# Patient Record
Sex: Female | Born: 1960 | Race: White | Hispanic: No
Health system: Midwestern US, Community
[De-identification: ages and names within clinical notes are randomized; demographics above are authoritative.]

## PROBLEM LIST (undated history)

## (undated) DIAGNOSIS — I5181 Takotsubo syndrome: Secondary | ICD-10-CM

## (undated) DIAGNOSIS — K219 Gastro-esophageal reflux disease without esophagitis: Secondary | ICD-10-CM

## (undated) DIAGNOSIS — J189 Pneumonia, unspecified organism: Secondary | ICD-10-CM

## (undated) DIAGNOSIS — I480 Paroxysmal atrial fibrillation: Secondary | ICD-10-CM

## (undated) DIAGNOSIS — I2510 Atherosclerotic heart disease of native coronary artery without angina pectoris: Secondary | ICD-10-CM

## (undated) DIAGNOSIS — F419 Anxiety disorder, unspecified: Secondary | ICD-10-CM

## (undated) DIAGNOSIS — F32A Depression, unspecified: Secondary | ICD-10-CM

## (undated) DIAGNOSIS — F1910 Other psychoactive substance abuse, uncomplicated: Secondary | ICD-10-CM

## (undated) HISTORY — PX: HERNIA REPAIR: SHX51

## (undated) HISTORY — PX: OTHER SURGICAL HISTORY: SHX169

## (undated) HISTORY — DX: Depression, unspecified: F32.A

## (undated) HISTORY — PX: CHOLECYSTECTOMY: SHX55

## (undated) HISTORY — DX: Anxiety disorder, unspecified: F41.9

## (undated) HISTORY — PX: WISDOM TOOTH EXTRACTION: SHX21

## (undated) HISTORY — PX: COLON RESECTION: SHX5231

## (undated) HISTORY — PX: MENISCUS REPAIR: SHX5179

---
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
## 2016-06-02 NOTE — Nursing Note (Signed)
Orthostatics - Text       Orthostatics Entered On:  06/02/2016 17:29 EDT    Performed On:  06/02/2016 17:26 EDT by Leanna BattlesMALONE, RN, JENNIFER L               Orthostatics   Systolic/Diastolic  Supine BP :   104 mmHg   Systolic/Diastolic  Supine BP :   56 mmHg (LOW)    Pulse Supine :   58 bpm (LOW)    Systolic/Diastolic  Sitting BP :   102 mmHg   Systolic/Diastolic  Sitting BP :   61 mmHg   Pulse Sitting :   63 bpm   MALONE, RN, JENNIFER L - 06/02/2016 17:26 EDT

## 2016-06-03 NOTE — Nursing Note (Signed)
Nursing Discharge Summary - Text       Nursing Discharge Summary Entered On:  06/03/2016 12:07 EDT    Performed On:  06/03/2016 12:06 EDT by Vinetta BergamoSTROCK, RN, Soyla DryerKRISTY M               DC Information   Discharge To, Anticipated :   Home with family support   Mode of Discharge :   Ambulatory   Transportation :   Private vehicle   Accompanied By :   Shela LeffFriend   STROCK, RN, KRISTY M - 06/03/2016 12:06 EDT   Education   Responsible Learner(s) :   Living Situation: Home independently        Performed by: Lanna PocheATWELL,  DOREEN A - 06/03/2016 07:03  Discharge To: Home independently, Other: private psychiatrist        Performed by: Lanna PocheATWELL,  DOREEN A - 06/03/2016 09:44     Barriers To Learning :   None evident   Teaching Method :   Explanation, Printed materials, Teach-back   STROCK, RN, Soyla DryerKRISTY M - 06/03/2016 12:06 EDT   Post-Hospital Education Adult Grid   Community Resources :   [employer] Squ[employer]bVerbalizes understanding   Disease Process :   Verbalizes understanding   Importance of Follow-Up Visits :   Verbalizes understanding   Plan of Care :   Verbalizes understanding   When to Call Health Care Provider :   BuchananVerbalizes understanding   NumaSTROCK, RN, Soyla DryerKRISTY M - 06/03/2016 12:06 EDT   Education Referral Made To :   Physician Specialist   Vinetta BergamoSTROCK, RN, KRISTY M - 06/03/2016 12:06 EDT

## 2016-06-03 NOTE — Case Communication (Signed)
 CM Discharge Planning Assessment - Text       CM Discharge Planning Assessment Entered On:  06/03/2016 7:12 EDT    Performed On:  06/03/2016 7:03 EDT by BERNETT GUT A               Home Environment   Living Environment :   No Living Environment Information Available     Affect/Behavior :   Cooperative   Living Situation :   Home independently   Special Serv & Comm Res, Anticipated :   Private Psychiatrist   Barriers at Home :   None   ATWELL,  DOREEN A - 06/03/2016 7:03 EDT   Home Environment   ADLs :   Independent   BERNETT GUT A - 06/03/2016 7:03 EDT   Discharge Needs I   Previously Documented Discharge Needs :   DISCHARGE PLAN/NEEDS:No discharge data available.  EQUIPMENT/TREATMENT NEEDS:No discharge data available.     Previously Documented Benefits Information :   No discharge data available.     CM Progress Note :   daa- 06/02/16 CM requested to assist with care needs. Pt. is alert and oriented, her speech is slurred and appears to be altered, taking multiple prescribed meds, denies taking an OD of any medications and that she is not suicidal nor has she been. She denies any HI, no A/VH or other psychotic sx. Pt. was very unbalanced and difficulty walking coming into the ER. She has been resting since and had to be awakened during this assessment. She came by sheriff from her psychiatrist office on both part 1 and part 2 committment papers completed. Papers note that pt. is not caring for herself and had fallen in the office parking lot prior to getting into the doctors office. No note of suicidality  and pt. denies. it is difficult to assess her due to her speech and ? influence of polysubstance use. She was given dinner tray which she did not want and she requested grilled chicken and grilled vegetables. Pt. is not forthright with information relating to her committment or sx. Info relayed to Dr. Lesia, dispo to be determined after seen by psychiatrist. I contacted [facility] since reference was made that  pt. was only here for medical clearence and than go to [facility]. [facility] Doug) is unaware of this pt. nor have any orders for adm.      BERNETT GUT A - 06/03/2016 7:03 EDT   Discharge Needs II   Discharge Planning Time Spent :   35 minutes   ATWELL,  DOREEN A - 06/03/2016 7:03 EDT   Advance Directive   *Advance Directive :   No   ATWELL,  DOREEN A - 06/03/2016 7:03 EDT

## 2016-06-03 NOTE — Case Communication (Signed)
 CM Discharge Planning Assessment - Text       CM Discharge Planning Ongoing Assessment Entered On:  06/03/2016 9:46 EDT    Performed On:  06/03/2016 9:44 EDT by BERNETT GUT A               Discharge Needs I   Previously Documented Discharge Needs :   DISCHARGE PLAN/NEEDS:  EQUIPMENT/TREATMENT NEEDS:       Previously Documented Benefits Information :   No discharge data available.     Discharge To :   Home independently, Other: private psychiatrist   CM Progress Note :   daa- 06/02/16 CM requested to assist with care needs. Pt. is alert and oriented, her speech is slurred and appears to be altered, taking multiple prescribed meds, denies taking an OD of any medications and that she is not suicidal nor has she been. She denies any HI, no A/VH or other psychotic sx. Pt. was very unbalanced and difficulty walking coming into the ER. She has been resting since and had to be awakened during this assessment. She came by sheriff from her psychiatrist office on both part 1 and part 2 committment papers completed. Papers note that pt. is not caring for herself and had fallen in the office parking lot prior to getting into the doctors office. No note of suicidality  and pt. denies. it is difficult to assess her due to her speech and ? influence of polysubstance use. She was given dinner tray which she did not want and she requested grilled chicken and grilled vegetables. Pt. is not forthright with information relating to her committment or sx. Info relayed to Dr. Lesia, dispo to be determined after seen by psychiatrist. I contacted [facility] since reference was made that pt. was only here for medical clearence and than go to [facility]. [facility] Doug) is unaware of this pt. nor have any orders for adm.   daa- Pt. was seen by Dr. Deanna, committment papers were cancelled, and he relayed to Dr. Daivd that if pt. eats 2 hard boiled eggs she can d/c home and will f/u with Dr. Marsa on outpt. basis.     ATWELL,  DOREEN A -  06/03/2016 9:44 EDT

## 2019-11-25 ENCOUNTER — Telehealth: Payer: Self-pay | Admitting: Family Medicine

## 2019-11-25 ENCOUNTER — Ambulatory Visit (INDEPENDENT_AMBULATORY_CARE_PROVIDER_SITE_OTHER): Payer: BLUE CROSS/BLUE SHIELD | Admitting: Family Medicine

## 2019-11-25 ENCOUNTER — Other Ambulatory Visit: Payer: Self-pay

## 2019-11-25 ENCOUNTER — Encounter: Payer: Self-pay | Admitting: Family Medicine

## 2019-11-25 VITALS — BP 88/56 | HR 77 | Temp 97.9°F | Ht 62.75 in | Wt 91.8 lb

## 2019-11-25 DIAGNOSIS — Z7689 Persons encountering health services in other specified circumstances: Secondary | ICD-10-CM

## 2019-11-25 DIAGNOSIS — I959 Hypotension, unspecified: Secondary | ICD-10-CM | POA: Diagnosis not present

## 2019-11-25 DIAGNOSIS — F3340 Major depressive disorder, recurrent, in remission, unspecified: Secondary | ICD-10-CM | POA: Diagnosis not present

## 2019-11-25 DIAGNOSIS — Z1231 Encounter for screening mammogram for malignant neoplasm of breast: Secondary | ICD-10-CM | POA: Diagnosis not present

## 2019-11-25 MED ORDER — QUETIAPINE FUMARATE 100 MG PO TABS: 100.0000 mg | ORAL_TABLET | Freq: Three times a day (TID) | ORAL | 0 refills | Status: DC

## 2019-11-25 MED ORDER — TRAZODONE HCL 50 MG PO TABS: 50.0000 mg | ORAL_TABLET | Freq: Two times a day (BID) | ORAL | 0 refills | Status: DC

## 2019-11-25 MED ORDER — TRAZODONE HCL 300 MG PO TABS: 300.0000 mg | ORAL_TABLET | Freq: Every day | ORAL | 0 refills | Status: DC

## 2019-11-25 NOTE — Progress Notes (Signed)
   Subjective:    Patient ID: Carla Martinez, female    DOB: 10-11-1961, 59 y.o.   MRN: [id number]  HPI Chief Complaint  Patient presents with  . New Patient (Initial Visit)    Moved here from [place], [place], was seen by Carla Coffin, MD and Dr Carla Martinez Psychiatrist.    She moved her recently, lives with her mother. Enjoys Therapist, art, exercise. Has 4 grandchildren.   Last CPE- Mammo- several years ago Pap- several years ago Colonoscopy- has had several, polyps removed, 3-5 yo follow up, unsure when last was Tdap- unknown Flu- unknown   Severe depression- 3 years ago after bad divorce, was put in assisted living. Was on many medications but currently just taking Seroquel and trazadone. Mood is "much better." She is functioning well. Poor sleep, takes trazadone 300 mg. Has difficulty staying asleep. Previously on naltrexone, paroxetine, buspirone, and mirtazapine according to [facility] from assisted living.     Review of Systems No headaches, no visual changes, no chest pain/ SOB, no abdominal pain, chronic constipation, good results with occasional laxative. No muscle or joint pain.     Objective:   Physical Exam Vitals reviewed.  Constitutional:      General: She is not in acute distress.    Appearance: Normal appearance. She is not ill-appearing, toxic-appearing or diaphoretic.     Comments: Thin.   HENT:     Head: Normocephalic and atraumatic.  Eyes:     Conjunctiva/sclera: Conjunctivae normal.  Cardiovascular:     Rate and Rhythm: Normal rate.  Pulmonary:     Effort: Pulmonary effort is normal.  Neurological:     Mental Status: She is alert and oriented to person, place, and time.  Psychiatric:        Mood and Affect: Mood normal.        Behavior: Behavior normal.        Thought Content: Thought content normal.        Judgment: Judgment normal.     BP (!) 88/56 (BP Location: Left Arm, Patient Position: Sitting, Cuff Size: Normal) Comment: pt states this is  normal for her  Pulse 77   Temp 97.9 F (36.6 C) (Temporal)   Ht 5' 2.75" (1.594 m)   Wt 91 lb 12.8 oz (41.6 kg)   SpO2 95%   BMI 16.39 kg/m      Assessment & Plan:  1. Encounter to establish care - unfortunately, unable to get records, patient unsure of prior providers - she was provided with information to schedule mammogram - follow up in 3 months for CPE/ labs  2. Recurrent major depressive disorder, in remission [facility]) - will have her see psychiatry. Depression improved according to patient but still scoring 11 on PHQ9.  - Ambulatory referral to Psychiatry - traZODone (DESYREL) 50 MG tablet; Take 1 tablet (50 mg total) by mouth 2 (two) times daily.  Dispense: 180 tablet; Refill: 0 - QUEtiapine (SEROQUEL) 100 MG tablet; Take 1 tablet (100 mg total) by mouth 3 (three) times daily.  Dispense: 270 tablet; Refill: 0 - traZODone (DESYREL) 300 MG tablet; Take 1 tablet (300 mg total) by mouth at bedtime. Take 100mg  BID and 300mg  at bedtime.  Dispense: 90 tablet; Refill: 0  3. Hypotension, unspecified hypotension type - no symptoms, per patient she always runs low   Clarene Reamer, FNP-BC  [facility], [place]  11/25/2019 12:56 PM

## 2019-11-25 NOTE — Telephone Encounter (Signed)
Carla Martinez, At the [facility] called in regards to the 2 trazodone prescriptions they received . They are requesting a call back to clarify the instructions and doses for both prescriptions   C/B [phone]

## 2019-11-25 NOTE — Patient Instructions (Addendum)
Please call and schedule an appointment for screening mammogram. A referral is not needed.  [phone]  Please schedule a follow up appointment for complete physical for 3 months

## 2019-11-25 NOTE — Telephone Encounter (Signed)
[place] and gave clarification of RX Pt takes 300mg  at bedtime and 50mg  BID  Meds corrected in chart.   Nothing further needed.

## 2020-02-20 ENCOUNTER — Other Ambulatory Visit: Payer: Self-pay

## 2020-02-20 DIAGNOSIS — F3340 Major depressive disorder, recurrent, in remission, unspecified: Secondary | ICD-10-CM

## 2020-02-20 NOTE — Telephone Encounter (Signed)
Please call patient and verify if she is out of medication? Prescriptions for 90 day supplies were sent in on 11/25/19. She should have enough until 02/23/20. She has an appointment with me on 02/22/20.

## 2020-02-20 NOTE — Telephone Encounter (Signed)
Patient contacted the office and is requesting a refill on Trazodone 50mg , Trazodone 300mg , and Seroquel 100mg . Rx's were last refilled on 11/25/19 - Patient was last seen in the office on 11/25/19 - and has an upcoming CPE on 02/22/20.  Ok to refill?

## 2020-02-20 NOTE — Telephone Encounter (Signed)
Patient called back about refill request She stated she is completely out of medication and would like a call once it is sent

## 2020-02-21 MED ORDER — TRAZODONE HCL 50 MG PO TABS: 50.0000 mg | ORAL_TABLET | Freq: Two times a day (BID) | ORAL | 0 refills | Status: DC

## 2020-02-21 MED ORDER — QUETIAPINE FUMARATE 100 MG PO TABS: 100.0000 mg | ORAL_TABLET | Freq: Three times a day (TID) | ORAL | 0 refills | Status: DC

## 2020-02-21 MED ORDER — TRAZODONE HCL 300 MG PO TABS: 300.0000 mg | ORAL_TABLET | Freq: Every day | ORAL | 0 refills | Status: DC

## 2020-02-21 NOTE — Telephone Encounter (Signed)
Pt states that she ran out of the Trazodone 300mg  Sunday night.  The Seroquel 100mg  and Trazodone 50mg  she only has a few tablets left.   Pt had to cancel the appt for 02/22/20 d/t not having her insurance - she is still waiting on her insurance information/card to come since January.   Pt is requesting that these be filled at least for a short period (30 day supply) until she can establish insurance and reschedule her CPE with Debbie.

## 2020-02-22 ENCOUNTER — Encounter: Payer: BLUE CROSS/BLUE SHIELD | Admitting: Family Medicine

## 2020-03-20 ENCOUNTER — Other Ambulatory Visit: Payer: Self-pay

## 2020-03-20 DIAGNOSIS — F3340 Major depressive disorder, recurrent, in remission, unspecified: Secondary | ICD-10-CM

## 2020-03-20 MED ORDER — TRAZODONE HCL 300 MG PO TABS: 300.0000 mg | ORAL_TABLET | Freq: Every day | ORAL | 0 refills | Status: DC

## 2020-03-20 MED ORDER — QUETIAPINE FUMARATE 100 MG PO TABS: 100.0000 mg | ORAL_TABLET | Freq: Three times a day (TID) | ORAL | 0 refills | Status: DC

## 2020-03-20 MED ORDER — TRAZODONE HCL 50 MG PO TABS: 50.0000 mg | ORAL_TABLET | Freq: Two times a day (BID) | ORAL | 0 refills | Status: DC

## 2020-03-20 NOTE — Telephone Encounter (Signed)
Patient contacted the office and states she needs a refill on her Trazadone and her Seroquel. She states that she is still battling with her insurance company about getting her information and all, and she just got out of a guardianship/conservatorship situation - and she is unable to make an appt right now.  She would like these sent in to [facility] on [address].

## 2020-03-22 NOTE — Telephone Encounter (Signed)
LM for return call

## 2020-04-13 ENCOUNTER — Ambulatory Visit: Payer: BLUE CROSS/BLUE SHIELD | Admitting: Family Medicine

## 2020-04-18 ENCOUNTER — Other Ambulatory Visit: Payer: Self-pay | Admitting: Family Medicine

## 2020-04-18 DIAGNOSIS — F3340 Major depressive disorder, recurrent, in remission, unspecified: Secondary | ICD-10-CM

## 2020-04-19 NOTE — Telephone Encounter (Signed)
Pt has an appt 04/20/20  Will send to [facility] to address at [place]

## 2020-04-20 ENCOUNTER — Other Ambulatory Visit: Payer: Self-pay

## 2020-04-20 ENCOUNTER — Ambulatory Visit (INDEPENDENT_AMBULATORY_CARE_PROVIDER_SITE_OTHER): Payer: 59 | Admitting: Family Medicine

## 2020-04-20 ENCOUNTER — Encounter: Payer: Self-pay | Admitting: Family Medicine

## 2020-04-20 VITALS — BP 90/58 | HR 87

## 2020-04-20 DIAGNOSIS — R636 Underweight: Secondary | ICD-10-CM | POA: Diagnosis not present

## 2020-04-20 DIAGNOSIS — F330 Major depressive disorder, recurrent, mild: Secondary | ICD-10-CM | POA: Insufficient documentation

## 2020-04-20 DIAGNOSIS — F1011 Alcohol abuse, in remission: Secondary | ICD-10-CM

## 2020-04-20 DIAGNOSIS — F3340 Major depressive disorder, recurrent, in remission, unspecified: Secondary | ICD-10-CM | POA: Diagnosis not present

## 2020-04-20 DIAGNOSIS — F322 Major depressive disorder, single episode, severe without psychotic features: Secondary | ICD-10-CM | POA: Insufficient documentation

## 2020-04-20 MED ORDER — TRAZODONE HCL 300 MG PO TABS: 300.0000 mg | ORAL_TABLET | Freq: Every day | ORAL | 1 refills | Status: DC

## 2020-04-20 MED ORDER — TRAZODONE HCL 50 MG PO TABS: 50.0000 mg | ORAL_TABLET | Freq: Two times a day (BID) | ORAL | 1 refills | Status: DC
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
## 2020-04-20 NOTE — Patient Instructions (Addendum)
Please call and schedule an appointment for screening mammogram. A referral is not needed.  [phone]  Try a stool softener (Colace) for constipation

## 2020-05-03 ENCOUNTER — Telehealth: Payer: Self-pay

## 2020-05-03 NOTE — Telephone Encounter (Signed)
Pt needs to know where she can go to see a certified addiction specialist for a driving assessment. She has called several places but has had no luck. Please advise at [phone]

## 2020-05-03 NOTE — Telephone Encounter (Signed)
I recommend she call [facility] where the requirements have originated and see if they can give her any guidance. From the documentation that she had at her recent visit, it was very specific with the type of professional she needs to be evaluated by.

## 2020-05-07 NOTE — Telephone Encounter (Signed)
Noted  

## 2020-05-07 NOTE — Telephone Encounter (Signed)
FYI, the patient is going to do this virtually with her Psychiatrist from [facility]   Nothing further needed.

## 2020-06-16 ENCOUNTER — Other Ambulatory Visit: Payer: Self-pay | Admitting: Family Medicine

## 2020-06-16 DIAGNOSIS — F3340 Major depressive disorder, recurrent, in remission, unspecified: Secondary | ICD-10-CM

## 2020-06-19 MED ORDER — QUETIAPINE FUMARATE 100 MG PO TABS: 100.0000 mg | ORAL_TABLET | Freq: Three times a day (TID) | ORAL | 0 refills | Status: DC

## 2020-06-19 NOTE — Telephone Encounter (Signed)
Pt states she also needs a refill on her Seroquel.

## 2020-06-22 ENCOUNTER — Ambulatory Visit: Payer: BLUE CROSS/BLUE SHIELD | Admitting: Family Medicine

## 2020-07-09 ENCOUNTER — Telehealth: Payer: Self-pay | Admitting: Family Medicine

## 2020-07-09 NOTE — Telephone Encounter (Signed)
Patient called in stating she would like to discuss mammogram order/location. Please advise.

## 2020-07-10 NOTE — Telephone Encounter (Signed)
Called pt and left message on her vmail to call office and ask to speak to Santiago Glad.

## 2020-07-10 NOTE — Telephone Encounter (Signed)
Pt returned your call Best number [phone]

## 2020-07-10 NOTE — Telephone Encounter (Signed)
Noted  

## 2020-07-10 NOTE — Telephone Encounter (Signed)
I attempted to reach patient to discuss this. Lvm asking her to call the office.

## 2020-07-10 NOTE — Telephone Encounter (Signed)
Please call her and find out what her questions are. Does she need a different location?

## 2020-07-10 NOTE — Telephone Encounter (Signed)
Pt called in wanting to know if she needed a referral. I informed pt that she did not need a referral and I provided her with the name and phone number to [facility].    Pt also have concerns about having to pay her bill in full because she received a bill from [employer] stating that insurance didn't cover. Pt stated that she spoke to [employer] and they informed her that her deductible is $50, her bill is covered by [employer].  I verified the insurance company and will route to [employer].

## 2020-07-22 ENCOUNTER — Other Ambulatory Visit: Payer: Self-pay | Admitting: Family Medicine

## 2020-07-22 DIAGNOSIS — F3340 Major depressive disorder, recurrent, in remission, unspecified: Secondary | ICD-10-CM

## 2020-07-24 NOTE — Telephone Encounter (Signed)
Patient is completely out of seroquel, trazodone 300mg  and 50mg . She needs this medication called today.

## 2020-07-25 MED ORDER — QUETIAPINE FUMARATE 100 MG PO TABS: 100.0000 mg | ORAL_TABLET | Freq: Three times a day (TID) | ORAL | 0 refills | Status: DC

## 2020-07-25 NOTE — Telephone Encounter (Signed)
Last appt 04/20/2020 AVS says for pt to f/u 3 to 4 months CPE.  No future appt. Last refill 06/19/2020.  Refill?

## 2020-07-25 NOTE — Telephone Encounter (Signed)
My error. Last prescription was for 30 day supply.

## 2020-07-25 NOTE — Addendum Note (Signed)
Addended by: Carter Kitten on: 07/25/2020 03:48 PM   Modules accepted: Orders

## 2020-07-25 NOTE — Telephone Encounter (Signed)
Carla Martinez, Patient takes Seroquel 3 times a day so she would be due for refill?  Please advise.

## 2020-07-25 NOTE — Telephone Encounter (Signed)
Please call patient and tell her that she should still have seroquel. I sent in a 90 day supply at the end of August. I am sending in a one month prescription for her trazodone, she needs a follow up appointment. Please schedule her.

## 2020-07-26 NOTE — Telephone Encounter (Signed)
Left message for Carla Martinez that Carla Martinez refilled her medication but she is due for a follow up.  I ask that she call the office to schedule appointment prior to her next refill.

## 2020-08-01 ENCOUNTER — Ambulatory Visit: Payer: 59 | Admitting: Family Medicine

## 2020-08-17 ENCOUNTER — Ambulatory Visit (INDEPENDENT_AMBULATORY_CARE_PROVIDER_SITE_OTHER): Payer: 59 | Admitting: Family Medicine

## 2020-08-17 ENCOUNTER — Encounter: Payer: Self-pay | Admitting: Family Medicine

## 2020-08-17 ENCOUNTER — Other Ambulatory Visit: Payer: Self-pay

## 2020-08-17 VITALS — BP 82/64 | HR 66 | Temp 97.8°F | Ht 62.5 in | Wt 92.0 lb

## 2020-08-17 DIAGNOSIS — Z1231 Encounter for screening mammogram for malignant neoplasm of breast: Secondary | ICD-10-CM

## 2020-08-17 DIAGNOSIS — R636 Underweight: Secondary | ICD-10-CM | POA: Diagnosis not present

## 2020-08-17 DIAGNOSIS — I959 Hypotension, unspecified: Secondary | ICD-10-CM | POA: Diagnosis not present

## 2020-08-17 DIAGNOSIS — F3340 Major depressive disorder, recurrent, in remission, unspecified: Secondary | ICD-10-CM | POA: Diagnosis not present

## 2020-08-17 MED ORDER — TRAZODONE HCL 300 MG PO TABS: 300.0000 mg | ORAL_TABLET | Freq: Every day | ORAL | 1 refills | Status: DC

## 2020-08-17 MED ORDER — QUETIAPINE FUMARATE 100 MG PO TABS
100.0000 mg | ORAL_TABLET | Freq: Three times a day (TID) | ORAL | 1 refills | Status: DC
Start: 2020-08-17 — End: 2021-03-21

## 2020-08-17 MED ORDER — TRAZODONE HCL 50 MG PO TABS
50.0000 mg | ORAL_TABLET | Freq: Two times a day (BID) | ORAL | 1 refills | Status: DC
Start: 2020-08-17 — End: 2021-03-14

## 2020-08-17 NOTE — Patient Instructions (Signed)
Good to see you today  Please schedule mammogram  Hang in there with everything you have going on
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
## 2020-08-18 LAB — COMPREHENSIVE METABOLIC PANEL
AG Ratio: 2 (calc) (ref 1.0–2.5)
ALT: 15 U/L (ref 6–29)
AST: 17 U/L (ref 10–35)
Albumin: 4.3 g/dL (ref 3.6–5.1)
Alkaline phosphatase (APISO): 65 U/L (ref 37–153)
BUN/Creatinine Ratio: 15 (calc) (ref 6–22)
BUN: 19 mg/dL (ref 7–25)
CO2: 31 mmol/L (ref 20–32)
Calcium: 9.8 mg/dL (ref 8.6–10.4)
Chloride: 93 mmol/L — ABNORMAL LOW (ref 98–110)
Creat: 1.3 mg/dL — ABNORMAL HIGH (ref 0.50–1.05)
Globulin: 2.2 g/dL (calc) (ref 1.9–3.7)
Glucose, Bld: 95 mg/dL (ref 65–99)
Potassium: 3.6 mmol/L (ref 3.5–5.3)
Sodium: 138 mmol/L (ref 135–146)
Total Bilirubin: 0.4 mg/dL (ref 0.2–1.2)
Total Protein: 6.5 g/dL (ref 6.1–8.1)

## 2020-08-18 LAB — CBC WITH DIFFERENTIAL/PLATELET
Absolute Monocytes: 480 cells/uL (ref 200–950)
Basophils Absolute: 32 cells/uL (ref 0–200)
Basophils Relative: 0.4 %
Eosinophils Absolute: 72 cells/uL (ref 15–500)
Eosinophils Relative: 0.9 %
HCT: 36.9 % (ref 35.0–45.0)
Hemoglobin: 13 g/dL (ref 11.7–15.5)
Lymphs Abs: 3808 cells/uL (ref 850–3900)
MCH: 33.2 pg — ABNORMAL HIGH (ref 27.0–33.0)
MCHC: 35.2 g/dL (ref 32.0–36.0)
MCV: 94.4 fL (ref 80.0–100.0)
MPV: 9.3 fL (ref 7.5–12.5)
Monocytes Relative: 6 %
Neutro Abs: 3608 cells/uL (ref 1500–7800)
Neutrophils Relative %: 45.1 %
Platelets: 343 10*3/uL (ref 140–400)
RBC: 3.91 10*6/uL (ref 3.80–5.10)
RDW: 12 % (ref 11.0–15.0)
Total Lymphocyte: 47.6 %
WBC: 8 10*3/uL (ref 3.8–10.8)

## 2020-08-18 LAB — TSH: TSH: 1.64 mIU/L (ref 0.40–4.50)

## 2020-08-22 ENCOUNTER — Telehealth: Payer: Self-pay | Admitting: Family Medicine

## 2020-08-22 NOTE — Telephone Encounter (Signed)
Pt returned your call.  

## 2020-08-22 NOTE — Telephone Encounter (Signed)
lvm

## 2020-08-27 NOTE — Telephone Encounter (Signed)
lvm

## 2020-08-28 NOTE — Telephone Encounter (Signed)
Pt called, provided lab results (pt verbalized understanding),  scheduled 6 month follow up visit with Tor Netters.

## 2020-09-03 ENCOUNTER — Ambulatory Visit: Payer: 59 | Admitting: Family Medicine

## 2020-10-04 ENCOUNTER — Telehealth: Payer: Self-pay | Admitting: Family Medicine

## 2020-10-04 NOTE — Telephone Encounter (Signed)
Error

## 2020-10-05 ENCOUNTER — Encounter: Payer: Self-pay | Admitting: Family Medicine

## 2020-10-05 ENCOUNTER — Telehealth (INDEPENDENT_AMBULATORY_CARE_PROVIDER_SITE_OTHER): Payer: Managed Care, Other (non HMO) | Admitting: Family Medicine

## 2020-10-05 ENCOUNTER — Other Ambulatory Visit: Payer: Self-pay

## 2020-10-05 DIAGNOSIS — Z789 Other specified health status: Secondary | ICD-10-CM

## 2020-10-05 DIAGNOSIS — Z7289 Other problems related to lifestyle: Secondary | ICD-10-CM

## 2020-10-05 NOTE — Progress Notes (Signed)
Virtual Visit via Video Note  I connected with Carla Martinez on 10/05/20 at 12:15 PM EST by a video enabled telemedicine application and verified that I am speaking with the correct person using two identifiers.  There is difficulty with connection so visit was completed via telephone only.  Location: Patient: At her home Provider: [place] Persons participating in virtual visit: Patient, provider   I discussed the limitations of evaluation and management by telemedicine and the availability of in person appointments. The patient expressed understanding and agreed to proceed.  History of Present Illness: This is a 60 year old female who presents today for virtual visit to discuss alcohol use. She  has a past medical history of Anxiety.  She requests virtual visit today to discuss alcohol use. She reports that she has recently increased her consumption to up to 4 glasses of wine a day. Over the last several months, she was having a glass "here or there." over the last week has had 2-3 glasses a night with up to 4 glasses off and on. She denies drinking alcohol in the morning. Today she reports that she has not had any alcohol and is worried about withdrawal symptoms as she feels jittery and her heart is racing. She has a history of anxiety and depression. She was previously established with a psychiatrist in [facility] but has not established with a psychiatrist locally. She is requesting medication for alcohol withdrawal. According to the patient, she is unable to seek any help because she is taking care of her adult, special needs son. She also lives with her mother but she is out of town and "no one can find out about this." She has been maintained on seroquel and trazodone for her anxiety and depression. Stress level is with taking care of her son and dealing with her mother and ex husband.    Observations/Objective: Patient is alert and answers questions appropriately. She is normally  conversive. Mood and affect are appropriate.  There were no vitals taken for this visit. BP Readings from Last 3 Encounters:  08/17/20 (!) 82/64  04/20/20 (!) 90/58  11/25/19 (!) 88/56   Wt Readings from Last 3 Encounters:  08/17/20 92 lb (41.7 kg)  11/25/19 91 lb 12.8 oz (41.6 kg)    Assessment and Plan: 1. Alcohol use - discussed use with patient, available resources- community mental health urgent care, ER. Patient reports that she just wants some medication. Reviewed her current medications and concern for adding anything at this point, prefer that any additional medication be managed through psychiatry - she has not been drinking large amounts for very long, sounds like less than a week of up to 4 glasses of wine a day, discussed tapering over two weeks - she plans to follow up with her psychiatrist in [facility] - advised her to contact office if needed   Clarene Reamer, FNP-BC  [facility] at [facility], [place]  10/09/2020 6:03 AM   Follow Up Instructions:    I discussed the assessment and treatment plan with the patient. The patient was provided an opportunity to ask questions and all were answered. The patient agreed with the plan and demonstrated an understanding of the instructions.   The patient was advised to call back or seek an in-person evaluation if the symptoms worsen or if the condition fails to improve as anticipated.    Elby Beck, FNP

## 2020-10-09 ENCOUNTER — Encounter: Payer: Self-pay | Admitting: Family Medicine

## 2020-10-31 ENCOUNTER — Telehealth: Payer: Self-pay

## 2020-10-31 NOTE — Telephone Encounter (Signed)
Pt said [facility] was sending over a request for traZODone (DESYREL) 50 MG tablet. She said this was a few days ago and she was wondering if we received the request.

## 2020-11-02 NOTE — Telephone Encounter (Signed)
Pt returned call. Advised refills are at the pharmacy. Pt verbalized understanding.

## 2020-11-02 NOTE — Telephone Encounter (Signed)
Pt should not need refills. Last scripts sent in on 08/17/20 for 90 day supply with 1 RF. She should have enough until April. She should have a refill at [facility]. Pt needs to contact [facility] to request a refill.   LVM for pt to return call.

## 2021-01-31 ENCOUNTER — Telehealth: Payer: Self-pay

## 2021-01-31 NOTE — Telephone Encounter (Signed)
Agree with evaluation if pain severe and difficulty grasping and not popping blister.
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
## 2021-02-12 ENCOUNTER — Ambulatory Visit: Payer: Self-pay | Admitting: Family Medicine

## 2021-02-14 ENCOUNTER — Other Ambulatory Visit: Payer: Self-pay

## 2021-02-14 ENCOUNTER — Ambulatory Visit (INDEPENDENT_AMBULATORY_CARE_PROVIDER_SITE_OTHER): Payer: No Typology Code available for payment source | Admitting: Family Medicine

## 2021-02-14 ENCOUNTER — Encounter: Payer: Self-pay | Admitting: Family Medicine

## 2021-02-14 VITALS — BP 98/62 | HR 71 | Temp 97.8°F | Ht 63.0 in | Wt 96.0 lb

## 2021-02-14 DIAGNOSIS — M545 Low back pain, unspecified: Secondary | ICD-10-CM | POA: Insufficient documentation

## 2021-02-14 DIAGNOSIS — R7989 Other specified abnormal findings of blood chemistry: Secondary | ICD-10-CM | POA: Diagnosis not present

## 2021-02-14 DIAGNOSIS — R140 Abdominal distension (gaseous): Secondary | ICD-10-CM | POA: Insufficient documentation

## 2021-02-14 DIAGNOSIS — F5000 Anorexia nervosa, unspecified: Secondary | ICD-10-CM

## 2021-02-14 DIAGNOSIS — K219 Gastro-esophageal reflux disease without esophagitis: Secondary | ICD-10-CM

## 2021-02-14 DIAGNOSIS — T23252A Burn of second degree of left palm, initial encounter: Secondary | ICD-10-CM

## 2021-02-14 DIAGNOSIS — F330 Major depressive disorder, recurrent, mild: Secondary | ICD-10-CM

## 2021-02-14 DIAGNOSIS — R635 Abnormal weight gain: Secondary | ICD-10-CM

## 2021-02-14 DIAGNOSIS — R1032 Left lower quadrant pain: Secondary | ICD-10-CM

## 2021-02-14 LAB — POC URINALSYSI DIPSTICK (AUTOMATED)
Bilirubin, UA: NEGATIVE
Blood, UA: NEGATIVE
Glucose, UA: NEGATIVE
Ketones, UA: NEGATIVE
Leukocytes, UA: NEGATIVE
Nitrite, UA: NEGATIVE
Protein, UA: NEGATIVE
Spec Grav, UA: 1.01
Urobilinogen, UA: 0.2 U/dL
pH, UA: 7

## 2021-02-14 NOTE — Assessment & Plan Note (Signed)
Stable on prn medication

## 2021-02-14 NOTE — Assessment & Plan Note (Signed)
Normal back exam and abdominal exam. Unable to illicit pain. Discussed possible constipation contributing. Will start with labs. Discussed low back strains typically resolve w/in 6 weeks and if persisting return
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
## 2021-02-14 NOTE — Patient Instructions (Addendum)
Back pain/stomach/weight gain - labs today - may consider X-ray for constipation    Call if wanting to see psychiatry   Constipation   Constipation is a common issue. Often it is related to diet and occasionally medications.   What you can do to treat your symptoms 1) Fiber -- Eat more fiber rich foods: beans, broccoli, berries, avocados, popcorn, pear/apple, green peas, turnip greens, brussels sprouts, whole grains (barley, bran, quinoa, oatmeal) -- Take a Fiber supplement: Psyllium (Metamucil)  -- Could also eat Prunes daily  2) Hydration  -- Drink more water: Try to drink 64 oz of water per day  3) Exercise -- Moderate exercise (walking, jogging, biking) for 30 minutes, 5 days a week  4) Dedicate time for Bowel movements - do not delay  5) Stool Softener  - Docusate Sodium (Colace) 100 mg daily or twice daily as needed   If 4-6 weeks have passed and the above has not helped then start the following 6) Laxatives -- Polyethylene Glycol (Miralax) - begin with once daily. After a few days can increase to twice daily Or -- Magnesium Citrate -- Common side effect is nausea and diarrhea -- can try if still not improved  If still not improved after 4-6 weeks 7) Stimulant Laxatives: can also be an option if doing above treatment and no bowel movement for 3 days --- Bisacodyl (Dulcolax) 5-15 mg daily --- Sennosides (Senokot)  Treating chronic constipation is often about finding the right amount of medication and fiber to keep you regular and comfortable. For some people that may be daily metamucil and colace every other day. For others it may be Metamucil and colace twice daily and Miralax 3 times a week. The goal is to go slow and listen to your body. And normal can be anywhere from 2-3 soft bowel movements a day to 1 bowel movement every 2-3 days.

## 2021-02-14 NOTE — Assessment & Plan Note (Signed)
Stable but at risk for worsening. Pt notes recent 4-6 lb with gain. BMI still underweight. Using laxatives 2-3 times a week for constipation and seems to be eating <1000 calories per day (though notes this is improved on all accounts). Main concern is stomach enlargement. Discussed referral for support - she does not want treatment or referral at this time.

## 2021-02-14 NOTE — Assessment & Plan Note (Addendum)
Poorly controlled. Was previously seeing psych in [facility] and has not established with one locally. Offered referral due to atypical regimen and comorbid anorexia would prefer psych support in management and she will consider but declined today.

## 2021-02-14 NOTE — Assessment & Plan Note (Signed)
Suspect possible constipation vs msk with back pain. Offered KUB with labs and she declined. Hx of bowel resection so could be strictures though abdominal exam benign. Will get labs. Consider ct scan if no improvement/worsening. Advised more gentle constipation management and eating more calories.

## 2021-02-15 LAB — CBC WITH DIFFERENTIAL/PLATELET
Basophils Absolute: 0.1 10*3/uL (ref 0.0–0.1)
Basophils Relative: 1 % (ref 0.0–3.0)
Eosinophils Absolute: 0.2 10*3/uL (ref 0.0–0.7)
Eosinophils Relative: 2.3 % (ref 0.0–5.0)
HCT: 39.4 % (ref 36.0–46.0)
Hemoglobin: 13.8 g/dL (ref 12.0–15.0)
Lymphocytes Relative: 33.7 % (ref 12.0–46.0)
Lymphs Abs: 2.9 10*3/uL (ref 0.7–4.0)
MCHC: 35 g/dL (ref 30.0–36.0)
MCV: 97.8 fl (ref 78.0–100.0)
Monocytes Absolute: 0.6 10*3/uL (ref 0.1–1.0)
Monocytes Relative: 7.2 % (ref 3.0–12.0)
Neutro Abs: 4.8 10*3/uL (ref 1.4–7.7)
Neutrophils Relative %: 55.8 % (ref 43.0–77.0)
Platelets: 336 10*3/uL (ref 150.0–400.0)
RBC: 4.03 Mil/uL (ref 3.87–5.11)
RDW: 13.5 % (ref 11.5–15.5)
WBC: 8.5 10*3/uL (ref 4.0–10.5)

## 2021-02-15 LAB — COMPREHENSIVE METABOLIC PANEL
ALT: 13 U/L (ref 0–35)
AST: 16 U/L (ref 0–37)
Albumin: 4.4 g/dL (ref 3.5–5.2)
Alkaline Phosphatase: 67 U/L (ref 39–117)
BUN: 15 mg/dL (ref 6–23)
CO2: 34 mEq/L — ABNORMAL HIGH (ref 19–32)
Calcium: 10.1 mg/dL (ref 8.4–10.5)
Chloride: 92 mEq/L — ABNORMAL LOW (ref 96–112)
Creatinine, Ser: 1.04 mg/dL (ref 0.40–1.20)
GFR: 59.71 mL/min — ABNORMAL LOW (ref >60.00)
Glucose, Bld: 97 mg/dL (ref 70–99)
Potassium: 3.4 mEq/L — ABNORMAL LOW (ref 3.5–5.1)
Sodium: 136 mEq/L (ref 135–145)
Total Bilirubin: 0.3 mg/dL (ref 0.2–1.2)
Total Protein: 6.7 g/dL (ref 6.0–8.3)

## 2021-02-15 LAB — TSH: TSH: 1.29 u[IU]/mL (ref 0.35–4.50)

## 2021-02-17 DIAGNOSIS — Z634 Disappearance and death of family member: Secondary | ICD-10-CM

## 2021-02-17 HISTORY — DX: Disappearance and death of family member: Z63.4

## 2021-02-19 ENCOUNTER — Telehealth: Payer: Self-pay

## 2021-02-19 NOTE — Telephone Encounter (Signed)
I would recommend she schedule an annual exam to get her up to date on cancer screening - no records of recent pap smear or colon cancer screening.   We should start with this work-up to rule out cancer and can consider additional work-up at the visit.

## 2021-02-19 NOTE — Telephone Encounter (Signed)
Pt left v/m that she got the test results back today and everything looks normal but pt wants to know why she is so bloated and looks 6 mths pregnant; abd is hard and firm. Pt having a lot of pain in lt kidney area and radiates to front of abd. Pt request cb. Sending note to DR Einar Pheasant and [place] CMA.

## 2021-02-26 NOTE — Telephone Encounter (Signed)
Pt called in and scheduled a follow up with [facility]. Will put in notes to schedule CPE at that appt

## 2021-02-27 ENCOUNTER — Other Ambulatory Visit: Payer: Self-pay

## 2021-02-27 ENCOUNTER — Ambulatory Visit (INDEPENDENT_AMBULATORY_CARE_PROVIDER_SITE_OTHER): Payer: No Typology Code available for payment source | Admitting: Family Medicine

## 2021-02-27 ENCOUNTER — Encounter: Payer: Self-pay | Admitting: Family Medicine

## 2021-02-27 VITALS — BP 98/68 | HR 69 | Temp 97.9°F | Wt 95.0 lb

## 2021-02-27 DIAGNOSIS — M810 Age-related osteoporosis without current pathological fracture: Secondary | ICD-10-CM

## 2021-02-27 DIAGNOSIS — R1032 Left lower quadrant pain: Secondary | ICD-10-CM | POA: Diagnosis not present

## 2021-02-27 DIAGNOSIS — Z9049 Acquired absence of other specified parts of digestive tract: Secondary | ICD-10-CM | POA: Diagnosis not present

## 2021-02-27 DIAGNOSIS — R140 Abdominal distension (gaseous): Secondary | ICD-10-CM | POA: Diagnosis not present

## 2021-02-27 DIAGNOSIS — Z1231 Encounter for screening mammogram for malignant neoplasm of breast: Secondary | ICD-10-CM

## 2021-02-27 MED ORDER — DICYCLOMINE HCL 10 MG PO CAPS: 10.0000 mg | ORAL_CAPSULE | Freq: Three times a day (TID) | ORAL | 0 refills | Status: DC

## 2021-02-27 NOTE — Patient Instructions (Addendum)
Please call the location of your choice from the menu below to schedule your Mammogram and/or Bone Density appointment.     [place]  [address]. [place] at [facility]   Phone:  [phone]   [place], [place] [address]                                            Services: 3D Mammogram and Bone Density    Would recommend the following - for Bone health:   1) 800 units of Vitamin D daily 2) Get 1200 mg of elemental calcium --- this is best from your diet. Try to track how much calcium you get on a typical day. You could find ways to add more (dairy products, leafy greens). Take a supplement for whatever you don't typically get so you reach 1200 mg of calcium.  3) Physical activity (ideally weight bearing) - like walking briskly 30 minutes 5 days a week.    You will get a phone call about scheduling the CT Scan  If we are unable to get records or don't find anything on CT then I will refer to GI

## 2021-02-27 NOTE — Assessment & Plan Note (Addendum)
Persistent pain and now with watery diarrhea in pt s/p bowel resection and unclear GI history. Will obtain GI records - reports polyp in the past but not sure when this was or when her last colon cancer screening was done or where. Given surgical history with persistent bloating will start with CT scan. Trial of bentyl to help with pain. Also discussed need for updated cancer screening. If negative may consider pelvic US after pap smear to evaluate for ovarian lesion.

## 2021-02-27 NOTE — Progress Notes (Signed)
Subjective:     Carla Martinez is a 60 y.o. female presenting for Follow-up (Abdominal bloating and L flank pain  x 2 months )     HPI  #Bloating - no change in symptoms - continues to have bloating - last BM - having watery dark stools w/o blood - one large BM and 2 small BM - has avoided laxatives and still with watery stools - no blood, no mucus - low appetite due to the bloating and stomach increase  Supplments: mag, potassium, tumeric, multivitamin, supper B, biotin  LMP - never had regular cycles - last one was years ago Still has uterus Not sure when last pap smear was   Treatment: tylenol, ibuprofen, wine, nyquil for nighttime, heat w/o improvement  #Flank pain - continues to have low back pain which radiates to the front on the left side - no pelvic pain and pressure - sensation of rock in the abdomen  #Colonoscopy  - [facility] [place] and Dr. Margie Billet  - thinks w/in the last 5 years - not sure where - separated from husband, had health issues and was in assisted living and thinks that she was getting    Review of Systems   Social History   Tobacco Use  Smoking Status Current Every Day Smoker  . Packs/day: 0.25  . Years: 17.00  . Pack years: 4.25  . Types: Cigarettes  . Start date: 11/25/2003  Smokeless Tobacco Never Used  Tobacco Comment   used to smoke 1PPD but has decreased.        Objective:    BP Readings from Last 3 Encounters:  02/27/21 98/68  02/14/21 98/62  08/17/20 (!) 82/64   Wt Readings from Last 3 Encounters:  02/27/21 95 lb (43.1 kg)  02/14/21 96 lb (43.5 kg)  08/17/20 92 lb (41.7 kg)    BP 98/68   Pulse 69   Temp 97.9 F (36.6 C) (Temporal)   Wt 95 lb (43.1 kg)   SpO2 96%   BMI 16.83 kg/m    Physical Exam Constitutional:      General: She is not in acute distress.    Appearance: She is well-developed. She is not diaphoretic.  HENT:     Right Ear: External ear normal.     Left  Ear: External ear normal.     Nose: Nose normal.  Eyes:     Conjunctiva/sclera: Conjunctivae normal.  Cardiovascular:     Rate and Rhythm: Normal rate and regular rhythm.     Heart sounds: No murmur heard.   Pulmonary:     Effort: Pulmonary effort is normal. No respiratory distress.     Breath sounds: Normal breath sounds. No wheezing.  Abdominal:     General: Bowel sounds are increased. There is distension (w/ standing).     Palpations: Abdomen is soft. There is no hepatomegaly or splenomegaly.     Tenderness: There is no abdominal tenderness. There is no right CVA tenderness, left CVA tenderness, guarding or rebound. Negative signs include Murphy's sign.     Hernia: No hernia is present.     Comments: Thin pt but when standing her stomach has obvious protrusion though flat when laying down  Musculoskeletal:     Cervical back: Neck supple.  Skin:    General: Skin is warm and dry.     Capillary Refill: Capillary refill takes less than 2 seconds.  Neurological:     Mental Status: She is alert.  Mental status is at baseline.  Psychiatric:        Mood and Affect: Mood normal.        Behavior: Behavior normal.           Assessment & Plan:   Problem List Items Addressed This Visit      Musculoskeletal and Integument   Osteoporosis    Diagnosed at age 59. Not interested in repeat at this time. Likely 2/2 to anorexia. Advised calcium and vitamin D supplement.         Other   Left lower quadrant abdominal pain - Primary    Persistent pain and now with watery diarrhea in pt s/p bowel resection and unclear GI history. Will obtain GI records - reports polyp in the past but not sure when this was or when her last colon cancer screening was done or where. Given surgical history with persistent bloating will start with CT scan. Trial of bentyl to help with pain. Also discussed need for updated cancer screening. If negative may consider pelvic US after pap smear to evaluate for ovarian  lesion.       Relevant Medications   dicyclomine (BENTYL) 10 MG capsule   Other Relevant Orders   CT Abdomen Pelvis Wo Contrast   History of bowel resection   Relevant Medications   dicyclomine (BENTYL) 10 MG capsule   Other Relevant Orders   CT Abdomen Pelvis Wo Contrast    Other Visit Diagnoses    Abdominal distension (gaseous)       Relevant Medications   dicyclomine (BENTYL) 10 MG capsule   Other Relevant Orders   CT Abdomen Pelvis Wo Contrast   Encounter for screening mammogram for malignant neoplasm of breast       Relevant Orders   MM Digital Screening       Return for schedule annual for pap smear.  [name], MD  This visit occurred during the SARS-CoV-2 public health emergency.  Safety protocols were in place, including screening questions prior to the visit, additional usage of staff PPE, and extensive cleaning of exam room while observing appropriate contact time as indicated for disinfecting solutions.

## 2021-02-27 NOTE — Assessment & Plan Note (Addendum)
Diagnosed at age 60. Not interested in repeat at this time. Likely 2/2 to anorexia. Advised calcium and vitamin D supplement.

## 2021-03-04 ENCOUNTER — Telehealth: Payer: Self-pay | Admitting: *Deleted

## 2021-03-04 DIAGNOSIS — R1032 Left lower quadrant pain: Secondary | ICD-10-CM

## 2021-03-04 MED ORDER — MELOXICAM 7.5 MG PO TABS: 7.5000 mg | ORAL_TABLET | Freq: Every day | ORAL | 0 refills | Status: DC

## 2021-03-04 NOTE — Telephone Encounter (Signed)
Patient called stating that she was given medication for her abdominal pain and the medication is not working. Patient stated that she needs something stronger. Patient stated that she  Is scheduled for a CT scan of her abdomen 03/13/21 but is not sure that she will be able to make it. Patient stated that her daughter is in ICU critical care in [facility] and she is not going to leave her bedside. Patient stated that she would like for Dr. Einar Pheasant to send her something in stronger for the pain. Patient is requesting that it be sent to [facility] at [address], [place], [place].  Patient is requesting a call back.

## 2021-03-04 NOTE — Telephone Encounter (Signed)
Left message with this information on patient's voicemail.  

## 2021-03-04 NOTE — Telephone Encounter (Signed)
Meloxicam sent to pharmacy.   If she feels she needs something stronger would recommend urgent care or ER evaluation.   Will also route to referral coordinators as it appears she may need to reschedule her CT scan

## 2021-03-08 ENCOUNTER — Telehealth: Payer: Self-pay

## 2021-03-08 MED ORDER — HYDROXYZINE HCL 50 MG PO TABS: 25.0000 mg | ORAL_TABLET | Freq: Three times a day (TID) | ORAL | 0 refills | Status: DC | PRN

## 2021-03-08 NOTE — Telephone Encounter (Signed)
Patient says she is willing to try the hydroxyzine. Please send as soon as possible to: [facility] pharmacy

## 2021-03-08 NOTE — Telephone Encounter (Signed)
I am unable to send in controlled substances to another state.   Can try hydroxyzine 25 mg as alternative. If she would like.   Otherwise would recommend urgent care or telehealth with a [facility].

## 2021-03-08 NOTE — Telephone Encounter (Signed)
[name] - Client TELEPHONE ADVICE RECORD Access[facility]urse Patient Name: Carla Martinez Gender: Female DOB: [birth year] Age: 60 Y 36 M 10 D Return Phone Number: [phone] (Primary) Address: City/ State/ Zip: [place] [address] Client [facility] Night - Client Client Site [place] Physician Waunita Schooner- MD Contact Type Call Who Is Calling Patient / Member / Family / Caregiver Call Type Triage / Clinical Relationship To Patient Self Return Phone Number [phone] (Primary) Chief Complaint Anxiety and Panic Attack Reason for Call Symptomatic / Request for [name] states she needs a prescription for a sedative. Her daughter has a few days to live. THe pharmacy is [facility] [place] in [place] Translation No Nurse Assessment Nurse: Quincy Simmonds, RN, Annita Brod Date/Time Carla Martinez Time): 03/07/2021 7:26:22 PM Confirm and document reason for call. If symptomatic, describe symptoms. ---Caller states she needs a prescription for a sedative. Her daughter has a few days to live. The pharmacy is [facility] [place] in [place] (is in [place] in hospital oldest daughter has 24-48hrs to live, in icu) Does the patient have any new or worsening symptoms? ---Yes Will a triage be completed? ---Yes Related visit to physician within the last 2 weeks? ---No Does the PT have any chronic conditions? (i.e. diabetes, asthma, this includes High risk factors for pregnancy, etc.) ---No Is this a behavioral health or substance abuse call? ---No Guidelines Guideline Title Affirmed Question Affirmed Notes Nurse Date/Time (Eastern Time) Depression [1] Depression AND [2] unable to do any of normal activities (e.g., self care, school, work; in comparison to baseline). Quincy Simmonds, RN, Annita Brod 03/07/2021 7:28:45 PM PLEASE NOTE: All  timestamps contained within this report are represented as [place] Standard Time. CONFIDENTIALTY NOTICE: This fax transmission is intended only for the addressee. It contains information that is legally privileged, confidential or otherwise protected from use or disclosure. If you are not the intended recipient, you are strictly prohibited from reviewing, disclosing, copying using or disseminating any of this information or taking any action in reliance on or regarding this information. If you have received this fax in error, please notify us immediately by telephone so that we can arrange for its return to [place]. Phone: [phone], Toll-Free: [phone], Fax: [phone] Page: 2 of 2 Call Id: [id number] [place]. Time Carla Martinez Time) Disposition Final User 03/07/2021 7:36:34 PM Called On-Call Provider Quincy Simmonds, RN, Annita Brod 03/07/2021 7:29:39 PM Go to ED Now Yes Quincy Simmonds, RN, Eric Form Disagree/Comply Comply Caller Understands Yes PreDisposition Did not know what to do Care Advice Given Per Guideline GO TO ED NOW: CARE ADVICE given per Depression (Adult) guideline. Comments User: [name], RN Date/Time (Eastern Time): 03/07/2021 7:37:49 PM Called pt back and advised her to go to the ER or to call tom and be seen in the clinic Referrals [place] Phone DateTime Result/ Outcome Message Type Notes Thersa Salt- MD [phone] 03/07/2021 7:36:34 PM Called On Call Provider - Reached Doctor Paged Thersa Salt- MD 03/07/2021 7:36:56 PM Spoke with On Call - General Message Result on call advised for pt to be seen tom by calling in the morning.

## 2021-03-08 NOTE — Telephone Encounter (Signed)
Sent in hydroxyzine

## 2021-03-13 ENCOUNTER — Ambulatory Visit: Admission: RE | Admit: 2021-03-13 | Payer: No Typology Code available for payment source | Source: Ambulatory Visit

## 2021-03-14 ENCOUNTER — Other Ambulatory Visit: Payer: Self-pay

## 2021-03-14 DIAGNOSIS — F3340 Major depressive disorder, recurrent, in remission, unspecified: Secondary | ICD-10-CM

## 2021-03-14 NOTE — Telephone Encounter (Signed)
Last refill: 08/17/20 #180 with 1  Last OV: 02/27/21  No future OV

## 2021-03-15 MED ORDER — TRAZODONE HCL 50 MG PO TABS: 50.0000 mg | ORAL_TABLET | Freq: Two times a day (BID) | ORAL | 1 refills | Status: DC

## 2021-03-21 ENCOUNTER — Other Ambulatory Visit: Payer: Self-pay

## 2021-03-21 DIAGNOSIS — F3340 Major depressive disorder, recurrent, in remission, unspecified: Secondary | ICD-10-CM

## 2021-03-21 NOTE — Telephone Encounter (Signed)
Pharmacy requests refill on: Quetiapine 100 mg   LAST REFILL: 08/17/2020 (Q-270, R-1) LAST OV: 02/27/2021 NEXT OV: Not Scheduled  PHARMACY: [id number] [place], [place]

## 2021-03-22 MED ORDER — QUETIAPINE FUMARATE 100 MG PO TABS: 100.0000 mg | ORAL_TABLET | Freq: Three times a day (TID) | ORAL | 1 refills | Status: DC

## 2021-04-08 ENCOUNTER — Telehealth: Payer: Self-pay

## 2021-04-08 NOTE — Telephone Encounter (Signed)
PLEASE NOTE: All timestamps contained within this report are represented as [place] Standard Time. CONFIDENTIALTY NOTICE: This fax transmission is intended only for the addressee. It contains information that is legally privileged, confidential or otherwise protected from use or disclosure. If you are not the intended recipient, you are strictly prohibited from reviewing, disclosing, copying using or disseminating any of this information or taking any action in reliance on or regarding this information. If you have received this fax in error, please notify us immediately by telephone so that we can arrange for its return to [place]. Phone: [phone], Toll-Free: [phone], Fax: [phone] Page: 1 of 2 Call Id: [id number] [place] Day - Client TELEPHONE ADVICE RECORD [facility] Patient Name: Carla Martinez Gender: Unknown DOB: [birth year] Age: 60 Y 83 M 11 D Return Phone Number: [phone] (Primary) Address: City/ State/ Zip: [place] [address] Client [facility] Day - Client Client Site [place] - [facility] Physician Waunita Schooner- MD Contact Type Call Who Is Calling Patient / Member / Family / Caregiver Call Type Triage / Clinical Relationship To Patient Self Return Phone Number [phone] (Primary) Chief Complaint BREATHING - shortness of breath or sounds breathless Reason for Call Symptomatic / Request for [place] from office is transferring pt who needs triage for extreme SOB, fatigue, bloating, diarrhea. No appts today. Translation No Nurse Assessment Nurse: Eugenio Hoes, RN, Jenny Reichmann Date/Time (Eastern Time): 04/08/2021 3:06:51 PM Confirm and document reason for call. If symptomatic, describe symptoms. ---Caller states bloating and dark watery stool, have not been able to eat, sob, cough, fatigue. Had exposure last week to [place]. Does the patient have any new or  worsening symptoms? ---Yes Will a triage be completed? ---Yes Related visit to physician within the last 2 weeks? ---No Does the PT have any chronic conditions? (i.e. diabetes, asthma, this includes High risk factors for pregnancy, etc.) ---No Is this a behavioral health or substance abuse call? ---No Guidelines Guideline Title Affirmed Question Affirmed Notes Nurse Date/Time ([place] Time) COVID-19 - Diagnosed or Suspected Difficult to awaken or acting confused (e.g., disoriented, slurred speech) Eugenio Hoes, [place], Jenny Reichmann 04/08/2021 3:10:00 PM Disp. Time Eilene Ghazi Time) Disposition Final User 04/08/2021 3:04:45 PM Send to Urgent Vassie Loll 04/08/2021 3:18:16 PM 911 Outcome Documentation Eugenio Hoes, RN, Jenny Reichmann PLEASE NOTE: All timestamps contained within this report are represented as [place] Standard Time. CONFIDENTIALTY NOTICE: This fax transmission is intended only for the addressee. It contains information that is legally privileged, confidential or otherwise protected from use or disclosure. If you are not the intended recipient, you are strictly prohibited from reviewing, disclosing, copying using or disseminating any of this information or taking any action in reliance on or regarding this information. If you have received this fax in error, please notify us immediately by telephone so that we can arrange for its return to [place]. Phone: [phone], Toll-Free: [phone], Fax: [phone] Page: 2 of 2 Call Id: [id number] [place]. Time (Eastern Time) Disposition Final User Reason: Caller declined calling 911 because she has a disabled son that she needs to take care of 04/08/2021 3:17:15 PM Call EMS 911 Now Yes Eugenio Hoes, RN, Jenny Reichmann Caller Disagree/Comply Disagree Caller Understands Yes PreDisposition InappropriateToAsk Care Advice Given Per Guideline CALL EMS 911 NOW: * Immediate medical attention is needed. You need to hang up and call 911 (or an ambulance). * Triager Discretion:  I'll call you back in a few minutes to be sure you were able to reach them. TELL  THE AMBULANCE DISPATCHER ABOUT COVID-19 DIAGNOSIS: * When you call 911, tell the dispatcher that you probably have COVID-19. CARE ADVICE given per COVID-19 - DIAGNOSED OR SUSPECTED (Adult) guideline. Comments User: Baird Cancer, RN Date/Time Eilene Ghazi Time): 04/08/2021 3:11:17 PM she is forgetfull and has not been able to get out of a parking lot User: Baird Cancer, RN Date/Time (Eastern Time): 04/08/2021 3:17:12 PM caller refusing to go call 911 or go to ER, states that she has a disabled son to take care of

## 2021-04-08 NOTE — Telephone Encounter (Signed)
Agree with recommendation for ER given symptoms. Noted that patient declined to go

## 2021-04-08 NOTE — Telephone Encounter (Signed)
Called patient and advised her that Access Nurse had called the office stating that they advised her that she needed to call 911. Patient stated that her symptoms are not that bad right now. Patient stated that she has a non-verbal disabled son at home that she has to care for. Patient stated that she will have to make arrangements for him and will go to the ER tomorrow if she is not any better. Patient was advised if she is having SOB and difficulty breathing she needs to go to the ER ASAP. Patient stated that she will try to work something out to go.

## 2021-04-08 NOTE — Telephone Encounter (Signed)
Patient called triage line. States that her daughter passed away on 13-Mar-2023. States that they now think they may have covid. Has been tested today but does not have results. States that she is still having the GI issues that she was seen by you in the past. With loose stools and bloating. She is now having extreme sob and not productive cough. She is extremely exhausted. Have sent call to access nurse line to triage.   I have sent to access nurse as well as Dr. Einar Pheasant

## 2021-04-08 NOTE — Telephone Encounter (Signed)
Patient was transferred to access nurse.  Access Nurse called the office back and told our front office supervisor that they recommended that patient call 911. Access nurse stated that patient refused stating that she has a disabled child at home that she is caring for.

## 2021-04-10 ENCOUNTER — Ambulatory Visit: Payer: No Typology Code available for payment source

## 2021-04-12 ENCOUNTER — Encounter: Payer: Self-pay | Admitting: Internal Medicine

## 2021-04-12 ENCOUNTER — Ambulatory Visit (INDEPENDENT_AMBULATORY_CARE_PROVIDER_SITE_OTHER): Payer: No Typology Code available for payment source | Admitting: Internal Medicine

## 2021-04-12 ENCOUNTER — Other Ambulatory Visit: Payer: Self-pay

## 2021-04-12 DIAGNOSIS — J181 Lobar pneumonia, unspecified organism: Secondary | ICD-10-CM

## 2021-04-12 MED ORDER — CEFTRIAXONE SODIUM 1 G IJ SOLR
1.0000 g | Freq: Once | INTRAMUSCULAR | Status: AC
  Administered 2021-04-12: 1 g via INTRAMUSCULAR

## 2021-04-12 MED ORDER — AZITHROMYCIN 250 MG PO TABS: ORAL_TABLET | ORAL | 0 refills | Status: DC

## 2021-04-12 NOTE — Addendum Note (Signed)
Addended by: Pilar Grammes on: 04/12/2021 12:30 PM   Modules accepted: Orders

## 2021-04-12 NOTE — Assessment & Plan Note (Signed)
Symptoms and PE findings consistent with RLL pneumonia I am not allowed to do CXR here since she may have had COVID (though 2 weeks from start of illness) Still a smoker Oxygen sats 87-94%----but mostly over 90% Not distressed  Discussed alternatives ---mostly ER evaluation now Will proceed with rocephin 1gm IM now Z-pak  Had negative COVID PCR though with exposure it is still likely she had it (but too far out now for Rx)  If worsens, call 911 for emergency transport to ER

## 2021-04-12 NOTE — Progress Notes (Signed)
Subjective:    Patient ID: Carla Martinez, female    DOB: Sep 26, 1961, 60 y.o.   MRN: [id number]  HPI Here due to respiratory infection This visit occurred during the SARS-CoV-2 public health emergency.  Safety protocols were in place, including screening questions prior to the visit, additional usage of staff PPE, and extensive cleaning of exam room while observing appropriate contact time as indicated for disinfecting solutions.   Has been sick for 2 weeks now On vacation last [place] and [place] then[place] stayed in[place] Started with bad congestion, extreme fatigue, lightheadedness, some SOB Only SOB with activity Cough ---loose mucus (winds up swallowing it) No fever or chills--but some sweats  Doesn't feel worse this week--but is ongoing  Hasn't been eating Using supplemental drinks Grieving 89 year old daughter  Lots of bloating and dark loose stools (watery) Ongoing evaluation---CT postponed No abdominal pain  Mother tested positive for COVID and 3 others who were on vacation with her last week (on Monday) She had test at [facility] on Monday  Current Outpatient Medications on File Prior to Visit  Medication Sig Dispense Refill   Biotin 1000 MCG tablet Take 1,000 mcg by mouth daily.     hydrOXYzine (ATARAX/VISTARIL) 50 MG tablet Take 0.5-2 tablets (25-100 mg total) by mouth 3 (three) times daily as needed for anxiety. 30 tablet 0   meloxicam (MOBIC) 7.5 MG tablet Take 1 tablet (7.5 mg total) by mouth daily. 30 tablet 0   Multiple Vitamins-Minerals (WOMENS 50+ ADVANCED PO) Take 1 tablet by mouth daily.     Potassium 99 MG TABS Take 2 tablets by mouth daily.     QUEtiapine (SEROQUEL) 100 MG tablet Take 1 tablet (100 mg total) by mouth 3 (three) times daily. 270 tablet 1   trazodone (DESYREL) 300 MG tablet Take 1 tablet (300 mg total) by mouth at bedtime. 90 tablet 1   traZODone (DESYREL) 50 MG tablet Take 1 tablet (50 mg total) by mouth 2 (two) times daily. 180 tablet 1    dicyclomine (BENTYL) 10 MG capsule Take 1 capsule (10 mg total) by mouth 4 (four) times daily -  before meals and at bedtime. (Patient not taking: Reported on 04/12/2021) 90 capsule 0   No current facility-administered medications on file prior to visit.    No Known Allergies  Past Medical History:  Diagnosis Date   Anxiety    Depression     Past Surgical History:  Procedure Laterality Date   CHOLECYSTECTOMY     COLON RESECTION     12" removed   MENISCUS REPAIR Bilateral     Family History  Problem Relation Age of Onset   Leukemia Sister    Diabetes type I Child    Heart disease Sister    Hypertension Mother    Heart disease Mother    Stroke Mother    Down syndrome Son    Stroke Son     Social History   Socioeconomic History   Marital status: Single    Spouse name: Not on file   Number of children: 6   Years of education: some college   Highest education level: Not on file  Occupational History   Not on file  Tobacco Use   Smoking status: Every Day    Packs/day: 0.25    Years: 17.00    Pack years: 4.25    Types: Cigarettes    Start date: 11/25/2003   Smokeless tobacco: Never   Tobacco comments:  used to smoke 1PPD but has decreased. Down to 1-2 cigarettes daily  Vaping Use   Vaping Use: Never used  Substance and Sexual Activity   Alcohol use: Yes    Comment: 3-4 glasses a night   Drug use: Never   Sexual activity: Not Currently  Other Topics Concern   Not on file  Social History Narrative   02/14/21   From: moved to [facility] 2020 to be near family   Living: with mom and son who is dependent adult son with Down syndrome   Work: care giver      Family: 6 children - [name] lives with her, Surveyor, minerals (2002), - 4 living grandchildren      Enjoys: Training and development officer, Medical laboratory scientific officer      Exercise: use to exercise non-stop   Diet: good, today ate 2 hard boiled eggs, 1 meal and few snacks      Safety   Seat belts: Yes    Guns: No   Safe in relationships: Yes    Social  Determinants of Radio broadcast assistant Strain: Not on file  Food Insecurity: Not on file  Transportation Needs: Not on file  Physical Activity: Not on file  Stress: Not on file  Social Connections: Not on file  Intimate Partner Violence: Not on file   Review of Systems Has lost some weight Smoking only 2 cigarettes a day now. Previously more (1 PPD years ago)     Objective:   Physical Exam Constitutional:      Comments: Some wasting  Cardiovascular:     Rate and Rhythm: Regular rhythm.     Heart sounds: No murmur heard.   No gallop.     Comments: Rate ~100 Squeaky sound in back (with heartbeat) Pulmonary:     Breath sounds: No wheezing or rales.     Comments: Markedly decreased breath sounds in RLL Clear otherwise Neurological:     Mental Status: She is alert.           Assessment & Plan:

## 2021-04-12 NOTE — Patient Instructions (Signed)
Call 911 for emergency evaluation if your breathing gets worse.

## 2021-04-15 ENCOUNTER — Ambulatory Visit: Payer: No Typology Code available for payment source | Admitting: Family Medicine

## 2021-04-25 ENCOUNTER — Other Ambulatory Visit: Payer: Self-pay

## 2021-04-25 DIAGNOSIS — F3340 Major depressive disorder, recurrent, in remission, unspecified: Secondary | ICD-10-CM

## 2021-04-25 MED ORDER — TRAZODONE HCL 300 MG PO TABS: 300.0000 mg | ORAL_TABLET | Freq: Every day | ORAL | 3 refills | Status: DC

## 2021-04-25 NOTE — Telephone Encounter (Signed)
Patient called stating that [facility] was suppose to have sent RX refill request on 04/20/21 for Trazodone 300 mg at bedtime but they have not heard anything. I do not see anything in the chart unless it came through as a paper fax. Patient is going out of town in the morning and needs this refilled today.  Please let patient know when this is done. Thank you.

## 2021-04-25 NOTE — Telephone Encounter (Signed)
Patient advised.

## 2021-05-09 ENCOUNTER — Other Ambulatory Visit: Payer: Self-pay

## 2021-05-09 ENCOUNTER — Ambulatory Visit (INDEPENDENT_AMBULATORY_CARE_PROVIDER_SITE_OTHER): Payer: No Typology Code available for payment source | Admitting: Family Medicine

## 2021-05-09 VITALS — BP 100/62 | HR 104 | Temp 98.0°F | Wt 91.0 lb

## 2021-05-09 DIAGNOSIS — R1032 Left lower quadrant pain: Secondary | ICD-10-CM | POA: Diagnosis not present

## 2021-05-09 DIAGNOSIS — K623 Rectal prolapse: Secondary | ICD-10-CM | POA: Insufficient documentation

## 2021-05-09 DIAGNOSIS — F330 Major depressive disorder, recurrent, mild: Secondary | ICD-10-CM | POA: Diagnosis not present

## 2021-05-09 DIAGNOSIS — R197 Diarrhea, unspecified: Secondary | ICD-10-CM

## 2021-05-09 DIAGNOSIS — F5000 Anorexia nervosa, unspecified: Secondary | ICD-10-CM

## 2021-05-09 MED ORDER — HYDROXYZINE HCL 50 MG PO TABS: 100.0000 mg | ORAL_TABLET | Freq: Three times a day (TID) | ORAL | 1 refills | Status: DC | PRN

## 2021-05-09 NOTE — Assessment & Plan Note (Signed)
Persistent symptoms in setting of recent abdominal pain/bloating. Pt with hx of bowel resection so higher risk for obstruction but abdominal exam reassuring. Discussed ER precautions as well as call back. Will get stool studies and CT scan. GI referral placed with plan for routine colonoscopy as well as additional work-up if no cause identified.

## 2021-05-09 NOTE — Assessment & Plan Note (Signed)
Weight is stable with poor PO intake 2/2 to diarrhea. Encouraged to try and do a protein/meal replacement at least 3 times a per day. Therapy and psych referral

## 2021-05-09 NOTE — Patient Instructions (Addendum)
#   Call to reschedule the CT Scan   #Anxiety/depression  - would strongly recommend therapy - see numbers below - referral to psychiatry      [place] location:   [facility] [phone]-  -Counseling/therapy (14 years and up- Medicaid insurance only)   -Psychiatry services most insurances accepted-treat and evaluate/diagnose patients and prescribe medications.  -Medication Management 58 years old to 78 years.  -Diagnoses treated: Depression/anxiety, ADHD, Substance Abuse, Bipolar Disorder, Psychotherapy, Pain Management, Spiritual Care Services.  [facility] (99 years of age and older) [phone]- Psychology services/therapy only.  [facility] [phone]- All ages-Just Therapy services   [facility] [phone]- [place] [facility]-counseling only.   [facility] [phone]- All ages-Psychology and Psychiatrist services (evaluate, treat, diagnose, prescribe medication) Treat all the diagnoses that would fall under Behavioral issues.   For new patients, on Monday, Wednesday and Friday from 8 am to 3 pm patient would just walk in and fill out paperwork and they would get patient enrolled in the services they need based on their answers.   [facility] [phone]- All ages. Monday through Friday-9am to 4 pm walk in times for patients to come in and be evaluated. Medication Management only at this time. No therapy available.   [place] location:   [facility] Address: [address] [place] [address] Phone: [phone] Counseling and psychiatry services.   [facility] and [place] locations)- [phone]- all ages, only therapy/counseling services/psychological evaluations. Services: aptitude testing, academic achievement testing, learning Disability evaluation, ADHD evaluations, psycho-educational evaluations,  readiness for kindergarten Evaluations.   [place] location only has therapy services right now  [facility] [phone] services only. Works off [address] in [place].

## 2021-05-09 NOTE — Assessment & Plan Note (Signed)
Worsening in the setting of recent loss of her daughter. She notes being on trazodone and Seroquel for 10+ years. Hydroxyzine prn for severe anxiety. Given atypical regimen - and pt not completely sure of hx due to prior admission to SNF and a lot of medication management at that time that is unclear to me discussed psych support would be best for next steps and close monitoring.

## 2021-05-09 NOTE — Progress Notes (Signed)
Subjective:     Carla Martinez is a 60 y.o. female presenting for Bloated and Diarrhea (For months )     Diarrhea    #abdominal bloating/diarrhea - also having indigestion - waking up with watery emesis - has not been able to eat normally - had a protein shake, V8 - difficulty keeping down solid food - has been doing shakes, V8 - 2-3 BM daily - no blood in the stool - watery stools - no formed stools for months - bloating is improved some - no laxative uses for a long time   Has cut back to 1 cig per day  Has several family members   Review of Systems  Gastrointestinal:  Positive for diarrhea.    Social History   Tobacco Use  Smoking Status Every Day   Packs/day: 0.25   Years: 17.00   Pack years: 4.25   Types: Cigarettes   Start date: 11/25/2003  Smokeless Tobacco Never  Tobacco Comments   used to smoke 1PPD but has decreased. Down to 1-2 cigarettes daily        Objective:    BP Readings from Last 3 Encounters:  05/09/21 100/62  04/12/21 98/62  02/27/21 98/68   Wt Readings from Last 3 Encounters:  05/09/21 91 lb (41.3 kg)  04/12/21 91 lb (41.3 kg)  02/27/21 95 lb (43.1 kg)    BP 100/62   Pulse (!) 104   Temp 98 F (36.7 C) (Temporal)   Wt 91 lb (41.3 kg)   SpO2 97%   BMI 16.12 kg/m    Physical Exam Constitutional:      General: She is not in acute distress.    Appearance: She is well-developed. She is not diaphoretic.  HENT:     Right Ear: External ear normal.     Left Ear: External ear normal.     Nose: Nose normal.  Eyes:     Conjunctiva/sclera: Conjunctivae normal.  Cardiovascular:     Rate and Rhythm: Normal rate and regular rhythm.     Heart sounds: No murmur heard. Pulmonary:     Effort: Pulmonary effort is normal. No respiratory distress.     Breath sounds: Normal breath sounds. No wheezing.  Abdominal:     General: Abdomen is flat. Bowel sounds are increased.     Palpations: Abdomen is soft.     Tenderness: There  is no abdominal tenderness. There is no guarding or rebound.  Musculoskeletal:     Cervical back: Neck supple.  Skin:    General: Skin is warm and dry.     Capillary Refill: Capillary refill takes less than 2 seconds.  Neurological:     Mental Status: She is alert. Mental status is at baseline.  Psychiatric:        Mood and Affect: Mood normal.        Behavior: Behavior normal.    GAD 7 : Generalized Anxiety Score 08/17/2020  Nervous, Anxious, on Edge 3  Control/stop worrying 1  Worry too much - different things 2  Trouble relaxing 3  Restless 2  Easily annoyed or irritable 2  Afraid - awful might happen 2  Total GAD 7 Score 15    Depression screen [facility] 2/9 02/14/2021 08/17/2020 08/17/2020  Decreased Interest 1 0 0  Down, Depressed, Hopeless 1 1 1   PHQ - 2 Score 2 1 1   Altered sleeping 3 3 -  Tired, decreased energy 2 1 -  Change in appetite 1 0 -  Feeling bad or failure about yourself  1 3 -  Trouble concentrating 1 3 -  Moving slowly or fidgety/restless 1 3 -  Suicidal thoughts 1 1 -  PHQ-9 Score 12 15 -  Difficult doing work/chores Somewhat difficult Somewhat difficult -         Assessment & Plan:   Problem List Items Addressed This Visit       Digestive   Rectal prolapse     Other   Mild recurrent major depression (HCC)    Worsening in the setting of recent loss of her daughter. She notes being on trazodone and Seroquel for 10+ years. Hydroxyzine prn for severe anxiety. Given atypical regimen - and pt not completely sure of hx due to prior admission to SNF and a lot of medication management at that time that is unclear to me discussed psych support would be best for next steps and close monitoring.        Relevant Medications   hydrOXYzine (ATARAX/VISTARIL) 50 MG tablet   Other Relevant Orders   Ambulatory referral to Psychiatry   Anorexia nervosa in remission    Weight is stable with poor PO intake 2/2 to diarrhea. Encouraged to try and do a  protein/meal replacement at least 3 times a per day. Therapy and psych referral       Relevant Medications   hydrOXYzine (ATARAX/VISTARIL) 50 MG tablet   Other Relevant Orders   Ambulatory referral to Psychiatry   Left lower quadrant abdominal pain   Relevant Orders   Ambulatory referral to Gastroenterology   Watery diarrhea - Primary    Persistent symptoms in setting of recent abdominal pain/bloating. Pt with hx of bowel resection so higher risk for obstruction but abdominal exam reassuring. Discussed ER precautions as well as call back. Will get stool studies and CT scan. GI referral placed with plan for routine colonoscopy as well as additional work-up if no cause identified.        Relevant Orders   Gastrointestinal Pathogen Panel PCR   Giardia/cryptosporidium (EIA)   Ova and parasite examination   Calprotectin, Fecal   Ambulatory referral to Gastroenterology     Return if symptoms worsen or fail to improve.  Carla Noe, MD  This visit occurred during the SARS-CoV-2 public health emergency.  Safety protocols were in place, including screening questions prior to the visit, additional usage of staff PPE, and extensive cleaning of exam room while observing appropriate contact time as indicated for disinfecting solutions.

## 2021-05-13 ENCOUNTER — Other Ambulatory Visit (INDEPENDENT_AMBULATORY_CARE_PROVIDER_SITE_OTHER): Payer: No Typology Code available for payment source

## 2021-05-13 ENCOUNTER — Encounter: Payer: Self-pay | Admitting: *Deleted

## 2021-05-13 DIAGNOSIS — R197 Diarrhea, unspecified: Secondary | ICD-10-CM

## 2021-05-15 ENCOUNTER — Other Ambulatory Visit: Payer: Self-pay | Admitting: Family Medicine

## 2021-05-15 ENCOUNTER — Other Ambulatory Visit (INDEPENDENT_AMBULATORY_CARE_PROVIDER_SITE_OTHER): Payer: No Typology Code available for payment source

## 2021-05-15 DIAGNOSIS — Z1211 Encounter for screening for malignant neoplasm of colon: Secondary | ICD-10-CM

## 2021-05-15 LAB — FECAL OCCULT BLOOD, IMMUNOCHEMICAL: Fecal Occult Bld: NEGATIVE

## 2021-05-17 LAB — GASTROINTESTINAL PATHOGEN PANEL PCR
C. difficile Tox A/B, PCR: NOT DETECTED
Campylobacter, PCR: NOT DETECTED
Cryptosporidium, PCR: NOT DETECTED
E coli (ETEC) LT/ST PCR: NOT DETECTED
E coli (STEC) stx1/stx2, PCR: NOT DETECTED
E coli 0157, PCR: NOT DETECTED
Giardia lamblia, PCR: NOT DETECTED
Norovirus, PCR: NOT DETECTED
Rotavirus A, PCR: NOT DETECTED
Salmonella, PCR: NOT DETECTED
Shigella, PCR: NOT DETECTED

## 2021-05-17 LAB — CALPROTECTIN: Calprotectin: 162 mcg/g — ABNORMAL HIGH

## 2021-05-20 LAB — OVA AND PARASITE EXAMINATION
CONCENTRATE RESULT:: NONE SEEN
MICRO NUMBER:: 12158563
SPECIMEN QUALITY:: ADEQUATE
TRICHROME RESULT:: NONE SEEN

## 2021-05-20 LAB — GIARDIA AND CRYPTOSPORIDIUM ANTIGEN PANEL
MICRO NUMBER:: 12158562
RESULT:: NOT DETECTED
SPECIMEN QUALITY:: ADEQUATE
Specimen Quality:: ADEQUATE
micro Number:: 12158561

## 2021-05-28 ENCOUNTER — Telehealth: Payer: Self-pay | Admitting: Family Medicine

## 2021-05-28 DIAGNOSIS — F330 Major depressive disorder, recurrent, mild: Secondary | ICD-10-CM

## 2021-05-28 DIAGNOSIS — F5000 Anorexia nervosa, unspecified: Secondary | ICD-10-CM

## 2021-05-28 NOTE — Telephone Encounter (Signed)
Referral placed.

## 2021-05-28 NOTE — Telephone Encounter (Signed)
Mrs. Manwaring called in and wanted to know about getting a referral to a Psychiatry in [place]

## 2021-05-29 NOTE — Telephone Encounter (Signed)
Carla Martinez called in and wanted to know if the referral could be done quicker due to the medication she has is all messed up and really needs to see someone soon.

## 2021-05-30 NOTE — Telephone Encounter (Signed)
Referral was placed twice --   Referral already sent to [facility] 05/09/21  Pt needs to call them to schedule, they have the referral and usually review it and then will call the patient to schedule. She can call them to inquire. Unfortunately we cannot schedule these appts for patients.  [facility] [address] [place], [place]

## 2021-05-30 NOTE — Telephone Encounter (Signed)
Spoke to pt and she asked that information be sent to her mychart so she would have it. Copied message from [place] and sent to pt in a mychart message.

## 2021-06-04 ENCOUNTER — Other Ambulatory Visit: Payer: Self-pay

## 2021-06-04 ENCOUNTER — Encounter: Payer: Self-pay | Admitting: Gastroenterology

## 2021-06-04 ENCOUNTER — Ambulatory Visit (INDEPENDENT_AMBULATORY_CARE_PROVIDER_SITE_OTHER): Payer: No Typology Code available for payment source | Admitting: Gastroenterology

## 2021-06-04 VITALS — BP 91/64 | HR 82 | Temp 97.5°F | Ht 63.0 in | Wt 89.1 lb

## 2021-06-04 DIAGNOSIS — K529 Noninfective gastroenteritis and colitis, unspecified: Secondary | ICD-10-CM

## 2021-06-04 DIAGNOSIS — R195 Other fecal abnormalities: Secondary | ICD-10-CM

## 2021-06-04 DIAGNOSIS — R634 Abnormal weight loss: Secondary | ICD-10-CM

## 2021-06-04 MED ORDER — AMITRIPTYLINE HCL 25 MG PO TABS: 25.0000 mg | ORAL_TABLET | Freq: Every day | ORAL | 2 refills | Status: DC

## 2021-06-04 MED ORDER — NA SULFATE-K SULFATE-MG SULF 17.5-3.13-1.6 GM/177ML PO SOLN: 354.0000 mL | Freq: Once | ORAL | 0 refills | Status: AC

## 2021-06-04 NOTE — Progress Notes (Signed)
Cephas Darby, MD [address]  [place]  [place], [place] [address]  Main: [phone]  Fax: [phone]    Gastroenterology Consultation  Referring Provider:     [name], MD Primary Care Physician:  [name], MD Primary Gastroenterologist:  Dr. Cephas Darby Reason for Consultation:     Chronic diarrhea        HPI:   Carla Martinez is a 60 y.o. female referred by Dr. Einar Pheasant, Jobe Marker, MD  for consultation & management of chronic diarrhea.  Patient has history of chronic tobacco use, prior history of heavy alcohol use who is referred for evaluation of approximately 6 to 8 weeks history of Nonbloody diarrhea associated with lower abdominal pain, abdominal bloating and low back pain.  Patient underwent stool studies which were negative for infection, fecal calprotectin levels were elevated to 162.  Patient reports that her diarrhea has somewhat slowed down but has significant bloating and continued weight loss.  Her oldest daughter passed away approximately 6 months ago and she has been suffering from prolonged grief reaction.  She has to take care of 40 year old mom as well as her son who is totally dependent on her.  Patient is overwhelmed, has anxiety as well as insomnia.  She is on trazodone.  She was started on Atarax which does not help much.  She is unable to get psychiatry appointment.  She does not have anemia.  TSH normal, LFTs are normal  Patient reports that she had 12 inches of her colon removed at [facility] due to intestinal prolapse.  She said she had 6 children that has caused the prolapse.  NSAIDs: None  Antiplts/Anticoagulants/Anti thrombotics: None  GI Procedures: Few years ago  Past Medical History:  Diagnosis Date  . Anxiety   . Depression     Past Surgical History:  Procedure Laterality Date  . CHOLECYSTECTOMY    . COLON RESECTION     12" removed  . MENISCUS REPAIR Bilateral     Current Outpatient  Medications:  .  amitriptyline (ELAVIL) 25 MG tablet, Take 1 tablet (25 mg total) by mouth at bedtime., Disp: 30 tablet, Rfl: 2 .  Biotin 1000 MCG tablet, Take 1,000 mcg by mouth daily., Disp: , Rfl:  .  hydrOXYzine (ATARAX/VISTARIL) 50 MG tablet, Take 2 tablets (100 mg total) by mouth 3 (three) times daily as needed for anxiety., Disp: 180 tablet, Rfl: 1 .  Multiple Vitamins-Minerals (WOMENS 50+ ADVANCED PO), Take 1 tablet by mouth daily., Disp: , Rfl:  .  Na Sulfate-K Sulfate-Mg Sulf 17.5-3.13-1.6 GM/177ML SOLN, Take 354 mLs by mouth once for 1 dose., Disp: 354 mL, Rfl: 0 .  Potassium 99 MG TABS, Take 2 tablets by mouth daily., Disp: , Rfl:  .  QUEtiapine (SEROQUEL) 100 MG tablet, Take 1 tablet (100 mg total) by mouth 3 (three) times daily., Disp: 270 tablet, Rfl: 1 .  trazodone (DESYREL) 300 MG tablet, Take 1 tablet (300 mg total) by mouth at bedtime., Disp: 90 tablet, Rfl: 3 .  traZODone (DESYREL) 50 MG tablet, Take 1 tablet (50 mg total) by mouth 2 (two) times daily. (Patient taking differently: Take 50 mg by mouth 3 (three) times daily.), Disp: 180 tablet, Rfl: 1    Family History  Problem Relation Age of Onset  . Leukemia Sister   . Diabetes type I Child   . Heart disease Sister   . Hypertension Mother   . Heart disease Mother   .  Stroke Mother   . Down syndrome Son   . Stroke Son      Social History   Tobacco Use  . Smoking status: Every Day    Packs/day: 0.25    Years: 17.00    Pack years: 4.25    Types: Cigarettes    Start date: 11/25/2003  . Smokeless tobacco: Never  . Tobacco comments:    used to smoke 1PPD but has decreased. Down to 1-2 cigarettes daily  Vaping Use  . Vaping Use: Never used  Substance Use Topics  . Alcohol use: Yes    Comment: 3-4 glasses a night  . Drug use: Never    Allergies as of 06/04/2021  . (No Known Allergies)    Review of Systems:    All systems reviewed and negative except where noted in HPI.   Physical Exam:  BP 91/64 (BP  Location: Left Arm, Patient Position: Sitting, Cuff Size: Normal)   Pulse 82   Temp (!) 97.5 F (36.4 C) (Oral)   Ht '5\' 3"'$  (1.6 m)   Wt 89 lb 2 oz (40.4 kg)   BMI 15.79 kg/m  No LMP recorded. Patient is postmenopausal.  General:   Alert, thin built, malnourished, pleasant and cooperative in NAD Head:  Normocephalic and atraumatic, bitemporal wasting. Eyes:  Sclera clear, no icterus.   Conjunctiva pink. Ears:  Normal auditory acuity. Nose:  No deformity, discharge, or lesions. Mouth:  No deformity or lesions,oropharynx pink & moist. Neck:  Supple; no masses or thyromegaly. Lungs:  Respirations even and unlabored.  Clear throughout to auscultation.   No wheezes, crackles, or rhonchi. No acute distress. Heart:  Regular rate and rhythm; no murmurs, clicks, rubs, or gallops. Abdomen:  Normal bowel sounds. Soft, non-tender and non-distended without masses, hepatosplenomegaly or hernias noted.  No guarding or rebound tenderness.   Rectal: Not performed Msk:  Symmetrical without gross deformities. Good, equal movement & strength bilaterally. Pulses:  Normal pulses noted. Extremities:  No clubbing or edema.  No cyanosis. Neurologic:  Alert and oriented x3;  grossly normal neurologically. Skin:  Intact without significant lesions or rashes. No jaundice. Psych:  Alert and cooperative. Normal mood and affect.  Imaging Studies: None  Assessment and Plan:   Carla Martinez is a 60 y.o. Caucasian female with history of colon resection, chronic tobacco use, history of alcohol use is seen in consultation for chronic diarrhea, lower abdominal pain, low back pain and elevated fecal calprotectin levels.  Her stool studies are negative for infectious etiology Recommend upper endoscopy and colonoscopy with biopsies Recommend pancreatic fecal elastase levels given her history of chronic tobacco use Empiric trial of pancreatic enzymes Start amitriptyline 25 mg at bedtime for  depression/anxiety/insomnia Patient will probably need cross-sectional imaging of her abdomen after above work-up   Follow up in 2 months   Cephas Darby, MD

## 2021-06-04 NOTE — Patient Instructions (Signed)
Please do not start the Zenpep till after you do Stool kit.  Please take Zenpep 1 capsule with the first bite of each meal and 1 capsule with first bite of each snack

## 2021-06-10 ENCOUNTER — Encounter: Payer: Self-pay | Admitting: Gastroenterology

## 2021-06-10 LAB — PANCREATIC ELASTASE, FECAL: Pancreatic Elastase, Fecal: 354 ug Elast./g (ref >200)

## 2021-07-01 ENCOUNTER — Encounter: Payer: Self-pay | Admitting: Gastroenterology

## 2021-07-02 ENCOUNTER — Encounter
Admission: EM | Disposition: A | Discharge status: Home / Self Care | Payer: Self-pay | Source: Home / Self Care | Attending: Internal Medicine

## 2021-07-02 ENCOUNTER — Encounter: Payer: Self-pay | Admitting: Registered Nurse

## 2021-07-02 ENCOUNTER — Encounter: Payer: Self-pay | Admitting: Emergency Medicine

## 2021-07-02 ENCOUNTER — Other Ambulatory Visit: Payer: Self-pay

## 2021-07-02 ENCOUNTER — Inpatient Hospital Stay: Payer: No Typology Code available for payment source

## 2021-07-02 ENCOUNTER — Emergency Department: Payer: No Typology Code available for payment source

## 2021-07-02 ENCOUNTER — Inpatient Hospital Stay
Admission: EM | Admit: 2021-07-02 | Discharge: 2021-07-09 | DRG: 280 | Disposition: A | Discharge status: Home / Self Care | Payer: No Typology Code available for payment source | Attending: Internal Medicine | Admitting: Internal Medicine

## 2021-07-02 ENCOUNTER — Ambulatory Visit
Admission: RE | Admit: 2021-07-02 | Payer: No Typology Code available for payment source | Source: Ambulatory Visit | Admitting: Gastroenterology

## 2021-07-02 DIAGNOSIS — D649 Anemia, unspecified: Secondary | ICD-10-CM | POA: Diagnosis present

## 2021-07-02 DIAGNOSIS — Z20822 Contact with and (suspected) exposure to covid-19: Secondary | ICD-10-CM | POA: Diagnosis present

## 2021-07-02 DIAGNOSIS — I480 Paroxysmal atrial fibrillation: Secondary | ICD-10-CM | POA: Diagnosis present

## 2021-07-02 DIAGNOSIS — K920 Hematemesis: Secondary | ICD-10-CM

## 2021-07-02 DIAGNOSIS — F10239 Alcohol dependence with withdrawal, unspecified: Secondary | ICD-10-CM | POA: Diagnosis not present

## 2021-07-02 DIAGNOSIS — E785 Hyperlipidemia, unspecified: Secondary | ICD-10-CM | POA: Diagnosis present

## 2021-07-02 DIAGNOSIS — K769 Liver disease, unspecified: Secondary | ICD-10-CM | POA: Diagnosis present

## 2021-07-02 DIAGNOSIS — R0602 Shortness of breath: Secondary | ICD-10-CM

## 2021-07-02 DIAGNOSIS — K219 Gastro-esophageal reflux disease without esophagitis: Secondary | ICD-10-CM | POA: Diagnosis present

## 2021-07-02 DIAGNOSIS — I2510 Atherosclerotic heart disease of native coronary artery without angina pectoris: Secondary | ICD-10-CM | POA: Diagnosis present

## 2021-07-02 DIAGNOSIS — Z7289 Other problems related to lifestyle: Secondary | ICD-10-CM | POA: Diagnosis not present

## 2021-07-02 DIAGNOSIS — F17210 Nicotine dependence, cigarettes, uncomplicated: Secondary | ICD-10-CM | POA: Diagnosis present

## 2021-07-02 DIAGNOSIS — I214 Non-ST elevation (NSTEMI) myocardial infarction: Secondary | ICD-10-CM | POA: Diagnosis present

## 2021-07-02 DIAGNOSIS — L89151 Pressure ulcer of sacral region, stage 1: Secondary | ICD-10-CM | POA: Diagnosis present

## 2021-07-02 DIAGNOSIS — M810 Age-related osteoporosis without current pathological fracture: Secondary | ICD-10-CM | POA: Diagnosis present

## 2021-07-02 DIAGNOSIS — L8990 Pressure ulcer of unspecified site, unspecified stage: Secondary | ICD-10-CM | POA: Insufficient documentation

## 2021-07-02 DIAGNOSIS — J690 Pneumonitis due to inhalation of food and vomit: Secondary | ICD-10-CM | POA: Diagnosis not present

## 2021-07-02 DIAGNOSIS — F4321 Adjustment disorder with depressed mood: Secondary | ICD-10-CM | POA: Diagnosis present

## 2021-07-02 DIAGNOSIS — K529 Noninfective gastroenteritis and colitis, unspecified: Secondary | ICD-10-CM | POA: Diagnosis not present

## 2021-07-02 DIAGNOSIS — Z66 Do not resuscitate: Secondary | ICD-10-CM | POA: Diagnosis present

## 2021-07-02 DIAGNOSIS — R197 Diarrhea, unspecified: Secondary | ICD-10-CM | POA: Diagnosis not present

## 2021-07-02 DIAGNOSIS — Z79899 Other long term (current) drug therapy: Secondary | ICD-10-CM

## 2021-07-02 DIAGNOSIS — I5022 Chronic systolic (congestive) heart failure: Secondary | ICD-10-CM | POA: Diagnosis present

## 2021-07-02 DIAGNOSIS — F322 Major depressive disorder, single episode, severe without psychotic features: Secondary | ICD-10-CM | POA: Diagnosis present

## 2021-07-02 DIAGNOSIS — E876 Hypokalemia: Secondary | ICD-10-CM | POA: Diagnosis present

## 2021-07-02 DIAGNOSIS — J9601 Acute respiratory failure with hypoxia: Secondary | ICD-10-CM | POA: Diagnosis not present

## 2021-07-02 DIAGNOSIS — K623 Rectal prolapse: Secondary | ICD-10-CM | POA: Diagnosis present

## 2021-07-02 DIAGNOSIS — E860 Dehydration: Secondary | ICD-10-CM

## 2021-07-02 DIAGNOSIS — G4700 Insomnia, unspecified: Secondary | ICD-10-CM | POA: Diagnosis present

## 2021-07-02 DIAGNOSIS — Z8249 Family history of ischemic heart disease and other diseases of the circulatory system: Secondary | ICD-10-CM

## 2021-07-02 DIAGNOSIS — R630 Anorexia: Secondary | ICD-10-CM | POA: Diagnosis present

## 2021-07-02 DIAGNOSIS — R112 Nausea with vomiting, unspecified: Secondary | ICD-10-CM

## 2021-07-02 DIAGNOSIS — F4323 Adjustment disorder with mixed anxiety and depressed mood: Secondary | ICD-10-CM | POA: Diagnosis present

## 2021-07-02 DIAGNOSIS — Z634 Disappearance and death of family member: Secondary | ICD-10-CM

## 2021-07-02 DIAGNOSIS — F19239 Other psychoactive substance dependence with withdrawal, unspecified: Secondary | ICD-10-CM | POA: Diagnosis not present

## 2021-07-02 DIAGNOSIS — F411 Generalized anxiety disorder: Secondary | ICD-10-CM | POA: Diagnosis present

## 2021-07-02 DIAGNOSIS — F1021 Alcohol dependence, in remission: Secondary | ICD-10-CM

## 2021-07-02 DIAGNOSIS — Z9049 Acquired absence of other specified parts of digestive tract: Secondary | ICD-10-CM

## 2021-07-02 DIAGNOSIS — G9341 Metabolic encephalopathy: Secondary | ICD-10-CM | POA: Diagnosis not present

## 2021-07-02 DIAGNOSIS — I110 Hypertensive heart disease with heart failure: Secondary | ICD-10-CM | POA: Diagnosis present

## 2021-07-02 DIAGNOSIS — I959 Hypotension, unspecified: Secondary | ICD-10-CM | POA: Diagnosis not present

## 2021-07-02 DIAGNOSIS — R4182 Altered mental status, unspecified: Secondary | ICD-10-CM | POA: Diagnosis not present

## 2021-07-02 DIAGNOSIS — R778 Other specified abnormalities of plasma proteins: Secondary | ICD-10-CM | POA: Diagnosis not present

## 2021-07-02 DIAGNOSIS — R140 Abdominal distension (gaseous): Secondary | ICD-10-CM | POA: Diagnosis not present

## 2021-07-02 DIAGNOSIS — Z789 Other specified health status: Secondary | ICD-10-CM

## 2021-07-02 DIAGNOSIS — F19982 Other psychoactive substance use, unspecified with psychoactive substance-induced sleep disorder: Secondary | ICD-10-CM

## 2021-07-02 DIAGNOSIS — I5181 Takotsubo syndrome: Principal | ICD-10-CM | POA: Diagnosis present

## 2021-07-02 DIAGNOSIS — R195 Other fecal abnormalities: Secondary | ICD-10-CM | POA: Diagnosis not present

## 2021-07-02 DIAGNOSIS — I4891 Unspecified atrial fibrillation: Secondary | ICD-10-CM | POA: Diagnosis present

## 2021-07-02 DIAGNOSIS — F1010 Alcohol abuse, uncomplicated: Secondary | ICD-10-CM

## 2021-07-02 DIAGNOSIS — D72829 Elevated white blood cell count, unspecified: Secondary | ICD-10-CM | POA: Diagnosis not present

## 2021-07-02 DIAGNOSIS — F330 Major depressive disorder, recurrent, mild: Secondary | ICD-10-CM | POA: Diagnosis present

## 2021-07-02 DIAGNOSIS — I9589 Other hypotension: Secondary | ICD-10-CM

## 2021-07-02 DIAGNOSIS — F3340 Major depressive disorder, recurrent, in remission, unspecified: Secondary | ICD-10-CM

## 2021-07-02 DIAGNOSIS — F419 Anxiety disorder, unspecified: Secondary | ICD-10-CM | POA: Diagnosis present

## 2021-07-02 LAB — COMPREHENSIVE METABOLIC PANEL
ALT: 44 U/L (ref 0–44)
AST: 67 U/L — ABNORMAL HIGH (ref 15–41)
Albumin: 4.3 g/dL (ref 3.5–5.0)
Alkaline Phosphatase: 67 U/L (ref 38–126)
Anion gap: 23 — ABNORMAL HIGH (ref 5–15)
BUN: 28 mg/dL — ABNORMAL HIGH (ref 6–20)
CO2: 30 mmol/L (ref 22–32)
Calcium: 10.6 mg/dL — ABNORMAL HIGH (ref 8.9–10.3)
Chloride: 84 mmol/L — ABNORMAL LOW (ref 98–111)
Creatinine, Ser: 1.15 mg/dL — ABNORMAL HIGH (ref 0.44–1.00)
GFR, Estimated: 56 mL/min — ABNORMAL LOW (ref >60)
Glucose, Bld: 215 mg/dL — ABNORMAL HIGH (ref 70–99)
Potassium: 3 mmol/L — ABNORMAL LOW (ref 3.5–5.1)
Sodium: 137 mmol/L (ref 135–145)
Total Bilirubin: 0.6 mg/dL (ref 0.3–1.2)
Total Protein: 7.2 g/dL (ref 6.5–8.1)

## 2021-07-02 LAB — CBC WITH DIFFERENTIAL/PLATELET
Abs Immature Granulocytes: 0.09 10*3/uL — ABNORMAL HIGH (ref 0.00–0.07)
Basophils Absolute: 0 10*3/uL (ref 0.0–0.1)
Basophils Relative: 0 %
Eosinophils Absolute: 0 10*3/uL (ref 0.0–0.5)
Eosinophils Relative: 0 %
HCT: 37.1 % (ref 36.0–46.0)
Hemoglobin: 13 g/dL (ref 12.0–15.0)
Immature Granulocytes: 1 %
Lymphocytes Relative: 8 %
Lymphs Abs: 1.3 10*3/uL (ref 0.7–4.0)
MCH: 33 pg (ref 26.0–34.0)
MCHC: 35 g/dL (ref 30.0–36.0)
MCV: 94.2 fL (ref 80.0–100.0)
Monocytes Absolute: 0.6 10*3/uL (ref 0.1–1.0)
Monocytes Relative: 4 %
Neutro Abs: 14.3 10*3/uL — ABNORMAL HIGH (ref 1.7–7.7)
Neutrophils Relative %: 87 %
Platelets: 428 10*3/uL — ABNORMAL HIGH (ref 150–400)
RBC: 3.94 MIL/uL (ref 3.87–5.11)
RDW: 13 % (ref 11.5–15.5)
WBC: 16.3 10*3/uL — ABNORMAL HIGH (ref 4.0–10.5)
nRBC: 0 % (ref 0.0–0.2)

## 2021-07-02 LAB — URINE DRUG SCREEN, QUALITATIVE (ARMC ONLY)
Amphetamines, Ur Screen: POSITIVE — AB
Barbiturates, Ur Screen: NOT DETECTED
Benzodiazepine, Ur Scrn: NOT DETECTED
Cannabinoid 50 Ng, Ur ~~LOC~~: POSITIVE — AB
Cocaine Metabolite,Ur ~~LOC~~: NOT DETECTED
MDMA (Ecstasy)Ur Screen: NOT DETECTED
Methadone Scn, Ur: NOT DETECTED
Opiate, Ur Screen: NOT DETECTED
Phencyclidine (PCP) Ur S: POSITIVE — AB
Tricyclic, Ur Screen: POSITIVE — AB

## 2021-07-02 LAB — TROPONIN I (HIGH SENSITIVITY)
Troponin I (High Sensitivity): 690 ng/L (ref <18)
Troponin I (High Sensitivity): 1135 ng/L (ref <18)
Troponin I (High Sensitivity): 1119 ng/L (ref <18)

## 2021-07-02 LAB — URINALYSIS, ROUTINE W REFLEX MICROSCOPIC
Bacteria, UA: NONE SEEN
Bilirubin Urine: NEGATIVE
Glucose, UA: 50 mg/dL — AB
Hgb urine dipstick: NEGATIVE
Ketones, ur: NEGATIVE mg/dL
Leukocytes,Ua: NEGATIVE
Nitrite: NEGATIVE
Protein, ur: 30 mg/dL — AB
Specific Gravity, Urine: >1.046 — ABNORMAL HIGH (ref 1.005–1.030)
pH: 5 (ref 5.0–8.0)

## 2021-07-02 LAB — SALICYLATE LEVEL: Salicylate Lvl: <7 mg/dL — ABNORMAL LOW (ref 7.0–30.0)

## 2021-07-02 LAB — ACETAMINOPHEN LEVEL: Acetaminophen (Tylenol), Serum: <10 ug/mL — ABNORMAL LOW (ref 10–30)

## 2021-07-02 LAB — HEPATITIS PANEL, ACUTE
HCV Ab: NONREACTIVE
Hep A IgM: NONREACTIVE
Hep B C IgM: NONREACTIVE
Hepatitis B Surface Ag: NONREACTIVE

## 2021-07-02 LAB — PHOSPHORUS: Phosphorus: 2.9 mg/dL (ref 2.5–4.6)

## 2021-07-02 LAB — TSH: TSH: 0.751 u[IU]/mL (ref 0.350–4.500)

## 2021-07-02 LAB — APTT: aPTT: >200 seconds (ref 24–36)

## 2021-07-02 LAB — ETHANOL: Alcohol, Ethyl (B): <10 mg/dL (ref <10)

## 2021-07-02 LAB — HEPARIN LEVEL (UNFRACTIONATED): Heparin Unfractionated: <0.1 IU/mL — ABNORMAL LOW (ref 0.30–0.70)

## 2021-07-02 LAB — MRSA NEXT GEN BY PCR, NASAL: MRSA by PCR Next Gen: NOT DETECTED

## 2021-07-02 LAB — T4, FREE: Free T4: 1.15 ng/dL — ABNORMAL HIGH (ref 0.61–1.12)

## 2021-07-02 LAB — MAGNESIUM: Magnesium: 1.8 mg/dL (ref 1.7–2.4)

## 2021-07-02 LAB — PROTIME-INR
INR: 1.1 (ref 0.8–1.2)
Prothrombin Time: 14.2 seconds (ref 11.4–15.2)

## 2021-07-02 LAB — LIPASE, BLOOD: Lipase: 30 U/L (ref 11–51)

## 2021-07-02 IMAGING — DX DG CHEST 1V PORT
1 series · 1 of 1 positions shown · non-contrast
Comparison: None.

CLINICAL DATA: Nausea, vomiting, diarrhea

EXAM:
PORTABLE CHEST 1 VIEW

[chest ap]
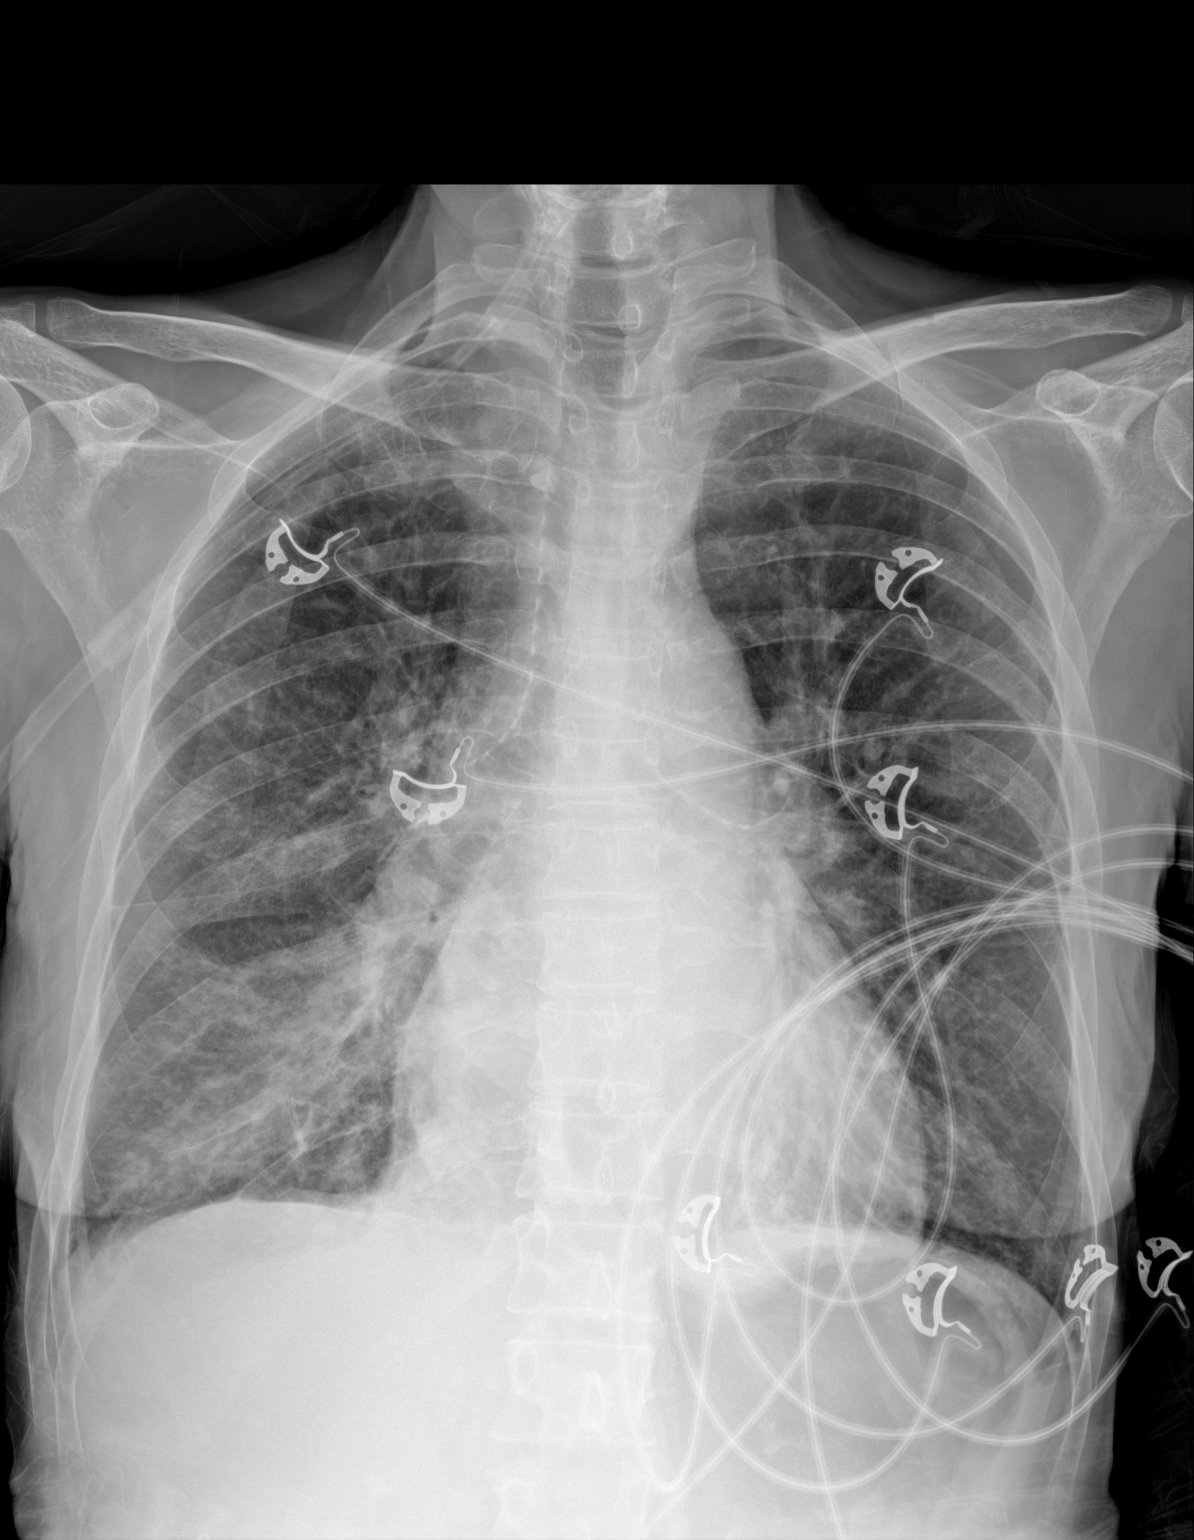

[1 of 1 positions shown; findings below may reference images not displayed]

FINDINGS: Single frontal view of the chest demonstrates an unremarkable
cardiac silhouette. Since the CT scan performed earlier today, there
is been development of central vascular congestion and patchy right
infrahilar airspace disease suspicious for edema. No effusion or
pneumothorax. No acute bony abnormalities.
IMPRESSION: 1. Worsening volume status, with central vascular congestion and
likely developing basilar edema.

## 2021-07-02 IMAGING — CT CT ABD-PELV W/ CM
2 of 5 series · 15 of 46 positions shown, 17 images · IV contrast (APPLIED)
Comparison: None.

CLINICAL DATA: Abdominal distention, nausea, vomiting common
diarrhea

EXAM:
CT ABDOMEN AND PELVIS WITH CONTRAST
TECHNIQUE: Multidetector CT imaging of the abdomen and pelvis was performed
using the standard protocol following bolus administration of
intravenous contrast.
CONTRAST:  75mL OMNIPAQUE IOHEXOL 350 MG/ML SOLN

[Series 2: axial st · axial · 0.57mm/px · z∈[-858,-528]mm · 12 of 76 slices shown, 14 images]
[im 5/76  soft-tissue]
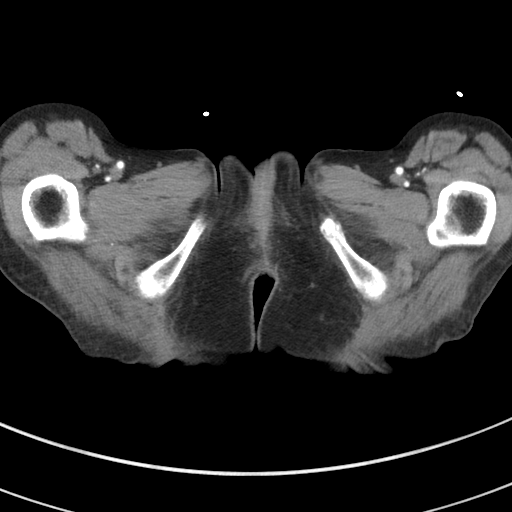
[im 5/76  bone]
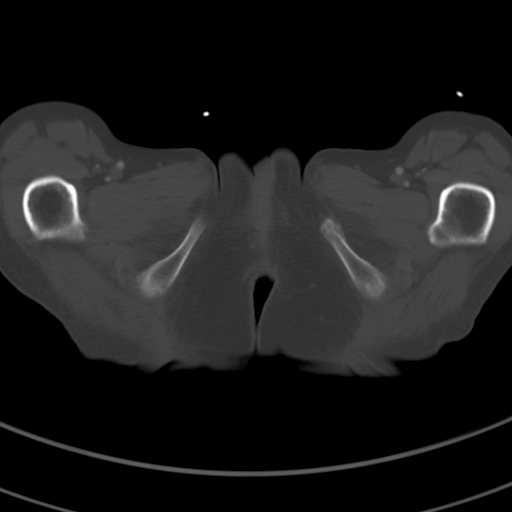
[im 14/76  soft-tissue]
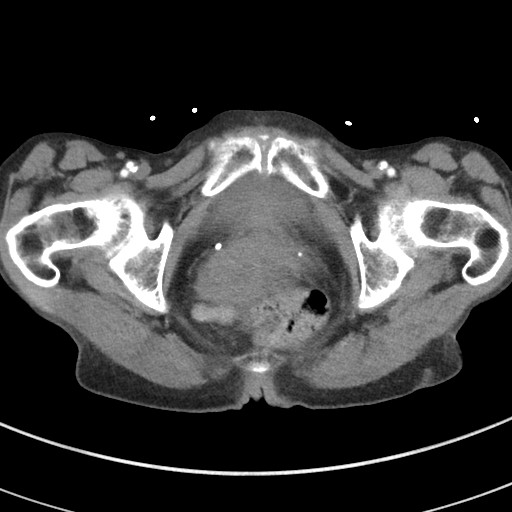
[im 18/76  soft-tissue]
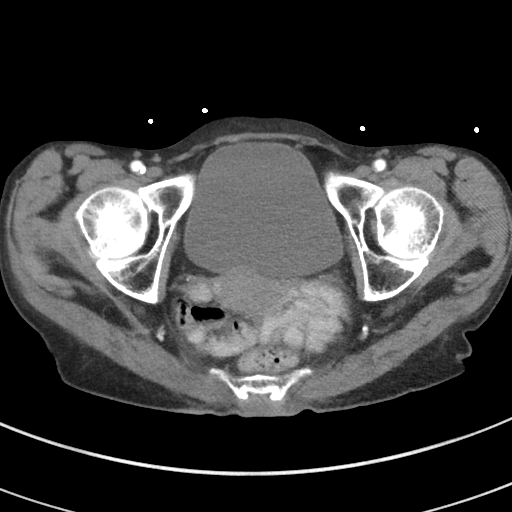
[im 23/76  soft-tissue]
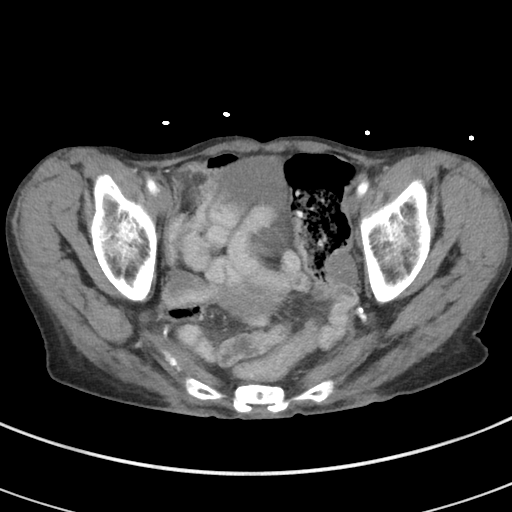
[im 31/76  soft-tissue]
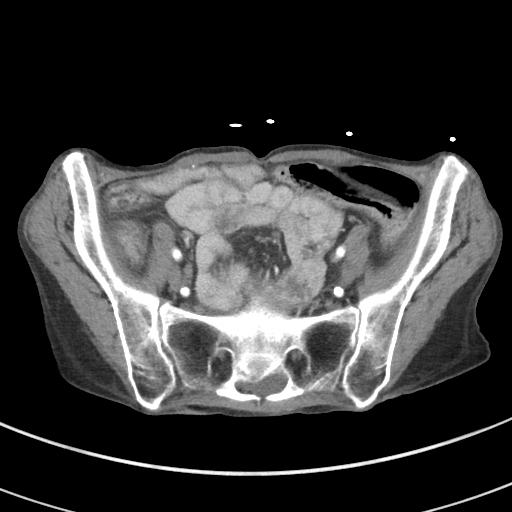
[im 36/76  soft-tissue]
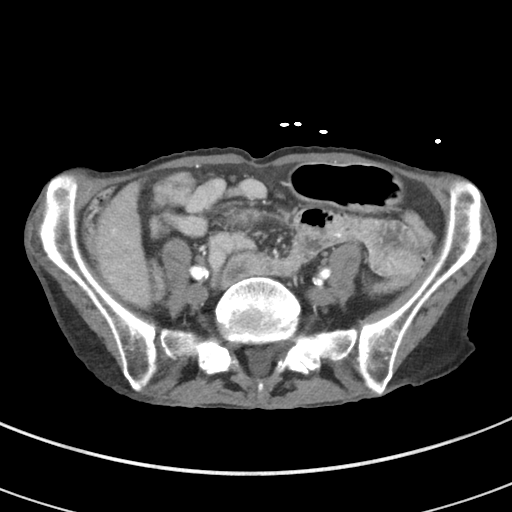
[im 40/76  soft-tissue]
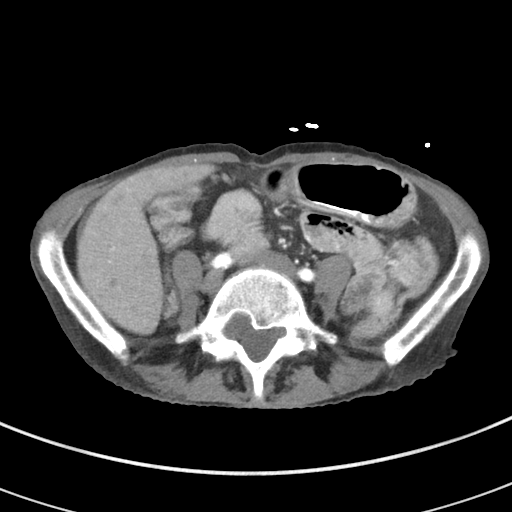
[im 49/76  soft-tissue]
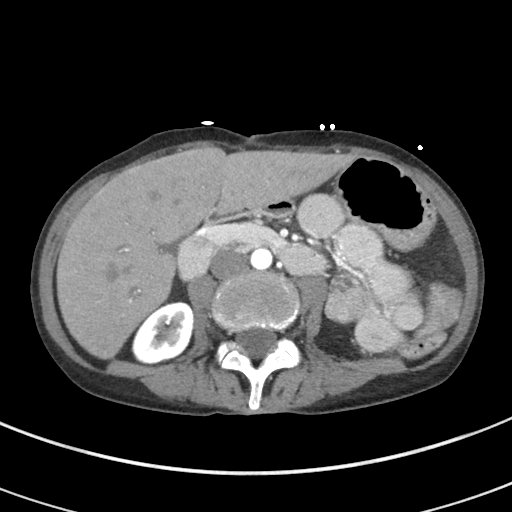
[im 53/76  soft-tissue]
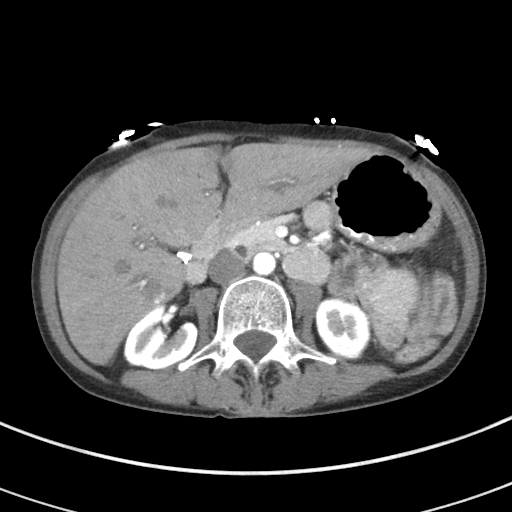
[im 53/76  bone]
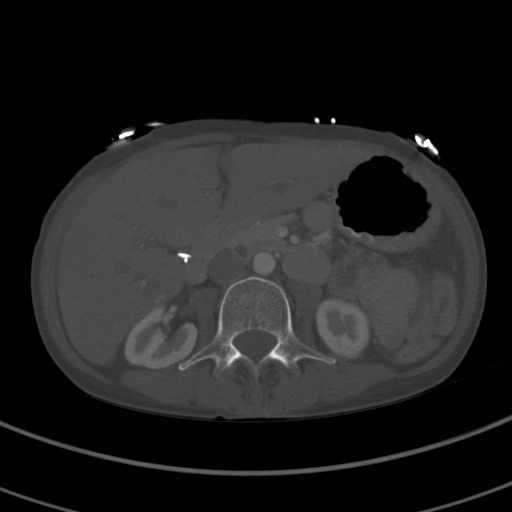
[im 58/76  soft-tissue]
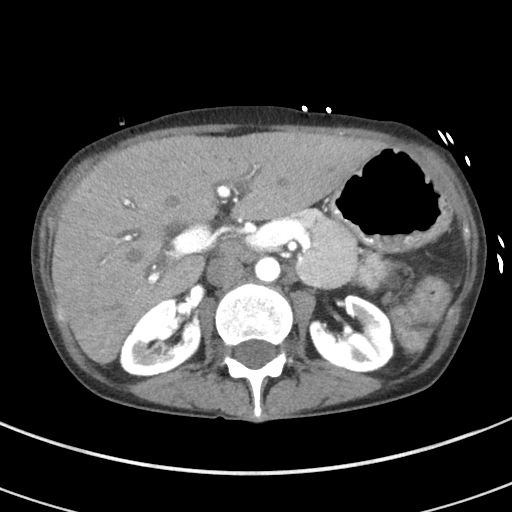
[im 67/76  soft-tissue]
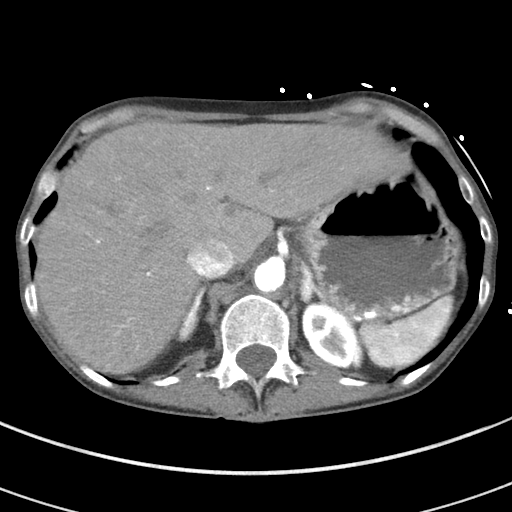
[im 71/76  soft-tissue]
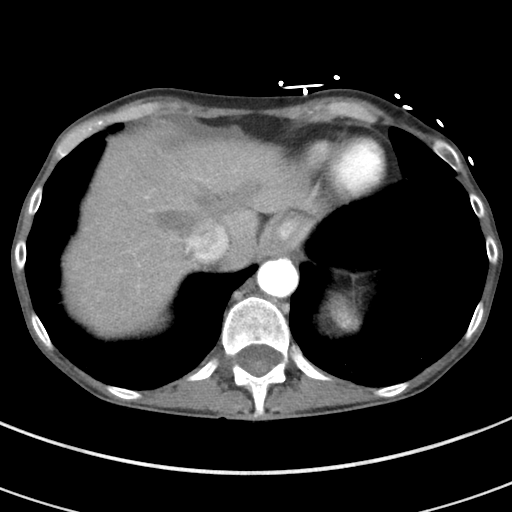

[Series 5: coronal st · coronal · 0.63mm/px · 3 of 65 slices shown]
[im 22/65  soft-tissue]
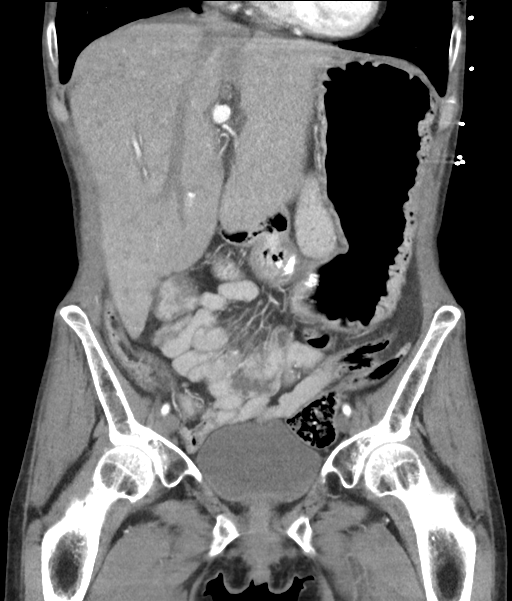
[im 29/65  soft-tissue]
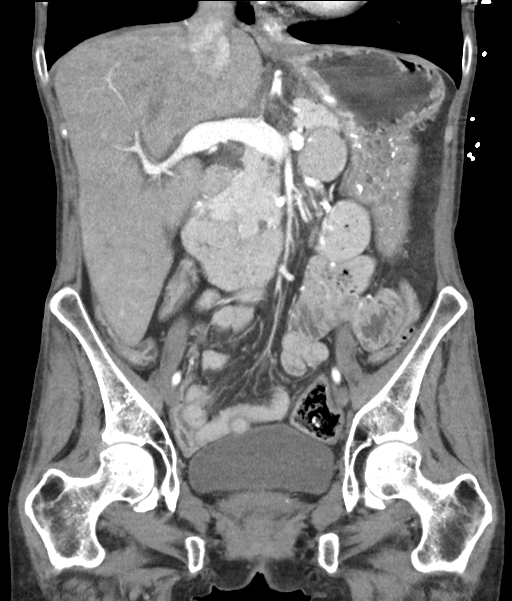
[im 36/65  soft-tissue]
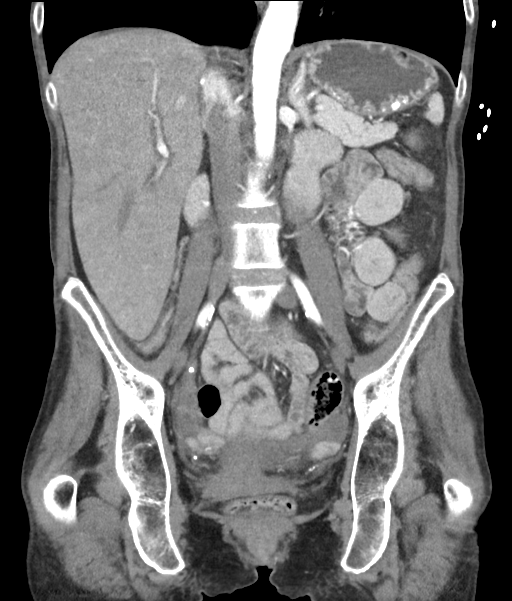

[15 of 46 positions shown; findings below may reference images not displayed]

FINDINGS: Lower chest: The lung bases are clear. The imaged heart is
unremarkable.

Hepatobiliary: The liver is enlarged there is a 1.7 cm by 1.2 cm by
1.6 cm hypodense lesion in hepatic segment VI with foci of
peripheral nodular enhancement. There is moderate periportal edema,
nonspecific.

The gallbladder is surgically absent. Mild prominence of the
extrahepatic bile ducts is likely due to the history of
cholecystectomy.

Pancreas: Unremarkable.

Spleen: Unremarkable.

Adrenals/Urinary Tract: The adrenals are unremarkable.

The kidneys are normal, with no focal lesion, stone, hydronephrosis,
or hydroureter. The bladder is unremarkable.

Stomach/Bowel: The stomach is unremarkable. There are multiple loops
of small bowel in the left upper quadrant with a moderately
thickened wall measuring up to 8 mm. There is no evidence of
mechanical bowel obstruction.

Vascular/Lymphatic: There is scattered calcified atherosclerotic
plaque throughout the nonaneurysmal abdominal aorta. The major
branch vessels are patent. There is no abdominal or pelvic
lymphadenopathy.

Reproductive: The uterus and adnexa are unremarkable.

Other: There is no ascites or free air.

Musculoskeletal: There is scalloping of the posterior endplate of
the L5 vertebral body with widening of the right neural foramen
raising suspicion for a nerve sheath tumor (6-50). Ovoid hypodense
lesions are also seen in the sacral canal. There is
age-indeterminate compression deformity of the L2 vertebral body
with approximately 30% loss of vertebral body height. There is
minimal bony retropulsion at this level.
IMPRESSION: 1. Multiple loops of small bowel with moderately thickened wall in
the left upper quadrant suggesting nonspecific infectious or
inflammatory enteritis.
2. Moderate periportal edema is nonspecific and can be seen in the
setting of heart failure, hepatitis, among other etiologies.
Correlate with liver function tests and history, and consider
correlation with hepatitis testing as indicated.
3. Indeterminate hypodense lesion in the liver with peripheral
nodular enhancement most likely reflects a hemangioma; however,
recommend nonemergent outpatient liver protocol MRI of the abdomen
with and without contrast for characterization.
4. Scalloping of the posterior endplate of the L5 vertebral body
with widening of the neural foramen raises suspicion for a
peripheral nerve sheath tumor. Recommend nonemergent lumbar spine
MRI with and without contrast for further evaluation.
5. Age-indeterminate compression deformity of the L2 vertebral body.
Correlate with point tenderness, and MRI may be considered for
evaluation of chronicity. If this MRI is obtained during this
ED/inpatient visit, postcontrast imaging is recommended for
evaluation of the suspected nerve sheath tumor.

Aortic Atherosclerosis ([HA]-[HA]).

## 2021-07-02 SURGERY — COLONOSCOPY WITH PROPOFOL
Anesthesia: General

## 2021-07-02 MED ORDER — LORAZEPAM 2 MG/ML IJ SOLN
1.0000 mg | INTRAMUSCULAR | Status: DC | PRN
  Administered 2021-07-02: 3 mg via INTRAVENOUS
  Filled 2021-07-02: qty 2

## 2021-07-02 MED ORDER — BIOTIN 1000 MCG PO TABS: 1000.0000 ug | ORAL_TABLET | Freq: Every day | ORAL | Status: DC

## 2021-07-02 MED ORDER — MAGNESIUM OXIDE -MG SUPPLEMENT 400 (240 MG) MG PO TABS
400.0000 mg | ORAL_TABLET | Freq: Every day | ORAL | Status: DC
  Administered 2021-07-02 – 2021-07-09 (×8): 400 mg via ORAL
  Filled 2021-07-02 (×8): qty 1

## 2021-07-02 MED ORDER — THIAMINE HCL 100 MG/ML IJ SOLN
100.0000 mg | Freq: Once | INTRAMUSCULAR | Status: AC
  Administered 2021-07-02: 100 mg via INTRAVENOUS
  Filled 2021-07-02: qty 2

## 2021-07-02 MED ORDER — SODIUM CHLORIDE 0.9 % IV BOLUS
1000.0000 mL | Freq: Once | INTRAVENOUS | Status: AC
  Administered 2021-07-02: 1000 mL via INTRAVENOUS

## 2021-07-02 MED ORDER — HALOPERIDOL LACTATE 5 MG/ML IJ SOLN
1.0000 mg | Freq: Once | INTRAMUSCULAR | Status: AC
  Administered 2021-07-02: 1 mg via INTRAVENOUS
  Filled 2021-07-02: qty 1

## 2021-07-02 MED ORDER — AMITRIPTYLINE HCL 25 MG PO TABS
25.0000 mg | ORAL_TABLET | Freq: Every day | ORAL | Status: DC
  Administered 2021-07-02: 25 mg via ORAL
  Filled 2021-07-02: qty 1

## 2021-07-02 MED ORDER — WOMENS 50+ ADVANCED PO CAPS: ORAL_CAPSULE | Freq: Every day | ORAL | Status: DC

## 2021-07-02 MED ORDER — POTASSIUM CHLORIDE CRYS ER 20 MEQ PO TBCR
40.0000 meq | EXTENDED_RELEASE_TABLET | Freq: Once | ORAL | Status: AC
  Administered 2021-07-02: 40 meq via ORAL
  Filled 2021-07-02: qty 2

## 2021-07-02 MED ORDER — PANTOPRAZOLE SODIUM 40 MG IV SOLR: 40.0000 mg | Freq: Two times a day (BID) | INTRAVENOUS | Status: DC

## 2021-07-02 MED ORDER — LACTATED RINGERS IV SOLN: INTRAVENOUS | Status: DC

## 2021-07-02 MED ORDER — TRAZODONE HCL 50 MG PO TABS
50.0000 mg | ORAL_TABLET | Freq: Two times a day (BID) | ORAL | Status: DC
  Administered 2021-07-02: 50 mg via ORAL
  Filled 2021-07-02 (×2): qty 1

## 2021-07-02 MED ORDER — LORAZEPAM 2 MG/ML IJ SOLN
0.5000 mg | Freq: Once | INTRAMUSCULAR | Status: AC
  Administered 2021-07-02: 0.5 mg via INTRAVENOUS
  Filled 2021-07-02: qty 1

## 2021-07-02 MED ORDER — SODIUM CHLORIDE 0.9 % IV SOLN
2.0000 g | Freq: Once | INTRAVENOUS | Status: AC
  Administered 2021-07-02: 2 g via INTRAVENOUS
  Filled 2021-07-02: qty 20

## 2021-07-02 MED ORDER — SODIUM CHLORIDE 0.9 % IV SOLN
12.5000 mg | Freq: Three times a day (TID) | INTRAVENOUS | Status: DC | PRN
  Administered 2021-07-03: 12.5 mg via INTRAVENOUS
  Filled 2021-07-02: qty 12.5
  Filled 2021-07-02 (×2): qty 0.5

## 2021-07-02 MED ORDER — METRONIDAZOLE 500 MG/100ML IV SOLN
500.0000 mg | Freq: Two times a day (BID) | INTRAVENOUS | Status: DC
Start: 2021-07-02 — End: 2021-07-03
  Administered 2021-07-02 – 2021-07-03 (×2): 500 mg via INTRAVENOUS
  Filled 2021-07-02 (×4): qty 100

## 2021-07-02 MED ORDER — ONDANSETRON 4 MG PO TBDP
4.0000 mg | ORAL_TABLET | Freq: Once | ORAL | Status: AC
  Administered 2021-07-02: 4 mg via ORAL
  Filled 2021-07-02: qty 1

## 2021-07-02 MED ORDER — DEXTROSE-NACL 5-0.9 % IV SOLN: INTRAVENOUS | Status: DC

## 2021-07-02 MED ORDER — FOLIC ACID 1 MG PO TABS
1.0000 mg | ORAL_TABLET | Freq: Every day | ORAL | Status: DC
  Administered 2021-07-03 – 2021-07-09 (×7): 1 mg via ORAL
  Filled 2021-07-02 (×7): qty 1

## 2021-07-02 MED ORDER — PANTOPRAZOLE 80MG IVPB - SIMPLE MED
80.0000 mg | Freq: Once | INTRAVENOUS | Status: AC
  Administered 2021-07-02: 80 mg via INTRAVENOUS
  Filled 2021-07-02: qty 80

## 2021-07-02 MED ORDER — HEPARIN SODIUM (PORCINE) 5000 UNIT/ML IJ SOLN: 5000.0000 [IU] | Freq: Three times a day (TID) | INTRAMUSCULAR | Status: DC

## 2021-07-02 MED ORDER — DILTIAZEM HCL 25 MG/5ML IV SOLN
5.0000 mg | Freq: Once | INTRAVENOUS | Status: AC
  Administered 2021-07-02: 5 mg via INTRAVENOUS
  Filled 2021-07-02: qty 5

## 2021-07-02 MED ORDER — CHLORHEXIDINE GLUCONATE CLOTH 2 % EX PADS
6.0000 | MEDICATED_PAD | Freq: Every day | CUTANEOUS | Status: DC
  Administered 2021-07-02 – 2021-07-06 (×5): 6 via TOPICAL
  Filled 2021-07-02: qty 6

## 2021-07-02 MED ORDER — ACETAMINOPHEN 650 MG RE SUPP: 650.0000 mg | Freq: Four times a day (QID) | RECTAL | Status: AC | PRN

## 2021-07-02 MED ORDER — ONDANSETRON HCL 4 MG/2ML IJ SOLN
4.0000 mg | Freq: Four times a day (QID) | INTRAMUSCULAR | Status: DC | PRN
Start: 2021-07-02 — End: 2021-07-09
  Administered 2021-07-06: 4 mg via INTRAVENOUS
  Filled 2021-07-02: qty 2

## 2021-07-02 MED ORDER — TRAZODONE HCL 100 MG PO TABS
300.0000 mg | ORAL_TABLET | Freq: Every day | ORAL | Status: DC
  Administered 2021-07-02: 300 mg via ORAL
  Filled 2021-07-02: qty 3

## 2021-07-02 MED ORDER — IPRATROPIUM-ALBUTEROL 0.5-2.5 (3) MG/3ML IN SOLN
3.0000 mL | Freq: Four times a day (QID) | RESPIRATORY_TRACT | Status: AC | PRN
  Administered 2021-07-02 – 2021-07-04 (×3): 3 mL via RESPIRATORY_TRACT
  Filled 2021-07-02 (×3): qty 3

## 2021-07-02 MED ORDER — VITAMIN B-12 1000 MCG PO TABS
1000.0000 ug | ORAL_TABLET | Freq: Every day | ORAL | Status: DC
  Administered 2021-07-03 – 2021-07-09 (×7): 1000 ug via ORAL
  Filled 2021-07-02 (×7): qty 1

## 2021-07-02 MED ORDER — LORAZEPAM 1 MG PO TABS: 1.0000 mg | ORAL_TABLET | ORAL | Status: DC | PRN

## 2021-07-02 MED ORDER — THIAMINE HCL 100 MG/ML IJ SOLN
100.0000 mg | Freq: Every day | INTRAMUSCULAR | Status: DC
  Filled 2021-07-02: qty 2

## 2021-07-02 MED ORDER — HYDROXYZINE HCL 50 MG PO TABS
100.0000 mg | ORAL_TABLET | Freq: Three times a day (TID) | ORAL | Status: DC | PRN
  Filled 2021-07-02: qty 2

## 2021-07-02 MED ORDER — FOLIC ACID 1 MG PO TABS
1.0000 mg | ORAL_TABLET | Freq: Once | ORAL | Status: AC
  Administered 2021-07-02: 1 mg via ORAL
  Filled 2021-07-02: qty 1

## 2021-07-02 MED ORDER — IOHEXOL 350 MG/ML SOLN
75.0000 mL | Freq: Once | INTRAVENOUS | Status: AC | PRN
  Administered 2021-07-02: 75 mL via INTRAVENOUS

## 2021-07-02 MED ORDER — POTASSIUM CHLORIDE IN NACL 20-0.9 MEQ/L-% IV SOLN
Freq: Once | INTRAVENOUS | Status: AC
  Filled 2021-07-02: qty 1000

## 2021-07-02 MED ORDER — HEPARIN (PORCINE) 25000 UT/250ML-% IV SOLN
1150.0000 [IU]/h | INTRAVENOUS | Status: AC
  Administered 2021-07-02: 450 [IU]/h via INTRAVENOUS
  Administered 2021-07-03: 850 [IU]/h via INTRAVENOUS
  Administered 2021-07-04: 1050 [IU]/h via INTRAVENOUS
  Filled 2021-07-02 (×3): qty 250

## 2021-07-02 MED ORDER — THIAMINE HCL 100 MG PO TABS
100.0000 mg | ORAL_TABLET | Freq: Every day | ORAL | Status: DC
  Administered 2021-07-03 – 2021-07-09 (×7): 100 mg via ORAL
  Filled 2021-07-02 (×7): qty 1

## 2021-07-02 MED ORDER — PANTOPRAZOLE INFUSION (NEW) - SIMPLE MED
8.0000 mg/h | INTRAVENOUS | Status: DC
  Administered 2021-07-02 – 2021-07-04 (×5): 8 mg/h via INTRAVENOUS
  Filled 2021-07-02: qty 100
  Filled 2021-07-02 (×4): qty 80

## 2021-07-02 MED ORDER — LORAZEPAM 2 MG PO TABS
2.0000 mg | ORAL_TABLET | Freq: Once | ORAL | Status: AC
  Administered 2021-07-02: 2 mg via ORAL
  Filled 2021-07-02: qty 1

## 2021-07-02 MED ORDER — DILTIAZEM HCL 25 MG/5ML IV SOLN: 10.0000 mg | Freq: Once | INTRAVENOUS | Status: DC

## 2021-07-02 MED ORDER — QUETIAPINE FUMARATE 100 MG PO TABS
100.0000 mg | ORAL_TABLET | Freq: Three times a day (TID) | ORAL | Status: DC
  Administered 2021-07-02 – 2021-07-03 (×4): 100 mg via ORAL
  Filled 2021-07-02: qty 4
  Filled 2021-07-02 (×3): qty 1

## 2021-07-02 MED ORDER — METOPROLOL TARTRATE 25 MG PO TABS
25.0000 mg | ORAL_TABLET | Freq: Two times a day (BID) | ORAL | Status: DC
  Administered 2021-07-02: 25 mg via ORAL
  Filled 2021-07-02: qty 1

## 2021-07-02 MED ORDER — ACETAMINOPHEN 325 MG PO TABS
650.0000 mg | ORAL_TABLET | Freq: Four times a day (QID) | ORAL | Status: AC | PRN
  Administered 2021-07-04: 650 mg via ORAL
  Filled 2021-07-02: qty 2

## 2021-07-02 MED ORDER — SODIUM CHLORIDE 0.9 % IV SOLN: 1.0000 g | INTRAVENOUS | Status: DC

## 2021-07-02 MED ORDER — HEPARIN BOLUS VIA INFUSION
2400.0000 [IU] | Freq: Once | INTRAVENOUS | Status: AC
  Administered 2021-07-02: 2400 [IU] via INTRAVENOUS
  Filled 2021-07-02: qty 2400

## 2021-07-02 MED ORDER — PEG 3350-KCL-NA BICARB-NACL 420 G PO SOLR
4000.0000 mL | Freq: Once | ORAL | Status: DC
  Filled 2021-07-02: qty 4000

## 2021-07-02 MED ORDER — ONDANSETRON HCL 4 MG PO TABS
4.0000 mg | ORAL_TABLET | Freq: Four times a day (QID) | ORAL | Status: DC | PRN
  Administered 2021-07-06: 4 mg via ORAL
  Filled 2021-07-02: qty 1

## 2021-07-02 MED ORDER — HEPARIN BOLUS VIA INFUSION
1100.0000 [IU] | Freq: Once | INTRAVENOUS | Status: DC
  Filled 2021-07-02: qty 1100

## 2021-07-02 MED ORDER — ADULT MULTIVITAMIN W/MINERALS CH
1.0000 | ORAL_TABLET | Freq: Every day | ORAL | Status: DC
  Administered 2021-07-02 – 2021-07-09 (×8): 1 via ORAL
  Filled 2021-07-02 (×8): qty 1

## 2021-07-02 NOTE — ED Triage Notes (Addendum)
Pt to triage via w/c, appears anxious; Pt reports N/V/D "for awhile"; sched for colonoscopy this morning; st began prep but did not finish; denies abd pain; admits to ETOH and "delta 8"

## 2021-07-02 NOTE — Progress Notes (Signed)
[facility] for IV heparin Indication: chest pain/ACS/STEMI  No Known Allergies  Patient Measurements: Height: '5\' 3"'$  (160 cm) Weight: 39 kg (86 lb) IBW/kg (Calculated) : 52.4 Heparin Dosing Weight: 39 kg  Vital Signs: BP: 90/68 (09/13 1900) Pulse Rate: 99 (09/13 1900)  Labs: Recent Labs    07/02/21 0644 07/02/21 1253 07/02/21 1304 07/02/21 1730 07/02/21 1846  HGB 13.0  --   --   --   --   HCT 37.1  --   --   --   --   PLT 428*  --   --   --   --   APTT  --   --  >200*  --   --   LABPROT  --   --  14.2  --   --   INR  --   --  1.1  --   --   HEPARINUNFRC  --   --   --   --  <0.10*  CREATININE 1.15*  --   --   --   --   TROPONINIHS 690* 1,135*  --  1,119*  --      Estimated Creatinine Clearance: 33.2 mL/min (A) (by C-G formula based on SCr of 1.15 mg/dL (H)).   Medical History: Past Medical History:  Diagnosis Date   Anxiety    Depression     Medications: none   Assessment: [name] with PMH of anxiety, depression, and tobacco use presenting with nausea and vomiting. While in ED, found to have elevated troponins. No anticoagulation PTA. ECG resulted "minimal ST elevation, lateral leads".   Goal of Therapy:  Heparin level 0.3-0.7 units/ml Monitor platelets by anticoagulation protocol: Yes   Plan:  Heparin level subtherapeutic Heparin 1100 unit bolus Increase heparin infusion to 550 units/hr Recheck HL 9/14 at 0200 Daily [place], PharmD, BCPS Clinical Pharmacist 07/02/2021 7:37 PM

## 2021-07-02 NOTE — ED Notes (Signed)
Patient rang call bell due to monitor beeping.  Patient's oxygen saturation reading at 87%.  This RN placed patient on 2L/o2.  Primary RN notified.

## 2021-07-02 NOTE — Consult Note (Signed)
[facility] Consultation Note  Patient ID: Carla Martinez, MRN: [id number], DOB/AGE: 1961-03-16 60 y.o. Admit date: 07/02/2021   Date of Consult: 07/02/2021 Primary Physician: [name], MD Primary Cardiologist: None  Chief Complaint:  Chief Complaint  Patient presents with  . Emesis   Reason for Consult:  Chest pain  HPI: 60 y.o. female with known tobacco use and borderline hypertension and hyperlipidemia with new onset nausea vomiting and abdominal discomfort as well as chest discomfort.  With her chest discomfort she was seen in the emergency room.  At that time the patient apparently had a rapid rate of atrial fibrillation which converted quickly into normal sinus rhythm with appropriate medication management intravenously.  Additionally with chest discomfort that EKG showed normal sinus rhythm left atrial enlargement lateral ST changes nonspecific.  Additionally the patient had a troponin of 690/1135/1119.  This is possibly consistent with demand ischemia versus non-ST elevation myocardial infarction.  With her abdominal pain and other nausea vomiting the patient also had a white blood cell count of 16.3 and a glomerular filtration rate of 56.  At this time with there is significant concerns of mental instability with some difficulty with conversation at this time and the patient is having difficulty talking.  She has been placed on appropriate medication management for further risk reduction and non-ST elevation myocardial infarction but likely cannot have further intervention at this time until she regained some sensorium  Past Medical History:  Diagnosis Date  . Anxiety   . Depression       Surgical History:  Past Surgical History:  Procedure Laterality Date  . CHOLECYSTECTOMY    . COLON RESECTION     12" removed  . MENISCUS REPAIR Bilateral      Home Meds: Prior to Admission medications   Medication Sig Start Date End Date Taking? Authorizing  Provider  amitriptyline (ELAVIL) 25 MG tablet Take 1 tablet (25 mg total) by mouth at bedtime. 06/04/21  Yes Vanga, [name], MD  Biotin 1000 MCG tablet Take 1,000 mcg by mouth daily.   Yes [provider]  calcium carbonate (OS-CAL - DOSED IN MG OF ELEMENTAL CALCIUM) 1250 (500 Ca) MG tablet Take 1 tablet by mouth as directed.   Yes [provider]  hydrOXYzine (ATARAX/VISTARIL) 50 MG tablet Take 2 tablets (100 mg total) by mouth 3 (three) times daily as needed for anxiety. 05/09/21  Yes [name], MD  Magnesium 500 MG TABS Take 1 tablet by mouth daily.   Yes [provider]  Multiple Vitamins-Minerals (WOMENS 50+ ADVANCED PO) Take 1 tablet by mouth daily.   Yes [provider]  Potassium 99 MG TABS Take 2 tablets by mouth daily.   Yes [provider]  QUEtiapine (SEROQUEL) 100 MG tablet Take 1 tablet (100 mg total) by mouth 3 (three) times daily. 03/22/21  Yes [name], MD  trazodone (DESYREL) 300 MG tablet Take 1 tablet (300 mg total) by mouth at bedtime. 04/25/21  Yes [name], MD  traZODone (DESYREL) 50 MG tablet Take 1 tablet (50 mg total) by mouth 2 (two) times daily. 03/15/21  Yes [name], MD  vitamin B-12 (CYANOCOBALAMIN) 1000 MCG tablet Take 1,000 mcg by mouth daily.   Yes [provider]    Inpatient Medications:  . amitriptyline  25 mg Oral QHS  . [START ON 07/03/2021] Chlorhexidine Gluconate Cloth  6 each Topical Q0600  . folic acid  1 mg Oral Daily  .  heparin  1,100 Units Intravenous Once  . magnesium oxide  400 mg Oral Daily  . multivitamin with minerals  1 tablet Oral Daily  . [START ON 07/06/2021] pantoprazole  40 mg Intravenous Q12H  . polyethylene glycol-electrolytes  4,000 mL Oral Once  . QUEtiapine  100 mg Oral TID  . thiamine  100 mg Oral Daily   Or  . thiamine  100 mg Intravenous Daily  . trazodone  300 mg Oral QHS  . traZODone  50 mg Oral BID  . [START ON 07/03/2021] vitamin B-12  1,000 mcg  Oral Daily   . heparin 550 Units/hr (07/02/21 2004)  . lactated ringers 150 mL/hr at 07/02/21 1306  . metronidazole Stopped (07/02/21 1622)  . pantoprazole 8 mg/hr (07/02/21 1407)  . promethazine (PHENERGAN) injection (IM or IVPB)      Allergies: No Known Allergies  Social History   Socioeconomic History  . Marital status: Single    Spouse name: Not on file  . Number of children: 6  . Years of education: some college  . Highest education level: Not on file  Occupational History  . Not on file  Tobacco Use  . Smoking status: Every Day    Packs/day: 0.25    Years: 17.00    Pack years: 4.25    Types: Cigarettes    Start date: 11/25/2003  . Smokeless tobacco: Never  . Tobacco comments:    used to smoke 1PPD but has decreased. Down to 1-2 cigarettes daily  Vaping Use  . Vaping Use: Never used  Substance and Sexual Activity  . Alcohol use: Yes    Comment: 3-4 glasses a night  . Drug use: Never  . Sexual activity: Not Currently  Other Topics Concern  . Not on file  Social History Narrative   02/14/21   From: moved to [facility] 2020 to be near family   Living: with mom and son who is dependent adult son with Down syndrome   Work: care giver      Family: 6 children - [name] lives with her, Surveyor, minerals (2002), - 4 living grandchildren      Enjoys: Training and development officer, Medical laboratory scientific officer      Exercise: use to exercise non-stop   Diet: good, today ate 2 hard boiled eggs, 1 meal and few snacks      Safety   Seat belts: Yes    Guns: No   Safe in relationships: Yes    Social Determinants of Radio broadcast assistant Strain: Not on file  Food Insecurity: Not on file  Transportation Needs: Not on file  Physical Activity: Not on file  Stress: Not on file  Social Connections: Not on file  Intimate Partner Violence: Not on file     Family History  Problem Relation Age of Onset  . Leukemia Sister   . Diabetes type I Child   . Heart disease Sister   . Hypertension Mother   . Heart disease Mother   .  Stroke Mother   . Down syndrome Son   . Stroke Son      Review of Systems Positive for nausea vomiting Negative for: General:  chills, fever, night sweats or weight changes.  Cardiovascular: PND orthopnea syncope dizziness  Dermatological skin lesions rashes Respiratory: Cough congestion Urologic: Frequent urination urination at night and hematuria Abdominal: Positive for nausea, vomiting, negative for diarrhea, bright red blood per rectum, melena, or hematemesis Neurologic: negative for visual changes, and/or hearing changes  All other systems reviewed and are  otherwise negative except as noted above.  Labs: No results for input(s): CKTOTAL, CKMB, TROPONINI in the last 72 hours. Lab Results  Component Value Date   WBC 16.3 (H) 07/02/2021   HGB 13.0 07/02/2021   HCT 37.1 07/02/2021   MCV 94.2 07/02/2021   PLT 428 (H) 07/02/2021    Recent Labs  Lab 07/02/21 0644  NA 137  K 3.0*  CL 84*  CO2 30  BUN 28*  CREATININE 1.15*  CALCIUM 10.6*  PROT 7.2  BILITOT 0.6  ALKPHOS 67  ALT 44  AST 67*  GLUCOSE 215*   No results found for: CHOL, HDL, LDLCALC, TRIG No results found for: DDIMER  Radiology/Studies:  CT ABDOMEN PELVIS W CONTRAST  Result Date: 07/02/2021 CLINICAL DATA:  Abdominal distention, nausea, vomiting common diarrhea EXAM: CT ABDOMEN AND PELVIS WITH CONTRAST TECHNIQUE: Multidetector CT imaging of the abdomen and pelvis was performed using the standard protocol following bolus administration of intravenous contrast. CONTRAST:  40m OMNIPAQUE IOHEXOL 350 MG/ML SOLN COMPARISON:  None. FINDINGS: Lower chest: The lung bases are clear. The imaged heart is unremarkable. Hepatobiliary: The liver is enlarged there is a 1.7 cm by 1.2 cm by 1.6 cm hypodense lesion in hepatic segment VI with foci of peripheral nodular enhancement. There is moderate periportal edema, nonspecific. The gallbladder is surgically absent. Mild prominence of the extrahepatic bile ducts is likely due  to the history of cholecystectomy. Pancreas: Unremarkable. Spleen: Unremarkable. Adrenals/Urinary Tract: The adrenals are unremarkable. The kidneys are normal, with no focal lesion, stone, hydronephrosis, or hydroureter. The bladder is unremarkable. Stomach/Bowel: The stomach is unremarkable. There are multiple loops of small bowel in the left upper quadrant with a moderately thickened wall measuring up to 8 mm. There is no evidence of mechanical bowel obstruction. Vascular/Lymphatic: There is scattered calcified atherosclerotic plaque throughout the nonaneurysmal abdominal aorta. The major branch vessels are patent. There is no abdominal or pelvic lymphadenopathy. Reproductive: The uterus and adnexa are unremarkable. Other: There is no ascites or free air. Musculoskeletal: There is scalloping of the posterior endplate of the L5 vertebral body with widening of the right neural foramen raising suspicion for a nerve sheath tumor (6-50). Ovoid hypodense lesions are also seen in the sacral canal. There is age-indeterminate compression deformity of the L2 vertebral body with approximately 30% loss of vertebral body height. There is minimal bony retropulsion at this level. IMPRESSION: 1. Multiple loops of small bowel with moderately thickened wall in the left upper quadrant suggesting nonspecific infectious or inflammatory enteritis. 2. Moderate periportal edema is nonspecific and can be seen in the setting of heart failure, hepatitis, among other etiologies. Correlate with liver function tests and history, and consider correlation with hepatitis testing as indicated. 3. Indeterminate hypodense lesion in the liver with peripheral nodular enhancement most likely reflects a hemangioma; however, recommend nonemergent outpatient liver protocol MRI of the abdomen with and without contrast for characterization. 4. 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 Recommend nonemergent lumbar spine MRI with and without contrast for further evaluation. 5. Age-indeterminate compression deformity of the L2 vertebral body. Correlate with point tenderness, and MRI may be considered for evaluation of chronicity. If this MRI is obtained during this ED/inpatient visit, postcontrast imaging is recommended for evaluation of the suspected nerve sheath tumor. Aortic Atherosclerosis (ICD10-I70.0). Electronically Signed   By: PValetta MoleM.D.   On: 07/02/2021 11:19   DG Chest  Port 1 View  Result Date: 07/02/2021 CLINICAL DATA:  Nausea, vomiting, diarrhea EXAM: PORTABLE CHEST 1 VIEW COMPARISON:  None. FINDINGS: Single frontal view of the chest demonstrates an unremarkable cardiac silhouette. Since the CT scan performed earlier today, there is been development of central vascular congestion and patchy right infrahilar airspace disease suspicious for edema. No effusion or pneumothorax. No acute bony abnormalities. IMPRESSION: 1. Worsening volume status, with central vascular congestion and likely developing basilar edema. Electronically Signed   By: Randa Ngo M.D.   On: 07/02/2021 18:03    EKG: Normal sinus rhythm left atrial enlargement lateral ST changes  Weights: Filed Weights   07/02/21 0633  Weight: 39 kg     Physical Exam: Blood pressure (!) 175/157, pulse (!) 109, temperature 98 F (36.7 C), temperature source Oral, resp. rate (!) 26, height '5\' 3"'$  (1.6 m), weight 39 kg, SpO2 91 %. Body mass index is 15.23 kg/m. General: Well developed, well nourished, in no acute distress. Head eyes ears nose throat: Normocephalic, atraumatic, sclera non-icteric, no xanthomas, nares are without discharge. No apparent thyromegaly and/or mass  Lungs: Normal respiratory effort.  no wheezes, no rales, no rhonchi.  Heart: RRR with normal S1 S2. no murmur gallop, no rub, PMI is normal size and placement, carotid upstroke normal without bruit,  jugular venous pressure is normal Abdomen: Soft, non-tender, non-distended with normoactive bowel sounds. No hepatomegaly. No rebound/guarding. No obvious abdominal masses. Abdominal aorta is normal size without bruit Extremities: No edema. no cyanosis, no clubbing, no ulcers  Peripheral : 2+ bilateral upper extremity pulses, 2+ bilateral femoral pulses, 2+ bilateral dorsal pedal pulse Neuro: Not alert and oriented. No facial asymmetry. No focal deficit. Moves all extremities spontaneously. Musculoskeletal: Normal muscle tone without kyphosis Psych: Does not responds to questions appropriately with a normal affect.    Assessment: 60 year old female with hypertension hyperlipidemia with acute on nausea vomiting and white blood cell count elevation with atrial fibrillation now converted early into normal sinus rhythm with elevated troponin and nonspecific changes on EKG possibly consistent with non-ST ovation myocardial infarction although concerns for patient's mental status at this time  Plan: 1.  Further supportive care for mental status changes and concerns for issues needing medication management 2.  Continue heparin for further risk reduction of possible non-ST elevation myocardial infarction and addition of beta-blocker if able for better heart rate control the patient will take pills 3.  Echocardiogram for LV systolic dysfunction valvular heart disease contributing to above 4.  Would consider the possibility of cardiac catheterization to assess coronary anatomy and further treatment thereof is necessary although patient will need to have improvements of mental status before venturing in that direction 5.  High intensity cholesterol therapy as well as aspirin  Signed, Corey Skains M.D. [facility] 07/02/2021, 10:03 PM

## 2021-07-02 NOTE — ED Provider Notes (Signed)
[facility] Emergency Department Provider Note  ____________________________________________   Event Date/Time   First MD Initiated Contact with Patient 07/02/21 [phone]     (approximate)  I have reviewed the triage vital signs and the nursing notes.   HISTORY  Chief Complaint Emesis    HPI Carla Martinez is a 60 y.o. female with history of bowel resection who comes in with concern for nausea vomiting diarrhea for a while.  Patient was scheduled for colonoscopy this morning.  Patient states that she has been very sad since her daughter died and she has been trying to cope with it.  She states that she drinks a bottle of NyQuil daily for the past 3 or 4 days as well as using delta 8 Gummies.  Patient states she is not doing this to try to hurt herself but that she just uses it should help calm down at the end of the day.  She states occasional drink some wine but denies it daily.  She stated that she is post to have a colonoscopy today because she is been having a lot of abdominal fullness, weight loss, not wanting to eat.  She stated that she started to do the prep but she cannot complete it.  She is had a significant mount of nausea, vomiting, diarrhea that is been worsened by the prep.  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 Authorizing Provider  amitriptyline (ELAVIL) 25 MG tablet Take 1 tablet (25 mg total) by mouth at bedtime. 06/04/21   Lin Landsman, MD  Biotin 1000 MCG tablet Take 1,000 mcg by mouth daily.    [provider]  hydrOXYzine (ATARAX/VISTARIL) 50 MG tablet Take 2 tablets (100 mg total) by mouth 3 (three) times daily as needed for anxiety. 05/09/21   [name], MD  Multiple Vitamins-Minerals (WOMENS 50+ ADVANCED PO) Take 1 tablet by mouth daily.    [provider]  Potassium 99 MG TABS Take 2 tablets by mouth daily.    [provider]  QUEtiapine (SEROQUEL) 100 MG tablet Take 1 tablet (100 mg total) by mouth 3 (three) times daily. 03/22/21   [name], MD  trazodone (DESYREL) 300 MG tablet Take 1 tablet (300 mg total) by mouth at bedtime. 04/25/21   [name], MD  traZODone (DESYREL) 50 MG tablet Take 1 tablet (50 mg total) by mouth 2 (two) times daily. Patient taking differently: Take 50 mg by mouth 3 (three) times daily. 03/15/21   [name], MD    Allergies Patient has no known allergies.  Family History  Problem Relation Age of Onset   Leukemia Sister    Diabetes type I Child    Heart disease Sister    Hypertension Mother    Heart disease Mother    Stroke Mother    Down syndrome Son    Stroke Son     Social History Social History   Tobacco Use   Smoking status: Every Day    Packs/day: 0.25    Years: 17.00    Pack years: 4.25    Types: Cigarettes    Start date: 11/25/2003   Smokeless tobacco: Never   Tobacco comments:    used to smoke 1PPD but has decreased. Down to 1-2 cigarettes daily  Vaping Use   Vaping Use: Never used  Substance Use Topics   Alcohol use: Yes    Comment: 3-4 glasses a night   Drug use: Never      Review of Systems Constitutional: No fever/chills Eyes: No visual  changes. ENT: No sore throat. Cardiovascular: Denies chest pain. Respiratory: Denies shortness of breath. Gastrointestinal: Nausea, vomiting, diarrhea, abdominal pain Genitourinary: Negative for dysuria. Musculoskeletal: Negative for back pain. Skin: Negative for rash. Neurological: Negative for headaches, focal weakness or numbness. All other ROS negative ____________________________________________   PHYSICAL EXAM:  VITAL SIGNS: ED Triage Vitals  Enc Vitals Group     BP 07/02/21 0636 (!) 99/57     Pulse Rate 07/02/21 0636 99     Resp 07/02/21 0636 20     Temp 07/02/21 0636 98.5 F (36.9 C)     Temp Source 07/02/21 0636 Oral     SpO2 07/02/21 0636 100 %     Weight 07/02/21 0633 86 lb (39 kg)     Height 07/02/21 0633 '5\' 3"'$  (1.6 m)     Head Circumference --      Peak Flow --      Pain Score 07/02/21 0633 0     Pain Loc --      Pain Edu? --      Excl. in [place]? --     Constitutional: Alert and oriented. Well appearing and in no acute distress. Eyes: Conjunctivae are normal. EOMI. Head: Atraumatic. Nose: No congestion/rhinnorhea. Mouth/Throat: Mucous membranes are dry Neck: No stridor. Trachea Midline. FROM Cardiovascular: Normal rate, regular rhythm. Grossly normal heart sounds.  Good peripheral circulation. Respiratory: Normal respiratory effort.  No retractions. Lungs CTAB. Gastrointestinal: Tender to the lower abdomen no distention. No abdominal bruits.  Musculoskeletal: No lower extremity tenderness nor edema.  No joint effusions. Neurologic:  Normal speech and language. No gross focal neurologic deficits are appreciated.  Skin:  Skin is warm, dry and intact. No rash noted. Psychiatric: Mood and affect are normal. Speech and behavior are normal.  Patient is tearful about the loss of her daughter but denies SI GU: Deferred   ____________________________________________   LABS (all labs ordered are listed, but only abnormal results are displayed)  Labs Reviewed   CBC WITH DIFFERENTIAL/PLATELET - Abnormal; Notable for the following components:      Result Value   WBC 16.3 (*)    Platelets 428 (*)    Neutro Abs 14.3 (*)    Abs Immature Granulocytes 0.09 (*)    All other components within normal limits  COMPREHENSIVE METABOLIC PANEL - Abnormal; Notable for the following components:   Potassium 3.0 (*)    Chloride 84 (*)    Glucose, Bld 215 (*)    BUN 28 (*)    Creatinine, Ser 1.15 (*)  Calcium 10.6 (*)    AST 67 (*)    GFR, Estimated 56 (*)    Anion gap 23 (*)    All other components within normal limits  LIPASE, BLOOD  ETHANOL  URINALYSIS, ROUTINE W REFLEX MICROSCOPIC  URINE DRUG SCREEN, QUALITATIVE ([facility] ONLY)   ____________________________________________   ED ECG REPORT I, Vanessa [place], the attending physician, personally viewed and interpreted this ECG.  Normal sinus rate of 88, no ST elevation, no T wave inversion, QTC prolonged at 501 and type I AV block  Repeat EKG shows A. fib with a rate of 154, no ST elevation, T wave inversions V5, V6 2 3 aVF with widened QRS ____________________________________________  RADIOLOGY   Official radiology report(s): CT ABDOMEN PELVIS W CONTRAST  Result Date: 07/02/2021 CLINICAL DATA:  Abdominal distention, nausea, vomiting common diarrhea EXAM: CT ABDOMEN AND PELVIS WITH CONTRAST TECHNIQUE: Multidetector CT imaging of the abdomen and pelvis was performed using the standard protocol following bolus administration of intravenous contrast. CONTRAST:  60m OMNIPAQUE IOHEXOL 350 MG/ML SOLN COMPARISON:  None. FINDINGS: Lower chest: The lung bases are clear. The imaged heart is unremarkable. Hepatobiliary: The liver is enlarged there is a 1.7 cm by 1.2 cm by 1.6 cm hypodense lesion in hepatic segment VI with foci of peripheral nodular enhancement. There is moderate periportal edema, nonspecific. The gallbladder is surgically absent. Mild prominence of the extrahepatic bile ducts is likely due to the  history of cholecystectomy. Pancreas: Unremarkable. Spleen: Unremarkable. Adrenals/Urinary Tract: The adrenals are unremarkable. The kidneys are normal, with no focal lesion, stone, hydronephrosis, or hydroureter. The bladder is unremarkable. Stomach/Bowel: The stomach is unremarkable. There are multiple loops of small bowel in the left upper quadrant with a moderately thickened wall measuring up to 8 mm. There is no evidence of mechanical bowel obstruction. Vascular/Lymphatic: There is scattered calcified atherosclerotic plaque throughout the nonaneurysmal abdominal aorta. The major branch vessels are patent. There is no abdominal or pelvic lymphadenopathy. Reproductive: The uterus and adnexa are unremarkable. Other: There is no ascites or free air. Musculoskeletal: There is scalloping of the posterior endplate of the L5 vertebral body with widening of the right neural foramen raising suspicion for a nerve sheath tumor (6-50). Ovoid hypodense lesions are also seen in the sacral canal. There is age-indeterminate compression deformity of the L2 vertebral body with approximately 30% loss of vertebral body height. There is minimal bony retropulsion at this level. IMPRESSION: 1. Multiple loops of small bowel with moderately thickened wall in the left upper quadrant suggesting nonspecific infectious or inflammatory enteritis. 2. Moderate periportal edema is nonspecific and can be seen in the setting of heart failure, hepatitis, among other etiologies. Correlate with liver function tests and history, and consider correlation with hepatitis testing as indicated. 3. Indeterminate hypodense lesion in the liver with peripheral nodular enhancement most likely reflects a hemangioma; however, recommend nonemergent outpatient liver protocol MRI of the abdomen with and without contrast for characterization. 4. 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 Recommend nonemergent lumbar spine MRI with and without contrast for further evaluation. 5. Age-indeterminate compression deformity of the L2 vertebral body. Correlate with point tenderness, and MRI may be considered for evaluation of chronicity. If this MRI is obtained during this ED/inpatient visit, postcontrast imaging is recommended for evaluation of the suspected nerve sheath tumor. Aortic Atherosclerosis (ICD10-I70.0). Electronically Signed   By: PCourt JoyD.  On: 07/02/2021 11:19    ____________________________________________   PROCEDURES  Procedure(s) performed (including Critical Care):  .1-3 Lead EKG Interpretation Performed by: Vanessa Bancroft, MD Authorized by: Vanessa Garland, MD     Interpretation: abnormal     ECG rate:  150s   ECG rate assessment: tachycardic     Rhythm: atrial fibrillation     Ectopy: none     Conduction: normal   Comments:     Initially patient appeared sinus rhythm but then went into a atrial fibrillation.   ____________________________________________   INITIAL IMPRESSION / ASSESSMENT AND PLAN / ED COURSE  Carla Martinez was evaluated in Emergency Department on 07/02/2021 for the symptoms described in the history of present illness. She was evaluated in the context of the global COVID-19 pandemic, which necessitated consideration that the patient might be at risk for infection with the SARS-CoV-2 virus that causes COVID-19. Institutional protocols and algorithms that pertain to the evaluation of patients at risk for COVID-19 are in a state of rapid change based on information released by regulatory bodies including the CDC and federal and state organizations. These policies and algorithms were followed during the patient's care in the ED.    Patient comes in with hypotension, nausea vomiting diarrhea.  Slightly tender in her lower abdomen.  Will get CT scan admission of the perforation, appendicitis obstruction  or other acute pathology.  Patient does look extremely dehydrated and labs confirm this with an elevated anion gap 23.  Her sugars are slightly elevated in the 200s but she does not have a history of diabetes.  Patient was initially hypotensive and given some fluids and then went into an A. fib with RVR with rates in the 150s.  Given I suspect this is most likely from dehydration I continue to give fluids but heart rates have stayed in the 140s after 1 L bolus therefore will give an additional dose of diltiazem.  Patient's troponin was elevated at 690.  This could be demand in nature but will start on heparin after CT scan does not show anything that surgical nature. Pt deny any SI and no indication for IVC.  She declines psychiatric evaluation.  I did tell her that taking that much NyQuil can be very dangerous and I did get a Tylenol level just to make sure it was negative.  Patient blood pressures were in the 80s but patient states that this is her baseline.  I reviewed her records and it is her baseline.  Her CT imaging was negative for any other acute pathology although there was some incidental finding they recommended a hepatitis screen as well as outpatient MRIs of her spine and her abdomen.  I did discuss this with patient and she will follow-up with these.  She has no new back pain or recent falls to suggest an acute lumbar fracture.  Her troponin was significantly elevated and she denies any history of bleeding so we will start her on a heparin drip  After the 5 mg of diltiazem patient went back into normal sinus.  We will discussed the hospital team for admission.    ____________________________________________   FINAL CLINICAL IMPRESSION(S) / ED DIAGNOSES   Final diagnoses:  Nausea vomiting and diarrhea  Atrial fibrillation with rapid ventricular response (HCC)  Dehydration      MEDICATIONS GIVEN DURING THIS VISIT:  Medications  heparin ADULT infusion 100 units/mL (25000  units/233m) (450 Units/hr Intravenous New Bag/Given 07/02/21 1229)  acetaminophen (TYLENOL) tablet 650 mg (  has no administration in time range)    Or  acetaminophen (TYLENOL) suppository 650 mg (has no administration in time range)  ondansetron (ZOFRAN) tablet 4 mg (has no administration in time range)    Or  ondansetron (ZOFRAN) injection 4 mg (has no administration in time range)  LORazepam (ATIVAN) tablet 1-4 mg (has no administration in time range)    Or  LORazepam (ATIVAN) injection 1-4 mg (has no administration in time range)  thiamine tablet 100 mg ( Oral See Alternative 07/02/21 1224)    Or  thiamine (B-1) injection 100 mg (100 mg Intravenous Not Given 0000000 [id number])  folic acid (FOLVITE) tablet 1 mg (1 mg Oral Not Given 07/02/21 1224)  multivitamin with minerals tablet 1 tablet (has no administration in time range)  lactated ringers infusion (has no administration in time range)  ondansetron (ZOFRAN-ODT) disintegrating tablet 4 mg (4 mg Oral Given 07/02/21 0643)  sodium chloride 0.9 % bolus 1,000 mL (0 mLs Intravenous Stopped 07/02/21 1055)  thiamine (B-1) injection 100 mg (100 mg Intravenous Given 0000000 [id number])  folic acid (FOLVITE) tablet 1 mg (1 mg Oral Given 07/02/21 0937)  LORazepam (ATIVAN) injection 0.5 mg (0.5 mg Intravenous Given 07/02/21 0920)  potassium chloride SA (KLOR-CON) CR tablet 40 mEq (40 mEq Oral Given 07/02/21 0937)  0.9 % NaCl with KCl 20 mEq/ L  infusion ( Intravenous New Bag/Given 07/02/21 1147)  iohexol (OMNIPAQUE) 350 MG/ML injection 75 mL (75 mLs Intravenous Contrast Given 07/02/21 1036)  sodium chloride 0.9 % bolus 1,000 mL (0 mLs Intravenous Stopped 07/02/21 1249)  diltiazem (CARDIZEM) injection 5 mg (5 mg Intravenous Given 07/02/21 1138)  heparin bolus via infusion 2,400 Units (2,400 Units Intravenous Bolus from Bag 07/02/21 1230)     ED Discharge Orders     None        Note:  This document was prepared using Dragon voice recognition software and may  include unintentional dictation errors.    Vanessa [place], MD 07/02/21 1253

## 2021-07-02 NOTE — ED Notes (Signed)
Pt attempted to use bedside commode. Pt HR in 150s after walking in room.

## 2021-07-02 NOTE — ED Notes (Signed)
Pt requesting a sedative so she can sleep

## 2021-07-02 NOTE — Consult Note (Signed)
Carla Antigua, MD [address] [place], [place], [place], [address] [address], [place], [place], [place], [address] Phone: [phone]  Fax: [phone]  Consultation  Referring Provider:     Dr. Tobie Poet Primary Care Physician:  [name], MD Reason for Consultation:     Diarrhea, nausea vomiting  Date of Admission:  07/02/2021 Date of Consultation:  07/02/2021         HPI:   Carla Martinez is a 60 y.o. female reports 28-monthhistory of diarrhea, nausea vomiting.  Intermittently.  Reports nausea vomiting occurs 3-4 times a week.  No hematemesis.  Patient recently seen by Dr. VMarius Ditchas an outpatient for her symptoms and EGD and colonoscopy were planned for today given that her infectious work-up recently has been negative.  However, patient states she was only able to drink half of her prep, and she vomited some of it, and could not tolerate the rest of her prep and due to her ongoing nausea vomiting and diarrhea, she presented to the ER.  She reports dark stools but states they were dark brown and confirms that they were not black on my questioning today.  No hematemesis.  No hematochezia.  Reports 3-4 bowel movements a day most days of the week for the last 6 months.  Patient reports using 4 ibuprofen PMs as needed.  Last use was 2 to 3 days ago, but does not use it daily.  About once a week.  As per Dr. VVerlin Grillsnotes, she had a colon resection, with [facility] [place] to history of intestinal prolapse.  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 Anxiety   . Depression     Past Surgical History:  Procedure  Laterality Date  . CHOLECYSTECTOMY    . COLON RESECTION     12" removed  . MENISCUS REPAIR Bilateral     Prior to Admission medications   Medication Sig Start Date End Date Taking? Authorizing Provider  amitriptyline (ELAVIL) 25 MG tablet Take 1 tablet (25 mg total) by mouth at bedtime. 06/04/21  Yes Vanga, RTally Due MD  Biotin 1000 MCG tablet Take 1,000 mcg by mouth daily.   Yes [provider]  calcium carbonate (OS-CAL - DOSED IN MG OF ELEMENTAL CALCIUM) 1250 (500 Ca) MG tablet Take 1 tablet by mouth as directed.   Yes [provider]  hydrOXYzine (ATARAX/VISTARIL) 50 MG tablet Take 2 tablets (100 mg total) by mouth 3 (three) times daily as needed for anxiety. 05/09/21  Yes CLesleigh Noe MD  Magnesium 500 MG TABS Take 1 tablet by mouth daily.   Yes [provider]  Multiple Vitamins-Minerals (WOMENS 50+ ADVANCED PO) Take 1 tablet by mouth daily.   Yes [provider]  Potassium 99 MG TABS Take 2 tablets by mouth daily.   Yes [provider]  QUEtiapine (SEROQUEL) 100 MG tablet Take 1 tablet (100 mg total) by mouth 3 (three) times daily. 03/22/21  Yes CLesleigh Noe MD  trazodone (DESYREL) 300 MG tablet Take 1 tablet (300 mg total) by mouth at bedtime. 04/25/21  Yes CLesleigh Noe MD  traZODone (  DESYREL) 50 MG tablet Take 1 tablet (50 mg total) by mouth 2 (two) times daily. 03/15/21  Yes [name], MD  vitamin B-12 (CYANOCOBALAMIN) 1000 MCG tablet Take 1,000 mcg by mouth daily.   Yes [provider]    Family History  Problem Relation Age of Onset  . Leukemia Sister   . Diabetes type I Child   . Heart disease Sister   . Hypertension Mother   . Heart disease Mother   . Stroke Mother   . Down syndrome Son   . Stroke Son      Social History   Tobacco Use  . Smoking status: Every Day    Packs/day: 0.25    Years: 17.00    Pack years: 4.25    Types: Cigarettes    Start date: 11/25/2003  . Smokeless tobacco: Never  .  Tobacco comments:    used to smoke 1PPD but has decreased. Down to 1-2 cigarettes daily  Vaping Use  . Vaping Use: Never used  Substance Use Topics  . Alcohol use: Yes    Comment: 3-4 glasses a night  . Drug use: Never    Allergies as of 07/02/2021  . (No Known Allergies)    Review of Systems:    All systems reviewed and negative except where noted in HPI.   Physical Exam:  Constitutional: General:   Alert,  Well-developed, well-nourished, pleasant and cooperative in NAD BP 94/78   Pulse (!) 112   Temp 98.5 F (36.9 C) (Oral)   Resp 15   Ht '5\' 3"'$  (1.6 m)   Wt 39 kg   SpO2 91%   BMI 15.23 kg/m   Eyes:  Sclera clear, no icterus.   Conjunctiva pink. PERRLA  Ears:  No scars, lesions or masses, Normal auditory acuity. Nose:  No deformity, discharge, or lesions. Mouth:  No deformity or lesions, oropharynx pink & moist.  Neck:  Supple; no masses or thyromegaly.  Respiratory: Normal respiratory effort, Normal percussion  Gastrointestinal:  Normal bowel sounds.  No bruits.  Soft, non-tender and non-distended without masses, hepatosplenomegaly or hernias noted.  No guarding or rebound tenderness.     Cardiac: No clubbing or edema.  No cyanosis. Normal posterior tibial pedal pulses noted.  Lymphatic:  No significant cervical or axillary adenopathy.  Psych:  Alert and cooperative. Normal mood and affect.  Musculoskeletal:  Normal gait. Head normocephalic, atraumatic. Symmetrical without gross deformities. 5/5 Upper and Lower extremity strength bilaterally.  Skin: Warm. Intact without significant lesions or rashes. No jaundice.  Neurologic:  Face symmetrical, tongue midline, Normal sensation to touch;  grossly normal neurologically.  Psych:  Alert and oriented x3, Alert and cooperative. Normal mood and affect.   LAB RESULTS: Recent Labs    07/02/21 0644  WBC 16.3*  HGB 13.0  HCT 37.1  PLT 428*   BMET Recent Labs    07/02/21 0644  NA 137  K 3.0*  CL 84*   CO2 30  GLUCOSE 215*  BUN 28*  CREATININE 1.15*  CALCIUM 10.6*   LFT Recent Labs    07/02/21 0644  PROT 7.2  ALBUMIN 4.3  AST 67*  ALT 44  ALKPHOS 67  BILITOT 0.6   PT/INR No results for input(s): LABPROT, INR in the last 72 hours.  STUDIES: CT ABDOMEN PELVIS W CONTRAST  Result Date: 07/02/2021 CLINICAL DATA:  Abdominal distention, nausea, vomiting common diarrhea EXAM: CT ABDOMEN AND PELVIS WITH CONTRAST TECHNIQUE: Multidetector CT imaging of the abdomen and pelvis  was performed using the standard protocol following bolus administration of intravenous contrast. CONTRAST:  64m OMNIPAQUE IOHEXOL 350 MG/ML SOLN COMPARISON:  None. FINDINGS: Lower chest: The lung bases are clear. The imaged heart is unremarkable. Hepatobiliary: The liver is enlarged there is a 1.7 cm by 1.2 cm by 1.6 cm hypodense lesion in hepatic segment VI with foci of peripheral nodular enhancement. There is moderate periportal edema, nonspecific. The gallbladder is surgically absent. Mild prominence of the extrahepatic bile ducts is likely due to the history of cholecystectomy. Pancreas: Unremarkable. Spleen: Unremarkable. Adrenals/Urinary Tract: The adrenals are unremarkable. The kidneys are normal, with no focal lesion, stone, hydronephrosis, or hydroureter. The bladder is unremarkable. Stomach/Bowel: The stomach is unremarkable. There are multiple loops of small bowel in the left upper quadrant with a moderately thickened wall measuring up to 8 mm. There is no evidence of mechanical bowel obstruction. Vascular/Lymphatic: There is scattered calcified atherosclerotic plaque throughout the nonaneurysmal abdominal aorta. The major branch vessels are patent. There is no abdominal or pelvic lymphadenopathy. Reproductive: The uterus and adnexa are unremarkable. Other: There is no ascites or free air. Musculoskeletal: There is scalloping of the posterior endplate of the L5 vertebral body with widening of the right neural foramen  raising suspicion for a nerve sheath tumor (6-50). Ovoid hypodense lesions are also seen in the sacral canal. There is age-indeterminate compression deformity of the L2 vertebral body with approximately 30% loss of vertebral body height. There is minimal bony retropulsion at this level. IMPRESSION: 1. Multiple loops of small bowel with moderately thickened wall in the left upper quadrant suggesting nonspecific infectious or inflammatory enteritis. 2. Moderate periportal edema is nonspecific and can be seen in the setting of heart failure, hepatitis, among other etiologies. Correlate with liver function tests and history, and consider correlation with hepatitis testing as indicated. 3. Indeterminate hypodense lesion in the liver with peripheral nodular enhancement most likely reflects a hemangioma; however, recommend nonemergent outpatient liver protocol MRI of the abdomen with and without contrast for characterization. 4. 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 Recommend nonemergent lumbar spine MRI with and without contrast for further evaluation. 5. Age-indeterminate compression deformity of the L2 vertebral body. Correlate with point tenderness, and MRI may be considered for evaluation of chronicity. If this MRI is obtained during this ED/inpatient visit, postcontrast imaging is recommended for evaluation of the suspected nerve sheath tumor. Aortic Atherosclerosis (ICD10-I70.0). Electronically Signed   By: PValetta MoleM.D.   On: 07/02/2021 11:19      Impression / Plan:   RAbygail Gangloffis a 60y.o. y/o female with 664-monthistory of nausea vomiting and diarrhea with EGD and colonoscopy that was planned as an outpatient, but patient presents with inability to tolerate prep due to nausea vomiting, and ongoing symptoms with infectious work-up and fecal pancreatic elastase recently normal, with  borderline elevated fecal calprotectin  Given ongoing symptoms, would recommend proceeding with EGD and colonoscopy as an inpatient  We will plan for EGD and colonoscopy tomorrow to evaluate patient's nausea vomiting and diarrhea.   Patient has been started on as needed Zofran, and this may allow her to complete her colonoscopy prep  Her CT scan report was reviewed and does show thickened small bowel loops suggesting nonspecific infectious or inflammatory enteritis.  We will try to evaluate the terminal ileum during colonoscopy.  Since infectious work-up has been negative and patient has  ongoing symptoms, causing hyperkalemia, appropriate to proceed with procedures at this time instead of conservative management.  She is noted to have hypokalemia with potassium of 3 this morning.  Continue monitoring potassium, and replace as needed.  Can consider adding to IV fluids, or oral replacement  Anesthesia will evaluate the patient prior to the procedure as well, to determine if they are comfortable with proceeding with patient's hypokalemia.  Typically, they target potassium of 3 or above, depending on the anesthesiologist on staff  I have discussed alternative options, risks & benefits,  which include, but are not limited to, bleeding, infection, perforation,respiratory complication & drug reaction.  The patient agrees with this plan & written consent will be obtained.    Stool studies were ordered on presentation, but patient has not had a bowel movement.  If she does have a bowel movement, okay to collect the stool for testing as ordered already.  Elevation in white count may be due to dehydration from diarrhea.  Patient is otherwise afebrile.  Monitor on repeat labs  Clear liquid diet okay today Colonoscopy prep ordered  Plan discussed with Dr. Tobie Poet  Thank you for involving me in the care of this patient.      LOS: 0 days   Virgel Manifold, MD  07/02/2021, 3:02 PM

## 2021-07-02 NOTE — Progress Notes (Signed)
[facility] for IV heparin Indication: chest pain/ACS/STEMI  No Known Allergies  Patient Measurements: Height: '5\' 3"'$  (160 cm) Weight: 39 kg (86 lb) IBW/kg (Calculated) : 52.4 Heparin Dosing Weight: 39 kg  Vital Signs: Temp: 98.5 F (36.9 C) (09/13 0636) Temp Source: Oral (09/13 0636) BP: 80/69 (09/13 1140) Pulse Rate: 150 (09/13 1140)  Labs: Recent Labs    07/02/21 0644  HGB 13.0  HCT 37.1  PLT 428*  CREATININE 1.15*  TROPONINIHS 690*    Estimated Creatinine Clearance: 33.2 mL/min (A) (by C-G formula based on SCr of 1.15 mg/dL (H)).   Medical History: Past Medical History:  Diagnosis Date   Anxiety    Depression     Medications: none   Assessment: [name] with PMH of anxiety, depression, and tobacco use presenting with nausea and vomiting. While in ED, found to have elevated troponins. Per my chart review at this time, patient is not on anticoagulation at home. ECG resulted "minimal ST elevation, lateral leads".   Goal of Therapy:  Heparin level 0.3-0.7 units/ml Monitor platelets by anticoagulation protocol: Yes   Plan:  Give 2400 units bolus x 1 Start heparin infusion at 450 units/hr Check anti-Xa level in 6 hours Baseline aPTT and INR Daily CBC   Wynelle Cleveland, PharmD Pharmacy Resident  07/02/2021 12:01 PM

## 2021-07-02 NOTE — ED Notes (Signed)
Patient is alert and oriented x 4 at this time.  Patient states she is not having any chest pain except when she takes a deep breath.

## 2021-07-02 NOTE — Progress Notes (Signed)
   07/02/21 1420  Clinical Encounter Type  Visited With Patient  Visit Type Initial  Referral From Nurse  Consult/Referral To Chaplain  Spiritual Encounters  Spiritual Needs Prayer;Emotional  Chaplain Sila Sarsfield responded to an OR for prayer. OR completed.

## 2021-07-02 NOTE — H&P (Signed)
History and Physical   Carla Martinez [id number] DOB: 11-Jul-1961 DOA: 07/02/2021  PCP: [name], MD Outpatient Specialists: Dr. Marius Ditch, gastroenterology Patient coming from: Home via [place]  I have personally briefly reviewed patient's old medical records in [place].  Chief Concern: N/V/D  HPI: Carla Martinez is a 60 y.o. female with medical history significant for depression, anxiety, insomnia, history of bowel resection, anorexia, mild recurrent major depression, GERD, baseline hypotension, who presents to the emergency department for chief concerns of nausea, vomiting, diarrhea.  At bedside, patient is frail and does not appear to be in acute distress.  She is AandO x3.  Her eldest daughter (33 years) passed on 04-06-2021. She has been depressed and tearful since. She has been taking nyquil every day, throughout the day. She denies SI and HI. She denies plans of harming herself or others.   She had more than half of the bowel prep. She kept throwing up dark green liquid. She denies fever. She vomitted several times. She denies bright red blood.  She endorses taking p.m. ibuprofen, frequently.  She takes ibuprofen PM. She has shortness of breath, this is her baseline shortness of breath.  She denies chest pain, syncope, loss of consciousness, dysphagia, dysuria, hematuria.  Social history: Her mother and disabled son lives with her. She smokes less than 1/2 ppd, (5-6 cigarettes per day). Her last drinks was 2-3 days away and it was one - two glasses of wine. One month ago, she had four glasses per night. She uses delta8 (synthetic marijuana), every daily. She last used delta 8 yesterday, 9/12. She vapes daily.   Vaccination history: Unknown  ROS: Constitutional: + weight change, no fever ENT/Mouth: + sore throat, no rhinorrhea Eyes: no eye pain, no vision changes Cardiovascular: no chest pain, + dyspnea,  no edema, no palpitations Respiratory: no cough, no  sputum, no wheezing Gastrointestinal:+ nausea,+ vomiting, + diarrhea, no constipation Genitourinary: no urinary incontinence, no dysuria, no hematuria Musculoskeletal: no arthralgias, no myalgias Skin: no skin lesions, no pruritus, Neuro: + weakness, no loss of consciousness, no syncope Psych: no anxiety, no depression, + decrease appetite Heme/Lymph: no bruising, no bleeding  ED Course: Discussed with emergency medicine provider, patient requiring hospitalization for chief concerns of persistent nausea and vomiting and diarrhea.  Vitals in the emergency department was remarkable for temperature 98.5, respiration rate of 20, blood pressure 99/57, SPO2 of 100% on room air.  Serum sodium 137, potassium 3.0, chloride 84, bicarb 30, BUN 28, serum creatinine of 1.15, anion gap of 23, GFR 56. WBC 16.3, hemoglobin 13, hematocrit 37, platelets 428. High-sensitivity troponin is 690.  Acetaminophen level is less than 10, salicylates acid level is less than 7.  TSH is 0.751.  Phosphorus is 2.9, magnesium 1.8.  An EKG in the emergency department showed atrial fibrillation with RVR, rate of 154, QTc 514  Assessment/Plan  Principal Problem:   Atrial fibrillation with RVR (HCC) Active Problems:   Mild recurrent major depression (HCC)   Acid reflux   Osteoporosis   History of bowel resection   Rectal prolapse   Diarrhea   Enteritis   Anorexia   Alcohol use   Anxiety   Insomnia   Coffee ground emesis   Complicated grief   Grief at loss of child   Hypotension, chronic   # Atrial fibrillation with RVR-etiology work-up in progress, I believe this is multifactorial including persistent nausea vomiting diarrhea resulting in electrolytes via GI loss and possible upper  GI bleed in setting of ibuprofen use and alcohol use - Status post diltiazem 5 mg IV injection once, she converted to normal sinus - Please see below for possible etiology treatments - CHA2DS2-VASc score is 1, for gender; in my  clinical judgment, this atrial fibrillation is due to reversible etiology and the work-up for that is in progress at this time - No clinical indications for permanent anticoagulation  # Coffee-ground emesis-suspect upper GI bleed - Patient has been taking increased amounts of ibuprofen and over the last 2, she has had increased consumption of alcohol in setting of complicated grief below - Protonix gtt. Initiated - No indications for blood products at this time - GI has been consulted, Dr.Tahiliani is aware and will see patient - Clear liquids per GI recommendations  # Enteritis/gastroenteritis-on CT imaging - Checking GI panel, C. Difficile PCR - Ceftriaxone IV and metronidazole IV initiated, 3 days ordered - A.m. team to discontinue if indicated  # Leukocytosis-etiology work-up in progress, differentials include enteritis versus reactive in setting of possible upper GI bleed - CBC in the a.m.  # Complicated grief # Grief at loss of a child - Psychiatry was offered inpatient, she declines - She states that she is working with her PCP to get outpatient psychiatry referral - She denies suicidal ideation, homicidal ideation, and or plans of harming herself or others - Chaplain/spiritual care has been consulted  # Hypodense lesion in the liver with peripheral nodular enhancement-per CT imaging reflects hemangioma - Recommend outpatient continued follow-up - Acute hepatitis panel ordered - Updated daughter, Ms. Romero Liner over the phone extensively and advise that patient follow-up outpatient with her primary care doctor and she would need a hepatologist referral  # NSTEMI - I suspect this is secondary to GI bleed  - Heparin GTT initiated and this will be continued - A.m. team to follow-up on high sensitive  # Patient has baseline hypotension # Chronic hypotension - Status post sodium chloride 2 L bolus per EDP - Lactated ringer 150 mL/h initiated  # Hypokalemia - I suspect  this is secondary to GI loss in setting of vomiting-status post sodium chloride 40 mill equivalent once per EDP  # Anorexia presumed secondary to complicated grief above - Stat check phosphorus which was with in normal limits - Multivitamins daily ordered  # Alcohol use-continue CIWA protocol, thiamine and folic acid ordered # GERD-patient is currently on Protonix gtt  # Updated and discussed patient's care extensively with mother Ms. Romero Liner over the phone at [phone].  I discussed regarding hepatic lesions and patient's CODE STATUS. - I recommended that patient have regular follow-up regarding the hepatic lesions as she will need follow-up on ultrasound/other imagings/testing/biopsy and these need to be done outpatient - Ms. Johnnye Sima states that she understands and will help her daughter - Patient is awake alert and oriented and able to complete the physical examination fully.  She does not want to be resuscitated or intubated.  I updated with Ms. Johnnye Sima regarding her daughter's wishes and she understands.  Chart reviewed.   DVT prophylaxis: Heparin GTT Code Status: DNR Diet: Clear liquids per GI recommendations. Family Communication:  Discussed extensively with mother, Ms. Dorris Stephenson over the phone including hepatic lesions and patients code status  Disposition Plan: Pending clinical course Consults called: GI Admission status: Stepdown, observation, inpatient  Past Medical History:  Diagnosis Date   Anxiety    Depression    Past Surgical History:  Procedure Laterality Date   CHOLECYSTECTOMY  COLON RESECTION     12" removed   MENISCUS REPAIR Bilateral    Social History:  reports that she has been smoking cigarettes. She started smoking about 17 years ago. She has a 4.25 pack-year smoking history. She has never used smokeless tobacco. She reports current alcohol use. She reports that she does not use drugs.  No Known Allergies Family History   Problem Relation Age of Onset   Leukemia Sister    Diabetes type I Child    Heart disease Sister    Hypertension Mother    Heart disease Mother    Stroke Mother    Down syndrome Son    Stroke Son    Family history: Family history reviewed and pertinent for leukemia in sister.  Heart disease in sister and mother.  Prior to Admission medications   Medication Sig Start Date End Date Taking? Authorizing Provider  amitriptyline (ELAVIL) 25 MG tablet Take 1 tablet (25 mg total) by mouth at bedtime. 06/04/21   Lin Landsman, MD  Biotin 1000 MCG tablet Take 1,000 mcg by mouth daily.    [provider]  hydrOXYzine (ATARAX/VISTARIL) 50 MG tablet Take 2 tablets (100 mg total) by mouth 3 (three) times daily as needed for anxiety. 05/09/21   [name], MD  Multiple Vitamins-Minerals (WOMENS 50+ ADVANCED PO) Take 1 tablet by mouth daily.    [provider]  Potassium 99 MG TABS Take 2 tablets by mouth daily.    [provider]  QUEtiapine (SEROQUEL) 100 MG tablet Take 1 tablet (100 mg total) by mouth 3 (three) times daily. 03/22/21   [name], MD  trazodone (DESYREL) 300 MG tablet Take 1 tablet (300 mg total) by mouth at bedtime. 04/25/21   [name], MD  traZODone (DESYREL) 50 MG tablet Take 1 tablet (50 mg total) by mouth 2 (two) times daily. Patient taking differently: Take 50 mg by mouth 3 (three) times daily. 03/15/21   [name], MD   Physical Exam: Vitals:   07/02/21 1130 07/02/21 1140 07/02/21 1205 07/02/21 1230  BP: (!) 80/62 (!) 80/69 (!) 80/58 (!) 71/58  Pulse: (!) 51 (!) 150 100 (!) 106  Resp: '14 18 18 16  '$ Temp:      TempSrc:      SpO2: 94% 95% 96% 96%  Weight:      Height:       Constitutional: appears older than chronological age, frail, cachectic, anorexic, NAD, calm Eyes: PERRL, lids and conjunctivae normal ENMT: Mucous membranes are dry. Posterior pharynx clear of any exudate or lesions. Age-appropriate dentition. Hearing  appropriate Neck: normal, supple, no masses, no thyromegaly Respiratory: clear to auscultation bilaterally, no wheezing, no crackles. Normal respiratory effort. No accessory muscle use.  Cardiovascular: Regular rate and rhythm, no murmurs / rubs / gallops. No extremity edema. 2+ pedal pulses. No carotid bruits.  Abdomen: Scaphoid abdomen, mild tenderness, no masses palpated, no hepatosplenomegaly. Bowel sounds positive.  Musculoskeletal: no clubbing / cyanosis. No joint deformity upper and lower extremities. Good ROM, no contractures, no atrophy. Normal muscle tone.  Skin: no rashes, lesions, ulcers. No induration Neurologic: Sensation intact. Strength 5/5 in all 4.  Psychiatric: Normal judgment and insight. Alert and oriented x 3. Normal mood.   EKG: independently reviewed, showing multiple EKGs.  one EKG showed atrial fibrillation with RVR, rate of 150s 4, QTc 513  Another EKG showed sinus tachycardia with rate of 99, QTc 560  Imaging on Admission: I personally reviewed  and I agree with radiologist reading as below.  CT ABDOMEN PELVIS W CONTRAST  Result Date: 07/02/2021 CLINICAL DATA:  Abdominal distention, nausea, vomiting common diarrhea EXAM: CT ABDOMEN AND PELVIS WITH CONTRAST TECHNIQUE: Multidetector CT imaging of the abdomen and pelvis was performed using the standard protocol following bolus administration of intravenous contrast. CONTRAST:  51m OMNIPAQUE IOHEXOL 350 MG/ML SOLN COMPARISON:  None. FINDINGS: Lower chest: The lung bases are clear. The imaged heart is unremarkable. Hepatobiliary: The liver is enlarged there is a 1.7 cm by 1.2 cm by 1.6 cm hypodense lesion in hepatic segment VI with foci of peripheral nodular enhancement. There is moderate periportal edema, nonspecific. The gallbladder is surgically absent. Mild prominence of the extrahepatic bile ducts is likely due to the history of cholecystectomy. Pancreas: Unremarkable. Spleen: Unremarkable. Adrenals/Urinary Tract: The  adrenals are unremarkable. The kidneys are normal, with no focal lesion, stone, hydronephrosis, or hydroureter. The bladder is unremarkable. Stomach/Bowel: The stomach is unremarkable. There are multiple loops of small bowel in the left upper quadrant with a moderately thickened wall measuring up to 8 mm. There is no evidence of mechanical bowel obstruction. Vascular/Lymphatic: There is scattered calcified atherosclerotic plaque throughout the nonaneurysmal abdominal aorta. The major branch vessels are patent. There is no abdominal or pelvic lymphadenopathy. Reproductive: The uterus and adnexa are unremarkable. Other: There is no ascites or free air. Musculoskeletal: There is scalloping of the posterior endplate of the L5 vertebral body with widening of the right neural foramen raising suspicion for a nerve sheath tumor (6-50). Ovoid hypodense lesions are also seen in the sacral canal. There is age-indeterminate compression deformity of the L2 vertebral body with approximately 30% loss of vertebral body height. There is minimal bony retropulsion at this level. IMPRESSION: 1. Multiple loops of small bowel with moderately thickened wall in the left upper quadrant suggesting nonspecific infectious or inflammatory enteritis. 2. Moderate periportal edema is nonspecific and can be seen in the setting of heart failure, hepatitis, among other etiologies. Correlate with liver function tests and history, and consider correlation with hepatitis testing as indicated. 3. Indeterminate hypodense lesion in the liver with peripheral nodular enhancement most likely reflects a hemangioma; however, recommend nonemergent outpatient liver protocol MRI of the abdomen with and without contrast for characterization. 4. 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 Recommend nonemergent lumbar spine MRI with and without contrast for  further evaluation. 5. Age-indeterminate compression deformity of the L2 vertebral body. Correlate with point tenderness, and MRI may be considered for evaluation of chronicity. If this MRI is obtained during this ED/inpatient visit, postcontrast imaging is recommended for evaluation of the suspected nerve sheath tumor. Aortic Atherosclerosis (ICD10-I70.0). Electronically Signed   By: PValetta MoleM.D.   On: 07/02/2021 11:19    Labs on Admission: I have personally reviewed following labs  CBC: Recent Labs  Lab 07/02/21 0644  WBC 16.3*  NEUTROABS 14.3*  HGB 13.0  HCT 37.1  MCV 94.2  PLT [id number]   Basic Metabolic Panel: Recent Labs  Lab 07/02/21 0644 07/02/21 0906  NA 137  --   K 3.0*  --   CL 84*  --   CO2 30  --   GLUCOSE 215*  --   BUN 28*  --   CREATININE 1.15*  --   CALCIUM 10.6*  --   MG  --  1.8  PHOS 2.9  --    GFR:  Estimated Creatinine Clearance: 33.2 mL/min (A) (by C-G formula based on SCr of 1.15 mg/dL (H)).  Liver Function Tests: Recent Labs  Lab 07/02/21 0644  AST 67*  ALT 44  ALKPHOS 67  BILITOT 0.6  PROT 7.2  ALBUMIN 4.3   Recent Labs  Lab 07/02/21 0644  LIPASE 30   Thyroid Function Tests: Recent Labs    07/02/21 1103  TSH 0.751  FREET4 1.15*   Urine analysis:    Component Value Date/Time   COLORURINE YELLOW (A) 07/02/2021 1300   APPEARANCEUR CLEAR (A) 07/02/2021 1300   LABSPEC >1.046 (H) 07/02/2021 1300   PHURINE 5.0 07/02/2021 1300   GLUCOSEU 50 (A) 07/02/2021 1300   HGBUR NEGATIVE 07/02/2021 1300   BILIRUBINUR NEGATIVE 07/02/2021 1300   BILIRUBINUR negative 02/14/2021 1518   KETONESUR NEGATIVE 07/02/2021 1300   PROTEINUR 30 (A) 07/02/2021 1300   UROBILINOGEN 0.2 02/14/2021 1518   NITRITE NEGATIVE 07/02/2021 1300   LEUKOCYTESUR NEGATIVE 07/02/2021 1300   CRITICAL CARE Performed by: Briant Cedar Elliyah Liszewski  Total critical care time: 35 minutes  Critical care time was exclusive of separately billable procedures and treating other  patients.  Critical care was necessary to treat or prevent imminent or life-threatening deterioration.  Circulatory failure, GI bleed, NSTEMI  Critical care was time spent personally by me on the following activities: development of treatment plan with patient and/or surrogate as well as nursing, discussions with consultants, evaluation of patient's response to treatment, examination of patient, obtaining history from patient or surrogate, ordering and performing treatments and interventions, ordering and review of laboratory studies, ordering and review of radiographic studies, pulse oximetry and re-evaluation of patient's condition.  Dr. Tobie Poet [facility]  If 7PM-7AM, please contact overnight-coverage provider If 7AM-7PM, please contact day coverage provider [email or web address]  07/02/2021, 1:37 PM

## 2021-07-02 NOTE — ED Notes (Signed)
Pt complaining of chest tightness. Admitting aware and at bedside. EKG performed and troponin sent to lab

## 2021-07-03 ENCOUNTER — Inpatient Hospital Stay: Payer: No Typology Code available for payment source | Admitting: Anesthesiology

## 2021-07-03 ENCOUNTER — Encounter: Payer: Self-pay | Admitting: Internal Medicine

## 2021-07-03 ENCOUNTER — Encounter
Admission: EM | Disposition: A | Discharge status: Home / Self Care | Payer: Self-pay | Source: Home / Self Care | Attending: Internal Medicine

## 2021-07-03 ENCOUNTER — Inpatient Hospital Stay
Admit: 2021-07-03 | Discharge: 2021-07-03 | Disposition: A | Discharge status: Home / Self Care | Payer: No Typology Code available for payment source | Attending: Internal Medicine | Admitting: Internal Medicine

## 2021-07-03 DIAGNOSIS — D72829 Elevated white blood cell count, unspecified: Secondary | ICD-10-CM | POA: Diagnosis not present

## 2021-07-03 DIAGNOSIS — R778 Other specified abnormalities of plasma proteins: Secondary | ICD-10-CM | POA: Diagnosis not present

## 2021-07-03 DIAGNOSIS — R4182 Altered mental status, unspecified: Secondary | ICD-10-CM

## 2021-07-03 DIAGNOSIS — Z7289 Other problems related to lifestyle: Secondary | ICD-10-CM

## 2021-07-03 DIAGNOSIS — K529 Noninfective gastroenteritis and colitis, unspecified: Secondary | ICD-10-CM | POA: Diagnosis not present

## 2021-07-03 DIAGNOSIS — R197 Diarrhea, unspecified: Secondary | ICD-10-CM | POA: Diagnosis not present

## 2021-07-03 DIAGNOSIS — L8990 Pressure ulcer of unspecified site, unspecified stage: Secondary | ICD-10-CM | POA: Insufficient documentation

## 2021-07-03 DIAGNOSIS — G9341 Metabolic encephalopathy: Secondary | ICD-10-CM | POA: Diagnosis not present

## 2021-07-03 DIAGNOSIS — I4891 Unspecified atrial fibrillation: Secondary | ICD-10-CM | POA: Diagnosis not present

## 2021-07-03 DIAGNOSIS — I959 Hypotension, unspecified: Secondary | ICD-10-CM

## 2021-07-03 LAB — CBC
HCT: 29.4 % — ABNORMAL LOW (ref 36.0–46.0)
Hemoglobin: 10.3 g/dL — ABNORMAL LOW (ref 12.0–15.0)
MCH: 33.9 pg (ref 26.0–34.0)
MCHC: 35 g/dL (ref 30.0–36.0)
MCV: 96.7 fL (ref 80.0–100.0)
Platelets: 252 10*3/uL (ref 150–400)
RBC: 3.04 MIL/uL — ABNORMAL LOW (ref 3.87–5.11)
RDW: 13.4 % (ref 11.5–15.5)
WBC: 18.3 10*3/uL — ABNORMAL HIGH (ref 4.0–10.5)
nRBC: 0 % (ref 0.0–0.2)

## 2021-07-03 LAB — HEPARIN LEVEL (UNFRACTIONATED)
Heparin Unfractionated: <0.1 IU/mL — ABNORMAL LOW (ref 0.30–0.70)
Heparin Unfractionated: 0.11 IU/mL — ABNORMAL LOW (ref 0.30–0.70)
Heparin Unfractionated: 0.18 IU/mL — ABNORMAL LOW (ref 0.30–0.70)

## 2021-07-03 LAB — BASIC METABOLIC PANEL
Anion gap: 9 (ref 5–15)
BUN: 18 mg/dL (ref 6–20)
CO2: 28 mmol/L (ref 22–32)
Calcium: 8 mg/dL — ABNORMAL LOW (ref 8.9–10.3)
Chloride: 97 mmol/L — ABNORMAL LOW (ref 98–111)
Creatinine, Ser: 0.77 mg/dL (ref 0.44–1.00)
GFR, Estimated: >60 mL/min (ref >60)
Glucose, Bld: 126 mg/dL — ABNORMAL HIGH (ref 70–99)
Potassium: 4.2 mmol/L (ref 3.5–5.1)
Sodium: 134 mmol/L — ABNORMAL LOW (ref 135–145)

## 2021-07-03 LAB — ECHOCARDIOGRAM COMPLETE
AR max vel: 3.24 cm2
AV Area VTI: 3.41 cm2
AV Area mean vel: 3.14 cm2
AV Mean grad: 1 mmHg
AV Peak grad: 2.6 mmHg
Ao pk vel: 0.8 m/s
Area-P 1/2: 3.83 cm2
Calc EF: 10.1 %
Height: 63 in
S' Lateral: 4 cm
Single Plane A2C EF: 13.2 %
Single Plane A4C EF: 15.6 %
Weight: 1376 oz

## 2021-07-03 LAB — SARS CORONAVIRUS 2 (TAT 6-24 HRS): SARS Coronavirus 2: NEGATIVE

## 2021-07-03 LAB — HIV ANTIBODY (ROUTINE TESTING W REFLEX): HIV Screen 4th Generation wRfx: NONREACTIVE

## 2021-07-03 LAB — GLUCOSE, CAPILLARY
Glucose-Capillary: 135 mg/dL — ABNORMAL HIGH (ref 70–99)
Glucose-Capillary: 120 mg/dL — ABNORMAL HIGH (ref 70–99)

## 2021-07-03 SURGERY — EGD (ESOPHAGOGASTRODUODENOSCOPY)
Anesthesia: General

## 2021-07-03 MED ORDER — HEPARIN BOLUS VIA INFUSION
1100.0000 [IU] | Freq: Once | INTRAVENOUS | Status: AC
  Administered 2021-07-03: 1100 [IU] via INTRAVENOUS
  Filled 2021-07-03: qty 1100

## 2021-07-03 MED ORDER — HALOPERIDOL LACTATE 5 MG/ML IJ SOLN
2.0000 mg | Freq: Four times a day (QID) | INTRAMUSCULAR | Status: DC | PRN
  Administered 2021-07-04: 2 mg via INTRAVENOUS
  Filled 2021-07-03: qty 1

## 2021-07-03 MED ORDER — TRAZODONE HCL 100 MG PO TABS: 100.0000 mg | ORAL_TABLET | Freq: Every day | ORAL | Status: DC

## 2021-07-03 MED ORDER — TRAZODONE HCL 50 MG PO TABS
50.0000 mg | ORAL_TABLET | Freq: Every day | ORAL | Status: DC
  Administered 2021-07-04: 50 mg via ORAL
  Filled 2021-07-03: qty 1

## 2021-07-03 MED ORDER — SODIUM CHLORIDE 0.9 % IV BOLUS
500.0000 mL | Freq: Once | INTRAVENOUS | Status: AC
  Administered 2021-07-03: 500 mL via INTRAVENOUS

## 2021-07-03 MED ORDER — HALOPERIDOL LACTATE 5 MG/ML IJ SOLN
5.0000 mg | Freq: Once | INTRAMUSCULAR | Status: AC
  Administered 2021-07-03: 5 mg via INTRAVENOUS
  Filled 2021-07-03: qty 1

## 2021-07-03 MED ORDER — MIDODRINE HCL 5 MG PO TABS
10.0000 mg | ORAL_TABLET | Freq: Three times a day (TID) | ORAL | Status: DC
  Administered 2021-07-03 – 2021-07-09 (×16): 10 mg via ORAL
  Filled 2021-07-03 (×16): qty 2

## 2021-07-03 MED ORDER — SODIUM CHLORIDE 0.9 % IV SOLN
3.0000 g | Freq: Four times a day (QID) | INTRAVENOUS | Status: DC
  Administered 2021-07-03 – 2021-07-06 (×12): 3 g via INTRAVENOUS
  Filled 2021-07-03: qty 3
  Filled 2021-07-03: qty 8
  Filled 2021-07-03 (×2): qty 3
  Filled 2021-07-03: qty 8
  Filled 2021-07-03 (×7): qty 3
  Filled 2021-07-03: qty 8
  Filled 2021-07-03 (×2): qty 3

## 2021-07-03 MED ORDER — DEXMEDETOMIDINE HCL IN NACL 400 MCG/100ML IV SOLN
0.4000 ug/kg/h | INTRAVENOUS | Status: DC
  Administered 2021-07-03: 0.4 ug/kg/h via INTRAVENOUS
  Administered 2021-07-03: 1 ug/kg/h via INTRAVENOUS
  Administered 2021-07-04: 0.4 ug/kg/h via INTRAVENOUS
  Filled 2021-07-03 (×3): qty 100

## 2021-07-03 MED ORDER — QUETIAPINE FUMARATE 25 MG PO TABS
50.0000 mg | ORAL_TABLET | Freq: Three times a day (TID) | ORAL | Status: DC
  Administered 2021-07-04: 50 mg via ORAL
  Filled 2021-07-03: qty 2

## 2021-07-03 NOTE — Progress Notes (Signed)
[employer] Parodoxical reaction reported after use of ativan with increased agitation to point of fighting and biting at mits, IV's. Haldol use attempted x 2 without any relief of agitation.  Precedex drip initiated and patient rested well

## 2021-07-03 NOTE — Progress Notes (Signed)
[place] for IV heparin Indication: chest pain/ACS/STEMI  Patient Measurements: Heparin Dosing Weight: 39 kg  Labs: Recent Labs    07/02/21 0644 07/02/21 1253 07/02/21 1304 07/02/21 1730 07/02/21 1846 07/03/21 0518  HGB 13.0  --   --   --   --  10.3*  HCT 37.1  --   --   --   --  29.4*  PLT 428*  --   --   --   --  252  APTT  --   --  >200*  --   --   --   LABPROT  --   --  14.2  --   --   --   INR  --   --  1.1  --   --   --   HEPARINUNFRC  --   --   --   --  <0.10* <0.10*  CREATININE 1.15*  --   --   --   --  0.77  TROPONINIHS 690* 1,135*  --  1,119*  --   --      Estimated Creatinine Clearance: 47.8 mL/min (by C-G formula based on SCr of 0.77 mg/dL).   Medical History: Past Medical History:  Diagnosis Date   Anxiety    Depression     Medications No anticoagulation prior to admission per my chart review  Assessment: [name] with PMH of anxiety, depression, and tobacco use presenting with nausea and vomiting. While in ED, found to have elevated troponins. No anticoagulation PTA. ECG resulted "minimal ST elevation, lateral leads".   0913 1846 HL < 0.10; subthera, 450 un/hr 0914  0518 HL < 0.10; subthera, 550 un/hr  Goal of Therapy:  Heparin level 0.3-0.7 units/ml Monitor platelets by anticoagulation protocol: Yes   Plan:  --Heparin level is subtherapeutic --Heparin 1100 unit IV bolus --Increase heparin infusion rate to 700 units/hr --Re-check HL 6 hours after rate change  Benita Gutter 07/03/2021 8:43 AM

## 2021-07-03 NOTE — Progress Notes (Signed)
1 mg of haldol given at 2340 and pt remained very agitated, at 0021 '5mg'$  of haldol was given with no change. Pt still getting out of bed, pulling at lines and biting mitts. Precedex was ordered per NP floor coverage, and started at 0.71mg, had to increase to 0.6, with some improvement in pt behavior, will continue to monitor.

## 2021-07-03 NOTE — TOC Initial Note (Addendum)
Transition of Care [facility]) - Initial/Assessment Note    Patient Details  Name: Melizza Amonett MRN: [id number] Date of Birth: Jun 04, 1961  [facility]) CM/SW Contact:    Kerin Salen, RN Phone Number: 07/03/2021, 1:03 PM  Clinical Narrative:  Unable to do TOC assessment patient NMS, attempted to call mother, no answer LVM to return call.     1:40pm Mother returned call, says she lives with patient and her special needs son, who is non-verbal. Mother says patient is independent with ADL's, drives to medical visits Dr. Waunita Schooner, PCP, [facility]. [employer] is drug store used. Never used [facility]. Able to do shopping and cooking, mother denies any need for services at this time. TOC to continue to track for discharge needs.          Patient Goals and CMS Choice        Expected Discharge Plan and Services                                                Prior Living Arrangements/Services                       Activities of Daily Living      Permission Sought/Granted                  Emotional Assessment              Admission diagnosis:  Shortness of breath [R06.02] Dehydration [E86.0] Enteritis [K52.9] Atrial fibrillation with rapid ventricular response (HCC) [I48.91] Atrial fibrillation with RVR (HCC) [I48.91] Nausea vomiting and diarrhea [R11.2, R19.7] Patient Active Problem List   Diagnosis Date Noted   Pressure injury of skin 07/03/2021   Enteritis 07/02/2021   Anorexia 07/02/2021   Alcohol use 07/02/2021   Anxiety 07/02/2021   Insomnia 07/02/2021   Coffee ground emesis 07/02/2021   Atrial fibrillation with RVR ([place]) [id number]   Complicated grief XX123456   Grief at loss of child 07/02/2021   Hypotension, 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   Rectal prolapse 05/09/2021   Diarrhea 05/09/2021   Lobar pneumonia ([place]) 04/12/2021   Osteoporosis 02/27/2021   History of bowel resection 02/27/2021    Acid reflux 02/14/2021   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   Acute left-sided low back pain 02/14/2021   Left lower quadrant abdominal pain 02/14/2021   Mild recurrent major depression ([place]) 04/20/2020   PCP:  [name], MD Pharmacy:   [facility] [address] [place] - [place] [address] [place] [address] Phone: [phone] Fax: [place], [place]. [place] RO[address] [place] [address] Phone: [phone] Fax: [phone]     Social Determinants of Health (SDOH) Interventions    Readmission Risk Interventions No flowsheet data found.

## 2021-07-03 NOTE — Progress Notes (Signed)
[place] for IV heparin Indication: chest pain/ACS/STEMI  Patient Measurements: Heparin Dosing Weight: 39 kg  Labs: Recent Labs    07/02/21 0644 07/02/21 1253 07/02/21 1304 07/02/21 1730 07/02/21 1846 07/03/21 0518 07/03/21 1450 07/03/21 2144  HGB 13.0  --   --   --   --  10.3*  --   --   HCT 37.1  --   --   --   --  29.4*  --   --   PLT 428*  --   --   --   --  252  --   --   APTT  --   --  >200*  --   --   --   --   --   LABPROT  --   --  14.2  --   --   --   --   --   INR  --   --  1.1  --   --   --   --   --   HEPARINUNFRC  --   --   --   --    < > <0.10* 0.11* 0.18*  CREATININE 1.15*  --   --   --   --  0.77  --   --   TROPONINIHS 690* 1,135*  --  1,119*  --   --   --   --    < > = values in this interval not displayed.     Estimated Creatinine Clearance: 47.8 mL/min (by C-G formula based on SCr of 0.77 mg/dL).   Medical History: Past Medical History:  Diagnosis Date   Anxiety    Depression     Medications No anticoagulation prior to admission per my chart review  Assessment: [name] with PMH of anxiety, depression, and tobacco use presenting with nausea and vomiting. While in ED, found to have elevated troponins. No anticoagulation PTA. ECG resulted "minimal ST elevation, lateral leads".   0913 1846 HL < 0.10; subthera, 450 un/hr 0914  0518 HL < 0.10; subthera, 550 un/hr 0914 1450 HL     0.11; subthera, 700 un/hr > 850 0914  2144 HL     0.18; subthera 850 > 1000 units/hr   Goal of Therapy:  Heparin level 0.3-0.7 units/ml Monitor platelets by anticoagulation protocol: Yes   Plan:  --Heparin level is subtherapeutic --Heparin 1100 unit IV bolus --Increase heparin infusion rate to 1000 units/hr --Re-check HL 6 hours after rate change  Dorothe Pea, PharmD, BCPS Clinical Pharmacist   07/03/2021 10:26 PM

## 2021-07-03 NOTE — Plan of Care (Signed)
  Problem: Clinical Measurements: Goal: Cardiovascular complication will be avoided Outcome: Progressing   Problem: Nutrition: Goal: Adequate nutrition will be maintained Outcome: Progressing   Problem: Elimination: Goal: Will not experience complications related to bowel motility Outcome: Progressing   Problem: Pain Managment: Goal: General experience of comfort will improve Outcome: Progressing   Problem: Safety: Goal: Ability to remain free from injury will improve Outcome: Progressing   Problem: Skin Integrity: Goal: Risk for impaired skin integrity will decrease Outcome: Progressing   

## 2021-07-03 NOTE — Progress Notes (Signed)
[facility] PROGRESS NOTE   Carla Martinez [id number] DOB: Oct 29, 1960 DOA: 07/02/2021  PCP: [name], MD  Brief History/Interval Summary: 60 y.o. female with medical history significant for depression, anxiety, insomnia, history of bowel resection, anorexia, mild recurrent major depression, GERD, baseline hypotension, who presented to the emergency department for chief concerns of nausea, vomiting, diarrhea.  Patient has been very depressed and tearful since May 2022 when her eldest daughter passed away.  Apparently has been drinking alcohol and doing other recreational drugs including taking NyQuil.  Was supposed to undergo EGD and colonoscopy and was going through her bowel prep when she developed nausea vomiting.  This prompted admission to the hospital.  This was followed by alteration in mental status changes and agitation requiring transfer to the ICU and was placed on Precedex.    Consultants: None at this time  Procedures: None  Antibiotics: Anti-infectives (From admission, onward)    Start     Dose/Rate Route Frequency Ordered Stop   07/02/21 1400  cefTRIAXone (ROCEPHIN) 1 g in sodium chloride 0.9 % 100 mL IVPB  Status:  Discontinued        1 g 200 mL/hr over 30 Minutes Intravenous Every 24 hours 07/02/21 1342 07/02/21 1352   07/02/21 1400  metroNIDAZOLE (FLAGYL) IVPB 500 mg        500 mg 100 mL/hr over 60 Minutes Intravenous Every 12 hours 07/02/21 1342 07/05/21 1359   07/02/21 1400  cefTRIAXone (ROCEPHIN) 2 g in sodium chloride 0.9 % 100 mL IVPB        2 g 200 mL/hr over 30 Minutes Intravenous  Once 07/02/21 1351 07/02/21 1447       Subjective/Interval History: Patient noted to be quite confused.  She was fast asleep and sedated.  Upon waking her up she did open her eyes and look at me but would not answer any question.  She tried to remove her mittens.  History is unavailable at this time from the patient.    Assessment/Plan:  Acute  metabolic encephalopathy/drug withdrawal/alcohol withdrawal Concern is for withdrawal from alcohol and other illicit recreational drugs that she had been doing recently.  Requiring Precedex infusion.  She is not moving all of her extremities equally so do not suspect any neurological event at this time.  Continue Precedex.  Blood pressure however noted to be low.  Discussed with nursing staff who will try to wean her down. Urine drug screen positive for amphetamines, cannabinoid, phencyclidine as well as tricyclic.  Hypotension Most likely due to medications.  She is on Precedex infusion.  However she also presented with nausea vomiting and diarrhea so there could be an element of hypovolemia as well.  We will give her IV fluids.  Hold her metoprolol for now.  Hold other sedative agents. Per H&P patient does have chronic hypotension.  Atrial fibrillation with RVR Converted to sinus rhythm after she was given diltiazem.  Seen by cardiology.  Placed on heparin infusion.  TSH 0.75.  Free T4 1.15.  Echocardiogram is pending.  Nausea vomiting and diarrhea/concern for GI bleed/gastroenteritis There was some concern for coffee-ground emesis.  Drop in hemoglobin is likely dilutional.  Patient has been taking increased amount of ibuprofen along with the use of alcohol recently.  Currently on Protonix infusion.  GI is following.  No overt bleeding has been noted. Continue IV fluids.  Leukocytosis/concern for aspiration pneumonia Could be reactive.  Noted to be afebrile.  Chest x-ray done overnight does show right-sided opacity.  She could have aspirated.  Patient is noted to be on metronidazole currently.  Apparently also received ceftriaxone.  We will change her to Unasyn.  Hypodense lesion in the liver with peripheral nodular enhancement CT scan suggest that this could be hemangioma.  Will need outpatient follow-up.  Hepatitis panel unremarkable  Possible NSTEMI Could be due to A. fib with RVR and  acute illness.  Placed on heparin infusion.  Neurology is following.  Hypokalemia Corrected.  History of alcohol abuse On CIWA protocol.  On Precedex infusion.  Stage I sacral decubitus Pressure Injury 07/02/21 Sacrum Medial Stage 1 -  Intact skin with non-blanchable redness of a localized area usually over a bony prominence. skin intact, pink, non-blanchable redness (Active)  07/02/21 2030  Location: Sacrum  Location Orientation: Medial  Staging: Stage 1 -  Intact skin with non-blanchable redness of a localized area usually over a bony prominence.  Wound Description (Comments): skin intact, pink, non-blanchable redness  Present on Admission: Yes     DVT Prophylaxis: On heparin infusion Code Status: DNR Family Communication: No family at bedside Disposition Plan: Hopefully return home when improved  Status is: Inpatient  Remains inpatient appropriate because:Altered mental status, Ongoing diagnostic testing needed not appropriate for outpatient work up, and IV treatments appropriate due to intensity of illness or inability to take PO  Dispo: The patient is from: Home              Anticipated d/c is to: Home              Patient currently is not medically stable to d/c.   Difficult to place patient No       Medications: Scheduled:  amitriptyline  25 mg Oral QHS   Chlorhexidine Gluconate Cloth  6 each Topical [id number]   folic acid  1 mg Oral Daily   magnesium oxide  400 mg Oral Daily   metoprolol tartrate  25 mg Oral BID   multivitamin with minerals  1 tablet Oral Daily   [START ON 07/06/2021] pantoprazole  40 mg Intravenous Q12H   polyethylene glycol-electrolytes  4,000 mL Oral Once   QUEtiapine  100 mg Oral TID   thiamine  100 mg Oral Daily   Or   thiamine  100 mg Intravenous Daily   trazodone  300 mg Oral QHS   traZODone  50 mg Oral BID   vitamin B-12  1,000 mcg Oral Daily   Continuous:  dexmedetomidine (PRECEDEX) IV infusion 0.5 mcg/kg/hr (07/03/21 0425)    heparin 700 Units/hr (07/03/21 0857)   lactated ringers 150 mL/hr at 07/03/21 0527   metronidazole 500 mg (07/03/21 0115)   pantoprazole 8 mg/hr (07/03/21 0425)   promethazine (PHENERGAN) injection (IM or IVPB)     [id number] **OR** acetaminophen, hydrOXYzine, ipratropium-albuterol, ondansetron **OR** ondansetron (ZOFRAN) IV, promethazine (PHENERGAN) injection (IM or IVPB)   Objective:  Vital Signs  Vitals:   07/03/21 0130 07/03/21 0200 07/03/21 0300 07/03/21 0400  BP:  (!) 88/59 115/79 94/63  Pulse:  92 81 78  Resp:  (!) 21 (!) 27 (!) 28  Temp: 98.3 F (36.8 C)     TempSrc: Oral     SpO2:  96% 97% 94%  Weight:      Height:        Intake/Output Summary (Last 24 hours) at 07/03/2021 0939 Last data filed at 07/03/2021 0425 Gross per 24 hour  Intake 2333.93 ml  Output --  Net 2333.93 ml   Filed Weights   07/02/21 [id number]  Weight: 39 kg    General appearance: Remains agitated and confused.  In no distress Resp: Diminished air entry at the bases.  Crackles in the right base. Cardio: S1-S2 is normal regular.  No S3-S4.  No rubs murmurs or bruit GI: Abdomen is soft.  Nontender nondistended.  Bowel sounds are present normal.  No masses organomegaly Extremities: No edema.  Noted to be moving all of her extremities Neurologic: Encephalopathic.  Agitated.  No obvious focal deficits noted.   Lab Results:  Data Reviewed: I have personally reviewed following labs and imaging studies  CBC: Recent Labs  Lab 07/02/21 0644 07/03/21 0518  WBC 16.3* 18.3*  NEUTROABS 14.3*  --   HGB 13.0 10.3*  HCT 37.1 29.4*  MCV 94.2 96.7  PLT 428* [id number]    Basic Metabolic Panel: Recent Labs  Lab 07/02/21 0644 07/02/21 0906 07/03/21 0518  NA 137  --  134*  K 3.0*  --  4.2  CL 84*  --  97*  CO2 30  --  28  GLUCOSE 215*  --  126*  BUN 28*  --  18  CREATININE 1.15*  --  0.77  CALCIUM 10.6*  --  8.0*  MG  --  1.8  --   PHOS 2.9  --   --     GFR: Estimated Creatinine  Clearance: 47.8 mL/min (by C-G formula based on SCr of 0.77 mg/dL).  Liver Function Tests: Recent Labs  Lab 07/02/21 0644  AST 67*  ALT 44  ALKPHOS 67  BILITOT 0.6  PROT 7.2  ALBUMIN 4.3    Recent Labs  Lab 07/02/21 0644  LIPASE 30    Coagulation Profile: Recent Labs  Lab 07/02/21 1304  INR 1.1     Thyroid Function Tests: Recent Labs    07/02/21 1103  TSH 0.751  FREET4 1.15*     Recent Results (from the past 240 hour(s))  SARS CORONAVIRUS 2 (TAT 6-24 HRS) Nasopharyngeal Nasopharyngeal Swab     Status: None   Collection Time: 07/02/21  1:50 PM   Specimen: Nasopharyngeal Swab  Result Value Ref Range Status   SARS Coronavirus 2 NEGATIVE NEGATIVE Final    Comment: (NOTE) SARS-CoV-2 target nucleic acids are NOT DETECTED.  The SARS-CoV-2 RNA is generally detectable in upper and lower respiratory specimens during the acute phase of infection. Negative results do not preclude SARS-CoV-2 infection, do not rule out co-infections with other pathogens, and should not be used as the sole basis for treatment or other patient management decisions. Negative results must be combined with clinical observations, patient history, and epidemiological information. The expected result is Negative.  Fact Sheet for Patients: SugarRoll.be  Fact Sheet for Healthcare Providers: https://www.woods-mathews.com/  This test is not yet approved or cleared by the [place] FDA and  has been authorized for detection and/or diagnosis of SARS-CoV-2 by FDA under an Emergency Use Authorization (EUA). This EUA will remain  in effect (meaning this test can be used) for the duration of the COVID-19 declaration under Se ction 564(b)(1) of the Act, 21 U.S.C. section 360bbb-3(b)(1), unless the authorization is terminated or revoked sooner.  Performed at [facility], [place] [address], [place], [place] [address]   MRSA Next Gen by PCR, Nasal      Status: None   Collection Time: 07/02/21  8:26 PM   Specimen: Nasal Mucosa; Nasal Swab  Result Value Ref Range Status   MRSA by PCR Next Gen NOT DETECTED NOT DETECTED Final  Comment: (NOTE) The GeneXpert MRSA Assay (FDA approved for NASAL specimens only), is one component of a comprehensive MRSA colonization surveillance program. It is not intended to diagnose MRSA infection nor to guide or monitor treatment for MRSA infections. Test performance is not FDA approved in patients less than 22 years old. Performed at [facility], [address] [place], [place] [address]       Radiology Studies: CT ABDOMEN PELVIS W CONTRAST  Result Date: 07/02/2021 CLINICAL DATA:  Abdominal distention, nausea, vomiting common diarrhea EXAM: CT ABDOMEN AND PELVIS WITH CONTRAST TECHNIQUE: Multidetector CT imaging of the abdomen and pelvis was performed using the standard protocol following bolus administration of intravenous contrast. CONTRAST:  101m OMNIPAQUE IOHEXOL 350 MG/ML SOLN COMPARISON:  None. FINDINGS: Lower chest: The lung bases are clear. The imaged heart is unremarkable. Hepatobiliary: The liver is enlarged there is a 1.7 cm by 1.2 cm by 1.6 cm hypodense lesion in hepatic segment VI with foci of peripheral nodular enhancement. There is moderate periportal edema, nonspecific. The gallbladder is surgically absent. Mild prominence of the extrahepatic bile ducts is likely due to the history of cholecystectomy. Pancreas: Unremarkable. Spleen: Unremarkable. Adrenals/Urinary Tract: The adrenals are unremarkable. The kidneys are normal, with no focal lesion, stone, hydronephrosis, or hydroureter. The bladder is unremarkable. Stomach/Bowel: The stomach is unremarkable. There are multiple loops of small bowel in the left upper quadrant with a moderately thickened wall measuring up to 8 mm. There is no evidence of mechanical bowel obstruction. Vascular/Lymphatic: There is scattered calcified  atherosclerotic plaque throughout the nonaneurysmal abdominal aorta. The major branch vessels are patent. There is no abdominal or pelvic lymphadenopathy. Reproductive: The uterus and adnexa are unremarkable. Other: There is no ascites or free air. Musculoskeletal: There is scalloping of the posterior endplate of the L5 vertebral body with widening of the right neural foramen raising suspicion for a nerve sheath tumor (6-50). Ovoid hypodense lesions are also seen in the sacral canal. There is age-indeterminate compression deformity of the L2 vertebral body with approximately 30% loss of vertebral body height. There is minimal bony retropulsion at this level. IMPRESSION: 1. Multiple loops of small bowel with moderately thickened wall in the left upper quadrant suggesting nonspecific infectious or inflammatory enteritis. 2. Moderate periportal edema is nonspecific and can be seen in the setting of heart failure, hepatitis, among other etiologies. Correlate with liver function tests and history, and consider correlation with hepatitis testing as indicated. 3. Indeterminate hypodense lesion in the liver with peripheral nodular enhancement most likely reflects a hemangioma; however, recommend nonemergent outpatient liver protocol MRI of the abdomen with and without contrast for characterization. 4. 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 Recommend nonemergent lumbar spine MRI with and without contrast for further evaluation. 5. Age-indeterminate compression deformity of the L2 vertebral body. Correlate with point tenderness, and MRI may be considered for evaluation of chronicity. If this MRI is obtained during this ED/inpatient visit, postcontrast imaging is recommended for evaluation of the suspected nerve sheath tumor. Aortic Atherosclerosis (ICD10-I70.0). Electronically Signed   By: PValetta MoleM.D.   On:  07/02/2021 11:19   DG Chest Port 1 View  Result Date: 07/02/2021 CLINICAL DATA:  Nausea, vomiting, diarrhea EXAM: PORTABLE CHEST 1 VIEW COMPARISON:  None. FINDINGS: Single frontal view of the chest demonstrates an unremarkable cardiac silhouette. Since the CT scan performed earlier today, there is been development of central vascular  congestion and patchy right infrahilar airspace disease suspicious for edema. No effusion or pneumothorax. No acute bony abnormalities. IMPRESSION: 1. Worsening volume status, with central vascular congestion and likely developing basilar edema. Electronically Signed   By: Randa Ngo M.D.   On: 07/02/2021 18:03       LOS: 1 day   [facility] Pager on [email or web address]  07/03/2021, 9:39 AM

## 2021-07-03 NOTE — Progress Notes (Signed)
[place] for IV heparin Indication: chest pain/ACS/STEMI  Patient Measurements: Heparin Dosing Weight: 39 kg  Labs: Recent Labs    07/02/21 0644 07/02/21 1253 07/02/21 1304 07/02/21 1730 07/02/21 1846 07/03/21 0518 07/03/21 1450  HGB 13.0  --   --   --   --  10.3*  --   HCT 37.1  --   --   --   --  29.4*  --   PLT 428*  --   --   --   --  252  --   APTT  --   --  >200*  --   --   --   --   LABPROT  --   --  14.2  --   --   --   --   INR  --   --  1.1  --   --   --   --   HEPARINUNFRC  --   --   --   --  <0.10* <0.10* 0.11*  CREATININE 1.15*  --   --   --   --  0.77  --   TROPONINIHS 690* 1,135*  --  1,119*  --   --   --      Estimated Creatinine Clearance: 47.8 mL/min (by C-G formula based on SCr of 0.77 mg/dL).   Medical History: Past Medical History:  Diagnosis Date   Anxiety    Depression     Medications No anticoagulation prior to admission per my chart review  Assessment: [name] with PMH of anxiety, depression, and tobacco use presenting with nausea and vomiting. While in ED, found to have elevated troponins. No anticoagulation PTA. ECG resulted "minimal ST elevation, lateral leads".   0913 1846 HL < 0.10; subthera, 450 un/hr 0914  0518 HL < 0.10; subthera, 550 un/hr 0914 1450 HL     0.11; subthera, 700 un/hr  Goal of Therapy:  Heparin level 0.3-0.7 units/ml Monitor platelets by anticoagulation protocol: Yes   Plan:  --Heparin level is subtherapeutic --Heparin 1100 unit IV bolus --Increase heparin infusion rate to 850 units/hr --Re-check HL 6 hours after rate change  Benita Gutter 07/03/2021 3:22 PM

## 2021-07-03 NOTE — Anesthesia Preprocedure Evaluation (Deleted)
Anesthesia Evaluation  Patient identified by MRN, date of birth, ID band Patient confused    Reviewed: Allergy & Precautions, NPO status , Patient's Chart, lab work & pertinent test results  History of Anesthesia Complications (+) history of anesthetic complications  Airway Mallampati: III  TM Distance: >3 FB Neck ROM: full    Dental  (+) Missing, Chipped, Poor Dentition   Pulmonary pneumonia, Current Smoker,     + decreased breath sounds      Cardiovascular + dysrhythmias Atrial Fibrillation      Neuro/Psych PSYCHIATRIC DISORDERS negative neurological ROS     GI/Hepatic Neg liver ROS, GERD  Medicated and Controlled,  Endo/Other  negative endocrine ROS  Renal/GU negative Renal ROS  negative genitourinary   Musculoskeletal   Abdominal   Peds  Hematology negative hematology ROS (+)   Anesthesia Other Findings Past Medical History: No date: Anxiety No date: Depression  Past Surgical History: No date: CHOLECYSTECTOMY No date: COLON RESECTION     Comment:  12" removed No date: MENISCUS REPAIR; Bilateral  BMI    Body Mass Index: 15.23 kg/m      Reproductive/Obstetrics negative OB ROS                             Anesthesia Physical Anesthesia Plan  ASA: 3  Anesthesia Plan: General   Post-op Pain Management:    Induction: Intravenous  PONV Risk Score and Plan: Propofol infusion and TIVA  Airway Management Planned: Natural Airway and Simple Face Mask  Additional Equipment:   Intra-op Plan:   Post-operative Plan:   Informed Consent: I have reviewed the patients History and Physical, chart, labs and discussed the procedure including the risks, benefits and alternatives for the proposed anesthesia with the patient or authorized representative who has indicated his/her understanding and acceptance.   Patient has DNR.  Discussed DNR with power of attorney and Suspend DNR.    Dental Advisory Given  Plan Discussed with: Anesthesiologist, CRNA and Surgeon  Anesthesia Plan Comments: (History and phone consent from the patients mother Lazaro Arms at [phone]   Mother was consented for risks of anesthesia including but not limited to:  - adverse reactions to medications - risk of airway placement if required - damage to eyes, teeth, lips or other oral mucosa - nerve damage due to positioning  - sore throat or hoarseness - Damage to heart, brain, nerves, lungs, other parts of body or loss of life  She voiced understanding.)       Anesthesia Quick Evaluation

## 2021-07-03 NOTE — Progress Notes (Signed)
Carla Antigua, MD [address], [place], [place], [place], [address] [address], [place], [place], [place], [address] Phone: [phone]  Fax: [phone]   Subjective: Since my evaluation yesterday, patient has had acute changes in clinical status.  She is now in the ICU, due to alteration in mental status, agitation, requiring Precedex.  On my evaluation today, she is somnolent as she is on Precedex.  Therefore, unable to answer questions   Objective: Exam: Vital signs in last 24 hours: Vitals:   07/03/21 0130 07/03/21 0200 07/03/21 0300 07/03/21 0400  BP:  (!) 88/59 115/79 94/63  Pulse:  92 81 78  Resp:  (!) 21 (!) 27 (!) 28  Temp: 98.3 F (36.8 C)     TempSrc: Oral     SpO2:  96% 97% 94%  Weight:      Height:       Weight change:   Intake/Output Summary (Last 24 hours) at 07/03/2021 1353 Last data filed at 07/03/2021 0425 Gross per 24 hour  Intake 2333.93 ml  Output --  Net 2333.93 ml    Constitutional: General:   Somnolent BP 94/63   Pulse 78   Temp 98.3 F (36.8 C) (Oral)   Resp (!) 28   Ht '5\' 3"'$  (1.6 m)   Wt 39 kg   SpO2 94%   BMI 15.23 kg/m   Eyes:  Sclera clear, no icterus.   Conjunctiva pink.   Ears:  No scars, lesions or masses, Normal auditory acuity. Nose:  No deformity, discharge, or lesions. Mouth:  No deformity or lesions, oropharynx pink & moist.  Neck:  Supple; no masses, trachea midline  Respiratory: Normal respiratory effort  Gastrointestinal:  Soft, non-tender and non-distended without masses, hepatosplenomegaly or hernias noted.  No guarding or rebound tenderness.     Cardiac: No clubbing or edema.  No cyanosis. Normal posterior tibial pedal pulses noted.  Lymphatic:  No significant cervical adenopathy.  Psych:  Alert and cooperative. Normal mood and affect.  Musculoskeletal:  Head normocephalic, atraumatic. 5/5 Lower extremity strength bilaterally.  Skin: Warm. Intact without significant lesions or rashes. No  jaundice.  Neurologic:  Face symmetrical, tongue midline, Normal sensation to touch  Psych: Somnolent   Lab Results: Lab Results  Component Value Date   WBC 18.3 (H) 07/03/2021   HGB 10.3 (L) 07/03/2021   HCT 29.4 (L) 07/03/2021   MCV 96.7 07/03/2021   PLT 252 07/03/2021   Micro Results: Recent Results (from the past 240 hour(s))  SARS CORONAVIRUS 2 (TAT 6-24 HRS) Nasopharyngeal Nasopharyngeal Swab     Status: None   Collection Time: 07/02/21  1:50 PM   Specimen: Nasopharyngeal Swab  Result Value Ref Range Status   SARS Coronavirus 2 NEGATIVE NEGATIVE Final    Comment: (NOTE) SARS-CoV-2 target nucleic acids are NOT DETECTED.  The SARS-CoV-2 RNA is generally detectable in upper and lower respiratory specimens during the acute phase of infection. Negative results do not preclude SARS-CoV-2 infection, do not rule out co-infections with other pathogens, and should not be used as the sole basis for treatment or other patient management decisions. Negative results must be combined with clinical observations, patient history, and epidemiological information. The expected result is Negative.  Fact Sheet for Patients: SugarRoll.be  Fact Sheet for Healthcare Providers: https://www.woods-mathews.com/  This test is not yet approved or cleared by the [place] FDA and  has been authorized for detection and/or diagnosis of SARS-CoV-2 by FDA under an Emergency Use Authorization (EUA). This EUA  will remain  in effect (meaning this test can be used) for the duration of the COVID-19 declaration under Se ction 564(b)(1) of the Act, 21 U.S.C. section 360bbb-3(b)(1), unless the authorization is terminated or revoked sooner.  Performed at [facility], [place] [address], [place], [place] [address]   MRSA Next Gen by PCR, Nasal     Status: None   Collection Time: 07/02/21  8:26 PM   Specimen: Nasal Mucosa; Nasal Swab  Result Value Ref  Range Status   MRSA by PCR Next Gen NOT DETECTED NOT DETECTED Final    Comment: (NOTE) The GeneXpert MRSA Assay (FDA approved for NASAL specimens only), is one component of a comprehensive MRSA colonization surveillance program. It is not intended to diagnose MRSA infection nor to guide or monitor treatment for MRSA infections. Test performance is not FDA approved in patients less than 85 years old. Performed at [facility], [address] [place] [address], [place] [address]    Studies/Results: CT ABDOMEN PELVIS W CONTRAST  Result Date: 07/02/2021 CLINICAL DATA:  Abdominal distention, nausea, vomiting common diarrhea EXAM: CT ABDOMEN AND PELVIS WITH CONTRAST TECHNIQUE: Multidetector CT imaging of the abdomen and pelvis was performed using the standard protocol following bolus administration of intravenous contrast. CONTRAST:  67m OMNIPAQUE IOHEXOL 350 MG/ML SOLN COMPARISON:  None. FINDINGS: Lower chest: The lung bases are clear. The imaged heart is unremarkable. Hepatobiliary: The liver is enlarged there is a 1.7 cm by 1.2 cm by 1.6 cm hypodense lesion in hepatic segment VI with foci of peripheral nodular enhancement. There is moderate periportal edema, nonspecific. The gallbladder is surgically absent. Mild prominence of the extrahepatic bile ducts is likely due to the history of cholecystectomy. Pancreas: Unremarkable. Spleen: Unremarkable. Adrenals/Urinary Tract: The adrenals are unremarkable. The kidneys are normal, with no focal lesion, stone, hydronephrosis, or hydroureter. The bladder is unremarkable. Stomach/Bowel: The stomach is unremarkable. There are multiple loops of small bowel in the left upper quadrant with a moderately thickened wall measuring up to 8 mm. There is no evidence of mechanical bowel obstruction. Vascular/Lymphatic: There is scattered calcified atherosclerotic plaque throughout the nonaneurysmal abdominal aorta. The major branch vessels are patent. There is no abdominal  or pelvic lymphadenopathy. Reproductive: The uterus and adnexa are unremarkable. Other: There is no ascites or free air. Musculoskeletal: There is scalloping of the posterior endplate of the L5 vertebral body with widening of the right neural foramen raising suspicion for a nerve sheath tumor (6-50). Ovoid hypodense lesions are also seen in the sacral canal. There is age-indeterminate compression deformity of the L2 vertebral body with approximately 30% loss of vertebral body height. There is minimal bony retropulsion at this level. IMPRESSION: 1. Multiple loops of small bowel with moderately thickened wall in the left upper quadrant suggesting nonspecific infectious or inflammatory enteritis. 2. Moderate periportal edema is nonspecific and can be seen in the setting of heart failure, hepatitis, among other etiologies. Correlate with liver function tests and history, and consider correlation with hepatitis testing as indicated. 3. Indeterminate hypodense lesion in the liver with peripheral nodular enhancement most likely reflects a hemangioma; however, recommend nonemergent outpatient liver protocol MRI of the abdomen with and without contrast for characterization. 4. 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 Recommend nonemergent lumbar spine MRI with and without contrast for further evaluation. 5. Age-indeterminate compression deformity of the L2 vertebral body. Correlate with point tenderness, and  MRI may be considered for evaluation of chronicity. If this MRI is obtained during this ED/inpatient visit, postcontrast imaging is recommended for evaluation of the suspected nerve sheath tumor. Aortic Atherosclerosis (ICD10-I70.0). Electronically Signed   By: Valetta Mole M.D.   On: 07/02/2021 11:19   DG Chest Port 1 View  Result Date: 07/02/2021 CLINICAL DATA:  Nausea, vomiting, diarrhea EXAM: PORTABLE  CHEST 1 VIEW COMPARISON:  None. FINDINGS: Single frontal view of the chest demonstrates an unremarkable cardiac silhouette. Since the CT scan performed earlier today, there is been development of central vascular congestion and patchy right infrahilar airspace disease suspicious for edema. No effusion or pneumothorax. No acute bony abnormalities. IMPRESSION: 1. Worsening volume status, with central vascular congestion and likely developing basilar edema. Electronically Signed   By: Randa Ngo M.D.   On: 07/02/2021 18:03   ECHOCARDIOGRAM COMPLETE  Result Date: 07/03/2021    ECHOCARDIOGRAM REPORT   Patient Name:   Carla Martinez Date of Exam: 07/03/2021 Medical Rec #:  [id number]            Height:       63.0 in Accession #:    [id number]           Weight:       86.0 lb Date of Birth:  01/12/1961            BSA:          1.351 m Patient Age:    32 years             BP:           94/63 mmHg Patient Gender: F                    HR:           78 bpm. Exam Location:  [facility] Procedure: 2D Echo, Cardiac Doppler and Color Doppler Indications:     Dyspnea R06.00                  Elevated troponin  History:         Patient has no prior history of Echocardiogram examinations.                  Arrythmias:Atrial Fibrillation. Anxiety, depression.  Sonographer:     Sherrie Sport Referring Phys:  [id number] AMY N COX Diagnosing Phys: Serafina Royals MD  Sonographer Comments: Suboptimal apical window. IMPRESSIONS  1. Left ventricular ejection fraction, by estimation, is 25 to 30%. The left ventricle has severely decreased function. The left ventricle demonstrates regional wall motion abnormalities (see scoring diagram/findings for description). The left ventricular internal cavity size was mildly to moderately dilated. Left ventricular diastolic parameters were normal.  2. Right ventricular systolic function is normal. The right ventricular size is normal.  3. Left atrial size was mildly dilated.  4. The mitral valve is normal in  structure. Mild mitral valve regurgitation.  5. The aortic valve is normal in structure. Aortic valve regurgitation is trivial. FINDINGS  Left Ventricle: Left ventricular ejection fraction, by estimation, is 25 to 30%. The left ventricle has severely decreased function. The left ventricle demonstrates regional wall motion abnormalities. The left ventricular internal cavity size was mildly  to moderately dilated. There is no left ventricular hypertrophy. Left ventricular diastolic parameters were normal.  LV Wall Scoring: The entire apex, mid anterolateral segment, and mid inferoseptal segment are akinetic. The mid anteroseptal segment, mid inferolateral segment, mid anterior segment, and mid inferior  segment are hypokinetic. The basal anteroseptal segment, basal inferolateral segment, basal anterolateral segment, basal anterior segment, basal inferior segment, and basal inferoseptal segment are normal. Right Ventricle: The right ventricular size is normal. No increase in right ventricular wall thickness. Right ventricular systolic function is normal. Left Atrium: Left atrial size was mildly dilated. Right Atrium: Right atrial size was normal in size. Pericardium: There is no evidence of pericardial effusion. Mitral Valve: The mitral valve is normal in structure. Mild mitral valve regurgitation. Tricuspid Valve: The tricuspid valve is normal in structure. Tricuspid valve regurgitation is trivial. Aortic Valve: The aortic valve is normal in structure. Aortic valve regurgitation is trivial. Aortic valve mean gradient measures 1.0 mmHg. Aortic valve peak gradient measures 2.6 mmHg. Aortic valve area, by VTI measures 3.41 cm. Pulmonic Valve: The pulmonic valve was normal in structure. Pulmonic valve regurgitation is not visualized. Aorta: The aortic root and ascending aorta are structurally normal, with no evidence of dilitation. IAS/Shunts: No atrial level shunt detected by color flow Doppler.  LEFT VENTRICLE PLAX 2D  LVIDd:         4.10 cm     Diastology LVIDs:         4.00 cm     LV e' medial:    5.66 cm/s LV PW:         0.90 cm     LV E/e' medial:  11.5 LV IVS:        0.70 cm     LV e' lateral:   4.57 cm/s LVOT diam:     2.10 cm     LV E/e' lateral: 14.2 LV SV:         46 LV SV Index:   34 LVOT Area:     3.46 cm  LV Volumes (MOD) LV vol d, MOD A2C: 87.1 ml LV vol d, MOD A4C: 92.2 ml LV vol s, MOD A2C: 75.6 ml LV vol s, MOD A4C: 77.8 ml LV SV MOD A2C:     11.5 ml LV SV MOD A4C:     92.2 ml LV SV MOD BP:      9.1 ml RIGHT VENTRICLE RV Basal diam:  2.90 cm RV S prime:     7.72 cm/s TAPSE (M-mode): 3.0 cm LEFT ATRIUM             Index       RIGHT ATRIUM          Index LA diam:        3.50 cm 2.59 cm/m  RA Area:     8.31 cm LA Vol (A2C):   41.2 ml 30.50 ml/m RA Volume:   15.50 ml 11.47 ml/m LA Vol (A4C):   15.4 ml 11.40 ml/m LA Biplane Vol: 26.8 ml 19.84 ml/m  AORTIC VALVE                   PULMONIC VALVE AV Area (Vmax):    3.24 cm    PV Vmax:        0.41 m/s AV Area (Vmean):   3.14 cm    PV Peak grad:   0.7 mmHg AV Area (VTI):     3.41 cm    RVOT Peak grad: 1 mmHg AV Vmax:           80.30 cm/s AV Vmean:          55.200 cm/s AV VTI:            0.136 m AV Peak Grad:  2.6 mmHg AV Mean Grad:      1.0 mmHg LVOT Vmax:         75.10 cm/s LVOT Vmean:        50.000 cm/s LVOT VTI:          0.134 m LVOT/AV VTI ratio: 0.99  AORTA Ao Root diam: 3.30 cm MITRAL VALVE               TRICUSPID VALVE MV Area (PHT): 3.83 cm    TR Peak grad:   34.8 mmHg MV Decel Time: 198 msec    TR Vmax:        295.00 cm/s MV E velocity: 65.10 cm/s MV A velocity: 42.40 cm/s  SHUNTS MV E/A ratio:  1.54        Systemic VTI:  0.13 m                            Systemic Diam: 2.10 cm Serafina Royals MD Electronically signed by Serafina Royals MD Signature Date/Time: 07/03/2021/1:16:59 PM    Final    Medications:  Scheduled Meds:  Chlorhexidine Gluconate Cloth  6 each Topical 99991111   folic acid  1 mg Oral Daily   magnesium oxide  400 mg Oral Daily    multivitamin with minerals  1 tablet Oral Daily   [START ON 07/06/2021] pantoprazole  40 mg Intravenous Q12H   polyethylene glycol-electrolytes  4,000 mL Oral Once   QUEtiapine  100 mg Oral TID   thiamine  100 mg Oral Daily   Or   thiamine  100 mg Intravenous Daily   trazodone  100 mg Oral QHS   vitamin B-12  1,000 mcg Oral Daily   Continuous Infusions:  ampicillin-sulbactam (UNASYN) IV     dexmedetomidine (PRECEDEX) IV infusion 0.8 mcg/kg/hr (07/03/21 1347)   heparin 700 Units/hr (07/03/21 0857)   pantoprazole 8 mg/hr (07/03/21 1101)   promethazine (PHENERGAN) injection (IM or IVPB)     PRN Meds:.acetaminophen **OR** acetaminophen, ipratropium-albuterol, ondansetron **OR** ondansetron (ZOFRAN) IV, promethazine (PHENERGAN) injection (IM or IVPB)   Assessment: Principal Problem:   Atrial fibrillation with RVR (HCC) Active Problems:   Mild recurrent major depression (HCC)   Acid reflux   Osteoporosis   History of bowel resection   Rectal prolapse   Diarrhea   Enteritis   Anorexia   Alcohol use   Anxiety   Insomnia   Coffee ground emesis   Complicated grief   Grief at loss of child   Hypotension, chronic   Pressure injury of skin    Plan: Patient with acute changes in mental status since my evaluation yesterday, requiring ICU transfer and Precedex for agitation  Therefore, patient's EGD and colonoscopy was canceled today and she did not drink colonoscopy prep yesterday.    Cardiology has evaluated the patient.  Their note was reviewed and echo showed severe LV systolic dysfunction, EF of 25 to 30%, Takotsubo and/or stress-induced cardiomyopathy is on the differential.  Patient's troponin is acutely elevated and patient is being treated as probable NSTEMI as well  She has an elevated white count.  Hemoglobin has declined from yesterday, but there is no evidence of active bleeding.  Patient is receiving Unasyn.  Continue to monitor white count  Chest X-ray shows  worsening volume status and central vascular congestion  Given above acute medical issues, and with no evidence of acute GI bleeding, would recommend holding off on EGD and colonoscopy at this time as  procedure would be high risk in this setting  Once she is optimized from a cardiology standpoint can consider EGD and colonoscopy as an inpatient or outpatient  Infectious stool panel was ordered by primary team on admission and this has not been collected.  However, outpatient work-up recently showed a negative infectious panel.  Since admission, patient has had 1 documented bowel movement yesterday night, documented as green type VI small bowel movement.   LOS: 1 day   Carla Antigua, MD 07/03/2021, 1:53 PM

## 2021-07-03 NOTE — Progress Notes (Signed)
[facility] Encounter Note  Patient: Carla Martinez / Admit Date: 07/02/2021 / Date of Encounter: 07/03/2021, 1:44 PM   Subjective: No particular change in condition overnight.  No evidence of true angina and or cardiovascular symptoms on admission and/or currently.  Patient was admitted for mainly nausea and vomiting.  Now she is still obtunded and unable to converse at all.  EKG has shown continued diffuse anterior precordial ST and T wave changes.  Troponin level peaked at 1135.  Echocardiogram showing severe segmental LV systolic dysfunction with mid to apical akinesis and ejection fraction of 25 to 30%.  No evidence of significant valvular heart disease.  Pattern of wall motion abnormalities suggest the possibility of Takotsubo and/or stress-induced cardiomyopathy  Review of Systems:  : Cannot assess   Objective: Telemetry: Normal sinus rhythm Physical Exam: Blood pressure 94/63, pulse 78, temperature 98.3 F (36.8 C), temperature source Oral, resp. rate (!) 28, height '5\' 3"'$  (1.6 m), weight 39 kg, SpO2 94 %. Body mass index is 15.23 kg/m. General: Well developed, well nourished, in no acute distress. Head: Normocephalic, atraumatic, sclera non-icteric, no xanthomas, nares are without discharge. Neck: No apparent masses Lungs: Normal respirations with no wheezes, no rhonchi, no rales , no crackles   Heart: Regular rate and rhythm, normal S1 S2, no murmur, no rub, no gallop, PMI is normal size and placement, carotid upstroke normal without bruit, jugular venous pressure normal Abdomen: Soft, non-tender, non-distended with normoactive bowel sounds. No hepatosplenomegaly. Abdominal aorta is normal size without bruit Extremities: No edema, no clubbing, no cyanosis, no ulcers,  Peripheral: 2+ radial, 2+ femoral, 2+ dorsal pedal pulses Neuro: Not alert and oriented. Moves all extremities spontaneously. Psych: Does not responds to questions appropriately with a  normal affect.   Intake/Output Summary (Last 24 hours) at 07/03/2021 1344 Last data filed at 07/03/2021 0425 Gross per 24 hour  Intake 2333.93 ml  Output --  Net 2333.93 ml    Inpatient Medications:  . Chlorhexidine Gluconate Cloth  6 each Topical Q0600  . folic acid  1 mg Oral Daily  . magnesium oxide  400 mg Oral Daily  . multivitamin with minerals  1 tablet Oral Daily  . [START ON 07/06/2021] pantoprazole  40 mg Intravenous Q12H  . polyethylene glycol-electrolytes  4,000 mL Oral Once  . QUEtiapine  100 mg Oral TID  . thiamine  100 mg Oral Daily   Or  . thiamine  100 mg Intravenous Daily  . trazodone  100 mg Oral QHS  . vitamin B-12  1,000 mcg Oral Daily   Infusions:  . ampicillin-sulbactam (UNASYN) IV    . dexmedetomidine (PRECEDEX) IV infusion 1 mcg/kg/hr (07/03/21 1101)  . heparin 700 Units/hr (07/03/21 0857)  . pantoprazole 8 mg/hr (07/03/21 1101)  . promethazine (PHENERGAN) injection (IM or IVPB)      Labs: Recent Labs    07/02/21 0644 07/02/21 0906 07/03/21 0518  NA 137  --  134*  K 3.0*  --  4.2  CL 84*  --  97*  CO2 30  --  28  GLUCOSE 215*  --  126*  BUN 28*  --  18  CREATININE 1.15*  --  0.77  CALCIUM 10.6*  --  8.0*  MG  --  1.8  --   PHOS 2.9  --   --    Recent Labs    07/02/21 0644  AST 67*  ALT 44  ALKPHOS 67  BILITOT 0.6  PROT 7.2  ALBUMIN  4.3   Recent Labs    07/02/21 0644 07/03/21 0518  WBC 16.3* 18.3*  NEUTROABS 14.3*  --   HGB 13.0 10.3*  HCT 37.1 29.4*  MCV 94.2 96.7  PLT 428* 252   No results for input(s): CKTOTAL, CKMB, TROPONINI in the last 72 hours. Invalid input(s): POCBNP No results for input(s): HGBA1C in the last 72 hours.   Weights: Filed Weights   07/02/21 [id number]  Weight: 39 kg     Radiology/Studies:  CT ABDOMEN PELVIS W CONTRAST  Result Date: 07/02/2021 CLINICAL DATA:  Abdominal distention, nausea, vomiting common diarrhea EXAM: CT ABDOMEN AND PELVIS WITH CONTRAST TECHNIQUE: Multidetector CT imaging of  the abdomen and pelvis was performed using the standard protocol following bolus administration of intravenous contrast. CONTRAST:  50m OMNIPAQUE IOHEXOL 350 MG/ML SOLN COMPARISON:  None. FINDINGS: Lower chest: The lung bases are clear. The imaged heart is unremarkable. Hepatobiliary: The liver is enlarged there is a 1.7 cm by 1.2 cm by 1.6 cm hypodense lesion in hepatic segment VI with foci of peripheral nodular enhancement. There is moderate periportal edema, nonspecific. The gallbladder is surgically absent. Mild prominence of the extrahepatic bile ducts is likely due to the history of cholecystectomy. Pancreas: Unremarkable. Spleen: Unremarkable. Adrenals/Urinary Tract: The adrenals are unremarkable. The kidneys are normal, with no focal lesion, stone, hydronephrosis, or hydroureter. The bladder is unremarkable. Stomach/Bowel: The stomach is unremarkable. There are multiple loops of small bowel in the left upper quadrant with a moderately thickened wall measuring up to 8 mm. There is no evidence of mechanical bowel obstruction. Vascular/Lymphatic: There is scattered calcified atherosclerotic plaque throughout the nonaneurysmal abdominal aorta. The major branch vessels are patent. There is no abdominal or pelvic lymphadenopathy. Reproductive: The uterus and adnexa are unremarkable. Other: There is no ascites or free air. Musculoskeletal: There is scalloping of the posterior endplate of the L5 vertebral body with widening of the right neural foramen raising suspicion for a nerve sheath tumor (6-50). Ovoid hypodense lesions are also seen in the sacral canal. There is age-indeterminate compression deformity of the L2 vertebral body with approximately 30% loss of vertebral body height. There is minimal bony retropulsion at this level. IMPRESSION: 1. Multiple loops of small bowel with moderately thickened wall in the left upper quadrant suggesting nonspecific infectious or inflammatory enteritis. 2. Moderate  periportal edema is nonspecific and can be seen in the setting of heart failure, hepatitis, among other etiologies. Correlate with liver function tests and history, and consider correlation with hepatitis testing as indicated. 3. Indeterminate hypodense lesion in the liver with peripheral nodular enhancement most likely reflects a hemangioma; however, recommend nonemergent outpatient liver protocol MRI of the abdomen with and without contrast for characterization. 4. 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 Recommend nonemergent lumbar spine MRI with and without contrast for further evaluation. 5. Age-indeterminate compression deformity of the L2 vertebral body. Correlate with point tenderness, and MRI may be considered for evaluation of chronicity. If this MRI is obtained during this ED/inpatient visit, postcontrast imaging is recommended for evaluation of the suspected nerve sheath tumor. Aortic Atherosclerosis (ICD10-I70.0). Electronically Signed   By: PValetta MoleM.D.   On: 07/02/2021 11:19   DG Chest Port 1 View  Result Date: 07/02/2021 CLINICAL DATA:  Nausea, vomiting, diarrhea EXAM: PORTABLE CHEST 1 VIEW COMPARISON:  None. FINDINGS: Single frontal view of the chest demonstrates an unremarkable cardiac silhouette. Since the  CT scan performed earlier today, there is been development of central vascular congestion and patchy right infrahilar airspace disease suspicious for edema. No effusion or pneumothorax. No acute bony abnormalities. IMPRESSION: 1. Worsening volume status, with central vascular congestion and likely developing basilar edema. Electronically Signed   By: Randa Ngo M.D.   On: 07/02/2021 18:03   ECHOCARDIOGRAM COMPLETE  Result Date: 07/03/2021    ECHOCARDIOGRAM REPORT   Patient Name:   Carla Martinez Date of Exam: 07/03/2021 Medical Rec #:  [id number]            Height:        63.0 in Accession #:    [id number]           Weight:       86.0 lb Date of Birth:  [birth year]            BSA:          1.351 m Patient Age:    60 years             BP:           94/63 mmHg Patient Gender: F                    HR:           78 bpm. Exam Location:  [facility] Procedure: 2D Echo, Cardiac Doppler and Color Doppler Indications:     Dyspnea R06.00                  Elevated troponin  History:         Patient has no prior history of Echocardiogram examinations.                  Arrythmias:Atrial Fibrillation. Anxiety, depression.  Sonographer:     Sherrie Sport Referring Phys:  [id number] AMY N COX Diagnosing Phys: Serafina Royals MD  Sonographer Comments: Suboptimal apical window. IMPRESSIONS  1. Left ventricular ejection fraction, by estimation, is 25 to 30%. The left ventricle has severely decreased function. The left ventricle demonstrates regional wall motion abnormalities (see scoring diagram/findings for description). The left ventricular internal cavity size was mildly to moderately dilated. Left ventricular diastolic parameters were normal.  2. Right ventricular systolic function is normal. The right ventricular size is normal.  3. Left atrial size was mildly dilated.  4. The mitral valve is normal in structure. Mild mitral valve regurgitation.  5. The aortic valve is normal in structure. Aortic valve regurgitation is trivial. FINDINGS  Left Ventricle: Left ventricular ejection fraction, by estimation, is 25 to 30%. The left ventricle has severely decreased function. The left ventricle demonstrates regional wall motion abnormalities. The left ventricular internal cavity size was mildly  to moderately dilated. There is no left ventricular hypertrophy. Left ventricular diastolic parameters were normal.  LV Wall Scoring: The entire apex, mid anterolateral segment, and mid inferoseptal segment are akinetic. The mid anteroseptal segment, mid inferolateral segment, mid anterior segment, and mid inferior segment  are hypokinetic. The basal anteroseptal segment, basal inferolateral segment, basal anterolateral segment, basal anterior segment, basal inferior segment, and basal inferoseptal segment are normal. Right Ventricle: The right ventricular size is normal. No increase in right ventricular wall thickness. Right ventricular systolic function is normal. Left Atrium: Left atrial size was mildly dilated. Right Atrium: Right atrial size was normal in size. Pericardium: There is no evidence of pericardial effusion. Mitral Valve: The mitral valve is normal in structure. Mild mitral valve regurgitation. Tricuspid  Valve: The tricuspid valve is normal in structure. Tricuspid valve regurgitation is trivial. Aortic Valve: The aortic valve is normal in structure. Aortic valve regurgitation is trivial. Aortic valve mean gradient measures 1.0 mmHg. Aortic valve peak gradient measures 2.6 mmHg. Aortic valve area, by VTI measures 3.41 cm. Pulmonic Valve: The pulmonic valve was normal in structure. Pulmonic valve regurgitation is not visualized. Aorta: The aortic root and ascending aorta are structurally normal, with no evidence of dilitation. IAS/Shunts: No atrial level shunt detected by color flow Doppler.  LEFT VENTRICLE PLAX 2D LVIDd:         4.10 cm     Diastology LVIDs:         4.00 cm     LV e' medial:    5.66 cm/s LV PW:         0.90 cm     LV E/e' medial:  11.5 LV IVS:        0.70 cm     LV e' lateral:   4.57 cm/s LVOT diam:     2.10 cm     LV E/e' lateral: 14.2 LV SV:         46 LV SV Index:   34 LVOT Area:     3.46 cm  LV Volumes (MOD) LV vol d, MOD A2C: 87.1 ml LV vol d, MOD A4C: 92.2 ml LV vol s, MOD A2C: 75.6 ml LV vol s, MOD A4C: 77.8 ml LV SV MOD A2C:     11.5 ml LV SV MOD A4C:     92.2 ml LV SV MOD BP:      9.1 ml RIGHT VENTRICLE RV Basal diam:  2.90 cm RV S prime:     7.72 cm/s TAPSE (M-mode): 3.0 cm LEFT ATRIUM             Index       RIGHT ATRIUM          Index LA diam:        3.50 cm 2.59 cm/m  RA Area:     8.31  cm LA Vol (A2C):   41.2 ml 30.50 ml/m RA Volume:   15.50 ml 11.47 ml/m LA Vol (A4C):   15.4 ml 11.40 ml/m LA Biplane Vol: 26.8 ml 19.84 ml/m  AORTIC VALVE                   PULMONIC VALVE AV Area (Vmax):    3.24 cm    PV Vmax:        0.41 m/s AV Area (Vmean):   3.14 cm    PV Peak grad:   0.7 mmHg AV Area (VTI):     3.41 cm    RVOT Peak grad: 1 mmHg AV Vmax:           80.30 cm/s AV Vmean:          55.200 cm/s AV VTI:            0.136 m AV Peak Grad:      2.6 mmHg AV Mean Grad:      1.0 mmHg LVOT Vmax:         75.10 cm/s LVOT Vmean:        50.000 cm/s LVOT VTI:          0.134 m LVOT/AV VTI ratio: 0.99  AORTA Ao Root diam: 3.30 cm MITRAL VALVE               TRICUSPID VALVE MV Area (PHT): 3.83  cm    TR Peak grad:   34.8 mmHg MV Decel Time: 198 msec    TR Vmax:        295.00 cm/s MV E velocity: 65.10 cm/s MV A velocity: 42.40 cm/s  SHUNTS MV E/A ratio:  1.54        Systemic VTI:  0.13 m                            Systemic Diam: 2.10 cm Serafina Royals MD Electronically signed by Serafina Royals MD Signature Date/Time: 07/03/2021/1:16:59 PM    Final      Assessment and Recommendation  60 y.o. 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  Continue medication management for probable non-ST elevation myocardial infarction and/or Takotsubo cardiomyopathy including aspirin 81 mg and heparin for 48 hours 2.  Slow introduction of medication management for above if patient can tolerate from the blood pressure and heart rate standpoint.  Currently unable to introduce beta-blocker and ACE inhibitor. 3.  Further supportive care for obtundation and/or neurologic abnormality 4.  No further cardiac diagnostics necessary at this time  Signed, Serafina Royals M.D. FACC

## 2021-07-03 NOTE — Progress Notes (Signed)
Pt was very agitated during initial assessment in the ICU, she began requesting to use the bedside commode multiple times without actually voiding, escalating to getting out of bed without assistance and complaining of chest pain and SOB. Oxygen saturation remained above 90% on 6L nasal cannula, and EKG at  2107 showed sinus tachycardia. she began pulling on her IV lines and was very disoriented, thinking she was in EMS and not being able to confirm reason for admission, poor safety awareness, only alert to self. Ativan was given in ER at [place] to admission in ICU for. Cardiology was at bedside.  Scheduled trazodond, seroquel, amitriptyline was given

## 2021-07-03 NOTE — Progress Notes (Signed)
*  PRELIMINARY RESULTS* Echocardiogram 2D Echocardiogram has been performed.  Carla Martinez 07/03/2021, 10:31 AM

## 2021-07-03 NOTE — Consult Note (Signed)
Pharmacy Antibiotic Note  Carla Martinez is a 60 y.o. female with medical history including depression / anxiety, insomnia, hx bowel resection, anorexia, GERD, chronic hypotension admitted on 07/02/2021 with pneumonia.  Pharmacy has been consulted for Unasyn dosing.  Plan:  Unasyn 3 g IV q6h  Height: '5\' 3"'$  (160 cm) Weight:  (unable at this time) IBW/kg (Calculated) : 52.4  Temp (24hrs), Avg:98.2 F (36.8 C), Min:98 F (36.7 C), Max:98.3 F (36.8 C)  Recent Labs  Lab 07/02/21 0644 07/03/21 0518  WBC 16.3* 18.3*  CREATININE 1.15* 0.77    Estimated Creatinine Clearance: 47.8 mL/min (by C-G formula based on SCr of 0.77 mg/dL).    No Known Allergies  Antimicrobials this admission: Ceftriaxone 9/13 x 1 Metronidazole 9/13 >> 9/14 Unasyn 9/14 >>   Dose adjustments this admission: N/A  Microbiology results: 9/13 MRSA PCR: negative  Thank you for allowing pharmacy to be a part of this patient's care.  Benita Gutter 07/03/2021 10:46 AM

## 2021-07-04 DIAGNOSIS — R140 Abdominal distension (gaseous): Secondary | ICD-10-CM

## 2021-07-04 DIAGNOSIS — R195 Other fecal abnormalities: Secondary | ICD-10-CM

## 2021-07-04 DIAGNOSIS — I959 Hypotension, unspecified: Secondary | ICD-10-CM | POA: Diagnosis not present

## 2021-07-04 DIAGNOSIS — I5022 Chronic systolic (congestive) heart failure: Secondary | ICD-10-CM

## 2021-07-04 DIAGNOSIS — G9341 Metabolic encephalopathy: Secondary | ICD-10-CM | POA: Diagnosis not present

## 2021-07-04 DIAGNOSIS — Z7289 Other problems related to lifestyle: Secondary | ICD-10-CM | POA: Diagnosis not present

## 2021-07-04 DIAGNOSIS — I4891 Unspecified atrial fibrillation: Secondary | ICD-10-CM | POA: Diagnosis not present

## 2021-07-04 LAB — C DIFFICILE QUICK SCREEN W PCR REFLEX
C Diff antigen: NEGATIVE
C Diff interpretation: NOT DETECTED
C Diff toxin: NEGATIVE

## 2021-07-04 LAB — GASTROINTESTINAL PANEL BY PCR, STOOL (REPLACES STOOL CULTURE)

## 2021-07-04 LAB — CBC
HCT: 29.8 % — ABNORMAL LOW (ref 36.0–46.0)
Hemoglobin: 10.5 g/dL — ABNORMAL LOW (ref 12.0–15.0)
MCH: 34.7 pg — ABNORMAL HIGH (ref 26.0–34.0)
MCHC: 35.2 g/dL (ref 30.0–36.0)
MCV: 98.3 fL (ref 80.0–100.0)
Platelets: 243 10*3/uL (ref 150–400)
RBC: 3.03 MIL/uL — ABNORMAL LOW (ref 3.87–5.11)
RDW: 13.4 % (ref 11.5–15.5)
WBC: 13.5 10*3/uL — ABNORMAL HIGH (ref 4.0–10.5)
nRBC: 0 % (ref 0.0–0.2)

## 2021-07-04 LAB — COMPREHENSIVE METABOLIC PANEL
ALT: 37 U/L (ref 0–44)
AST: 37 U/L (ref 15–41)
Albumin: 2.6 g/dL — ABNORMAL LOW (ref 3.5–5.0)
Alkaline Phosphatase: 46 U/L (ref 38–126)
Anion gap: 9 (ref 5–15)
BUN: 16 mg/dL (ref 6–20)
CO2: 27 mmol/L (ref 22–32)
Calcium: 7.6 mg/dL — ABNORMAL LOW (ref 8.9–10.3)
Chloride: 100 mmol/L (ref 98–111)
Creatinine, Ser: 0.69 mg/dL (ref 0.44–1.00)
GFR, Estimated: >60 mL/min (ref >60)
Glucose, Bld: 84 mg/dL (ref 70–99)
Potassium: 3.6 mmol/L (ref 3.5–5.1)
Sodium: 136 mmol/L (ref 135–145)
Total Bilirubin: 0.7 mg/dL (ref 0.3–1.2)
Total Protein: 5 g/dL — ABNORMAL LOW (ref 6.5–8.1)

## 2021-07-04 LAB — PROCALCITONIN: Procalcitonin: 0.27 ng/mL

## 2021-07-04 LAB — MAGNESIUM: Magnesium: 1.5 mg/dL — ABNORMAL LOW (ref 1.7–2.4)

## 2021-07-04 LAB — HEPARIN LEVEL (UNFRACTIONATED)
Heparin Unfractionated: 0.37 IU/mL (ref 0.30–0.70)
Heparin Unfractionated: 0.2 IU/mL — ABNORMAL LOW (ref 0.30–0.70)
Heparin Unfractionated: 0.28 IU/mL — ABNORMAL LOW (ref 0.30–0.70)

## 2021-07-04 LAB — LACTIC ACID, PLASMA: Lactic Acid, Venous: 2 mmol/L (ref 0.5–1.9)

## 2021-07-04 MED ORDER — HYDROXYZINE HCL 25 MG PO TABS
50.0000 mg | ORAL_TABLET | Freq: Three times a day (TID) | ORAL | Status: DC | PRN
  Administered 2021-07-04 – 2021-07-07 (×8): 50 mg via ORAL
  Filled 2021-07-04 (×4): qty 1
  Filled 2021-07-04: qty 2
  Filled 2021-07-04 (×2): qty 1
  Filled 2021-07-04 (×2): qty 2

## 2021-07-04 MED ORDER — QUETIAPINE FUMARATE 25 MG PO TABS
100.0000 mg | ORAL_TABLET | Freq: Three times a day (TID) | ORAL | Status: DC
  Administered 2021-07-04 – 2021-07-09 (×15): 100 mg via ORAL
  Filled 2021-07-04 (×2): qty 4
  Filled 2021-07-04: qty 1
  Filled 2021-07-04 (×5): qty 4
  Filled 2021-07-04: qty 1
  Filled 2021-07-04: qty 4
  Filled 2021-07-04: qty 1
  Filled 2021-07-04: qty 4
  Filled 2021-07-04 (×3): qty 1

## 2021-07-04 MED ORDER — NICOTINE 14 MG/24HR TD PT24
14.0000 mg | MEDICATED_PATCH | Freq: Every day | TRANSDERMAL | Status: DC
  Administered 2021-07-04 – 2021-07-09 (×6): 14 mg via TRANSDERMAL
  Filled 2021-07-04 (×6): qty 1

## 2021-07-04 MED ORDER — MORPHINE SULFATE (PF) 2 MG/ML IV SOLN
1.0000 mg | INTRAVENOUS | Status: DC | PRN
  Administered 2021-07-04 – 2021-07-05 (×3): 1 mg via INTRAVENOUS
  Filled 2021-07-04 (×3): qty 1

## 2021-07-04 MED ORDER — POTASSIUM CHLORIDE CRYS ER 20 MEQ PO TBCR
40.0000 meq | EXTENDED_RELEASE_TABLET | Freq: Once | ORAL | Status: AC
  Administered 2021-07-04: 40 meq via ORAL
  Filled 2021-07-04: qty 2

## 2021-07-04 MED ORDER — OXYCODONE HCL 5 MG PO TABS
5.0000 mg | ORAL_TABLET | Freq: Four times a day (QID) | ORAL | Status: DC | PRN
  Administered 2021-07-04 – 2021-07-09 (×14): 5 mg via ORAL
  Filled 2021-07-04 (×14): qty 1

## 2021-07-04 MED ORDER — HEPARIN BOLUS VIA INFUSION
600.0000 [IU] | Freq: Once | INTRAVENOUS | Status: AC
  Administered 2021-07-04: 600 [IU] via INTRAVENOUS
  Filled 2021-07-04: qty 600

## 2021-07-04 MED ORDER — AMITRIPTYLINE HCL 25 MG PO TABS
25.0000 mg | ORAL_TABLET | Freq: Every day | ORAL | Status: DC
  Administered 2021-07-04 – 2021-07-08 (×5): 25 mg via ORAL
  Filled 2021-07-04 (×6): qty 1

## 2021-07-04 MED ORDER — HEPARIN BOLUS VIA INFUSION
500.0000 [IU] | Freq: Once | INTRAVENOUS | Status: AC
  Administered 2021-07-04: 500 [IU] via INTRAVENOUS
  Filled 2021-07-04: qty 500

## 2021-07-04 MED ORDER — MAGNESIUM SULFATE 4 GM/100ML IV SOLN
4.0000 g | Freq: Once | INTRAVENOUS | Status: AC
  Administered 2021-07-04: 4 g via INTRAVENOUS
  Filled 2021-07-04: qty 100

## 2021-07-04 MED ORDER — ORAL CARE MOUTH RINSE
15.0000 mL | Freq: Two times a day (BID) | OROMUCOSAL | Status: DC
  Administered 2021-07-05 – 2021-07-09 (×6): 15 mL via OROMUCOSAL

## 2021-07-04 MED ORDER — LOPERAMIDE HCL 2 MG PO CAPS
2.0000 mg | ORAL_CAPSULE | Freq: Four times a day (QID) | ORAL | Status: DC | PRN
  Administered 2021-07-05: 2 mg via ORAL
  Filled 2021-07-04: qty 1

## 2021-07-04 MED ORDER — ENOXAPARIN SODIUM 30 MG/0.3ML IJ SOSY: 30.0000 mg | PREFILLED_SYRINGE | INTRAMUSCULAR | Status: DC

## 2021-07-04 MED ORDER — PANTOPRAZOLE SODIUM 40 MG PO TBEC
40.0000 mg | DELAYED_RELEASE_TABLET | Freq: Two times a day (BID) | ORAL | Status: DC
  Administered 2021-07-04 – 2021-07-09 (×11): 40 mg via ORAL
  Filled 2021-07-04 (×11): qty 1

## 2021-07-04 NOTE — Progress Notes (Signed)
Precedex was weaned down and turned off at 0200, pt has become more oriented. She is calm and resting in bed, able to communicate well, oriented to self, place and time. She still has some anxiety and a few episodes of SOB, but oxygen saturation remains >98%

## 2021-07-04 NOTE — Progress Notes (Addendum)
ANTICOAGULATION CONSULT NOTE  Pharmacy Consult for IV heparin Indication: chest pain/ACS/STEMI  Patient Measurements: Heparin Dosing Weight: 39 kg  Labs: Recent Labs    07/02/21 0644 07/02/21 1253 07/02/21 1304 07/02/21 1730 07/02/21 1846 07/03/21 0518 07/03/21 1450 07/03/21 2144 07/04/21 0524 07/04/21 1238  HGB 13.0  --   --   --   --  10.3*  --   --  10.5*  --   HCT 37.1  --   --   --   --  29.4*  --   --  29.8*  --   PLT 428*  --   --   --   --  252  --   --  243  --   APTT  --   --  >200*  --   --   --   --   --   --   --   LABPROT  --   --  14.2  --   --   --   --   --   --   --   INR  --   --  1.1  --   --   --   --   --   --   --   HEPARINUNFRC  --   --   --   --    < > <0.10*   < > 0.18* 0.37 0.20*  CREATININE 1.15*  --   --   --   --  0.77  --   --  0.69  --   TROPONINIHS 690* 1,135*  --  1,119*  --   --   --   --   --   --    < > = values in this interval not displayed.     Estimated Creatinine Clearance: 47.8 mL/min (by C-G formula based on SCr of 0.69 mg/dL).   Medical History: Past Medical History:  Diagnosis Date   Anxiety    Depression     Medications No anticoagulation prior to admission per my chart review  Assessment: [name] with PMH of anxiety, depression, and tobacco use presenting with nausea and vomiting. While in ED, found to have elevated troponins. No anticoagulation PTA. ECG resulted "minimal ST elevation, lateral leads".   0913 1846 HL < 0.10; subthera, 450 un/hr 0914  0518 HL < 0.10; subthera, 550 un/hr 0914 1450 HL     0.11; subthera, 700 un/hr 0914  2144 HL    0.18; subthera, 850 un/hr 0915  0524 HL    0.37; thera, 1000 un/hr 0915  1238 HL    0.20, subthera; 1000 un/hr  Goal of Therapy:  Heparin level 0.3-0.7 units/ml Monitor platelets by anticoagulation protocol: Yes   Plan:  --Heparin level is subtherapeutic --Heparin 500 unit IV bolus and increase heparin infusion rate to 1050 units/hr --Re-check HL in 6 hours --CBC  daily per protocol   Carla Martinez 07/04/2021 2:03 PM

## 2021-07-04 NOTE — Progress Notes (Signed)
Carla Antigua, MD [address] [place], [place], [place], [address] [address], [place], [place], [place], [address] Phone: [phone]  Fax: [phone]   Subjective: Patient alert today.  Family in room.  Nurse at bedside.  Patient had 2 loose bowel movements today.  No blood in stool.  No nausea or vomiting.  Has had some abdominal bloating  Patient reports chest tightness and shortness of breath and cardiology is following.  Patient is on heparin drip.  Objective: Exam: Vital signs in last 24 hours: Vitals:   07/04/21 0900 07/04/21 1000 07/04/21 1100 07/04/21 1200  BP: (!) 81/52 (!) 68/54 (!) 96/59 95/60  Pulse: 71 89 87 95  Resp: (!) 23 (!) 24 (!) 24 (!) 23  Temp:      TempSrc:      SpO2: 93% 94% 91% 96%  Weight:      Height:       Weight change:   Intake/Output Summary (Last 24 hours) at 07/04/2021 1304 Last data filed at 07/04/2021 1153 Gross per 24 hour  Intake 1471.15 ml  Output --  Net 1471.15 ml    Constitutional: General:   Alert,  Well-developed, well-nourished, pleasant and cooperative in NAD BP 95/60   Pulse 95   Temp 98.3 F (36.8 C) (Oral)   Resp (!) 23   Ht '5\' 3"'$  (1.6 m)   Wt 39 kg   SpO2 96%   BMI 15.23 kg/m   Eyes:  Sclera clear, no icterus.   Conjunctiva pink.   Ears:  No scars, lesions or masses, Normal auditory acuity. Nose:  No deformity, discharge, or lesions. Mouth:  No deformity or lesions, oropharynx pink & moist.  Neck:  Supple; no masses, trachea midline  Respiratory: Normal respiratory effort  Gastrointestinal:  Soft, non-tender and non-distended without masses, hepatosplenomegaly or hernias noted.  No guarding or rebound tenderness.     Cardiac: No clubbing or edema.  No cyanosis. Normal posterior tibial pedal pulses noted.  Lymphatic:  No significant cervical adenopathy.  Psych:  Alert and cooperative. Normal mood and affect.  Musculoskeletal:  Head normocephalic, atraumatic. 5/5 Lower extremity strength  bilaterally.  Skin: Warm. Intact without significant lesions or rashes. No jaundice.  Neurologic:  Face symmetrical, tongue midline, Normal sensation to touch  Psych:  Alert and oriented x3, Alert and cooperative. Normal mood and affect.   Lab Results: Lab Results  Component Value Date   WBC 13.5 (H) 07/04/2021   HGB 10.5 (L) 07/04/2021   HCT 29.8 (L) 07/04/2021   MCV 98.3 07/04/2021   PLT 243 07/04/2021   Micro Results: Recent Results (from the past 240 hour(s))  SARS CORONAVIRUS 2 (TAT 6-24 HRS) Nasopharyngeal Nasopharyngeal Swab     Status: None   Collection Time: 07/02/21  1:50 PM   Specimen: Nasopharyngeal Swab  Result Value Ref Range Status   SARS Coronavirus 2 NEGATIVE NEGATIVE Final    Comment: (NOTE) SARS-CoV-2 target nucleic acids are NOT DETECTED.  The SARS-CoV-2 RNA is generally detectable in upper and lower respiratory specimens during the acute phase of infection. Negative results do not preclude SARS-CoV-2 infection, do not rule out co-infections with other pathogens, and should not be used as the sole basis for treatment or other patient management decisions. Negative results must be combined with clinical observations, patient history, and epidemiological information. The expected result is Negative.  Fact Sheet for Patients: SugarRoll.be  Fact Sheet for Healthcare Providers: https://www.woods-mathews.com/  This test is not yet approved or cleared by  the [employer] and  has been authorized for detection and/or diagnosis of SARS-CoV-2 by FDA under an Emergency Use Authorization (EUA). This EUA will remain  in effect (meaning this test can be used) for the duration of the COVID-19 declaration under Se ction 564(b)(1) of the Act, 21 U.S.C. section 360bbb-3(b)(1), unless the authorization is terminated or revoked sooner.  Performed at [facility], [place] [address], [place], [place] [address]   MRSA  Next Gen by PCR, Nasal     Status: None   Collection Time: 07/02/21  8:26 PM   Specimen: Nasal Mucosa; Nasal Swab  Result Value Ref Range Status   MRSA by PCR Next Gen NOT DETECTED NOT DETECTED Final    Comment: (NOTE) The GeneXpert MRSA Assay (FDA approved for NASAL specimens only), is one component of a comprehensive MRSA colonization surveillance program. It is not intended to diagnose MRSA infection nor to guide or monitor treatment for MRSA infections. Test performance is not FDA approved in patients less than 68 years old. Performed at [facility], [address] [place], [place] [address]   Gastrointestinal Panel by PCR , Stool     Status: None   Collection Time: 07/04/21  5:52 AM   Specimen: Stool  Result Value Ref Range Status   Campylobacter species NOT DETECTED NOT DETECTED Final   Plesimonas shigelloides NOT DETECTED NOT DETECTED Final   Salmonella species NOT DETECTED NOT DETECTED Final   Yersinia enterocolitica NOT DETECTED NOT DETECTED Final   Vibrio species NOT DETECTED NOT DETECTED Final   Vibrio cholerae NOT DETECTED NOT DETECTED Final   Enteroaggregative E coli (EAEC) NOT DETECTED NOT DETECTED Final   Enteropathogenic E coli (EPEC) NOT DETECTED NOT DETECTED Final   Enterotoxigenic E coli (ETEC) NOT DETECTED NOT DETECTED Final   Shiga like toxin producing E coli (STEC) NOT DETECTED NOT DETECTED Final   Shigella/Enteroinvasive E coli (EIEC) NOT DETECTED NOT DETECTED Final   Cryptosporidium NOT DETECTED NOT DETECTED Final   Cyclospora cayetanensis NOT DETECTED NOT DETECTED Final   Entamoeba histolytica NOT DETECTED NOT DETECTED Final   Giardia lamblia NOT DETECTED NOT DETECTED Final   Adenovirus F40/41 NOT DETECTED NOT DETECTED Final   Astrovirus NOT DETECTED NOT DETECTED Final   Norovirus GI/GII NOT DETECTED NOT DETECTED Final   Rotavirus A NOT DETECTED NOT DETECTED Final   Sapovirus (I, II, IV, and V) NOT DETECTED NOT DETECTED Final    Comment:  Performed at [facility], [address] [place], [place] [address]  C Difficile Quick Screen w PCR reflex     Status: None   Collection Time: 07/04/21  5:52 AM   Specimen: STOOL  Result Value Ref Range Status   C Diff antigen NEGATIVE NEGATIVE Final   C Diff toxin NEGATIVE NEGATIVE Final   C Diff interpretation No C. difficile detected.  Final    Comment: Performed at [facility], [address] [place], [place] [address]   Studies/Results: [facility] Chest [place] 1 View  Result Date: 07/02/2021 CLINICAL DATA:  Nausea, vomiting, diarrhea EXAM: PORTABLE CHEST 1 VIEW COMPARISON:  None. FINDINGS: Single frontal view of the chest demonstrates an unremarkable cardiac silhouette. Since the CT scan performed earlier today, there is been development of central vascular congestion and patchy right infrahilar airspace disease suspicious for edema. No effusion or pneumothorax. No acute bony abnormalities. IMPRESSION: 1. Worsening volume status, with central vascular congestion and likely developing basilar edema. Electronically Signed   By: Legrand Como  Owens Shark M.D.   On: 07/02/2021 18:03   ECHOCARDIOGRAM COMPLETE  Result Date: 07/03/2021    ECHOCARDIOGRAM REPORT   Patient Name:   Carla Martinez Date of Exam: 07/03/2021 Medical Rec #:  [id number]            Height:       63.0 in Accession #:    [id number]           Weight:       86.0 lb Date of Birth:  01/12/1961            BSA:          1.351 m Patient Age:    60 years             BP:           94/63 mmHg Patient Gender: F                    HR:           78 bpm. Exam Location:  [facility] Procedure: 2D Echo, Cardiac Doppler and Color Doppler Indications:     Dyspnea R06.00                  Elevated troponin  History:         Patient has no prior history of Echocardiogram examinations.                  Arrythmias:Atrial Fibrillation. Anxiety, depression.  Sonographer:     Sherrie Sport Referring Phys:  [id number] AMY N COX Diagnosing Phys: Serafina Royals MD   Sonographer Comments: Suboptimal apical window. IMPRESSIONS  1. Left ventricular ejection fraction, by estimation, is 25 to 30%. The left ventricle has severely decreased function. The left ventricle demonstrates regional wall motion abnormalities (see scoring diagram/findings for description). The left ventricular internal cavity size was mildly to moderately dilated. Left ventricular diastolic parameters were normal.  2. Right ventricular systolic function is normal. The right ventricular size is normal.  3. Left atrial size was mildly dilated.  4. The mitral valve is normal in structure. Mild mitral valve regurgitation.  5. The aortic valve is normal in structure. Aortic valve regurgitation is trivial. FINDINGS  Left Ventricle: Left ventricular ejection fraction, by estimation, is 25 to 30%. The left ventricle has severely decreased function. The left ventricle demonstrates regional wall motion abnormalities. The left ventricular internal cavity size was mildly  to moderately dilated. There is no left ventricular hypertrophy. Left ventricular diastolic parameters were normal.  LV Wall Scoring: The entire apex, mid anterolateral segment, and mid inferoseptal segment are akinetic. The mid anteroseptal segment, mid inferolateral segment, mid anterior segment, and mid inferior segment are hypokinetic. The basal anteroseptal segment, basal inferolateral segment, basal anterolateral segment, basal anterior segment, basal inferior segment, and basal inferoseptal segment are normal. Right Ventricle: The right ventricular size is normal. No increase in right ventricular wall thickness. Right ventricular systolic function is normal. Left Atrium: Left atrial size was mildly dilated. Right Atrium: Right atrial size was normal in size. Pericardium: There is no evidence of pericardial effusion. Mitral Valve: The mitral valve is normal in structure. Mild mitral valve regurgitation. Tricuspid Valve: The tricuspid valve is normal  in structure. Tricuspid valve regurgitation is trivial. Aortic Valve: The aortic valve is normal in structure. Aortic valve regurgitation is trivial. Aortic valve mean gradient measures 1.0 mmHg. Aortic valve peak gradient measures 2.6 mmHg. Aortic valve area, by VTI measures 3.41 cm. Pulmonic Valve:  The pulmonic valve was normal in structure. Pulmonic valve regurgitation is not visualized. Aorta: The aortic root and ascending aorta are structurally normal, with no evidence of dilitation. IAS/Shunts: No atrial level shunt detected by color flow Doppler.  LEFT VENTRICLE PLAX 2D LVIDd:         4.10 cm     Diastology LVIDs:         4.00 cm     LV e' medial:    5.66 cm/s LV PW:         0.90 cm     LV E/e' medial:  11.5 LV IVS:        0.70 cm     LV e' lateral:   4.57 cm/s LVOT diam:     2.10 cm     LV E/e' lateral: 14.2 LV SV:         46 LV SV Index:   34 LVOT Area:     3.46 cm  LV Volumes (MOD) LV vol d, MOD A2C: 87.1 ml LV vol d, MOD A4C: 92.2 ml LV vol s, MOD A2C: 75.6 ml LV vol s, MOD A4C: 77.8 ml LV SV MOD A2C:     11.5 ml LV SV MOD A4C:     92.2 ml LV SV MOD BP:      9.1 ml RIGHT VENTRICLE RV Basal diam:  2.90 cm RV S prime:     7.72 cm/s TAPSE (M-mode): 3.0 cm LEFT ATRIUM             Index       RIGHT ATRIUM          Index LA diam:        3.50 cm 2.59 cm/m  RA Area:     8.31 cm LA Vol (A2C):   41.2 ml 30.50 ml/m RA Volume:   15.50 ml 11.47 ml/m LA Vol (A4C):   15.4 ml 11.40 ml/m LA Biplane Vol: 26.8 ml 19.84 ml/m  AORTIC VALVE                   PULMONIC VALVE AV Area (Vmax):    3.24 cm    PV Vmax:        0.41 m/s AV Area (Vmean):   3.14 cm    PV Peak grad:   0.7 mmHg AV Area (VTI):     3.41 cm    RVOT Peak grad: 1 mmHg AV Vmax:           80.30 cm/s AV Vmean:          55.200 cm/s AV VTI:            0.136 m AV Peak Grad:      2.6 mmHg AV Mean Grad:      1.0 mmHg LVOT Vmax:         75.10 cm/s LVOT Vmean:        50.000 cm/s LVOT VTI:          0.134 m LVOT/AV VTI ratio: 0.99  AORTA Ao Root diam: 3.30 cm  MITRAL VALVE               TRICUSPID VALVE MV Area (PHT): 3.83 cm    TR Peak grad:   34.8 mmHg MV Decel Time: 198 msec    TR Vmax:        295.00 cm/s MV E velocity: 65.10 cm/s MV A velocity: 42.40 cm/s  SHUNTS MV E/A ratio:  1.54  Systemic VTI:  0.13 m                            Systemic Diam: 2.10 cm Serafina Royals MD Electronically signed by Serafina Royals MD Signature Date/Time: 07/03/2021/1:16:59 PM    Final    Medications:  Scheduled Meds:  amitriptyline  25 mg Oral QHS   Chlorhexidine Gluconate Cloth  6 each Topical 99991111   folic acid  1 mg Oral Daily   magnesium oxide  400 mg Oral Daily   midodrine  10 mg Oral TID WC   multivitamin with minerals  1 tablet Oral Daily   pantoprazole  40 mg Oral BID   polyethylene glycol-electrolytes  4,000 mL Oral Once   QUEtiapine  100 mg Oral TID   thiamine  100 mg Oral Daily   Or   thiamine  100 mg Intravenous Daily   trazodone  50 mg Oral QHS   vitamin B-12  1,000 mcg Oral Daily   Continuous Infusions:  ampicillin-sulbactam (UNASYN) IV Stopped (07/04/21 0834)   dexmedetomidine (PRECEDEX) IV infusion 0.4 mcg/kg/hr (07/04/21 1153)   heparin 1,000 Units/hr (07/03/21 2246)   promethazine (PHENERGAN) injection (IM or IVPB) Stopped (07/03/21 1531)   PRN Meds:.acetaminophen **OR** acetaminophen, haloperidol lactate, hydrOXYzine, ipratropium-albuterol, morphine injection, ondansetron **OR** ondansetron (ZOFRAN) IV, oxyCODONE, promethazine (PHENERGAN) injection (IM or IVPB)   Assessment: Principal Problem:   Atrial fibrillation with RVR (HCC) Active Problems:   Mild recurrent major depression (HCC)   Acid reflux   Osteoporosis   History of bowel resection   Rectal prolapse   Diarrhea   Enteritis   Anorexia   Alcohol use   Anxiety   Insomnia   Coffee ground emesis   Complicated grief   Grief at loss of child   Hypotension, chronic   Pressure injury of skin    Plan: Altered mental status has resolved  Given her cardiac issues,  NSTEMI, heparin drip, would not recommend EGD and colonoscopy given that it is elective for work-up of chronic diarrhea, and sedation used during the procedure would put patient at risk in the setting of acute cardiac issues.  Risks of procedure in this acute setting without great benefits  Infectious work-up on this hospitalization is negative.  Start Imodium as needed given loose stools.  Discussed with primary team, Dr. Nicki Guadalajara, and he does not think there are any problems with starting Imodium.  Cases of torsades de points, cardiac arrest have been reported only in cases related use of higher than recommended dosage  Her abdomen is soft and nondistended she is not having any obstructive symptoms.  Continue cardiology work-up as per cardiology team and her cardiac issues may be playing a role in her nausea or bloating symptoms.  CT with contrast recently did not report any celiac stenosis  Is symptoms of bloating or nausea do not improve, can consider mesenteric Doppler or CT angiogram   LOS: 2 days   Carla Antigua, MD 07/04/2021, 1:04 PM

## 2021-07-04 NOTE — Progress Notes (Signed)
[place] for IV heparin Indication: chest pain/ACS/STEMI  Patient Measurements: Heparin Dosing Weight: 39 kg  Labs: Recent Labs    07/02/21 0644 07/02/21 1253 07/02/21 1304 07/02/21 1730 07/02/21 1846 07/03/21 0518 07/03/21 1450 07/03/21 2144 07/04/21 0524  HGB 13.0  --   --   --   --  10.3*  --   --  10.5*  HCT 37.1  --   --   --   --  29.4*  --   --  29.8*  PLT 428*  --   --   --   --  252  --   --  243  APTT  --   --  >200*  --   --   --   --   --   --   LABPROT  --   --  14.2  --   --   --   --   --   --   INR  --   --  1.1  --   --   --   --   --   --   HEPARINUNFRC  --   --   --   --    < > <0.10* 0.11* 0.18* 0.37  CREATININE 1.15*  --   --   --   --  0.77  --   --  0.69  TROPONINIHS 690* 1,135*  --  1,119*  --   --   --   --   --    < > = values in this interval not displayed.     Estimated Creatinine Clearance: 47.8 mL/min (by C-G formula based on SCr of 0.69 mg/dL).   Medical History: Past Medical History:  Diagnosis Date   Anxiety    Depression     Medications No anticoagulation prior to admission per my chart review  Assessment: [name] with PMH of anxiety, depression, and tobacco use presenting with nausea and vomiting. While in ED, found to have elevated troponins. No anticoagulation PTA. ECG resulted "minimal ST elevation, lateral leads".   0913 1846 HL < 0.10; subthera, 450 un/hr 0914  0518 HL < 0.10; subthera, 550 un/hr 0914 1450 HL     0.11; subthera, 700 un/hr > 850 0914  2144 HL     0.18; subthera 850 > 1000 units/hr  0915  0524 HL       0.37, therapeutic x 1   Goal of Therapy:  Heparin level 0.3-0.7 units/ml Monitor platelets by anticoagulation protocol: Yes   Plan:  --Heparin level is therapeutic --Continue heparin infusion at 1000 units/hr --confirmatory HL 6 hours  --CBC daily   Dorothe Pea, PharmD, BCPS Clinical Pharmacist   07/04/2021 6:42 AM

## 2021-07-04 NOTE — Progress Notes (Signed)
[facility] PROGRESS NOTE   Carla Martinez [id number] DOB: [birth year] DOA: 07/02/2021  PCP: [name], MD  Brief History/Interval Summary: 60 y.o. female with medical history significant for depression, anxiety, insomnia, history of bowel resection, anorexia, mild recurrent major depression, GERD, baseline hypotension, who presented to the emergency department for chief concerns of nausea, vomiting, diarrhea.  Patient has been very depressed and tearful since May 2022 when her eldest daughter passed away.  Apparently has been drinking alcohol and doing other recreational drugs including taking NyQuil.  Was supposed to undergo EGD and colonoscopy and was going through her bowel prep when she developed nausea vomiting.  This prompted admission to the hospital.  This was followed by alteration in mental status changes and agitation requiring transfer to the ICU and was placed on Precedex.    Consultants: Cardiology.  Gastroenterology  Procedures: None  Antibiotics: Anti-infectives (From admission, onward)    Start     Dose/Rate Route Frequency Ordered Stop   07/03/21 1400  Ampicillin-Sulbactam (UNASYN) 3 g in sodium chloride 0.9 % 100 mL IVPB        3 g 200 mL/hr over 30 Minutes Intravenous Every 6 hours 07/03/21 1045     07/02/21 1400  cefTRIAXone (ROCEPHIN) 1 g in sodium chloride 0.9 % 100 mL IVPB  Status:  Discontinued        1 g 200 mL/hr over 30 Minutes Intravenous Every 24 hours 07/02/21 1342 07/02/21 1352   07/02/21 1400  metroNIDAZOLE (FLAGYL) IVPB 500 mg  Status:  Discontinued        500 mg 100 mL/hr over 60 Minutes Intravenous Every 12 hours 07/02/21 1342 07/03/21 0955   07/02/21 1400  cefTRIAXone (ROCEPHIN) 2 g in sodium chloride 0.9 % 100 mL IVPB        2 g 200 mL/hr over 30 Minutes Intravenous  Once 07/02/21 1351 07/02/21 1447       Subjective/Interval History: Patient noted to be sitting up on the chair this morning.  Was experiencing chest pain  and shortness of breath earlier today.  More comfortable currently.  Feels very anxious currently.  Discussed with nursing staff.    Assessment/Plan:  Acute metabolic encephalopathy/drug withdrawal/alcohol withdrawal Concern was for withdrawal from alcohol and other illicit recreational drugs that she had been doing recently.   Due to significant agitation she had to be placed on Precedex infusion.  No focal neurological deficits were noted.  She did develop hypotension as a result of this medication.  Was being weaned off overnight.  Currently on 0.6 mcg/kg/h.   Also remains on Haldol as needed.  We will uptitrate some of her home medications which were reduced yesterday due to low blood pressure and unresponsiveness.   Urine drug screen positive for amphetamines, cannabinoid, phencyclidine as well as tricyclic.  Hypotension Based on previous records patient does have chronic hypotension.  This was made worse by Precedex infusion.  Started on midodrine.  Blood pressure seems to be stable.  Holding her metoprolol.  Sedative medications were placed on hold yesterday but some of them have been resumed today due to significant anxiety.  Monitor blood pressures closely.  Lactic acid level was only 2.0 with procalcitonin of only 0.27.  Low blood pressure unlikely to be due to sepsis.  Chronic systolic CHF Echocardiogram showed EF to be 25 to 30% which is new for this patient.  Cardiology is following.  Likely a Takotsubo's.  Patient going through grief as she lost her daughter  recently.  Unable to use ACE inhibitor or ARB or even beta-blockers due to low blood pressures.  Defer further management to cardiology.  Atrial fibrillation with RVR Converted to sinus rhythm after she was given diltiazem.  Seen by cardiology.  Placed on heparin infusion.  TSH 0.75.  Free T4 1.15.  Echocardiogram shows low EF 25 to 30%.  See above.  Possible NSTEMI Could be due to A. fib with RVR and acute illness.  Placed on  heparin infusion.  Cardiology is following.  Nausea vomiting and diarrhea/concern for GI bleed/gastroenteritis There was some concern for coffee-ground emesis.  Drop in hemoglobin is likely dilutional.  Patient has been taking increased amount of ibuprofen along with the use of alcohol recently.  Initially placed on Protonix infusion.  No overt bleeding has been noted.  We will change to twice a day Protonix.  C. difficile panel negative.    Aspiration pneumonia/acute respiratory failure with hypoxia Chest x-ray showed right-sided opacity.  She could have aspirated.  Currently on Unasyn.  Requiring 2 to 5 L of oxygen by nasal cannula.    Hypodense lesion in the liver with peripheral nodular enhancement CT scan suggest that this could be hemangioma.  Will need outpatient follow-up.  Hepatitis panel unremarkable  Hypokalemia Give additional dose today.  History of alcohol abuse On CIWA protocol.  On Precedex infusion.  History of depression and anxiety Severe anxiety also triggered recently by the loss of her daughter.  Patient seems to be quite anxious.  May benefit from psychiatry input at some point.  Stage I sacral decubitus Pressure Injury 07/02/21 Sacrum Medial Stage 1 -  Intact skin with non-blanchable redness of a localized area usually over a bony prominence. skin intact, pink, non-blanchable redness (Active)  07/02/21 2030  Location: Sacrum  Location Orientation: Medial  Staging: Stage 1 -  Intact skin with non-blanchable redness of a localized area usually over a bony prominence.  Wound Description (Comments): skin intact, pink, non-blanchable redness  Present on Admission: Yes     DVT Prophylaxis: On heparin infusion Code Status: DNR Family Communication: No family at bedside Disposition Plan: Hopefully return home when improved  Status is: Inpatient  Remains inpatient appropriate because:Altered mental status, Ongoing diagnostic testing needed not appropriate for  outpatient work up, and IV treatments appropriate due to intensity of illness or inability to take PO  Dispo: The patient is from: Home              Anticipated d/c is to: Home              Patient currently is not medically stable to d/c.   Difficult to place patient No       Medications: Scheduled:  amitriptyline  25 mg Oral QHS   Chlorhexidine Gluconate Cloth  6 each Topical [id number]   folic acid  1 mg Oral Daily   magnesium oxide  400 mg Oral Daily   midodrine  10 mg Oral TID WC   multivitamin with minerals  1 tablet Oral Daily   [START ON 07/06/2021] pantoprazole  40 mg Intravenous Q12H   polyethylene glycol-electrolytes  4,000 mL Oral Once   QUEtiapine  100 mg Oral TID   thiamine  100 mg Oral Daily   Or   thiamine  100 mg Intravenous Daily   trazodone  50 mg Oral QHS   vitamin B-12  1,000 mcg Oral Daily   Continuous:  ampicillin-sulbactam (UNASYN) IV 3 g (07/04/21 0804)   dexmedetomidine (PRECEDEX)  IV infusion 0.4 mcg/kg/hr (07/04/21 0722)   heparin 1,000 Units/hr (07/03/21 2246)   magnesium sulfate bolus IVPB     pantoprazole 8 mg/hr (07/04/21 0803)   promethazine (PHENERGAN) injection (IM or IVPB) Stopped (07/03/21 1531)   [id number] **OR** acetaminophen, haloperidol lactate, hydrOXYzine, ipratropium-albuterol, morphine injection, ondansetron **OR** ondansetron (ZOFRAN) IV, oxyCODONE, promethazine (PHENERGAN) injection (IM or IVPB)   Objective:  Vital Signs  Vitals:   07/04/21 0600 07/04/21 0800 07/04/21 0900 07/04/21 1000  BP: (!) 85/54 (!) 93/58 (!) 81/52 (!) 68/54  Pulse: 76 85 71 89  Resp: (!) 25 (!) 22 (!) 23 (!) 24  Temp:  98.3 F (36.8 C)    TempSrc:  Oral    SpO2: 94% 95% 93% 94%  Weight:      Height:        Intake/Output Summary (Last 24 hours) at 07/04/2021 1016 Last data filed at 07/04/2021 0900 Gross per 24 hour  Intake 1022.68 ml  Output --  Net 1022.68 ml    Filed Weights   07/02/21 0633  Weight: 39 kg    General appearance:  Much more awake and alert today.  Not as agitated but seems to be very anxious. Resp: Mildly tachypneic.  Few crackles in the bases bilaterally.  No wheezing or rhonchi. Cardio: S1-S2 is normal regular.  No S3-S4.  No rubs murmurs or bruit GI: Abdomen is soft.  Nontender nondistended.  Bowel sounds are present normal.  No masses organomegaly Extremities: No edema.  Full range of motion of lower extremities. Neurologic: Alert and oriented x3.  No focal neurological deficits.     Lab Results:  Data Reviewed: I have personally reviewed following labs and imaging studies  CBC: Recent Labs  Lab 07/02/21 0644 07/03/21 0518 07/04/21 0524  WBC 16.3* 18.3* 13.5*  NEUTROABS 14.3*  --   --   HGB 13.0 10.3* 10.5*  HCT 37.1 29.4* 29.8*  MCV 94.2 96.7 98.3  PLT 428* 252 243     Basic Metabolic Panel: Recent Labs  Lab 07/02/21 0644 07/02/21 0906 07/03/21 0518 07/04/21 0524  NA 137  --  134* 136  K 3.0*  --  4.2 3.6  CL 84*  --  97* 100  CO2 30  --  28 27  GLUCOSE 215*  --  126* 84  BUN 28*  --  18 16  CREATININE 1.15*  --  0.77 0.69  CALCIUM 10.6*  --  8.0* 7.6*  MG  --  1.8  --  1.5*  PHOS 2.9  --   --   --      GFR: Estimated Creatinine Clearance: 47.8 mL/min (by C-G formula based on SCr of 0.69 mg/dL).  Liver Function Tests: Recent Labs  Lab 07/02/21 0644 07/04/21 0524  AST 67* 37  ALT 44 37  ALKPHOS 67 46  BILITOT 0.6 0.7  PROT 7.2 5.0*  ALBUMIN 4.3 2.6*     Recent Labs  Lab 07/02/21 0644  LIPASE 30     Coagulation Profile: Recent Labs  Lab 07/02/21 1304  INR 1.1      Thyroid Function Tests: Recent Labs    07/02/21 1103  TSH 0.751  FREET4 1.15*      Recent Results (from the past 240 hour(s))  SARS CORONAVIRUS 2 (TAT 6-24 HRS) Nasopharyngeal Nasopharyngeal Swab     Status: None   Collection Time: 07/02/21  1:50 PM   Specimen: Nasopharyngeal Swab  Result Value Ref Range Status   SARS Coronavirus 2 NEGATIVE NEGATIVE  Final    Comment:  (NOTE) SARS-CoV-2 target nucleic acids are NOT DETECTED.  The SARS-CoV-2 RNA is generally detectable in upper and lower respiratory specimens during the acute phase of infection. Negative results do not preclude SARS-CoV-2 infection, do not rule out co-infections with other pathogens, and should not be used as the sole basis for treatment or other patient management decisions. Negative results must be combined with clinical observations, patient history, and epidemiological information. The expected result is Negative.  Fact Sheet for Patients: SugarRoll.be  Fact Sheet for Healthcare Providers: https://www.woods-mathews.com/  This test is not yet approved or cleared by the [place] FDA and  has been authorized for detection and/or diagnosis of SARS-CoV-2 by FDA under an Emergency Use Authorization (EUA). This EUA will remain  in effect (meaning this test can be used) for the duration of the COVID-19 declaration under Se ction 564(b)(1) of the Act, 21 U.S.C. section 360bbb-3(b)(1), unless the authorization is terminated or revoked sooner.  Performed at [facility], [place] [address] [place], [place] [address]   MRSA Next Gen by PCR, Nasal     Status: None   Collection Time: 07/02/21  8:26 PM   Specimen: Nasal Mucosa; Nasal Swab  Result Value Ref Range Status   MRSA by PCR Next Gen NOT DETECTED NOT DETECTED Final    Comment: (NOTE) The GeneXpert MRSA Assay (FDA approved for NASAL specimens only), is one component of a comprehensive MRSA colonization surveillance program. It is not intended to diagnose MRSA infection nor to guide or monitor treatment for MRSA infections. Test performance is not FDA approved in patients less than 55 years old. Performed at [facility], [address] [place], [place] [address]   Gastrointestinal Panel by PCR , Stool     Status: None   Collection Time: 07/04/21  5:52 AM   Specimen:  Stool  Result Value Ref Range Status   Campylobacter species NOT DETECTED NOT DETECTED Final   Plesimonas shigelloides NOT DETECTED NOT DETECTED Final   Salmonella species NOT DETECTED NOT DETECTED Final   Yersinia enterocolitica NOT DETECTED NOT DETECTED Final   Vibrio species NOT DETECTED NOT DETECTED Final   Vibrio cholerae NOT DETECTED NOT DETECTED Final   Enteroaggregative E coli (EAEC) NOT DETECTED NOT DETECTED Final   Enteropathogenic E coli (EPEC) NOT DETECTED NOT DETECTED Final   Enterotoxigenic E coli (ETEC) NOT DETECTED NOT DETECTED Final   Shiga like toxin producing E coli (STEC) NOT DETECTED NOT DETECTED Final   Shigella/Enteroinvasive E coli (EIEC) NOT DETECTED NOT DETECTED Final   Cryptosporidium NOT DETECTED NOT DETECTED Final   Cyclospora cayetanensis NOT DETECTED NOT DETECTED Final   Entamoeba histolytica NOT DETECTED NOT DETECTED Final   Giardia lamblia NOT DETECTED NOT DETECTED Final   Adenovirus F40/41 NOT DETECTED NOT DETECTED Final   Astrovirus NOT DETECTED NOT DETECTED Final   Norovirus GI/GII NOT DETECTED NOT DETECTED Final   Rotavirus A NOT DETECTED NOT DETECTED Final   Sapovirus (I, II, IV, and V) NOT DETECTED NOT DETECTED Final    Comment: Performed at [facility], [address] [place], [place] [address]  C Difficile Quick Screen w PCR reflex     Status: None   Collection Time: 07/04/21  5:52 AM   Specimen: STOOL  Result Value Ref Range Status   C Diff antigen NEGATIVE NEGATIVE Final   C Diff toxin NEGATIVE NEGATIVE Final   C Diff interpretation No C. difficile detected.  Final  Comment: Performed at [facility], [address] [place], [place] [address]       Radiology Studies: CT ABDOMEN PELVIS W CONTRAST  Result Date: 07/02/2021 CLINICAL DATA:  Abdominal distention, nausea, vomiting common diarrhea EXAM: CT ABDOMEN AND PELVIS WITH CONTRAST TECHNIQUE: Multidetector CT imaging of the abdomen and pelvis was performed using  the standard protocol following bolus administration of intravenous contrast. CONTRAST:  67m OMNIPAQUE IOHEXOL 350 MG/ML SOLN COMPARISON:  None. FINDINGS: Lower chest: The lung bases are clear. The imaged heart is unremarkable. Hepatobiliary: The liver is enlarged there is a 1.7 cm by 1.2 cm by 1.6 cm hypodense lesion in hepatic segment VI with foci of peripheral nodular enhancement. There is moderate periportal edema, nonspecific. The gallbladder is surgically absent. Mild prominence of the extrahepatic bile ducts is likely due to the history of cholecystectomy. Pancreas: Unremarkable. Spleen: Unremarkable. Adrenals/Urinary Tract: The adrenals are unremarkable. The kidneys are normal, with no focal lesion, stone, hydronephrosis, or hydroureter. The bladder is unremarkable. Stomach/Bowel: The stomach is unremarkable. There are multiple loops of small bowel in the left upper quadrant with a moderately thickened wall measuring up to 8 mm. There is no evidence of mechanical bowel obstruction. Vascular/Lymphatic: There is scattered calcified atherosclerotic plaque throughout the nonaneurysmal abdominal aorta. The major branch vessels are patent. There is no abdominal or pelvic lymphadenopathy. Reproductive: The uterus and adnexa are unremarkable. Other: There is no ascites or free air. Musculoskeletal: There is scalloping of the posterior endplate of the L5 vertebral body with widening of the right neural foramen raising suspicion for a nerve sheath tumor (6-50). Ovoid hypodense lesions are also seen in the sacral canal. There is age-indeterminate compression deformity of the L2 vertebral body with approximately 30% loss of vertebral body height. There is minimal bony retropulsion at this level. IMPRESSION: 1. Multiple loops of small bowel with moderately thickened wall in the left upper quadrant suggesting nonspecific infectious or inflammatory enteritis. 2. Moderate periportal edema is nonspecific and can be seen in  the setting of heart failure, hepatitis, among other etiologies. Correlate with liver function tests and history, and consider correlation with hepatitis testing as indicated. 3. Indeterminate hypodense lesion in the liver with peripheral nodular enhancement most likely reflects a hemangioma; however, recommend nonemergent outpatient liver protocol MRI of the abdomen with and without contrast for characterization. 4. 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 Recommend nonemergent lumbar spine MRI with and without contrast for further evaluation. 5. Age-indeterminate compression deformity of the L2 vertebral body. Correlate with point tenderness, and MRI may be considered for evaluation of chronicity. If this MRI is obtained during this ED/inpatient visit, postcontrast imaging is recommended for evaluation of the suspected nerve sheath tumor. Aortic Atherosclerosis (ICD10-I70.0). Electronically Signed   By: PValetta MoleM.D.   On: 07/02/2021 11:19   DG Chest Port 1 View  Result Date: 07/02/2021 CLINICAL DATA:  Nausea, vomiting, diarrhea EXAM: PORTABLE CHEST 1 VIEW COMPARISON:  None. FINDINGS: Single frontal view of the chest demonstrates an unremarkable cardiac silhouette. Since the CT scan performed earlier today, there is been development of central vascular congestion and patchy right infrahilar airspace disease suspicious for edema. No effusion or pneumothorax. No acute bony abnormalities. IMPRESSION: 1. Worsening volume status, with central vascular congestion and likely developing basilar edema. Electronically Signed   By: MRanda NgoM.D.   On: 07/02/2021 18:03   ECHOCARDIOGRAM COMPLETE  Result Date: 07/03/2021    ECHOCARDIOGRAM REPORT   Patient Name:   Carla Martinez Date of Exam: 07/03/2021 Medical Rec #:  [id number]            Height:       63.0 in Accession #:    [id number]            Weight:       86.0 lb Date of Birth:  08/13/1961            BSA:          1.351 m Patient Age:    42 years             BP:           94/63 mmHg Patient Gender: F                    HR:           78 bpm. Exam Location:  [facility] Procedure: 2D Echo, Cardiac Doppler and Color Doppler Indications:     Dyspnea R06.00                  Elevated troponin  History:         Patient has no prior history of Echocardiogram examinations.                  Arrythmias:Atrial Fibrillation. Anxiety, depression.  Sonographer:     Sherrie Sport Referring Phys:  [id number] AMY N COX Diagnosing Phys: Serafina Royals MD  Sonographer Comments: Suboptimal apical window. IMPRESSIONS  1. Left ventricular ejection fraction, by estimation, is 25 to 30%. The left ventricle has severely decreased function. The left ventricle demonstrates regional wall motion abnormalities (see scoring diagram/findings for description). The left ventricular internal cavity size was mildly to moderately dilated. Left ventricular diastolic parameters were normal.  2. Right ventricular systolic function is normal. The right ventricular size is normal.  3. Left atrial size was mildly dilated.  4. The mitral valve is normal in structure. Mild mitral valve regurgitation.  5. The aortic valve is normal in structure. Aortic valve regurgitation is trivial. FINDINGS  Left Ventricle: Left ventricular ejection fraction, by estimation, is 25 to 30%. The left ventricle has severely decreased function. The left ventricle demonstrates regional wall motion abnormalities. The left ventricular internal cavity size was mildly  to moderately dilated. There is no left ventricular hypertrophy. Left ventricular diastolic parameters were normal.  LV Wall Scoring: The entire apex, mid anterolateral segment, and mid inferoseptal segment are akinetic. The mid anteroseptal segment, mid inferolateral segment, mid anterior segment, and mid inferior segment are hypokinetic. The basal anteroseptal segment,  basal inferolateral segment, basal anterolateral segment, basal anterior segment, basal inferior segment, and basal inferoseptal segment are normal. Right Ventricle: The right ventricular size is normal. No increase in right ventricular wall thickness. Right ventricular systolic function is normal. Left Atrium: Left atrial size was mildly dilated. Right Atrium: Right atrial size was normal in size. Pericardium: There is no evidence of pericardial effusion. Mitral Valve: The mitral valve is normal in structure. Mild mitral valve regurgitation. Tricuspid Valve: The tricuspid valve is normal in structure. Tricuspid valve regurgitation is trivial. Aortic Valve: The aortic valve is normal in structure. Aortic valve regurgitation is trivial. Aortic valve mean gradient measures 1.0 mmHg. Aortic valve peak gradient measures 2.6 mmHg. Aortic valve area, by VTI measures 3.41 cm. Pulmonic Valve: The pulmonic valve was normal in structure. Pulmonic valve regurgitation is not  visualized. Aorta: The aortic root and ascending aorta are structurally normal, with no evidence of dilitation. IAS/Shunts: No atrial level shunt detected by color flow Doppler.  LEFT VENTRICLE PLAX 2D LVIDd:         4.10 cm     Diastology LVIDs:         4.00 cm     LV e' medial:    5.66 cm/s LV PW:         0.90 cm     LV E/e' medial:  11.5 LV IVS:        0.70 cm     LV e' lateral:   4.57 cm/s LVOT diam:     2.10 cm     LV E/e' lateral: 14.2 LV SV:         46 LV SV Index:   34 LVOT Area:     3.46 cm  LV Volumes (MOD) LV vol d, MOD A2C: 87.1 ml LV vol d, MOD A4C: 92.2 ml LV vol s, MOD A2C: 75.6 ml LV vol s, MOD A4C: 77.8 ml LV SV MOD A2C:     11.5 ml LV SV MOD A4C:     92.2 ml LV SV MOD BP:      9.1 ml RIGHT VENTRICLE RV Basal diam:  2.90 cm RV S prime:     7.72 cm/s TAPSE (M-mode): 3.0 cm LEFT ATRIUM             Index       RIGHT ATRIUM          Index LA diam:        3.50 cm 2.59 cm/m  RA Area:     8.31 cm LA Vol (A2C):   41.2 ml 30.50 ml/m RA Volume:    15.50 ml 11.47 ml/m LA Vol (A4C):   15.4 ml 11.40 ml/m LA Biplane Vol: 26.8 ml 19.84 ml/m  AORTIC VALVE                   PULMONIC VALVE AV Area (Vmax):    3.24 cm    PV Vmax:        0.41 m/s AV Area (Vmean):   3.14 cm    PV Peak grad:   0.7 mmHg AV Area (VTI):     3.41 cm    RVOT Peak grad: 1 mmHg AV Vmax:           80.30 cm/s AV Vmean:          55.200 cm/s AV VTI:            0.136 m AV Peak Grad:      2.6 mmHg AV Mean Grad:      1.0 mmHg LVOT Vmax:         75.10 cm/s LVOT Vmean:        50.000 cm/s LVOT VTI:          0.134 m LVOT/AV VTI ratio: 0.99  AORTA Ao Root diam: 3.30 cm MITRAL VALVE               TRICUSPID VALVE MV Area (PHT): 3.83 cm    TR Peak grad:   34.8 mmHg MV Decel Time: 198 msec    TR Vmax:        295.00 cm/s MV E velocity: 65.10 cm/s MV A velocity: 42.40 cm/s  SHUNTS MV E/A ratio:  1.54        Systemic VTI:  0.13 m  Systemic Diam: 2.10 cm Serafina Royals MD Electronically signed by Serafina Royals MD Signature Date/Time: 07/03/2021/1:16:59 PM    Final        LOS: 2 days   Bonnielee Haff  [facility] Pager on [email or web address]  07/04/2021, 10:16 AM

## 2021-07-04 NOTE — Progress Notes (Addendum)
Patient gave mother the $70 in her wallet, checkbook and debit card and pill case to mother.  Also gave her a [employer] credit card

## 2021-07-04 NOTE — Plan of Care (Signed)

## 2021-07-04 NOTE — Progress Notes (Signed)
ANTICOAGULATION CONSULT NOTE  Pharmacy Consult for IV heparin Indication: chest pain/ACS/STEMI  Patient Measurements: Heparin Dosing Weight: 39 kg  Labs: Recent Labs    07/02/21 0644 07/02/21 1253 07/02/21 1304 07/02/21 1730 07/02/21 1846 07/03/21 0518 07/03/21 1450 07/04/21 0524 07/04/21 1238 07/04/21 2123  HGB 13.0  --   --   --   --  10.3*  --  10.5*  --   --   HCT 37.1  --   --   --   --  29.4*  --  29.8*  --   --   PLT 428*  --   --   --   --  252  --  243  --   --   APTT  --   --  >200*  --   --   --   --   --   --   --   LABPROT  --   --  14.2  --   --   --   --   --   --   --   INR  --   --  1.1  --   --   --   --   --   --   --   HEPARINUNFRC  --   --   --   --    < > <0.10*   < > 0.37 0.20* 0.28*  CREATININE 1.15*  --   --   --   --  0.77  --  0.69  --   --   TROPONINIHS 690* 1,135*  --  1,119*  --   --   --   --   --   --    < > = values in this interval not displayed.     Estimated Creatinine Clearance: 47.8 mL/min (by C-G formula based on SCr of 0.69 mg/dL).   Medical History: Past Medical History:  Diagnosis Date   Anxiety    Depression     Medications No anticoagulation prior to admission per my chart review  Assessment: [name] with PMH of anxiety, depression, and tobacco use presenting with nausea and vomiting. While in ED, found to have elevated troponins. No anticoagulation PTA. ECG resulted "minimal ST elevation, lateral leads".   0913 1846 HL < 0.10; subthera, 450 un/hr 0914  0518 HL < 0.10; subthera, 550 un/hr 0914 1450 HL     0.11; subthera, 700 un/hr 0914  2144 HL    0.18; subthera, 850 un/hr 0915  0524 HL    0.37; thera, 1000 un/hr 0915  1238 HL    0.20, subthera; 1000 un/hr 0915   2123 HL   0.28, subthera 1050> 1150 units/hr Goal of Therapy:  Heparin level 0.3-0.7 units/ml Monitor platelets by anticoagulation protocol: Yes   Plan:  --Heparin level is subtherapeutic --Heparin 600 unit IV bolus and increase heparin infusion rate to  1150 units/hr --Re-check HL in 6 hours following rate change  --CBC daily per protocol   Dorothe Pea, PharmD, BCPS Clinical Pharmacist   07/04/2021 10:01 PM

## 2021-07-04 NOTE — Progress Notes (Signed)
[facility] Cardiology [facility] Encounter Note  Patient: Carla Martinez / Admit Date: 07/02/2021 / Date of Encounter: 07/04/2021, 9:20 AM   Subjective: Patient currently awake and sitting in the chair.  She complains of some shortness of breath and chest tightness this morning.  Patient states that he has tightness is diffuse and restricts her from taking a full breath.   Patient was admitted for mainly nausea and vomiting and had been obtunded and unable to converse until this morning.  EKG has shown continued diffuse anterior precordial ST and T wave changes.  Troponin level peaked at 1135 on 07/02/2021.  Echocardiogram showing severe segmental LV systolic dysfunction with mid to apical akinesis and ejection fraction of 25 to 30%.  No evidence of significant valvular heart disease.  Pattern of wall motion abnormalities suggest the possibility of Takotsubo and/or stress-induced cardiomyopathy  Review of Systems: Review of Systems  Constitutional:  Negative for chills and fever.  HENT:  Negative for congestion.   Eyes:  Negative for blurred vision.  Respiratory:  Positive for shortness of breath. Negative for cough and sputum production.   Cardiovascular:  Positive for chest pain. Negative for palpitations, orthopnea, leg swelling and PND.  Gastrointestinal:  Negative for heartburn.  Neurological:  Negative for dizziness.     Objective: Telemetry: Normal sinus rhythm Physical Exam: Blood pressure (!) 81/52, pulse 71, temperature 98.3 F (36.8 C), temperature source Oral, resp. rate (!) 23, height '5\' 3"'$  (1.6 m), weight 39 kg, SpO2 93 %. Body mass index is 15.23 kg/m. General: Well developed, well nourished, in no acute distress. Head: Normocephalic, atraumatic, sclera non-icteric, no xanthomas, nares are without discharge. Neck: No apparent masses Lungs: Normal respirations with no wheezes, no rhonchi, no rales , no crackles   Heart: Regular rate and rhythm, normal S1 S2, no  murmur, no rub, no gallop, PMI is normal size and placement, carotid upstroke normal without bruit, jugular venous pressure normal Abdomen: Soft, non-tender, non-distended with normoactive bowel sounds. No hepatosplenomegaly. Abdominal aorta is normal size without bruit Extremities: No edema, no clubbing, no cyanosis, no ulcers,  Peripheral: 2+ radial, 2+ femoral, 2+ dorsal pedal pulses Neuro: alert and oriented. Moves all extremities spontaneously. Psych: responds to questions appropriately with a normal affect.   Intake/Output Summary (Last 24 hours) at 07/04/2021 0920 Last data filed at 07/04/2021 [id number] Gross per 24 hour  Intake 782.68 ml  Output --  Net 782.68 ml     Inpatient Medications:  . amitriptyline  25 mg Oral QHS  . Chlorhexidine Gluconate Cloth  6 each Topical Q0600  . folic acid  1 mg Oral Daily  . magnesium oxide  400 mg Oral Daily  . midodrine  10 mg Oral TID WC  . multivitamin with minerals  1 tablet Oral Daily  . [START ON 07/06/2021] pantoprazole  40 mg Intravenous Q12H  . polyethylene glycol-electrolytes  4,000 mL Oral Once  . QUEtiapine  100 mg Oral TID  . thiamine  100 mg Oral Daily   Or  . thiamine  100 mg Intravenous Daily  . trazodone  50 mg Oral QHS  . vitamin B-12  1,000 mcg Oral Daily   Infusions:  . ampicillin-sulbactam (UNASYN) IV 3 g (07/04/21 0804)  . dexmedetomidine (PRECEDEX) IV infusion 0.4 mcg/kg/hr (07/04/21 0722)  . heparin 1,000 Units/hr (07/03/21 2246)  . magnesium sulfate bolus IVPB    . pantoprazole 8 mg/hr (07/04/21 0803)  . promethazine (PHENERGAN) injection (IM or IVPB) Stopped (07/03/21 1531)  Labs: Recent Labs    07/02/21 0644 07/02/21 0906 07/03/21 0518 07/04/21 0524  NA 137  --  134* 136  K 3.0*  --  4.2 3.6  CL 84*  --  97* 100  CO2 30  --  28 27  GLUCOSE 215*  --  126* 84  BUN 28*  --  18 16  CREATININE 1.15*  --  0.77 0.69  CALCIUM 10.6*  --  8.0* 7.6*  MG  --  1.8  --  1.5*  PHOS 2.9  --   --   --      Recent Labs    07/02/21 0644 07/04/21 0524  AST 67* 37  ALT 44 37  ALKPHOS 67 46  BILITOT 0.6 0.7  PROT 7.2 5.0*  ALBUMIN 4.3 2.6*    Recent Labs    07/02/21 0644 07/03/21 0518 07/04/21 0524  WBC 16.3* 18.3* 13.5*  NEUTROABS 14.3*  --   --   HGB 13.0 10.3* 10.5*  HCT 37.1 29.4* 29.8*  MCV 94.2 96.7 98.3  PLT 428* 252 243    No results for input(s): CKTOTAL, CKMB, TROPONINI in the last 72 hours. Invalid input(s): POCBNP No results for input(s): HGBA1C in the last 72 hours.   Weights: Filed Weights   07/02/21 [id number]  Weight: 39 kg     Radiology/Studies:  CT ABDOMEN PELVIS W CONTRAST  Result Date: 07/02/2021 CLINICAL DATA:  Abdominal distention, nausea, vomiting common diarrhea EXAM: CT ABDOMEN AND PELVIS WITH CONTRAST TECHNIQUE: Multidetector CT imaging of the abdomen and pelvis was performed using the standard protocol following bolus administration of intravenous contrast. CONTRAST:  66m OMNIPAQUE IOHEXOL 350 MG/ML SOLN COMPARISON:  None. FINDINGS: Lower chest: The lung bases are clear. The imaged heart is unremarkable. Hepatobiliary: The liver is enlarged there is a 1.7 cm by 1.2 cm by 1.6 cm hypodense lesion in hepatic segment VI with foci of peripheral nodular enhancement. There is moderate periportal edema, nonspecific. The gallbladder is surgically absent. Mild prominence of the extrahepatic bile ducts is likely due to the history of cholecystectomy. Pancreas: Unremarkable. Spleen: Unremarkable. Adrenals/Urinary Tract: The adrenals are unremarkable. The kidneys are normal, with no focal lesion, stone, hydronephrosis, or hydroureter. The bladder is unremarkable. Stomach/Bowel: The stomach is unremarkable. There are multiple loops of small bowel in the left upper quadrant with a moderately thickened wall measuring up to 8 mm. There is no evidence of mechanical bowel obstruction. Vascular/Lymphatic: There is scattered calcified atherosclerotic plaque throughout the  nonaneurysmal abdominal aorta. The major branch vessels are patent. There is no abdominal or pelvic lymphadenopathy. Reproductive: The uterus and adnexa are unremarkable. Other: There is no ascites or free air. Musculoskeletal: There is scalloping of the posterior endplate of the L5 vertebral body with widening of the right neural foramen raising suspicion for a nerve sheath tumor (6-50). Ovoid hypodense lesions are also seen in the sacral canal. There is age-indeterminate compression deformity of the L2 vertebral body with approximately 30% loss of vertebral body height. There is minimal bony retropulsion at this level. IMPRESSION: 1. Multiple loops of small bowel with moderately thickened wall in the left upper quadrant suggesting nonspecific infectious or inflammatory enteritis. 2. Moderate periportal edema is nonspecific and can be seen in the setting of heart failure, hepatitis, among other etiologies. Correlate with liver function tests and history, and consider correlation with hepatitis testing as indicated. 3. Indeterminate hypodense lesion in the liver with peripheral nodular enhancement most likely reflects a hemangioma; however, recommend nonemergent outpatient liver  protocol MRI of the abdomen with and without contrast for characterization. 4. 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 Recommend nonemergent lumbar spine MRI with and without contrast for further evaluation. 5. Age-indeterminate compression deformity of the L2 vertebral body. Correlate with point tenderness, and MRI may be considered for evaluation of chronicity. If this MRI is obtained during this ED/inpatient visit, postcontrast imaging is recommended for evaluation of the suspected nerve sheath tumor. Aortic Atherosclerosis (ICD10-I70.0). Electronically Signed   By: Valetta Mole M.D.   On: 07/02/2021 11:19   DG Chest Port 1  View  Result Date: 07/02/2021 CLINICAL DATA:  Nausea, vomiting, diarrhea EXAM: PORTABLE CHEST 1 VIEW COMPARISON:  None. FINDINGS: Single frontal view of the chest demonstrates an unremarkable cardiac silhouette. Since the CT scan performed earlier today, there is been development of central vascular congestion and patchy right infrahilar airspace disease suspicious for edema. No effusion or pneumothorax. No acute bony abnormalities. IMPRESSION: 1. Worsening volume status, with central vascular congestion and likely developing basilar edema. Electronically Signed   By: Randa Ngo M.D.   On: 07/02/2021 18:03   ECHOCARDIOGRAM COMPLETE  Result Date: 07/03/2021    ECHOCARDIOGRAM REPORT   Patient Name:   Carla Martinez Date of Exam: 07/03/2021 Medical Rec #:  [id number]            Height:       63.0 in Accession #:    [id number]           Weight:       86.0 lb Date of Birth:  [birth year]            BSA:          1.351 m Patient Age:    25 years             BP:           94/63 mmHg Patient Gender: F                    HR:           78 bpm. Exam Location:  [facility] Procedure: 2D Echo, Cardiac Doppler and Color Doppler Indications:     Dyspnea R06.00                  Elevated troponin  History:         Patient has no prior history of Echocardiogram examinations.                  Arrythmias:Atrial Fibrillation. Anxiety, depression.  Sonographer:     Sherrie Sport Referring Phys:  [id number] AMY N COX Diagnosing Phys: Serafina Royals MD  Sonographer Comments: Suboptimal apical window. IMPRESSIONS  1. Left ventricular ejection fraction, by estimation, is 25 to 30%. The left ventricle has severely decreased function. The left ventricle demonstrates regional wall motion abnormalities (see scoring diagram/findings for description). The left ventricular internal cavity size was mildly to moderately dilated. Left ventricular diastolic parameters were normal.  2. Right ventricular systolic function is normal. The right ventricular  size is normal.  3. Left atrial size was mildly dilated.  4. The mitral valve is normal in structure. Mild mitral valve regurgitation.  5. The aortic valve is normal in structure. Aortic valve regurgitation is trivial. FINDINGS  Left Ventricle: Left ventricular ejection fraction, by estimation, is 25 to 30%. The left ventricle has severely decreased  function. The left ventricle demonstrates regional wall motion abnormalities. The left ventricular internal cavity size was mildly  to moderately dilated. There is no left ventricular hypertrophy. Left ventricular diastolic parameters were normal.  LV Wall Scoring: The entire apex, mid anterolateral segment, and mid inferoseptal segment are akinetic. The mid anteroseptal segment, mid inferolateral segment, mid anterior segment, and mid inferior segment are hypokinetic. The basal anteroseptal segment, basal inferolateral segment, basal anterolateral segment, basal anterior segment, basal inferior segment, and basal inferoseptal segment are normal. Right Ventricle: The right ventricular size is normal. No increase in right ventricular wall thickness. Right ventricular systolic function is normal. Left Atrium: Left atrial size was mildly dilated. Right Atrium: Right atrial size was normal in size. Pericardium: There is no evidence of pericardial effusion. Mitral Valve: The mitral valve is normal in structure. Mild mitral valve regurgitation. Tricuspid Valve: The tricuspid valve is normal in structure. Tricuspid valve regurgitation is trivial. Aortic Valve: The aortic valve is normal in structure. Aortic valve regurgitation is trivial. Aortic valve mean gradient measures 1.0 mmHg. Aortic valve peak gradient measures 2.6 mmHg. Aortic valve area, by VTI measures 3.41 cm. Pulmonic Valve: The pulmonic valve was normal in structure. Pulmonic valve regurgitation is not visualized. Aorta: The aortic root and ascending aorta are structurally normal, with no evidence of dilitation.  IAS/Shunts: No atrial level shunt detected by color flow Doppler.  LEFT VENTRICLE PLAX 2D LVIDd:         4.10 cm     Diastology LVIDs:         4.00 cm     LV e' medial:    5.66 cm/s LV PW:         0.90 cm     LV E/e' medial:  11.5 LV IVS:        0.70 cm     LV e' lateral:   4.57 cm/s LVOT diam:     2.10 cm     LV E/e' lateral: 14.2 LV SV:         46 LV SV Index:   34 LVOT Area:     3.46 cm  LV Volumes (MOD) LV vol d, MOD A2C: 87.1 ml LV vol d, MOD A4C: 92.2 ml LV vol s, MOD A2C: 75.6 ml LV vol s, MOD A4C: 77.8 ml LV SV MOD A2C:     11.5 ml LV SV MOD A4C:     92.2 ml LV SV MOD BP:      9.1 ml RIGHT VENTRICLE RV Basal diam:  2.90 cm RV S prime:     7.72 cm/s TAPSE (M-mode): 3.0 cm LEFT ATRIUM             Index       RIGHT ATRIUM          Index LA diam:        3.50 cm 2.59 cm/m  RA Area:     8.31 cm LA Vol (A2C):   41.2 ml 30.50 ml/m RA Volume:   15.50 ml 11.47 ml/m LA Vol (A4C):   15.4 ml 11.40 ml/m LA Biplane Vol: 26.8 ml 19.84 ml/m  AORTIC VALVE                   PULMONIC VALVE AV Area (Vmax):    3.24 cm    PV Vmax:        0.41 m/s AV Area (Vmean):   3.14 cm    PV Peak grad:   0.7 mmHg AV  Area (VTI):     3.41 cm    RVOT Peak grad: 1 mmHg AV Vmax:           80.30 cm/s AV Vmean:          55.200 cm/s AV VTI:            0.136 m AV Peak Grad:      2.6 mmHg AV Mean Grad:      1.0 mmHg LVOT Vmax:         75.10 cm/s LVOT Vmean:        50.000 cm/s LVOT VTI:          0.134 m LVOT/AV VTI ratio: 0.99  AORTA Ao Root diam: 3.30 cm MITRAL VALVE               TRICUSPID VALVE MV Area (PHT): 3.83 cm    TR Peak grad:   34.8 mmHg MV Decel Time: 198 msec    TR Vmax:        295.00 cm/s MV E velocity: 65.10 cm/s MV A velocity: 42.40 cm/s  SHUNTS MV E/A ratio:  1.54        Systemic VTI:  0.13 m                            Systemic Diam: 2.10 cm Serafina Royals MD Electronically signed by Serafina Royals MD Signature Date/Time: 07/03/2021/1:16:59 PM    Final      Assessment and Recommendation  60 y.o. 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  Continue medication management for probable non-ST elevation myocardial infarction and/or Takotsubo cardiomyopathy including aspirin 81 mg and heparin for 48 hours 2.  Slow introduction of medication management for above if patient can tolerate from the blood pressure and heart rate standpoint.  Currently unable to introduce beta-blocker and ACE inhibitor due to concerns of hypotension. 3.  Further supportive care for obtundation and/or neurologic abnormality 4.  No further cardiac diagnostics necessary at this time 5. Dr. Saralyn Pilar to follow, he will be on call this weekend  Signed, Jettie Booze PA-C

## 2021-07-04 NOTE — Progress Notes (Signed)
Pt became increasingly agitated this morning '2mg'$  of haldol was given with no change, a breathing treatment was provided with no avail as pt complained of SOB and chest tightness. Pt's O2 remained >90% throughout. She is still alert and oriented x3, Precedex was restarted at 0450.

## 2021-07-05 ENCOUNTER — Encounter
Admission: EM | Disposition: A | Discharge status: Home / Self Care | Payer: Self-pay | Source: Home / Self Care | Attending: Internal Medicine

## 2021-07-05 ENCOUNTER — Other Ambulatory Visit: Payer: Self-pay

## 2021-07-05 DIAGNOSIS — G9341 Metabolic encephalopathy: Secondary | ICD-10-CM | POA: Diagnosis not present

## 2021-07-05 DIAGNOSIS — R112 Nausea with vomiting, unspecified: Secondary | ICD-10-CM | POA: Diagnosis not present

## 2021-07-05 DIAGNOSIS — I959 Hypotension, unspecified: Secondary | ICD-10-CM | POA: Diagnosis not present

## 2021-07-05 DIAGNOSIS — F4323 Adjustment disorder with mixed anxiety and depressed mood: Secondary | ICD-10-CM

## 2021-07-05 DIAGNOSIS — I4891 Unspecified atrial fibrillation: Secondary | ICD-10-CM | POA: Diagnosis not present

## 2021-07-05 DIAGNOSIS — F411 Generalized anxiety disorder: Secondary | ICD-10-CM

## 2021-07-05 DIAGNOSIS — R197 Diarrhea, unspecified: Secondary | ICD-10-CM | POA: Diagnosis not present

## 2021-07-05 DIAGNOSIS — F1021 Alcohol dependence, in remission: Secondary | ICD-10-CM

## 2021-07-05 DIAGNOSIS — J690 Pneumonitis due to inhalation of food and vomit: Secondary | ICD-10-CM

## 2021-07-05 DIAGNOSIS — Z7289 Other problems related to lifestyle: Secondary | ICD-10-CM | POA: Diagnosis not present

## 2021-07-05 DIAGNOSIS — F1010 Alcohol abuse, uncomplicated: Secondary | ICD-10-CM

## 2021-07-05 HISTORY — DX: Adjustment disorder with mixed anxiety and depressed mood: F43.23

## 2021-07-05 HISTORY — PX: LEFT HEART CATH AND CORONARY ANGIOGRAPHY: CATH118249

## 2021-07-05 LAB — CBC
HCT: 29.1 % — ABNORMAL LOW (ref 36.0–46.0)
Hemoglobin: 9.9 g/dL — ABNORMAL LOW (ref 12.0–15.0)
MCH: 32.7 pg (ref 26.0–34.0)
MCHC: 34 g/dL (ref 30.0–36.0)
MCV: 96 fL (ref 80.0–100.0)
Platelets: 246 10*3/uL (ref 150–400)
RBC: 3.03 MIL/uL — ABNORMAL LOW (ref 3.87–5.11)
RDW: 13.5 % (ref 11.5–15.5)
WBC: 9.9 10*3/uL (ref 4.0–10.5)
nRBC: 0 % (ref 0.0–0.2)

## 2021-07-05 LAB — COMPREHENSIVE METABOLIC PANEL
ALT: 36 U/L (ref 0–44)
AST: 30 U/L (ref 15–41)
Albumin: 2.1 g/dL — ABNORMAL LOW (ref 3.5–5.0)
Alkaline Phosphatase: 40 U/L (ref 38–126)
Anion gap: 6 (ref 5–15)
BUN: 7 mg/dL (ref 6–20)
CO2: 28 mmol/L (ref 22–32)
Calcium: 7 mg/dL — ABNORMAL LOW (ref 8.9–10.3)
Chloride: 102 mmol/L (ref 98–111)
Creatinine, Ser: 0.68 mg/dL (ref 0.44–1.00)
GFR, Estimated: >60 mL/min (ref >60)
Glucose, Bld: 107 mg/dL — ABNORMAL HIGH (ref 70–99)
Potassium: 3.3 mmol/L — ABNORMAL LOW (ref 3.5–5.1)
Sodium: 136 mmol/L (ref 135–145)
Total Bilirubin: 0.5 mg/dL (ref 0.3–1.2)
Total Protein: 4.5 g/dL — ABNORMAL LOW (ref 6.5–8.1)

## 2021-07-05 LAB — PHOSPHORUS: Phosphorus: <1 mg/dL — CL (ref 2.5–4.6)

## 2021-07-05 LAB — PROCALCITONIN: Procalcitonin: 0.11 ng/mL

## 2021-07-05 LAB — HEPARIN LEVEL (UNFRACTIONATED): Heparin Unfractionated: 0.3 IU/mL (ref 0.30–0.70)

## 2021-07-05 LAB — MAGNESIUM: Magnesium: 1.9 mg/dL (ref 1.7–2.4)

## 2021-07-05 SURGERY — LEFT HEART CATH AND CORONARY ANGIOGRAPHY
Anesthesia: Moderate Sedation

## 2021-07-05 MED ORDER — SODIUM CHLORIDE 0.9% FLUSH
3.0000 mL | Freq: Two times a day (BID) | INTRAVENOUS | Status: DC
  Administered 2021-07-05 – 2021-07-09 (×7): 3 mL via INTRAVENOUS

## 2021-07-05 MED ORDER — TRAZODONE HCL 50 MG PO TABS: 150.0000 mg | ORAL_TABLET | Freq: Every day | ORAL | Status: DC

## 2021-07-05 MED ORDER — SODIUM CHLORIDE 0.9 % IV SOLN: 250.0000 mL | INTRAVENOUS | Status: DC | PRN

## 2021-07-05 MED ORDER — IOHEXOL 350 MG/ML SOLN
INTRAVENOUS | Status: DC | PRN
  Administered 2021-07-05: 28 mL

## 2021-07-05 MED ORDER — TRAZODONE HCL 50 MG PO TABS
50.0000 mg | ORAL_TABLET | Freq: Every day | ORAL | Status: DC
  Administered 2021-07-05 – 2021-07-08 (×4): 50 mg via ORAL
  Filled 2021-07-05 (×4): qty 1

## 2021-07-05 MED ORDER — SODIUM CHLORIDE 0.9% FLUSH
3.0000 mL | INTRAVENOUS | Status: DC | PRN
  Administered 2021-07-05: 3 mL via INTRAVENOUS

## 2021-07-05 MED ORDER — SODIUM CHLORIDE 0.9% FLUSH
3.0000 mL | Freq: Two times a day (BID) | INTRAVENOUS | Status: DC
  Administered 2021-07-05 – 2021-07-09 (×9): 3 mL via INTRAVENOUS

## 2021-07-05 MED ORDER — HEPARIN (PORCINE) IN NACL 1000-0.9 UT/500ML-% IV SOLN
INTRAVENOUS | Status: AC
  Filled 2021-07-05: qty 1000

## 2021-07-05 MED ORDER — ASPIRIN 81 MG PO CHEW: 81.0000 mg | CHEWABLE_TABLET | ORAL | Status: DC

## 2021-07-05 MED ORDER — CHLORDIAZEPOXIDE HCL 25 MG PO CAPS
25.0000 mg | ORAL_CAPSULE | Freq: Once | ORAL | Status: AC
  Administered 2021-07-05: 25 mg via ORAL
  Filled 2021-07-05: qty 1

## 2021-07-05 MED ORDER — SODIUM CHLORIDE 0.9 % WEIGHT BASED INFUSION: 1.0000 mL/kg/h | INTRAVENOUS | Status: DC

## 2021-07-05 MED ORDER — ALUM & MAG HYDROXIDE-SIMETH 200-200-20 MG/5ML PO SUSP
ORAL | Status: AC
  Administered 2021-07-05: 30 mL via ORAL
  Filled 2021-07-05: qty 30

## 2021-07-05 MED ORDER — HEPARIN (PORCINE) IN NACL 1000-0.9 UT/500ML-% IV SOLN
INTRAVENOUS | Status: DC | PRN
  Administered 2021-07-05 (×2): 500 mL

## 2021-07-05 MED ORDER — LIDOCAINE HCL 1 % IJ SOLN
INTRAMUSCULAR | Status: AC
  Filled 2021-07-05: qty 20

## 2021-07-05 MED ORDER — ASPIRIN 81 MG PO CHEW
324.0000 mg | CHEWABLE_TABLET | Freq: Once | ORAL | Status: AC
  Administered 2021-07-05: 324 mg via ORAL
  Filled 2021-07-05: qty 4

## 2021-07-05 MED ORDER — POTASSIUM CHLORIDE CRYS ER 20 MEQ PO TBCR
40.0000 meq | EXTENDED_RELEASE_TABLET | Freq: Once | ORAL | Status: AC
  Administered 2021-07-05: 40 meq via ORAL
  Filled 2021-07-05: qty 2

## 2021-07-05 MED ORDER — FENTANYL CITRATE (PF) 100 MCG/2ML IJ SOLN
INTRAMUSCULAR | Status: DC | PRN
  Administered 2021-07-05: 25 ug via INTRAVENOUS

## 2021-07-05 MED ORDER — ALUM & MAG HYDROXIDE-SIMETH 200-200-20 MG/5ML PO SUSP: 30.0000 mL | Freq: Once | ORAL | Status: AC

## 2021-07-05 MED ORDER — HEPARIN SODIUM (PORCINE) 1000 UNIT/ML IJ SOLN
INTRAMUSCULAR | Status: DC | PRN
  Administered 2021-07-05: 2000 [IU] via INTRAVENOUS

## 2021-07-05 MED ORDER — SODIUM CHLORIDE 0.9 % WEIGHT BASED INFUSION: 3.0000 mL/kg/h | INTRAVENOUS | Status: AC

## 2021-07-05 MED ORDER — SODIUM CHLORIDE 0.9% FLUSH: 3.0000 mL | INTRAVENOUS | Status: DC | PRN

## 2021-07-05 MED ORDER — FENTANYL CITRATE PF 50 MCG/ML IJ SOSY
PREFILLED_SYRINGE | INTRAMUSCULAR | Status: AC
  Filled 2021-07-05: qty 1

## 2021-07-05 MED ORDER — MIDAZOLAM HCL 2 MG/2ML IJ SOLN
INTRAMUSCULAR | Status: AC
  Filled 2021-07-05: qty 2

## 2021-07-05 MED ORDER — HEPARIN SODIUM (PORCINE) 1000 UNIT/ML IJ SOLN
INTRAMUSCULAR | Status: AC
  Filled 2021-07-05: qty 1

## 2021-07-05 MED ORDER — POTASSIUM PHOSPHATES 15 MMOLE/5ML IV SOLN
30.0000 mmol | Freq: Once | INTRAVENOUS | Status: AC
  Administered 2021-07-05: 30 mmol via INTRAVENOUS
  Filled 2021-07-05: qty 10

## 2021-07-05 MED ORDER — VERAPAMIL HCL 2.5 MG/ML IV SOLN
INTRAVENOUS | Status: AC
  Filled 2021-07-05: qty 2

## 2021-07-05 MED ORDER — LIDOCAINE HCL (PF) 1 % IJ SOLN
INTRAMUSCULAR | Status: DC | PRN
  Administered 2021-07-05: 2 mL

## 2021-07-05 MED ORDER — VERAPAMIL HCL 2.5 MG/ML IV SOLN
INTRAVENOUS | Status: DC | PRN
  Administered 2021-07-05: 2.5 mg via INTRAVENOUS

## 2021-07-05 SURGICAL SUPPLY — 11 items

## 2021-07-05 NOTE — Progress Notes (Signed)
[facility] Cardiology  SUBJECTIVE: Patient laying in bed, complaining of chest tightness across precordium   Vitals:   07/05/21 0500 07/05/21 0600 07/05/21 0605 07/05/21 0606  BP:   99/64   Pulse: 85 85  94  Resp: (!) 23 (!) 21 (!) 25 16  Temp:      TempSrc:      SpO2: 100% 100%  100%  Weight:      Height:         Intake/Output Summary (Last 24 hours) at 07/05/2021 [id number] Last data filed at 07/05/2021 0746 Gross per 24 hour  Intake 2068.08 ml  Output 300 ml  Net 1768.08 ml      PHYSICAL EXAM  General: Well developed, well nourished, in no acute distress HEENT:  Normocephalic and atramatic Neck:  No JVD.  Lungs: Clear bilaterally to auscultation and percussion. Heart: HRRR . Normal S1 and S2 without gallops or murmurs.  Abdomen: Bowel sounds are positive, abdomen soft and non-tender  Msk:  Back normal, normal gait. Normal strength and tone for age. Extremities: No clubbing, cyanosis or edema.   Neuro: Alert and oriented X 3. Psych:  Good affect, responds appropriately   LABS: Basic Metabolic Panel: Recent Labs    07/02/21 0906 07/03/21 0518 07/04/21 0524 07/05/21 0604  NA  --    < > 136 136  K  --    < > 3.6 3.3*  CL  --    < > 100 102  CO2  --    < > 27 28  GLUCOSE  --    < > 84 107*  BUN  --    < > 16 7  CREATININE  --    < > 0.69 0.68  CALCIUM  --    < > 7.6* 7.0*  MG 1.8  --  1.5*  --    < > = values in this interval not displayed.   Liver Function Tests: Recent Labs    07/04/21 0524 07/05/21 0604  AST 37 30  ALT 37 36  ALKPHOS 46 40  BILITOT 0.7 0.5  PROT 5.0* 4.5*  ALBUMIN 2.6* 2.1*   No results for input(s): LIPASE, AMYLASE in the last 72 hours. CBC: Recent Labs    07/04/21 0524 07/05/21 0604  WBC 13.5* 9.9  HGB 10.5* 9.9*  HCT 29.8* 29.1*  MCV 98.3 96.0  PLT 243 246   Cardiac Enzymes: No results for input(s): CKTOTAL, CKMB, CKMBINDEX, TROPONINI in the last 72 hours. BNP: Invalid input(s): POCBNP D-Dimer: No results for input(s):  DDIMER in the last 72 hours. Hemoglobin A1C: No results for input(s): HGBA1C in the last 72 hours. Fasting Lipid Panel: No results for input(s): CHOL, HDL, LDLCALC, TRIG, CHOLHDL, LDLDIRECT in the last 72 hours. Thyroid Function Tests: Recent Labs    07/02/21 1103  TSH 0.751   Anemia Panel: No results for input(s): VITAMINB12, FOLATE, FERRITIN, TIBC, IRON, RETICCTPCT in the last 72 hours.  ECHOCARDIOGRAM COMPLETE  Result Date: 07/03/2021    ECHOCARDIOGRAM REPORT   Patient Name:   Carla Martinez Date of Exam: 07/03/2021 Medical Rec #:  [id number]            Height:       63.0 in Accession #:    [id number]           Weight:       86.0 lb Date of Birth:  10-06-1961            BSA:  1.351 m Patient Age:    60 years             BP:           94/63 mmHg Patient Gender: F                    HR:           78 bpm. Exam Location:  [facility] Procedure: 2D Echo, Cardiac Doppler and Color Doppler Indications:     Dyspnea R06.00                  Elevated troponin  History:         Patient has no prior history of Echocardiogram examinations.                  Arrythmias:Atrial Fibrillation. Anxiety, depression.  Sonographer:     Sherrie Sport Referring Phys:  [id number] AMY N COX Diagnosing Phys: Serafina Royals MD  Sonographer Comments: Suboptimal apical window. IMPRESSIONS  1. Left ventricular ejection fraction, by estimation, is 25 to 30%. The left ventricle has severely decreased function. The left ventricle demonstrates regional wall motion abnormalities (see scoring diagram/findings for description). The left ventricular internal cavity size was mildly to moderately dilated. Left ventricular diastolic parameters were normal.  2. Right ventricular systolic function is normal. The right ventricular size is normal.  3. Left atrial size was mildly dilated.  4. The mitral valve is normal in structure. Mild mitral valve regurgitation.  5. The aortic valve is normal in structure. Aortic valve regurgitation is  trivial. FINDINGS  Left Ventricle: Left ventricular ejection fraction, by estimation, is 25 to 30%. The left ventricle has severely decreased function. The left ventricle demonstrates regional wall motion abnormalities. The left ventricular internal cavity size was mildly  to moderately dilated. There is no left ventricular hypertrophy. Left ventricular diastolic parameters were normal.  LV Wall Scoring: The entire apex, mid anterolateral segment, and mid inferoseptal segment are akinetic. The mid anteroseptal segment, mid inferolateral segment, mid anterior segment, and mid inferior segment are hypokinetic. The basal anteroseptal segment, basal inferolateral segment, basal anterolateral segment, basal anterior segment, basal inferior segment, and basal inferoseptal segment are normal. Right Ventricle: The right ventricular size is normal. No increase in right ventricular wall thickness. Right ventricular systolic function is normal. Left Atrium: Left atrial size was mildly dilated. Right Atrium: Right atrial size was normal in size. Pericardium: There is no evidence of pericardial effusion. Mitral Valve: The mitral valve is normal in structure. Mild mitral valve regurgitation. Tricuspid Valve: The tricuspid valve is normal in structure. Tricuspid valve regurgitation is trivial. Aortic Valve: The aortic valve is normal in structure. Aortic valve regurgitation is trivial. Aortic valve mean gradient measures 1.0 mmHg. Aortic valve peak gradient measures 2.6 mmHg. Aortic valve area, by VTI measures 3.41 cm. Pulmonic Valve: The pulmonic valve was normal in structure. Pulmonic valve regurgitation is not visualized. Aorta: The aortic root and ascending aorta are structurally normal, with no evidence of dilitation. IAS/Shunts: No atrial level shunt detected by color flow Doppler.  LEFT VENTRICLE PLAX 2D LVIDd:         4.10 cm     Diastology LVIDs:         4.00 cm     LV e' medial:    5.66 cm/s LV PW:         0.90 cm      LV E/e' medial:  11.5 LV IVS:  0.70 cm     LV e' lateral:   4.57 cm/s LVOT diam:     2.10 cm     LV E/e' lateral: 14.2 LV SV:         46 LV SV Index:   34 LVOT Area:     3.46 cm  LV Volumes (MOD) LV vol d, MOD A2C: 87.1 ml LV vol d, MOD A4C: 92.2 ml LV vol s, MOD A2C: 75.6 ml LV vol s, MOD A4C: 77.8 ml LV SV MOD A2C:     11.5 ml LV SV MOD A4C:     92.2 ml LV SV MOD BP:      9.1 ml RIGHT VENTRICLE RV Basal diam:  2.90 cm RV S prime:     7.72 cm/s TAPSE (M-mode): 3.0 cm LEFT ATRIUM             Index       RIGHT ATRIUM          Index LA diam:        3.50 cm 2.59 cm/m  RA Area:     8.31 cm LA Vol (A2C):   41.2 ml 30.50 ml/m RA Volume:   15.50 ml 11.47 ml/m LA Vol (A4C):   15.4 ml 11.40 ml/m LA Biplane Vol: 26.8 ml 19.84 ml/m  AORTIC VALVE                   PULMONIC VALVE AV Area (Vmax):    3.24 cm    PV Vmax:        0.41 m/s AV Area (Vmean):   3.14 cm    PV Peak grad:   0.7 mmHg AV Area (VTI):     3.41 cm    RVOT Peak grad: 1 mmHg AV Vmax:           80.30 cm/s AV Vmean:          55.200 cm/s AV VTI:            0.136 m AV Peak Grad:      2.6 mmHg AV Mean Grad:      1.0 mmHg LVOT Vmax:         75.10 cm/s LVOT Vmean:        50.000 cm/s LVOT VTI:          0.134 m LVOT/AV VTI ratio: 0.99  AORTA Ao Root diam: 3.30 cm MITRAL VALVE               TRICUSPID VALVE MV Area (PHT): 3.83 cm    TR Peak grad:   34.8 mmHg MV Decel Time: 198 msec    TR Vmax:        295.00 cm/s MV E velocity: 65.10 cm/s MV A velocity: 42.40 cm/s  SHUNTS MV E/A ratio:  1.54        Systemic VTI:  0.13 m                            Systemic Diam: 2.10 cm Serafina Royals MD Electronically signed by Serafina Royals MD Signature Date/Time: 07/03/2021/1:16:59 PM    Final      Echo LVEF 25 to 30% with regional wall motion abnormalities  TELEMETRY: [place] rhythm 90 bpm:  ASSESSMENT AND PLAN:  Principal Problem:   Atrial fibrillation with RVR (HCC) Active Problems:   Mild recurrent major depression (HCC)   Acid reflux   Osteoporosis    History of bowel  resection   Rectal prolapse   Diarrhea   Enteritis   Anorexia   Alcohol use   Anxiety   Insomnia   Coffee ground emesis   Complicated grief   Grief at loss of child   Hypotension, chronic   Pressure injury of skin    1.  Non-ST elevation myocardial infarction versus Takotsubo's cardiomyopathy, with elevated troponin [id number] ), ongoing chest pain with nonspecific ST-T wave abnormalities on ECG.  Of note, patient reports her daughter passed away 2021-03-25. 2.  Paroxysmal atrial fibrillation upon presentation, converted to sinus rhythm, remains in sinus rhythm 3.  Acute metabolic encephalopathy felt to be due to possible drug and alcohol withdrawal, improved, conversing appropriately today 4.  Borderline hypotension, unable to initiate therapy with a beta-blocker or ACE inhibitor at this time  Recommendations  1.  Agree with current therapy 2.  Cardiac catheterization with selective coronary arteriography.  Discussed the risk, benefits and alternatives of cardiac catheterization and possible PCI and informed written consent was obtained. 3.  Initiate therapy with beta-blocker and ACE inhibitor following cardiac catheterization as blood pressure permits     Isaias Cowman, MD, PhD, [facility] 07/05/2021 8:21 AM

## 2021-07-05 NOTE — Progress Notes (Signed)
Carla Martinez for IV heparin Indication: chest pain/ACS/STEMI  Patient Measurements: Heparin Dosing Weight: 39 kg  Labs: Recent Labs    07/02/21 0644 07/02/21 1253 07/02/21 1304 07/02/21 1730 07/02/21 1846 07/03/21 0518 07/03/21 1450 07/04/21 0524 07/04/21 1238 07/04/21 2123 07/05/21 0604  HGB 13.0  --   --   --   --  10.3*  --  10.5*  --   --  9.9*  HCT 37.1  --   --   --   --  29.4*  --  29.8*  --   --  29.1*  PLT 428*  --   --   --   --  252  --  243  --   --  246  APTT  --   --  >200*  --   --   --   --   --   --   --   --   LABPROT  --   --  14.2  --   --   --   --   --   --   --   --   INR  --   --  1.1  --   --   --   --   --   --   --   --   HEPARINUNFRC  --   --   --   --    < > <0.10*   < > 0.37 0.20* 0.28* 0.30  CREATININE 1.15*  --   --   --   --  0.77  --  0.69  --   --   --   TROPONINIHS 690* 1,135*  --  1,119*  --   --   --   --   --   --   --    < > = values in this interval not displayed.     Estimated Creatinine Clearance: 47.8 mL/min (by C-G formula based on SCr of 0.69 mg/dL).   Medical History: Past Medical History:  Diagnosis Date   Anxiety    Depression     Medications No anticoagulation prior to admission per my chart review  Assessment: [name] with PMH of anxiety, depression, and tobacco use presenting with nausea and vomiting. While in ED, found to have elevated troponins. No anticoagulation PTA. ECG resulted "minimal ST elevation, lateral leads".   0913 1846 HL < 0.10; subthera, 450 un/hr 0914  0518 HL < 0.10; subthera, 550 un/hr 0914 1450 HL     0.11; subthera, 700 un/hr 0914  2144 HL    0.18; subthera, 850 un/hr 0915  0524 HL    0.37; thera, 1000 un/hr 0915  1238 HL    0.20, subthera; 1000 un/hr 0915   2123 HL   0.28, subthera 1050> 1150 units/hr 0916 0604 HL 0.30, therapeutic x1  Goal of Therapy:  Heparin level 0.3-0.7 units/ml Monitor platelets by anticoagulation protocol: Yes   Plan:   --Heparin level is therapeutic x 1 --Continue heparin infusion rate at 1150 units/hr --Re-check HL in 6 hours to confirm --CBC daily per protocol   Carla Martinez, PharmD, BCPS Clinical Pharmacist   07/05/2021 6:42 AM

## 2021-07-05 NOTE — Consult Note (Signed)
[facility] Face-to-Face Psychiatry Consult   Reason for Consult: Consult for 60 year old woman came into the hospital with gastric distress subsequently found to have cardiac problems.  Anxiety and depression. Referring Physician: [place] Pink Patient Identification: Uarda Cobos MRN:  [id number] Principal Diagnosis: Adjustment disorder with mixed anxiety and depressed mood Diagnosis:  Principal Problem:   Adjustment disorder with mixed anxiety and depressed mood Active Problems:   Mild recurrent major depression (HCC)   Acid reflux   Osteoporosis   History of bowel resection   Rectal prolapse   Diarrhea   Enteritis   Anorexia   Alcohol use   Anxiety   Insomnia   Coffee ground emesis   Atrial fibrillation with RVR ([place])   Complicated grief   Grief at loss of child   Hypotension, chronic   Pressure injury of skin   Alcohol abuse   Generalized anxiety disorder   Total Time spent with patient: 1 hour  Subjective:   Staisha Vicary is a 60 y.o. female patient admitted with "I was supposed to get a colonoscopy".  HPI: Patient seen chart reviewed.  60 year old woman was supposed to be getting a colonoscopy but was unable to complete the prep.  Came to the hospital with severe GI discomfort and nausea and vomiting.  Subsequently found to have had probably a non-ST cardiac event.  Currently still recovering in the ICU.  Patient is complaining of chronic anxiety.  Her oldest daughter passed away in 03-09-2023 of this year.  Since then patient has been overwhelmed by anxiety.  In addition to this she has the chronic severe stress of taking care of her disabled son who lives at home.  She has been unable to do all of this and has had to rely on her elderly mother.  She has been eating less than usual and says she has lost about 10 pounds since 2023-03-09.  Sleeps only with her medication.  She has been using NyQuil a bottle a day and also using delta 8 Gummies to sleep at night in addition to her usual  medication.  Patient was taking Seroquel 100 mg 3 times a day plus trazodone 300 mg at night and 50 mg twice a day for anxiety.  She denies any suicidal intent or plan although she says sometimes she has passive thoughts of suicide.  Denies homicidal ideation.  Patient has a history of alcohol abuse but says she has not been drinking recently although she backtracks on that at 1 point and admits she has been drinking half bottles of wine.  Past Psychiatric History: Patient has a history of alcohol abuse.  Says she struggled with alcohol abuse especially in the 2000's.  Last inpatient treatment she estimates around 2010.  Denies past suicide attempts.  She does say that she has been on other medications in the past but could not remember what they were until I started suggesting some of them.  Patient has had previous hospitalizations and treatment in other areas of the [place].  Not currently seeing anyone except her primary care doctor  Risk to Self:   Risk to Others:   Prior Inpatient Therapy:   Prior Outpatient Therapy:    Past Medical History:  Past Medical History:  Diagnosis Date   Anxiety    Depression     Past Surgical History:  Procedure Laterality Date   CHOLECYSTECTOMY     COLON RESECTION     12" removed   MENISCUS REPAIR Bilateral    Family History:  Family History  Problem Relation Age of Onset   Leukemia Sister    Diabetes type I Child    Heart disease Sister    Hypertension Mother    Heart disease Mother    Stroke Mother    Down syndrome Son    Stroke Son    Family Psychiatric  History: She has a mentally impaired son. Social History:  Social History   Substance and Sexual Activity  Alcohol Use Yes   Comment: 3-4 glasses a night     Social History   Substance and Sexual Activity  Drug Use Never    Social History   Socioeconomic History   Marital status: Single    Spouse name: Not on file   Number of children: 6   Years of education: some college    Highest education level: Not on file  Occupational History   Not on file  Tobacco Use   Smoking status: Every Day    Packs/day: 0.25    Years: 17.00    Pack years: 4.25    Types: Cigarettes    Start date: 11/25/2003   Smokeless tobacco: Never   Tobacco comments:    used to smoke 1PPD but has decreased. Down to 1-2 cigarettes daily  Vaping Use   Vaping Use: Never used  Substance and Sexual Activity   Alcohol use: Yes    Comment: 3-4 glasses a night   Drug use: Never   Sexual activity: Not Currently  Other Topics Concern   Not on file  Social History Narrative   02/14/21   From: moved to [facility] 2020 to be near family   Living: with mom and son who is dependent adult son with Down syndrome   Work: care giver      Family: 6 children - [name] lives with her, Surveyor, minerals (2002), - 4 living grandchildren      Enjoys: Training and development officer, Medical laboratory scientific officer      Exercise: use to exercise non-stop   Diet: good, today ate 2 hard boiled eggs, 1 meal and few snacks      Safety   Seat belts: Yes    Guns: No   Safe in relationships: Yes    Social Determinants of Radio broadcast assistant Strain: Not on file  Food Insecurity: Not on file  Transportation Needs: Not on file  Physical Activity: Not on file  Stress: Not on file  Social Connections: Not on file   Additional Social History:    Allergies:  No Known Allergies  Labs:  Results for orders placed or performed during the hospital encounter of 07/02/21 (from the past 48 hour(s))  Heparin level (unfractionated)     Status: Abnormal   Collection Time: 07/03/21  9:44 PM  Result Value Ref Range   Heparin Unfractionated 0.18 (L) 0.30 - 0.70 IU/mL    Comment: (NOTE) The clinical reportable range upper limit is being lowered to >1.10 to align with the FDA approved guidance for the current laboratory assay.  If heparin results are below expected values, and patient dosage has  been confirmed, suggest follow up testing of antithrombin III  levels. Performed at [facility], [address] [place], [place] [address]   CBC     Status: Abnormal   Collection Time: 07/04/21  5:24 AM  Result Value Ref Range   WBC 13.5 (H) 4.0 - 10.5 K/uL   RBC 3.03 (L) 3.87 - 5.11 MIL/uL   Hemoglobin 10.5 (L) 12.0 - 15.0 g/dL   HCT  29.8 (L) 36.0 - 46.0 %   MCV 98.3 80.0 - 100.0 fL   MCH 34.7 (H) 26.0 - 34.0 pg   MCHC 35.2 30.0 - 36.0 g/dL   RDW 13.4 11.5 - 15.5 %   Platelets 243 150 - 400 K/uL   nRBC 0.0 0.0 - 0.2 %    Comment: Performed at [facility], [address] [place], [place] [address]  Comprehensive metabolic panel     Status: Abnormal   Collection Time: 07/04/21  5:24 AM  Result Value Ref Range   Sodium 136 135 - 145 mmol/L   Potassium 3.6 3.5 - 5.1 mmol/L   Chloride 100 98 - 111 mmol/L   CO2 27 22 - 32 mmol/L   Glucose, Bld 84 70 - 99 mg/dL    Comment: Glucose reference range applies only to samples taken after fasting for at least 8 hours.   BUN 16 6 - 20 mg/dL   Creatinine, Ser 0.69 0.44 - 1.00 mg/dL   Calcium 7.6 (L) 8.9 - 10.3 mg/dL   Total Protein 5.0 (L) 6.5 - 8.1 g/dL   Albumin 2.6 (L) 3.5 - 5.0 g/dL   AST 37 15 - 41 U/L   ALT 37 0 - 44 U/L   Alkaline Phosphatase 46 38 - 126 U/L   Total Bilirubin 0.7 0.3 - 1.2 mg/dL   GFR, Estimated >60 >60 mL/min    Comment: (NOTE) Calculated using the CKD-EPI Creatinine Equation (2021)    Anion gap 9 5 - 15    Comment: Performed at [facility], [address], [place], [place] [address]  Magnesium     Status: Abnormal   Collection Time: 07/04/21  5:24 AM  Result Value Ref Range   Magnesium 1.5 (L) 1.7 - 2.4 mg/dL    Comment: Performed at [facility], [address] [place], [place] [address]  Procalcitonin     Status: None   Collection Time: 07/04/21  5:24 AM  Result Value Ref Range   Procalcitonin 0.27 ng/mL    Comment:        Interpretation: PCT (Procalcitonin) <= 0.5 ng/mL: Systemic infection (sepsis) is not  likely. Local bacterial infection is possible. (NOTE)       Sepsis PCT Algorithm           Lower Respiratory Tract                                      Infection PCT Algorithm    ----------------------------     ----------------------------         PCT < 0.25 ng/mL                PCT < 0.10 ng/mL          Strongly encourage             Strongly discourage   discontinuation of antibiotics    initiation of antibiotics    ----------------------------     -----------------------------       PCT 0.25 - 0.50 ng/mL            PCT 0.10 - 0.25 ng/mL               OR       >80% decrease in PCT            Discourage initiation of  antibiotics      Encourage discontinuation           of antibiotics    ----------------------------     -----------------------------         PCT >= 0.50 ng/mL              PCT 0.26 - 0.50 ng/mL               AND        <80% decrease in PCT             Encourage initiation of                                             antibiotics       Encourage continuation           of antibiotics    ----------------------------     -----------------------------        PCT >= 0.50 ng/mL                  PCT > 0.50 ng/mL               AND         increase in PCT                  Strongly encourage                                      initiation of antibiotics    Strongly encourage escalation           of antibiotics                                     -----------------------------                                           PCT <= 0.25 ng/mL                                                 OR                                        > 80% decrease in PCT                                      Discontinue / Do not initiate                                             antibiotics  Performed at [facility], [address] [place], [place] [address]   Lactic acid, plasma     Status: Abnormal   Collection Time:  07/04/21  5:24 AM   Result Value Ref Range   Lactic Acid, Venous 2.0 (HH) 0.5 - 1.9 mmol/L    Comment: CRITICAL RESULT CALLED TO, READ BACK BY AND VERIFIED WITH Belinda Block RN [phone] 07/04/21 HNM Performed at [facility], [place], [place] [address]   Heparin level (unfractionated)     Status: None   Collection Time: 07/04/21  5:24 AM  Result Value Ref Range   Heparin Unfractionated 0.37 0.30 - 0.70 IU/mL    Comment: (NOTE) The clinical reportable range upper limit is being lowered to >1.10 to align with the FDA approved guidance for the current laboratory assay.  If heparin results are below expected values, and patient dosage has  been confirmed, suggest follow up testing of antithrombin III levels. Performed at [facility], [address] [place], [place] [address]   Gastrointestinal Panel by PCR , Stool     Status: None   Collection Time: 07/04/21  5:52 AM   Specimen: Stool  Result Value Ref Range   Campylobacter species NOT DETECTED NOT DETECTED   Plesimonas shigelloides NOT DETECTED NOT DETECTED   Salmonella species NOT DETECTED NOT DETECTED   Yersinia enterocolitica NOT DETECTED NOT DETECTED   Vibrio species NOT DETECTED NOT DETECTED   Vibrio cholerae NOT DETECTED NOT DETECTED   Enteroaggregative E coli (EAEC) NOT DETECTED NOT DETECTED   Enteropathogenic E coli (EPEC) NOT DETECTED NOT DETECTED   Enterotoxigenic E coli (ETEC) NOT DETECTED NOT DETECTED   Shiga like toxin producing E coli (STEC) NOT DETECTED NOT DETECTED   Shigella/Enteroinvasive E coli (EIEC) NOT DETECTED NOT DETECTED   Cryptosporidium NOT DETECTED NOT DETECTED   Cyclospora cayetanensis NOT DETECTED NOT DETECTED   Entamoeba histolytica NOT DETECTED NOT DETECTED   Giardia lamblia NOT DETECTED NOT DETECTED   Adenovirus F40/41 NOT DETECTED NOT DETECTED   Astrovirus NOT DETECTED NOT DETECTED   Norovirus GI/GII NOT DETECTED NOT DETECTED   Rotavirus A NOT DETECTED NOT DETECTED   Sapovirus  (I, II, IV, and V) NOT DETECTED NOT DETECTED    Comment: Performed at [facility], [address] [place], [place] [address]  C Difficile Quick Screen w PCR reflex     Status: None   Collection Time: 07/04/21  5:52 AM   Specimen: STOOL  Result Value Ref Range   C Diff antigen NEGATIVE NEGATIVE   C Diff toxin NEGATIVE NEGATIVE   C Diff interpretation No C. difficile detected.     Comment: Performed at [facility], [place], [place] [address]  Heparin level (unfractionated)     Status: Abnormal   Collection Time: 07/04/21 12:38 PM  Result Value Ref Range   Heparin Unfractionated 0.20 (L) 0.30 - 0.70 IU/mL    Comment: (NOTE) The clinical reportable range upper limit is being lowered to >1.10 to align with the FDA approved guidance for the current laboratory assay.  If heparin results are below expected values, and patient dosage has  been confirmed, suggest follow up testing of antithrombin III levels. Performed at [facility], [place], [place] [address]   Heparin level (unfractionated)     Status: Abnormal   Collection Time: 07/04/21  9:23 PM  Result Value Ref Range   Heparin Unfractionated 0.28 (L) 0.30 - 0.70 IU/mL    Comment: (NOTE) The clinical reportable range upper limit is being lowered to >1.10 to align with the FDA approved guidance for the current laboratory assay.  If heparin results are below expected values, and patient dosage has  been confirmed, suggest follow up testing of antithrombin III levels. Performed at [facility], [address] [place], [place] [address]   CBC     Status: Abnormal   Collection Time: 07/05/21  6:04 AM  Result Value Ref Range   WBC 9.9 4.0 - 10.5 K/uL   RBC 3.03 (L) 3.87 - 5.11 MIL/uL   Hemoglobin 9.9 (L) 12.0 - 15.0 g/dL   HCT 29.1 (L) 36.0 - 46.0 %   MCV 96.0 80.0 - 100.0 fL   MCH 32.7 26.0 - 34.0 pg   MCHC 34.0 30.0 - 36.0 g/dL   RDW 13.5 11.5 - 15.5 %    Platelets 246 150 - 400 K/uL   nRBC 0.0 0.0 - 0.2 %    Comment: Performed at [facility], [address] [place], [place] [address]  Comprehensive metabolic panel     Status: Abnormal   Collection Time: 07/05/21  6:04 AM  Result Value Ref Range   Sodium 136 135 - 145 mmol/L   Potassium 3.3 (L) 3.5 - 5.1 mmol/L   Chloride 102 98 - 111 mmol/L   CO2 28 22 - 32 mmol/L   Glucose, Bld 107 (H) 70 - 99 mg/dL    Comment: Glucose reference range applies only to samples taken after fasting for at least 8 hours.   BUN 7 6 - 20 mg/dL   Creatinine, Ser 0.68 0.44 - 1.00 mg/dL   Calcium 7.0 (L) 8.9 - 10.3 mg/dL   Total Protein 4.5 (L) 6.5 - 8.1 g/dL   Albumin 2.1 (L) 3.5 - 5.0 g/dL   AST 30 15 - 41 U/L   ALT 36 0 - 44 U/L   Alkaline Phosphatase 40 38 - 126 U/L   Total Bilirubin 0.5 0.3 - 1.2 mg/dL   GFR, Estimated >60 >60 mL/min    Comment: (NOTE) Calculated using the CKD-EPI Creatinine Equation (2021)    Anion gap 6 5 - 15    Comment: Performed at [facility], [address], [place], [place] [address]  Procalcitonin     Status: None   Collection Time: 07/05/21  6:04 AM  Result Value Ref Range   Procalcitonin 0.11 ng/mL    Comment:        Interpretation: PCT (Procalcitonin) <= 0.5 ng/mL: Systemic infection (sepsis) is not likely. Local bacterial infection is possible. (NOTE)       Sepsis PCT Algorithm           Lower Respiratory Tract                                      Infection PCT Algorithm    ----------------------------     ----------------------------         PCT < 0.25 ng/mL                PCT < 0.10 ng/mL          Strongly encourage             Strongly discourage   discontinuation of antibiotics    initiation of antibiotics    ----------------------------     -----------------------------       PCT 0.25 - 0.50 ng/mL            PCT 0.10 - 0.25 ng/mL  OR       >80% decrease in PCT            Discourage initiation of                                             antibiotics      Encourage discontinuation           of antibiotics    ----------------------------     -----------------------------         PCT >= 0.50 ng/mL              PCT 0.26 - 0.50 ng/mL               AND        <80% decrease in PCT             Encourage initiation of                                             antibiotics       Encourage continuation           of antibiotics    ----------------------------     -----------------------------        PCT >= 0.50 ng/mL                  PCT > 0.50 ng/mL               AND         increase in PCT                  Strongly encourage                                      initiation of antibiotics    Strongly encourage escalation           of antibiotics                                     -----------------------------                                           PCT <= 0.25 ng/mL                                                 OR                                        > 80% decrease in PCT                                      Discontinue / Do not initiate  antibiotics  Performed at [facility], [place], [place] [address]   Heparin level (unfractionated)     Status: None   Collection Time: 07/05/21  6:04 AM  Result Value Ref Range   Heparin Unfractionated 0.30 0.30 - 0.70 IU/mL    Comment: (NOTE) The clinical reportable range upper limit is being lowered to >1.10 to align with the FDA approved guidance for the current laboratory assay.  If heparin results are below expected values, and patient dosage has  been confirmed, suggest follow up testing of antithrombin III levels. Performed at [facility], [address] [place], [place] [address]   Magnesium     Status: None   Collection Time: 07/05/21  6:04 AM  Result Value Ref Range   Magnesium 1.9 1.7 - 2.4 mg/dL    Comment: Performed at [facility], [address] [place], [place] [address]  Phosphorus     Status: Abnormal   Collection Time: 07/05/21  6:04 AM  Result Value Ref Range   Phosphorus <1.0 (LL) 2.5 - 4.6 mg/dL    Comment: CRITICAL RESULT CALLED TO, READ BACK BY AND VERIFIED WITH BROOKE DARRELL AT 1352 07/05/21 DAS Performed at [facility], [address] [place], [place] [address]     Current Facility-Administered Medications  Medication Dose Route Frequency Provider Last Rate Last Admin   0.9 %  sodium chloride infusion  250 mL Intravenous PRN Andrez Grime, MD       amitriptyline (ELAVIL) tablet 25 mg  25 mg Oral QHS Bonnielee Haff, MD   25 mg at 07/04/21 2223   Ampicillin-Sulbactam (UNASYN) 3 g in sodium chloride 0.9 % 100 mL IVPB  3 g Intravenous Q6H Benita Gutter, RPH   Stopped at 07/05/21 1428   Chlorhexidine Gluconate Cloth 2 % PADS 6 each  6 each Topical Q0600 Cox, Amy N, DO   6 each at [id number]   folic acid (FOLVITE) tablet 1 mg  1 mg Oral Daily Cox, Amy N, DO   1 mg at 07/05/21 1310   haloperidol lactate (HALDOL) injection 2 mg  2 mg Intravenous Q6H PRN Bonnielee Haff, MD   2 mg at 07/04/21 0436   hydrOXYzine (ATARAX/VISTARIL) tablet 50 mg  50 mg Oral TID PRN Bonnielee Haff, MD   50 mg at 07/05/21 1312   loperamide (IMODIUM) capsule 2 mg  2 mg Oral QID PRN [name] B, MD   2 mg at 07/05/21 1613   magnesium oxide (MAG-OX) tablet 400 mg  400 mg Oral Daily Cox, Amy N, DO   400 mg at 07/05/21 1310   MEDLINE mouth rinse  15 mL Mouth Rinse BID Bonnielee Haff, MD       midodrine (PROAMATINE) tablet 10 mg  10 mg Oral TID WC Bonnielee Haff, MD   10 mg at 07/05/21 1613   morphine 2 MG/ML injection 1 mg  1 mg Intravenous Q3H PRN Bonnielee Haff, MD   1 mg at 07/04/21 2144   multivitamin with minerals tablet 1 tablet  1 tablet Oral Daily Cox, Amy N, DO   1 tablet at 07/05/21 1310   nicotine (NICODERM CQ - dosed in mg/24 hours) patch 14 mg  14 mg Transdermal Daily Bonnielee Haff, MD   14 mg at 07/05/21  1311   ondansetron (ZOFRAN) tablet 4 mg  4 mg Oral Q6H PRN Cox, Amy N, DO       Or   ondansetron (ZOFRAN) injection  4 mg  4 mg Intravenous Q6H PRN Cox, Amy N, DO       oxyCODONE (Oxy IR/ROXICODONE) immediate release tablet 5 mg  5 mg Oral Q6H PRN Bonnielee Haff, MD   5 mg at 07/05/21 1310   pantoprazole (PROTONIX) EC tablet 40 mg  40 mg Oral BID Bonnielee Haff, MD   40 mg at 07/05/21 1310   polyethylene glycol-electrolytes (NuLYTELY) solution 4,000 mL  4,000 mL Oral Once [name] B, MD       potassium PHOSPHATE 30 mmol in dextrose 5 % 500 mL infusion  30 mmol Intravenous Once Bonnielee Haff, MD       promethazine (PHENERGAN) 12.5 mg in sodium chloride 0.9 % 50 mL IVPB  12.5 mg Intravenous Q8H PRN Cox, Amy N, DO   Stopped at 07/03/21 1531   QUEtiapine (SEROQUEL) tablet 100 mg  100 mg Oral TID Bonnielee Haff, MD   100 mg at 07/05/21 1613   sodium chloride flush (NS) 0.9 % injection 3 mL  3 mL Intravenous Q12H Paraschos, Alexander, MD       sodium chloride flush (NS) 0.9 % injection 3 mL  3 mL Intravenous Q12H Andrez Grime, MD   3 mL at 07/05/21 1425   sodium chloride flush (NS) 0.9 % injection 3 mL  3 mL Intravenous PRN Andrez Grime, MD       thiamine tablet 100 mg  100 mg Oral Daily Cox, Amy N, DO   100 mg at 07/05/21 1310   Or   thiamine (B-1) injection 100 mg  100 mg Intravenous Daily Cox, Amy N, DO       traZODone (DESYREL) tablet 50 mg  50 mg Oral QHS Bonnielee Haff, MD   50 mg at 07/04/21 2223   vitamin B-12 (CYANOCOBALAMIN) tablet 1,000 mcg  1,000 mcg Oral Daily Cox, Amy N, DO   1,000 mcg at 07/05/21 1310    Musculoskeletal: Strength & Muscle Tone: decreased Gait & Station: unsteady Patient leans: N/A            Psychiatric Specialty Exam:  Presentation  General Appearance:  No data recorded Eye Contact: No data recorded Speech: No data recorded Speech Volume: No data recorded Handedness: No data recorded  Mood and Affect  Mood: No  data recorded Affect: No data recorded  Thought Process  Thought Processes: No data recorded Descriptions of Associations:No data recorded Orientation:No data recorded Thought Content:No data recorded History of Schizophrenia/Schizoaffective disorder:No data recorded Duration of Psychotic Symptoms:No data recorded Hallucinations:No data recorded Ideas of Reference:No data recorded Suicidal Thoughts:No data recorded Homicidal Thoughts:No data recorded  Sensorium  Memory: No data recorded Judgment: No data recorded Insight: No data recorded  Executive Functions  Concentration: No data recorded Attention Span: No data recorded Recall: No data recorded Fund of Knowledge: No data recorded Language: No data recorded  Psychomotor Activity  Psychomotor Activity: No data recorded  Assets  Assets: No data recorded  Sleep  Sleep: No data recorded  Physical Exam: Physical Exam Vitals and nursing note reviewed.  Constitutional:      Appearance: Normal appearance.  HENT:     Head: Normocephalic and atraumatic.     Mouth/Throat:     Pharynx: Oropharynx is clear.  Eyes:     Pupils: Pupils are equal, round, and reactive to light.  Cardiovascular:     Rate and Rhythm: Normal rate and regular rhythm.  Pulmonary:     Effort: Pulmonary effort is normal.  Breath sounds: Normal breath sounds.  Abdominal:     General: Abdomen is flat.     Palpations: Abdomen is soft.  Musculoskeletal:        General: Normal range of motion.  Skin:    General: Skin is warm and dry.  Neurological:     General: No focal deficit present.     Mental Status: She is alert. Mental status is at baseline.  Psychiatric:        Attention and Perception: Attention normal.        Mood and Affect: Mood is anxious.        Speech: Speech normal.        Behavior: Behavior is cooperative.        Thought Content: Thought content normal. Thought content does not include homicidal or suicidal  ideation.        Cognition and Memory: Memory is impaired.        Judgment: Judgment is impulsive.   Review of Systems  Constitutional: Negative.   HENT: Negative.    Eyes: Negative.   Respiratory: Negative.    Cardiovascular: Negative.   Gastrointestinal:  Positive for abdominal pain.  Musculoskeletal: Negative.   Skin: Negative.   Neurological: Negative.   Psychiatric/Behavioral:  Positive for substance abuse. Negative for hallucinations and suicidal ideas. The patient is nervous/anxious.   Blood pressure 98/61, pulse 95, temperature 98.3 F (36.8 C), temperature source Oral, resp. rate (!) 27, height '5\' 3"'$  (1.6 m), weight 39 kg, SpO2 100 %. Body mass index is 15.23 kg/m.  Treatment Plan Summary: Medication management and Plan 60 year old woman with complaints of somatic symptoms severe anxiety depressed mood but no psychosis and no suicidal ideation.  History of alcohol abuse and patient is a little bit unclear about whether or not she has been drinking recently.  Not clear to me whether the use of NyQuil was in an abusive pattern or not.  Patient's chief request boils down to having "something" that will help her with her anxiety.  A major complication is that on the most recent EKG her QT corrected is over 560.  She is already being given her Seroquel at her previous dose which is probably contributing to this.  Given her recent cardiac history I am very hesitant to do anything that would worsen this.  Patient wants to be back on her 300 mg of trazodone at night but I would prefer not to do that with her EKG.  I suggested we tried buspirone as it has little or no effect on QT interval.  Once I suggested it she remembered that she had been on it and insisted it had been of no benefit.  I have some concern that the patient may be trying to get benzodiazepines but she did not specifically ask for them.  Given the problems with the QT interval I am a little bit at a loss what to add.  I do not  think that I would increase the Seroquel.  Do not think I would add the trazodone.  Because of her history of alcohol abuse I would not use benzodiazepines.  I do note that the patient seems to be much better than she was when she first came into the hospital and is gradually improving.  I am not going to make any changes to her orders today.  We will continue to follow up regularly.  Disposition: No evidence of imminent risk to self or others at present.   Patient does not  meet criteria for psychiatric inpatient admission. Supportive therapy provided about ongoing stressors. Discussed crisis plan, support from social network, calling 911, coming to the Emergency Department, and calling Suicide Hotline.  Alethia Berthold, MD 07/05/2021 5:11 PM

## 2021-07-05 NOTE — Progress Notes (Signed)
Carla Antigua, MD [address], [place], [place], [place], [address] [address], [place], [place], [place], [address] Phone: [phone]  Fax: [phone]   Subjective: Pt underwent Cath today that did not show any obstructive disease and medical management for Carla Martinez is recommended by cardiology  Pt has has had two loose Bms today and has not received any imodium today. No emesis   Objective: Exam: Vital signs in last 24 hours: Vitals:   07/05/21 1300 07/05/21 1315 07/05/21 1354 07/05/21 1400  BP: 103/65   98/61  Pulse: 97 98 99 95  Resp: (!) 23 (!) 24 (!) 23 (!) 27  Temp:    98.3 F (36.8 C)  TempSrc:    Oral  SpO2: 100% 100% 100% 100%  Weight:      Height:       Weight change:   Intake/Output Summary (Last 24 hours) at 07/05/2021 1506 Last data filed at 07/05/2021 1443 Gross per 24 hour  Intake 809.46 ml  Output 300 ml  Net 509.46 ml    Constitutional: General:   Alert,  Well-developed, well-nourished, pleasant and cooperative in NAD BP 98/61 (BP Location: Left Leg)   Pulse 95   Temp 98.3 F (36.8 C) (Oral)   Resp (!) 27   Ht '5\' 3"'$  (1.6 m)   Wt 39 kg   SpO2 100%   BMI 15.23 kg/m   Eyes:  Sclera clear, no icterus.   Conjunctiva pink.   Ears:  No scars, lesions or masses, Normal auditory acuity. Nose:  No deformity, discharge, or lesions. Mouth:  No deformity or lesions, oropharynx pink & moist.  Neck:  Supple; no masses, trachea midline  Respiratory: Normal respiratory effort  Gastrointestinal:  Soft, non-tender and non-distended without masses, hepatosplenomegaly or hernias noted.  No guarding or rebound tenderness.     Cardiac: No clubbing or edema.  No cyanosis. Normal posterior tibial pedal pulses noted.  Lymphatic:  No significant cervical adenopathy.  Psych:  Alert and cooperative. Normal mood and affect.  Musculoskeletal:  Head normocephalic, atraumatic. 5/5 Lower extremity strength bilaterally.  Skin: Warm. Intact without  significant lesions or rashes. No jaundice.  Neurologic:  Face symmetrical, tongue midline, Normal sensation to touch  Psych:  Alert and oriented x3, Alert and cooperative. Normal mood and affect.   Lab Results: Lab Results  Component Value Date   WBC 9.9 07/05/2021   HGB 9.9 (L) 07/05/2021   HCT 29.1 (L) 07/05/2021   MCV 96.0 07/05/2021   PLT 246 07/05/2021   Micro Results: Recent Results (from the past 240 hour(s))  SARS CORONAVIRUS 2 (TAT 6-24 HRS) Nasopharyngeal Nasopharyngeal Swab     Status: None   Collection Time: 07/02/21  1:50 PM   Specimen: Nasopharyngeal Swab  Result Value Ref Range Status   SARS Coronavirus 2 NEGATIVE NEGATIVE Final    Comment: (NOTE) SARS-CoV-2 target nucleic acids are NOT DETECTED.  The SARS-CoV-2 RNA is generally detectable in upper and lower respiratory specimens during the acute phase of infection. Negative results do not preclude SARS-CoV-2 infection, do not rule out co-infections with other pathogens, and should not be used as the sole basis for treatment or other patient management decisions. Negative results must be combined with clinical observations, patient history, and epidemiological information. The expected result is Negative.  Fact Sheet for Patients: SugarRoll.be  Fact Sheet for Healthcare Providers: https://www.woods-mathews.com/  This test is not yet approved or cleared by the [place] FDA and  has been authorized for detection  and/or diagnosis of SARS-CoV-2 by FDA under an Emergency Use Authorization (EUA). This EUA will remain  in effect (meaning this test can be used) for the duration of the COVID-19 declaration under Se ction 564(b)(1) of the Act, 21 U.S.C. section 360bbb-3(b)(1), unless the authorization is terminated or revoked sooner.  Performed at [facility], [place] [address], [place], [place] [address]   MRSA Next Gen by PCR, Nasal     Status: None    Collection Time: 07/02/21  8:26 PM   Specimen: Nasal Mucosa; Nasal Swab  Result Value Ref Range Status   MRSA by PCR Next Gen NOT DETECTED NOT DETECTED Final    Comment: (NOTE) The GeneXpert MRSA Assay (FDA approved for NASAL specimens only), is one component of a comprehensive MRSA colonization surveillance program. It is not intended to diagnose MRSA infection nor to guide or monitor treatment for MRSA infections. Test performance is not FDA approved in patients less than 73 years old. Performed at [facility], [address] [place], [place] [address]   Gastrointestinal Panel by PCR , Stool     Status: None   Collection Time: 07/04/21  5:52 AM   Specimen: Stool  Result Value Ref Range Status   Campylobacter species NOT DETECTED NOT DETECTED Final   Plesimonas shigelloides NOT DETECTED NOT DETECTED Final   Salmonella species NOT DETECTED NOT DETECTED Final   Yersinia enterocolitica NOT DETECTED NOT DETECTED Final   Vibrio species NOT DETECTED NOT DETECTED Final   Vibrio cholerae NOT DETECTED NOT DETECTED Final   Enteroaggregative E coli (EAEC) NOT DETECTED NOT DETECTED Final   Enteropathogenic E coli (EPEC) NOT DETECTED NOT DETECTED Final   Enterotoxigenic E coli (ETEC) NOT DETECTED NOT DETECTED Final   Shiga like toxin producing E coli (STEC) NOT DETECTED NOT DETECTED Final   Shigella/Enteroinvasive E coli (EIEC) NOT DETECTED NOT DETECTED Final   Cryptosporidium NOT DETECTED NOT DETECTED Final   Cyclospora cayetanensis NOT DETECTED NOT DETECTED Final   Entamoeba histolytica NOT DETECTED NOT DETECTED Final   Giardia lamblia NOT DETECTED NOT DETECTED Final   Adenovirus F40/41 NOT DETECTED NOT DETECTED Final   Astrovirus NOT DETECTED NOT DETECTED Final   Norovirus GI/GII NOT DETECTED NOT DETECTED Final   Rotavirus A NOT DETECTED NOT DETECTED Final   Sapovirus (I, II, IV, and V) NOT DETECTED NOT DETECTED Final    Comment: Performed at [facility], [address] [place], [place] [address]  C Difficile Quick Screen w PCR reflex     Status: None   Collection Time: 07/04/21  5:52 AM   Specimen: STOOL  Result Value Ref Range Status   C Diff antigen NEGATIVE NEGATIVE Final   C Diff toxin NEGATIVE NEGATIVE Final   C Diff interpretation No C. difficile detected.  Final    Comment: Performed at [facility], [address] [place],  [address]   Studies/Results: CARDIAC CATHETERIZATION  Result Date: 07/05/2021   Prox RCA to Mid RCA lesion is 25% stenosed.   Ost Cx to Prox Cx lesion is 25% stenosed.   LV end diastolic pressure is moderately elevated.   There is no aortic valve stenosis. Conclusion Mild non-obstructive coronary artery disease. Moderately elevated LVEDP of 22 mmHg, with systemic hypotension. Recommendations: Medical management of Stress cardiomyopathy (Takotsubo). Initiation of beta blocker and ACE/ARB as BP allows. Aspirin 81 mg indefinitely Aggressive secondary prevention   Medications:  Scheduled Meds:  amitriptyline  25 mg Oral QHS  Chlorhexidine Gluconate Cloth  6 each Topical 99991111   folic acid  1 mg Oral Daily   magnesium oxide  400 mg Oral Daily   mouth rinse  15 mL Mouth Rinse BID   midodrine  10 mg Oral TID WC   multivitamin with minerals  1 tablet Oral Daily   nicotine  14 mg Transdermal Daily   pantoprazole  40 mg Oral BID   polyethylene glycol-electrolytes  4,000 mL Oral Once   QUEtiapine  100 mg Oral TID   sodium chloride flush  3 mL Intravenous Q12H   sodium chloride flush  3 mL Intravenous Q12H   thiamine  100 mg Oral Daily   Or   thiamine  100 mg Intravenous Daily   trazodone  50 mg Oral QHS   vitamin B-12  1,000 mcg Oral Daily   Continuous Infusions:  sodium chloride     ampicillin-sulbactam (UNASYN) IV Stopped (07/05/21 1428)   potassium PHOSPHATE IVPB (in mmol)     promethazine (PHENERGAN) injection (IM or IVPB) Stopped (07/03/21 1531)   PRN Meds:.sodium chloride, haloperidol  lactate, hydrOXYzine, loperamide, morphine injection, ondansetron **OR** ondansetron (ZOFRAN) IV, oxyCODONE, promethazine (PHENERGAN) injection (IM or IVPB), sodium chloride flush   Assessment: Principal Problem:   Atrial fibrillation with RVR (HCC) Active Problems:   Mild recurrent major depression (HCC)   Acid reflux   Osteoporosis   History of bowel resection   Rectal prolapse   Diarrhea   Enteritis   Anorexia   Alcohol use   Anxiety   Insomnia   Coffee ground emesis   Complicated grief   Grief at loss of child   Hypotension, chronic   Pressure injury of skin  Diarrhea  Plan: Patient has only had 2 loose bowel movements a day.  Since infectious etiology has been ruled out, Imodium was added to her medication regimen, but patient has not received any today.  Nursing staff advised to give patient Imodium after loose stool.  Patient was advised to ask for Imodium when she has a loose stool as well  If the patient has microscopic colitis, one of the treatments for microscopic colitis does include Imodium.  Therefore, this will help with her loose stool and potential treatment for this as well if this is the etiology  If Imodium does not lead to improvement in her symptoms, Colonoscopy Can Be Considered As an Inpatient with Cardiology Clearance since No Obstructive Disease Is Present on catheterization today  Imodium as currently ordered as 4 times daily as needed, but frequency can be increased once she is at least receiving this amount of medication  Her nausea symptoms are improving, and were likely related to her heart disease on presentation     LOS: 3 days   Carla Antigua, MD 07/05/2021, 3:06 PM

## 2021-07-05 NOTE — Progress Notes (Signed)
Chaplain Maggie made initial visit with pt at bedside. Space was made for empathetic listening, compassionate presence and prayer. Continued support available per on call chaplain.

## 2021-07-05 NOTE — Plan of Care (Signed)

## 2021-07-05 NOTE — Progress Notes (Signed)
[facility] PROGRESS NOTE   Carla Martinez [id number] DOB: Mar 19, 1961 DOA: 07/02/2021  PCP: [name], MD  Brief History/Interval Summary: 60 y.o. female with medical history significant for depression, anxiety, insomnia, history of bowel resection, anorexia, mild recurrent major depression, GERD, baseline hypotension, who presented to the emergency department for chief concerns of nausea, vomiting, diarrhea.  Patient has been very depressed and tearful since May 2022 when her eldest daughter passed away.  Apparently has been drinking alcohol and doing other recreational drugs including taking NyQuil.  Was supposed to undergo EGD and colonoscopy and was going through her bowel prep when she developed nausea vomiting.  This prompted admission to the hospital.  This was followed by alteration in mental status changes and agitation requiring transfer to the ICU and was placed on Precedex.    Consultants: Cardiology.  Gastroenterology  Procedures: Plan is for cardiac catheterization today  Antibiotics: Anti-infectives (From admission, onward)    Start     Dose/Rate Route Frequency Ordered Stop   07/03/21 1400  [MAR Hold]  Ampicillin-Sulbactam (UNASYN) 3 g in sodium chloride 0.9 % 100 mL IVPB        (MAR Hold since Fri 07/05/2021 at 1009.Hold Reason: Transfer to a Procedural area)   3 g 200 mL/hr over 30 Minutes Intravenous Every 6 hours 07/03/21 1045     07/02/21 1400  cefTRIAXone (ROCEPHIN) 1 g in sodium chloride 0.9 % 100 mL IVPB  Status:  Discontinued        1 g 200 mL/hr over 30 Minutes Intravenous Every 24 hours 07/02/21 1342 07/02/21 1352   07/02/21 1400  metroNIDAZOLE (FLAGYL) IVPB 500 mg  Status:  Discontinued        500 mg 100 mL/hr over 60 Minutes Intravenous Every 12 hours 07/02/21 1342 07/03/21 0955   07/02/21 1400  cefTRIAXone (ROCEPHIN) 2 g in sodium chloride 0.9 % 100 mL IVPB        2 g 200 mL/hr over 30 Minutes Intravenous  Once 07/02/21 1351 07/02/21  1447       Subjective/Interval History: Patient mentions that she continues to have chest pain and shortness of breath even at rest.  Denies any nausea vomiting.  Abdominal pain is slightly better.  Had a bowel movement this morning.      Assessment/Plan:  Acute metabolic encephalopathy/drug withdrawal/alcohol withdrawal Concern was for withdrawal from alcohol and other illicit recreational drugs that she had been doing recently.   Due to significant agitation she had to be placed on Precedex infusion.  No focal neurological deficits were noted.  She did develop hypotension as a result of this medication.  Her home medication regimen being slowly resumed.  We will discontinue Precedex as she appears to be much calmer.  Haldol as needed.  Have requested psychiatry to assist with medication management.  She is currently on amitriptyline, Seroquel, trazodone. Urine drug screen positive for amphetamines, cannabinoid, phencyclidine as well as tricyclic.  Hypotension Based on previous records patient does have chronic hypotension.  This was made worse by Precedex infusion.  Started on midodrine.   Blood pressures have stabilized.  Hypotension most likely due to Precedex.  Procalcitonin was 0.27.  Lactic acid level was only 2.0.    Chronic systolic CHF Echocardiogram showed EF to be 25 to 30% which is new for this patient.  Cardiology is following.  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  Patient going through grief as she lost her daughter recently.  Unable to use ACE inhibitor or ARB or  even beta-blockers due to low blood pressures.   Patient continues to have chest pain and dyspnea.  Plan is for cardiac catheterization today.    Atrial fibrillation with RVR Converted to sinus rhythm after she was given diltiazem.  Seen by cardiology.  Placed on heparin infusion.  TSH 0.75.  Free T4 1.15.  Echocardiogram shows low EF 25 to 30%.  See above.  Possible NSTEMI Could be due to A. fib with RVR and acute illness.   Placed on heparin infusion.  Cardiology is following.  Nausea vomiting and diarrhea/concern for GI bleed/gastroenteritis There was some concern for coffee-ground emesis.  Drop in hemoglobin is likely dilutional.  Patient has been taking increased amount of ibuprofen along with the use of alcohol recently.  Initially placed on Protonix infusion.  No overt bleeding has been noted.  Changed to twice a day Protonix.  C. difficile was negative.    Normocytic anemia Mild drop in hemoglobin likely dilutional.  No overt bleeding noted.  Continue to monitor.  Aspiration pneumonia/acute respiratory failure with hypoxia Chest x-ray showed right-sided opacity.  She could have aspirated.  Currently on Unasyn.  Requiring 2 to 5 L of oxygen by nasal cannula.  Wean down as tolerated.  Keep saturations greater than 90%.  Hypodense lesion in the liver with peripheral nodular enhancement CT scan suggest that this could be hemangioma.  Will need outpatient follow-up.  Hepatitis panel unremarkable  Hypokalemia/hypomagnesemia Continue to replete potassium.  Magnesium level has improved.  History of alcohol abuse On CIWA protocol.  Noted to be stable.  Improved.  History of depression and anxiety Severe anxiety also triggered recently by the loss of her daughter.  Patient seems to be quite anxious.  Psychiatry has been consulted to assist with management.  Stage I sacral decubitus Pressure Injury 07/02/21 Sacrum Medial Stage 1 -  Intact skin with non-blanchable redness of a localized area usually over a bony prominence. skin intact, pink, non-blanchable redness (Active)  07/02/21 2030  Location: Sacrum  Location Orientation: Medial  Staging: Stage 1 -  Intact skin with non-blanchable redness of a localized area usually over a bony prominence.  Wound Description (Comments): skin intact, pink, non-blanchable redness  Present on Admission: Yes     DVT Prophylaxis: On heparin infusion Code Status:  DNR Family Communication: No family at bedside Disposition Plan: Hopefully return home when improved  Status is: Inpatient  Remains inpatient appropriate because:Altered mental status, Ongoing diagnostic testing needed not appropriate for outpatient work up, and IV treatments appropriate due to intensity of illness or inability to take PO  Dispo: The patient is from: Home              Anticipated d/c is to: Home              Patient currently is not medically stable to d/c.   Difficult to place patient No       Medications: Scheduled:  [MAR Hold] amitriptyline  25 mg Oral QHS   [START ON 07/06/2021] aspirin  81 mg Oral Pre-Cath   [MAR Hold] Chlorhexidine Gluconate Cloth  6 each Topical [id number]   [MAR Hold] folic acid  1 mg Oral Daily   [MAR Hold] magnesium oxide  400 mg Oral Daily   [MAR Hold] mouth rinse  15 mL Mouth Rinse BID   [MAR Hold] midodrine  10 mg Oral TID WC   [MAR Hold] multivitamin with minerals  1 tablet Oral Daily   [MAR Hold] nicotine  14 mg  Transdermal Daily   [MAR Hold] pantoprazole  40 mg Oral BID   [MAR Hold] polyethylene glycol-electrolytes  4,000 mL Oral Once   [MAR Hold] QUEtiapine  100 mg Oral TID   [MAR Hold] sodium chloride flush  3 mL Intravenous Q12H   [MAR Hold] thiamine  100 mg Oral Daily   Or   [MAR Hold] thiamine  100 mg Intravenous Daily   [MAR Hold] trazodone  50 mg Oral QHS   [MAR Hold] vitamin B-12  1,000 mcg Oral Daily   Continuous:  sodium chloride     sodium chloride     Followed by   sodium chloride     [MAR Hold] ampicillin-sulbactam (UNASYN) IV 3 g (07/05/21 0755)   [MAR Hold] dexmedetomidine (PRECEDEX) IV infusion 0.4 mcg/kg/hr (07/05/21 0746)   heparin Stopped (07/05/21 1004)   [MAR Hold] promethazine (PHENERGAN) injection (IM or IVPB) Stopped (07/03/21 1531)   [id number] chloride, [MAR Hold] acetaminophen **OR** [MAR Hold] acetaminophen, [MAR Hold] haloperidol lactate, [MAR Hold] hydrOXYzine, [MAR Hold] loperamide, [MAR Hold]   morphine injection, [MAR Hold] ondansetron **OR** [MAR Hold] ondansetron (ZOFRAN) IV, [MAR Hold] oxyCODONE, [MAR Hold] promethazine (PHENERGAN) injection (IM or IVPB), sodium chloride flush   Objective:  Vital Signs  Vitals:   07/05/21 0600 07/05/21 0605 07/05/21 0606 07/05/21 1010  BP:  99/64  98/66  Pulse: 85  94 92  Resp: (!) 21 (!) '25 16 16  '$ Temp:    98.6 F (37 C)  TempSrc:    Oral  SpO2: 100%  100%   Weight:    39 kg  Height:    '5\' 3"'$  (1.6 m)    Intake/Output Summary (Last 24 hours) at 07/05/2021 1033 Last data filed at 07/05/2021 0746 Gross per 24 hour  Intake 1828.08 ml  Output 300 ml  Net 1528.08 ml    Filed Weights   07/02/21 0633 07/05/21 1010  Weight: 39 kg 39 kg    General appearance: Awake alert.  In no distress Resp: Normal effort at rest.  Diminished air entry at the bases with few crackles.  No wheezing or rhonchi. Cardio: S1-S2 is normal regular.  No S3-S4.  No rubs murmurs or bruit GI: Abdomen is soft.  Nontender nondistended.  Bowel sounds are present normal.  No masses organomegaly Extremities: No edema.  Full range of motion of lower extremities. Neurologic: Alert and oriented x3.  No focal neurological deficits.      Lab Results:  Data Reviewed: I have personally reviewed following labs and imaging studies  CBC: Recent Labs  Lab 07/02/21 0644 07/03/21 0518 07/04/21 0524 07/05/21 0604  WBC 16.3* 18.3* 13.5* 9.9  NEUTROABS 14.3*  --   --   --   HGB 13.0 10.3* 10.5* 9.9*  HCT 37.1 29.4* 29.8* 29.1*  MCV 94.2 96.7 98.3 96.0  PLT 428* 252 243 246     Basic Metabolic Panel: Recent Labs  Lab 07/02/21 0644 07/02/21 0906 07/03/21 0518 07/04/21 0524 07/05/21 0604  NA 137  --  134* 136 136  K 3.0*  --  4.2 3.6 3.3*  CL 84*  --  97* 100 102  CO2 30  --  '28 27 28  '$ GLUCOSE 215*  --  126* 84 107*  BUN 28*  --  '18 16 7  '$ CREATININE 1.15*  --  0.77 0.69 0.68  CALCIUM 10.6*  --  8.0* 7.6* 7.0*  MG  --  1.8  --  1.5* 1.9  PHOS 2.9  --    --   --   --  GFR: Estimated Creatinine Clearance: 47.8 mL/min (by C-G formula based on SCr of 0.68 mg/dL).  Liver Function Tests: Recent Labs  Lab 07/02/21 0644 07/04/21 0524 07/05/21 0604  AST 67* 37 30  ALT 44 37 36  ALKPHOS 67 46 40  BILITOT 0.6 0.7 0.5  PROT 7.2 5.0* 4.5*  ALBUMIN 4.3 2.6* 2.1*     Recent Labs  Lab 07/02/21 0644  LIPASE 30     Coagulation Profile: Recent Labs  Lab 07/02/21 1304  INR 1.1      Thyroid Function Tests: Recent Labs    07/02/21 1103  TSH 0.751  FREET4 1.15*      Recent Results (from the past 240 hour(s))  SARS CORONAVIRUS 2 (TAT 6-24 HRS) Nasopharyngeal Nasopharyngeal Swab     Status: None   Collection Time: 07/02/21  1:50 PM   Specimen: Nasopharyngeal Swab  Result Value Ref Range Status   SARS Coronavirus 2 NEGATIVE NEGATIVE Final    Comment: (NOTE) SARS-CoV-2 target nucleic acids are NOT DETECTED.  The SARS-CoV-2 RNA is generally detectable in upper and lower respiratory specimens during the acute phase of infection. Negative results do not preclude SARS-CoV-2 infection, do not rule out co-infections with other pathogens, and should not be used as the sole basis for treatment or other patient management decisions. Negative results must be combined with clinical observations, patient history, and epidemiological information. The expected result is Negative.  Fact Sheet for Patients: SugarRoll.be  Fact Sheet for Healthcare Providers: https://www.woods-mathews.com/  This test is not yet approved or cleared by the [place] FDA and  has been authorized for detection and/or diagnosis of SARS-CoV-2 by FDA under an Emergency Use Authorization (EUA). This EUA will remain  in effect (meaning this test can be used) for the duration of the COVID-19 declaration under Se ction 564(b)(1) of the Act, 21 U.S.C. section 360bbb-3(b)(1), unless the authorization is terminated  or revoked sooner.  Performed at [facility], [place] [address] [place], [place] [address]   MRSA Next Gen by PCR, Nasal     Status: None   Collection Time: 07/02/21  8:26 PM   Specimen: Nasal Mucosa; Nasal Swab  Result Value Ref Range Status   MRSA by PCR Next Gen NOT DETECTED NOT DETECTED Final    Comment: (NOTE) The GeneXpert MRSA Assay (FDA approved for NASAL specimens only), is one component of a comprehensive MRSA colonization surveillance program. It is not intended to diagnose MRSA infection nor to guide or monitor treatment for MRSA infections. Test performance is not FDA approved in patients less than 41 years old. Performed at [facility], [address] [place], [place] [address]   Gastrointestinal Panel by PCR , Stool     Status: None   Collection Time: 07/04/21  5:52 AM   Specimen: Stool  Result Value Ref Range Status   Campylobacter species NOT DETECTED NOT DETECTED Final   Plesimonas shigelloides NOT DETECTED NOT DETECTED Final   Salmonella species NOT DETECTED NOT DETECTED Final   Yersinia enterocolitica NOT DETECTED NOT DETECTED Final   Vibrio species NOT DETECTED NOT DETECTED Final   Vibrio cholerae NOT DETECTED NOT DETECTED Final   Enteroaggregative E coli (EAEC) NOT DETECTED NOT DETECTED Final   Enteropathogenic E coli (EPEC) NOT DETECTED NOT DETECTED Final   Enterotoxigenic E coli (ETEC) NOT DETECTED NOT DETECTED Final   Shiga like toxin producing E coli (STEC) NOT DETECTED NOT DETECTED Final   Shigella/Enteroinvasive E coli (EIEC) NOT DETECTED NOT DETECTED  Final   Cryptosporidium NOT DETECTED NOT DETECTED Final   Cyclospora cayetanensis NOT DETECTED NOT DETECTED Final   Entamoeba histolytica NOT DETECTED NOT DETECTED Final   Giardia lamblia NOT DETECTED NOT DETECTED Final   Adenovirus F40/41 NOT DETECTED NOT DETECTED Final   Astrovirus NOT DETECTED NOT DETECTED Final   Norovirus GI/GII NOT DETECTED NOT DETECTED Final   Rotavirus A NOT  DETECTED NOT DETECTED Final   Sapovirus (I, II, IV, and V) NOT DETECTED NOT DETECTED Final    Comment: Performed at [facility], [address] [place], [place] [address]  C Difficile Quick Screen w PCR reflex     Status: None   Collection Time: 07/04/21  5:52 AM   Specimen: STOOL  Result Value Ref Range Status   C Diff antigen NEGATIVE NEGATIVE Final   C Diff toxin NEGATIVE NEGATIVE Final   C Diff interpretation No C. difficile detected.  Final    Comment: Performed at [facility], [address], [place], [place] [address]       Radiology Studies: No results found.     LOS: 3 days   Axton [employer] on [email or web address]  07/05/2021, 10:33 AM

## 2021-07-06 ENCOUNTER — Other Ambulatory Visit: Payer: Self-pay

## 2021-07-06 DIAGNOSIS — R197 Diarrhea, unspecified: Secondary | ICD-10-CM | POA: Diagnosis not present

## 2021-07-06 DIAGNOSIS — I9589 Other hypotension: Secondary | ICD-10-CM

## 2021-07-06 DIAGNOSIS — F19239 Other psychoactive substance dependence with withdrawal, unspecified: Secondary | ICD-10-CM

## 2021-07-06 DIAGNOSIS — K529 Noninfective gastroenteritis and colitis, unspecified: Secondary | ICD-10-CM | POA: Diagnosis not present

## 2021-07-06 DIAGNOSIS — R112 Nausea with vomiting, unspecified: Secondary | ICD-10-CM | POA: Diagnosis not present

## 2021-07-06 DIAGNOSIS — I5022 Chronic systolic (congestive) heart failure: Secondary | ICD-10-CM | POA: Diagnosis not present

## 2021-07-06 LAB — CBC
HCT: 31 % — ABNORMAL LOW (ref 36.0–46.0)
Hemoglobin: 10.8 g/dL — ABNORMAL LOW (ref 12.0–15.0)
MCH: 34.1 pg — ABNORMAL HIGH (ref 26.0–34.0)
MCHC: 34.8 g/dL (ref 30.0–36.0)
MCV: 97.8 fL (ref 80.0–100.0)
Platelets: 257 10*3/uL (ref 150–400)
RBC: 3.17 MIL/uL — ABNORMAL LOW (ref 3.87–5.11)
RDW: 13.2 % (ref 11.5–15.5)
WBC: 11.3 10*3/uL — ABNORMAL HIGH (ref 4.0–10.5)
nRBC: 0 % (ref 0.0–0.2)

## 2021-07-06 LAB — COMPREHENSIVE METABOLIC PANEL
ALT: 31 U/L (ref 0–44)
AST: 21 U/L (ref 15–41)
Albumin: 2.2 g/dL — ABNORMAL LOW (ref 3.5–5.0)
Alkaline Phosphatase: 43 U/L (ref 38–126)
Anion gap: 8 (ref 5–15)
BUN: <5 mg/dL — ABNORMAL LOW (ref 6–20)
CO2: 25 mmol/L (ref 22–32)
Calcium: 6.8 mg/dL — ABNORMAL LOW (ref 8.9–10.3)
Chloride: 104 mmol/L (ref 98–111)
Creatinine, Ser: 0.59 mg/dL (ref 0.44–1.00)
GFR, Estimated: >60 mL/min (ref >60)
Glucose, Bld: 87 mg/dL (ref 70–99)
Potassium: 4.1 mmol/L (ref 3.5–5.1)
Sodium: 137 mmol/L (ref 135–145)
Total Bilirubin: 0.9 mg/dL (ref 0.3–1.2)
Total Protein: 4.8 g/dL — ABNORMAL LOW (ref 6.5–8.1)

## 2021-07-06 LAB — PHOSPHORUS: Phosphorus: 2 mg/dL — ABNORMAL LOW (ref 2.5–4.6)

## 2021-07-06 MED ORDER — POTASSIUM PHOSPHATES 15 MMOLE/5ML IV SOLN
30.0000 mmol | Freq: Once | INTRAVENOUS | Status: AC
  Administered 2021-07-06: 30 mmol via INTRAVENOUS
  Filled 2021-07-06: qty 10

## 2021-07-06 MED ORDER — ASPIRIN EC 81 MG PO TBEC
81.0000 mg | DELAYED_RELEASE_TABLET | Freq: Every day | ORAL | Status: DC
  Administered 2021-07-06 – 2021-07-09 (×4): 81 mg via ORAL
  Filled 2021-07-06 (×4): qty 1

## 2021-07-06 MED ORDER — AMOXICILLIN-POT CLAVULANATE 875-125 MG PO TABS
1.0000 | ORAL_TABLET | Freq: Two times a day (BID) | ORAL | Status: DC
  Administered 2021-07-06 – 2021-07-09 (×7): 1 via ORAL
  Filled 2021-07-06 (×8): qty 1

## 2021-07-06 MED ORDER — SILVER SULFADIAZINE 1 % EX CREA
TOPICAL_CREAM | Freq: Two times a day (BID) | CUTANEOUS | Status: DC
  Filled 2021-07-06: qty 85

## 2021-07-06 MED ORDER — METOPROLOL SUCCINATE ER 25 MG PO TB24
12.5000 mg | ORAL_TABLET | Freq: Every day | ORAL | Status: DC
Start: 2021-07-06 — End: 2021-07-09
  Administered 2021-07-06 – 2021-07-09 (×4): 12.5 mg via ORAL
  Filled 2021-07-06 (×5): qty 1
  Filled 2021-07-06: qty 0.5

## 2021-07-06 MED ORDER — ENOXAPARIN SODIUM 40 MG/0.4ML IJ SOSY
40.0000 mg | PREFILLED_SYRINGE | INTRAMUSCULAR | Status: DC
  Administered 2021-07-06 – 2021-07-08 (×3): 40 mg via SUBCUTANEOUS
  Filled 2021-07-06 (×3): qty 0.4

## 2021-07-06 MED ORDER — BUSPIRONE HCL 10 MG PO TABS
10.0000 mg | ORAL_TABLET | Freq: Three times a day (TID) | ORAL | Status: DC
  Administered 2021-07-06 – 2021-07-09 (×10): 10 mg via ORAL
  Filled 2021-07-06 (×14): qty 1

## 2021-07-06 MED ORDER — LISINOPRIL 5 MG PO TABS
2.5000 mg | ORAL_TABLET | Freq: Every day | ORAL | Status: DC
Start: 2021-07-06 — End: 2021-07-09
  Administered 2021-07-06 – 2021-07-09 (×4): 2.5 mg via ORAL
  Filled 2021-07-06 (×4): qty 1

## 2021-07-06 NOTE — TOC Progression Note (Signed)
Transition of Care [facility]) - Progression Note    Patient Details  Name: Carla Martinez MRN: [id number] Date of Birth: 23-Jun-1961  [facility]) CM/SW Contact  Shelbie Hutching, RN Phone Number: 07/06/2021, 1:15 PM  Clinical Narrative:    RNCM met with patient at the bedside today.  [name] and patient's son are just leaving to return home.  Patient's son will be 92 soon, he is disabled and non verbal, mother is caring for him now.  Patient reports that since her daughter died she has been having a hard time and has been considering finding a mental health provider.  Patient reports that she does not currently abuse alcohol but has been abusing the Nyquil to help calm her nerves and sleep at night. RNCM provided patient with information on outpatient mental health providers and substance abuse treatment.  Patient feels that once her mental health is under control she will be able to manage her substance abuse.  Patient grateful for resources, she is able to move to progressive unit out of the ICU today.      Expected Discharge Plan: Home/Self Care Barriers to Discharge: Continued Medical Work up  Expected Discharge Plan and Services Expected Discharge Plan: Home/Self Care   Discharge Planning Services: CM Consult   Living arrangements for the past 2 months: Apartment                 DME Arranged: N/A DME Agency: NA       HH Arranged: NA HH Agency: NA         Social Determinants of Health (SDOH) Interventions    Readmission Risk Interventions No flowsheet data found.

## 2021-07-06 NOTE — Progress Notes (Signed)
[facility]  SUBJECTIVE: Patient laying in bed, reports feeling much better, denies chest pain   Vitals:   07/05/21 2000 07/06/21 0000 07/06/21 0400 07/06/21 0850  BP: 104/71 (!) 104/56 103/68 103/68  Pulse: (!) 105 (!) 108 96 (!) 108  Resp: (!) 32 (!) 27 (!) 23 (!) 26  Temp: 98.6 F (37 C)  98.3 F (36.8 C) 98 F (36.7 C)  TempSrc:   Oral Oral  SpO2: 99% 100% 100% 97%  Weight:      Height:         Intake/Output Summary (Last 24 hours) at 07/06/2021 1045 Last data filed at 07/06/2021 0900 Gross per 24 hour  Intake 1028.66 ml  Output 1003 ml  Net 25.66 ml      PHYSICAL EXAM  General: Well developed, well nourished, in no acute distress HEENT:  Normocephalic and atramatic Neck:  No JVD.  Lungs: Clear bilaterally to auscultation and percussion. Heart: HRRR . Normal S1 and S2 without gallops or murmurs.  Abdomen: Bowel sounds are positive, abdomen soft and non-tender  Msk:  Back normal, normal gait. Normal strength and tone for age. Extremities: No clubbing, cyanosis or edema.   Neuro: Alert and oriented X 3. Psych:  Good affect, responds appropriately   LABS: Basic Metabolic Panel: Recent Labs    07/04/21 0524 07/05/21 0604 07/06/21 0552  NA 136 136 137  K 3.6 3.3* 4.1  CL 100 102 104  CO2 '27 28 25  '$ GLUCOSE 84 107* 87  BUN 16 7 <5*  CREATININE 0.69 0.68 0.59  CALCIUM 7.6* 7.0* 6.8*  MG 1.5* 1.9  --   PHOS  --  <1.0* 2.0*   Liver Function Tests: Recent Labs    07/05/21 0604 07/06/21 0552  AST 30 21  ALT 36 31  ALKPHOS 40 43  BILITOT 0.5 0.9  PROT 4.5* 4.8*  ALBUMIN 2.1* 2.2*   No results for input(s): LIPASE, AMYLASE in the last 72 hours. CBC: Recent Labs    07/05/21 0604 07/06/21 0552  WBC 9.9 11.3*  HGB 9.9* 10.8*  HCT 29.1* 31.0*  MCV 96.0 97.8  PLT 246 257   Cardiac Enzymes: No results for input(s): CKTOTAL, CKMB, CKMBINDEX, TROPONINI in the last 72 hours. BNP: Invalid input(s): POCBNP D-Dimer: No results for input(s): DDIMER  in the last 72 hours. Hemoglobin A1C: No results for input(s): HGBA1C in the last 72 hours. Fasting Lipid Panel: No results for input(s): CHOL, HDL, LDLCALC, TRIG, CHOLHDL, LDLDIRECT in the last 72 hours. Thyroid Function Tests: No results for input(s): TSH, T4TOTAL, T3FREE, THYROIDAB in the last 72 hours.  Invalid input(s): FREET3 Anemia Panel: No results for input(s): VITAMINB12, FOLATE, FERRITIN, TIBC, IRON, RETICCTPCT in the last 72 hours.  CARDIAC CATHETERIZATION  Result Date: 07/05/2021   Prox RCA to Mid RCA lesion is 25% stenosed.   Ost Cx to Prox Cx lesion is 25% stenosed.   LV end diastolic pressure is moderately elevated.   There is no aortic valve stenosis. Conclusion Mild non-obstructive coronary artery disease. Moderately elevated LVEDP of 22 mmHg, with systemic hypotension. Recommendations: Medical management of Stress cardiomyopathy (Takotsubo). Initiation of beta blocker and ACE/ARB as BP allows. Aspirin 81 mg indefinitely Aggressive secondary prevention     Echo LVEF 25 to 30% with regional wall motion abnormalities  TELEMETRY: Sinus rhythm:  ASSESSMENT AND PLAN:  Principal Problem:   Adjustment disorder with mixed anxiety and depressed mood Active Problems:   Mild recurrent major depression (HCC)   Acid reflux  Osteoporosis   History of bowel resection   Rectal prolapse   Diarrhea   Enteritis   Anorexia   Alcohol use   Anxiety   Insomnia   Coffee ground emesis   Atrial fibrillation with RVR (HCC)   Complicated grief   Grief at loss of child   Hypotension, chronic   Pressure injury of skin   Alcohol abuse   Generalized anxiety disorder    Takotsubo's cardiomyopathy, with elevated troponin [id number] ), ongoing chest pain with nonspecific ST-T wave abnormalities on ECG.  Of note, patient reports her daughter passed away 03/24/2021.  2D echocardiogram revealed LVEF 25 to 30% with anteroapical regional wall motion normalities.  Cardiac catheterization  07/05/2021 revealed insignificant coronary artery disease consistent with Takotsubo's. Paroxysmal atrial fibrillation upon presentation, converted to sinus rhythm, remains in sinus rhythm Acute metabolic encephalopathy felt to be due to possible drug and alcohol withdrawal, improved, conversing appropriately today Borderline hypotension, improved  Recommendations  1.  Start low-dose enteric aspirin 81 mg daily 2.  Start low-dose metoprolol succinate 12.5 mg daily 3.  Start low-dose lisinopril 2.5 mg daily 4.  No further cardiac diagnostics at this time     Isaias Cowman, MD, PhD, [facility] 07/06/2021 10:45 AM

## 2021-07-06 NOTE — Progress Notes (Signed)
[facility] PROGRESS NOTE   Cheril Droz [id number] DOB: 24-Oct-1960 DOA: 07/02/2021  PCP: [name], MD  Brief History/Interval Summary: 60 y.o. female with medical history significant for depression, anxiety, insomnia, history of bowel resection, anorexia, mild recurrent major depression, GERD, baseline hypotension, who presented to the emergency department for chief concerns of nausea, vomiting, diarrhea.  Patient has been very depressed and tearful since May 2022 when her eldest daughter passed away.  Apparently has been drinking alcohol and doing other recreational drugs including taking NyQuil.  Was supposed to undergo EGD and colonoscopy and was going through her bowel prep when she developed nausea vomiting.  This prompted admission to the hospital.  This was followed by alteration in mental status changes and agitation requiring transfer to the ICU and was placed on Precedex.    Consultants: Cardiology.  Gastroenterology  Procedures: Underwent cardiac catheterization on 9/16.  Antibiotics: Anti-infectives (From admission, onward)    Start     Dose/Rate Route Frequency Ordered Stop   07/03/21 1400  Ampicillin-Sulbactam (UNASYN) 3 g in sodium chloride 0.9 % 100 mL IVPB        3 g 200 mL/hr over 30 Minutes Intravenous Every 6 hours 07/03/21 1045     07/02/21 1400  cefTRIAXone (ROCEPHIN) 1 g in sodium chloride 0.9 % 100 mL IVPB  Status:  Discontinued        1 g 200 mL/hr over 30 Minutes Intravenous Every 24 hours 07/02/21 1342 07/02/21 1352   07/02/21 1400  metroNIDAZOLE (FLAGYL) IVPB 500 mg  Status:  Discontinued        500 mg 100 mL/hr over 60 Minutes Intravenous Every 12 hours 07/02/21 1342 07/03/21 0955   07/02/21 1400  cefTRIAXone (ROCEPHIN) 2 g in sodium chloride 0.9 % 100 mL IVPB        2 g 200 mL/hr over 30 Minutes Intravenous  Once 07/02/21 1351 07/02/21 1447       Subjective/Interval History: Patient continues to have some chest tightness at  times.  Overall her mood seems to be better.  However she does say that she did not sleep well last night.  She was explained the rationale behind did not increasing the dose of her trazodone.  She was told this was because of her prolonged QT interval.  She seems to understand this at this time.  Continues to have loose stools.  She reported 3 yesterday.  Complains of abdominal distention.    Assessment/Plan:  Acute metabolic encephalopathy/drug withdrawal/alcohol withdrawal Concern was for withdrawal from alcohol and other illicit recreational drugs that she had been doing recently.   Due to significant agitation she had to be placed on Precedex infusion.  No focal neurological deficits were noted.  She did develop hypotension as a result of this medication.  She was started back on her trazodone at a much lower dose along with her amitriptyline and Seroquel.  We are limited due to prolonged QT.  Will not increase the dose of these medications any further.  We will need to monitor QT interval closely.  If there is worsening then we may have to back off on some of these medications.  QTc noted to be normal on today's EKG. Urine drug screen positive for amphetamines, cannabinoid, phencyclidine as well as tricyclic.  Hypotension Based on previous records patient does have chronic hypotension.  This was made worse by Precedex infusion.  Started on midodrine.   Blood pressures have stabilized.  Hypotension most likely due to  Precedex.  Procalcitonin was 0.27.  Lactic acid level was only 2.0.   Stable this morning.  Chronic systolic CHF/NSTEMI Echocardiogram showed EF to be 25 to 30% which is new for this patient.  Cardiology is following.  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  Patient going through grief as she lost her daughter recently.  Unable to use ACE inhibitor or ARB or even beta-blockers due to low blood pressures.   Due to persistent chest tightness and shortness of breath she underwent cardiac  catheterization which did not show any significant coronary artery disease.  Cardiology is following.  Patient started on aspirin, low-dose ACE inhibitor and low-dose beta-blocker.  Will need to see how her blood pressure is able to tolerate these medications.    Atrial fibrillation with RVR Converted to sinus rhythm after she was given diltiazem.  Seen by cardiology.  She was placed on heparin infusion initially.  Now on aspirin per cardiology.  TSH 0.75.  Free T4 1.15.  Echocardiogram shows low EF 25 to 30%.  See above.  Nausea vomiting and diarrhea/concern for GI bleed/gastroenteritis There was some concern for coffee-ground emesis.  Drop in hemoglobin is likely dilutional.  Patient has been taking increased amount of ibuprofen along with the use of alcohol recently.  Initially placed on Protonix infusion.  No overt bleeding has been noted.  Changed to twice a day Protonix.  C. difficile was negative.   Continues to have loose stools.  Gastroenterology is following.  Initially EGD and colonoscopy was being considered since patient has had the symptoms longstanding and we is being followed by gastroenterology in the outpatient setting.  However due to her new cardiac issues endoscopies could have moderate to high risk of cardiac complications so they are being deferred for now.  Normocytic anemia Mild drop in hemoglobin likely dilutional.  No overt bleeding noted.  Continue to monitor.  Aspiration pneumonia/acute respiratory failure with hypoxia Chest x-ray showed right-sided opacity.  She could have aspirated.  Respiratory status is stable.  It looks like she has been weaned off of oxygen.  She was placed on Unasyn.  We will change her to Augmentin and plan for a 7-day course which will end on 9/21.    Hypodense lesion in the liver with peripheral nodular enhancement CT scan suggest that this could be hemangioma.  Will need outpatient follow-up.  Hepatitis panel  unremarkable  Hypokalemia/hypomagnesemia Continue to replete potassium.  Magnesium level has improved.  History of alcohol abuse On CIWA protocol.  Noted to be stable.  Improved.  History of depression and anxiety Severe anxiety also triggered recently by the loss of her daughter.  Patient seems to be quite anxious.   Patient seen by psychiatry.  They do not recommend increasing the dose of either her Seroquel or trazodone at this time.  They offered her BuSpar which she declined.   Rationale for not being able to use alternative agents due to her QT interval explained to the patient today.  She is willing to try BuSpar.  This has been ordered.  Stage I sacral decubitus Pressure Injury 07/02/21 Sacrum Medial Stage 1 -  Intact skin with non-blanchable redness of a localized area usually over a bony prominence. skin intact, pink, non-blanchable redness (Active)  07/02/21 2030  Location: Sacrum  Location Orientation: Medial  Staging: Stage 1 -  Intact skin with non-blanchable redness of a localized area usually over a bony prominence.  Wound Description (Comments): skin intact, pink, non-blanchable redness  Present on Admission: Yes  DVT Prophylaxis: Now on Lovenox Code Status: DNR Family Communication: No family at bedside Disposition Plan: Hopefully return home when improved.  Start mobilizing.  Status is: Inpatient  Remains inpatient appropriate because:Altered mental status, Ongoing diagnostic testing needed not appropriate for outpatient work up, and IV treatments appropriate due to intensity of illness or inability to take PO  Dispo: The patient is from: Home              Anticipated d/c is to: Home              Patient currently is not medically stable to d/c.   Difficult to place patient No       Medications: Scheduled:  amitriptyline  25 mg Oral QHS   aspirin EC  81 mg Oral Daily   busPIRone  10 mg Oral TID   Chlorhexidine Gluconate Cloth  6 each Topical Q0600    enoxaparin (LOVENOX) injection  40 mg Subcutaneous [id number]   folic acid  1 mg Oral Daily   lisinopril  2.5 mg Oral Daily   magnesium oxide  400 mg Oral Daily   mouth rinse  15 mL Mouth Rinse BID   metoprolol succinate  12.5 mg Oral Daily   midodrine  10 mg Oral TID WC   multivitamin with minerals  1 tablet Oral Daily   nicotine  14 mg Transdermal Daily   pantoprazole  40 mg Oral BID   polyethylene glycol-electrolytes  4,000 mL Oral Once   QUEtiapine  100 mg Oral TID   sodium chloride flush  3 mL Intravenous Q12H   sodium chloride flush  3 mL Intravenous Q12H   thiamine  100 mg Oral Daily   Or   thiamine  100 mg Intravenous Daily   traZODone  50 mg Oral QHS   vitamin B-12  1,000 mcg Oral Daily   Continuous:  sodium chloride     ampicillin-sulbactam (UNASYN) IV 3 g (07/06/21 0851)   potassium PHOSPHATE IVPB (in mmol) 30 mmol (07/06/21 1050)   promethazine (PHENERGAN) injection (IM or IVPB) Stopped (07/03/21 1531)   [id number] chloride, haloperidol lactate, hydrOXYzine, loperamide, morphine injection, ondansetron **OR** ondansetron (ZOFRAN) IV, oxyCODONE, promethazine (PHENERGAN) injection (IM or IVPB), sodium chloride flush   Objective:  Vital Signs  Vitals:   07/05/21 2000 07/06/21 0000 07/06/21 0400 07/06/21 0850  BP: 104/71 (!) 104/56 103/68 103/68  Pulse: (!) 105 (!) 108 96 (!) 108  Resp: (!) 32 (!) 27 (!) 23 (!) 26  Temp: 98.6 F (37 C)  98.3 F (36.8 C) 98 F (36.7 C)  TempSrc:   Oral Oral  SpO2: 99% 100% 100% 97%  Weight:      Height:        Intake/Output Summary (Last 24 hours) at 07/06/2021 1153 Last data filed at 07/06/2021 0900 Gross per 24 hour  Intake 1028.66 ml  Output 1003 ml  Net 25.66 ml    Filed Weights   07/02/21 0633 07/05/21 1010  Weight: 39 kg 39 kg    General appearance: Awake alert.  In no distress Resp: Diminished air entry at the bases.  No wheezing or rhonchi.  Few crackles. Cardio: S1-S2 is normal regular.  No S3-S4.  No rubs  murmurs or bruit GI: Abdomen is soft.  Nontender nondistended.  Bowel sounds are present normal.  No masses organomegaly Extremities: No edema.  Full range of motion of lower extremities. Neurologic: Alert and oriented x3.  No focal neurological deficits.     Lab Results:  Data Reviewed: I have personally reviewed following labs and imaging studies  CBC: Recent Labs  Lab 07/02/21 0644 07/03/21 0518 07/04/21 0524 07/05/21 0604 07/06/21 0552  WBC 16.3* 18.3* 13.5* 9.9 11.3*  NEUTROABS 14.3*  --   --   --   --   HGB 13.0 10.3* 10.5* 9.9* 10.8*  HCT 37.1 29.4* 29.8* 29.1* 31.0*  MCV 94.2 96.7 98.3 96.0 97.8  PLT 428* 252 243 246 257     Basic Metabolic Panel: Recent Labs  Lab 07/02/21 0644 07/02/21 0906 07/03/21 0518 07/04/21 0524 07/05/21 0604 07/06/21 0552  NA 137  --  134* 136 136 137  K 3.0*  --  4.2 3.6 3.3* 4.1  CL 84*  --  97* 100 102 104  CO2 30  --  '28 27 28 25  '$ GLUCOSE 215*  --  126* 84 107* 87  BUN 28*  --  '18 16 7 '$ <5*  CREATININE 1.15*  --  0.77 0.69 0.68 0.59  CALCIUM 10.6*  --  8.0* 7.6* 7.0* 6.8*  MG  --  1.8  --  1.5* 1.9  --   PHOS 2.9  --   --   --  <1.0* 2.0*     GFR: Estimated Creatinine Clearance: 47.8 mL/min (by C-G formula based on SCr of 0.59 mg/dL).  Liver Function Tests: Recent Labs  Lab 07/02/21 0644 07/04/21 0524 07/05/21 0604 07/06/21 0552  AST 67* 37 30 21  ALT 44 37 36 31  ALKPHOS 67 46 40 43  BILITOT 0.6 0.7 0.5 0.9  PROT 7.2 5.0* 4.5* 4.8*  ALBUMIN 4.3 2.6* 2.1* 2.2*     Recent Labs  Lab 07/02/21 0644  LIPASE 30     Coagulation Profile: Recent Labs  Lab 07/02/21 1304  INR 1.1        Recent Results (from the past 240 hour(s))  SARS CORONAVIRUS 2 (TAT 6-24 HRS) Nasopharyngeal Nasopharyngeal Swab     Status: None   Collection Time: 07/02/21  1:50 PM   Specimen: Nasopharyngeal Swab  Result Value Ref Range Status   SARS Coronavirus 2 NEGATIVE NEGATIVE Final    Comment: (NOTE) SARS-CoV-2 target  nucleic acids are NOT DETECTED.  The SARS-CoV-2 RNA is generally detectable in upper and lower respiratory specimens during the acute phase of infection. Negative results do not preclude SARS-CoV-2 infection, do not rule out co-infections with other pathogens, and should not be used as the sole basis for treatment or other patient management decisions. Negative results must be combined with clinical observations, patient history, and epidemiological information. The expected result is Negative.  Fact Sheet for Patients: SugarRoll.be  Fact Sheet for Healthcare Providers: https://www.woods-mathews.com/  This test is not yet approved or cleared by the [place] FDA and  has been authorized for detection and/or diagnosis of SARS-CoV-2 by FDA under an Emergency Use Authorization (EUA). This EUA will remain  in effect (meaning this test can be used) for the duration of the COVID-19 declaration under Se ction 564(b)(1) of the Act, 21 U.S.C. section 360bbb-3(b)(1), unless the authorization is terminated or revoked sooner.  Performed at [facility], [place] [address] [place], [place] [address]   MRSA Next Gen by PCR, Nasal     Status: None   Collection Time: 07/02/21  8:26 PM   Specimen: Nasal Mucosa; Nasal Swab  Result Value Ref Range Status   MRSA by PCR Next Gen NOT DETECTED NOT DETECTED Final    Comment: (NOTE) The GeneXpert MRSA Assay (  FDA approved for NASAL specimens only), is one component of a comprehensive MRSA colonization surveillance program. It is not intended to diagnose MRSA infection nor to guide or monitor treatment for MRSA infections. Test performance is not FDA approved in patients less than 59 years old. Performed at [facility], [address] [place], [place] [address]   Gastrointestinal Panel by PCR , Stool     Status: None   Collection Time: 07/04/21  5:52 AM   Specimen: Stool  Result Value Ref  Range Status   Campylobacter species NOT DETECTED NOT DETECTED Final   Plesimonas shigelloides NOT DETECTED NOT DETECTED Final   Salmonella species NOT DETECTED NOT DETECTED Final   Yersinia enterocolitica NOT DETECTED NOT DETECTED Final   Vibrio species NOT DETECTED NOT DETECTED Final   Vibrio cholerae NOT DETECTED NOT DETECTED Final   Enteroaggregative E coli (EAEC) NOT DETECTED NOT DETECTED Final   Enteropathogenic E coli (EPEC) NOT DETECTED NOT DETECTED Final   Enterotoxigenic E coli (ETEC) NOT DETECTED NOT DETECTED Final   Shiga like toxin producing E coli (STEC) NOT DETECTED NOT DETECTED Final   Shigella/Enteroinvasive E coli (EIEC) NOT DETECTED NOT DETECTED Final   Cryptosporidium NOT DETECTED NOT DETECTED Final   Cyclospora cayetanensis NOT DETECTED NOT DETECTED Final   Entamoeba histolytica NOT DETECTED NOT DETECTED Final   Giardia lamblia NOT DETECTED NOT DETECTED Final   Adenovirus F40/41 NOT DETECTED NOT DETECTED Final   Astrovirus NOT DETECTED NOT DETECTED Final   Norovirus GI/GII NOT DETECTED NOT DETECTED Final   Rotavirus A NOT DETECTED NOT DETECTED Final   Sapovirus (I, II, IV, and V) NOT DETECTED NOT DETECTED Final    Comment: Performed at [facility], [address] [place], [place] [address]  C Difficile Quick Screen w PCR reflex     Status: None   Collection Time: 07/04/21  5:52 AM   Specimen: STOOL  Result Value Ref Range Status   C Diff antigen NEGATIVE NEGATIVE Final   C Diff toxin NEGATIVE NEGATIVE Final   C Diff interpretation No C. difficile detected.  Final    Comment: Performed at [facility], [address] [place], [place] [address]       Radiology Studies: CARDIAC CATHETERIZATION  Result Date: 07/05/2021   Prox RCA to Mid RCA lesion is 25% stenosed.   Ost Cx to Prox Cx lesion is 25% stenosed.   LV end diastolic pressure is moderately elevated.   There is no aortic valve stenosis. Conclusion Mild non-obstructive coronary  artery disease. Moderately elevated LVEDP of 22 mmHg, with systemic hypotension. Recommendations: Medical management of Stress cardiomyopathy (Takotsubo). Initiation of beta blocker and ACE/ARB as BP allows. Aspirin 81 mg indefinitely Aggressive secondary prevention       LOS: 4 days   Kourosh Jabl[employer] on [email or web address]  07/06/2021, 11:53 AM

## 2021-07-06 NOTE — Progress Notes (Signed)
Patient transferred to room 238.  Family notified.  Report given to [facility], Therapist, sports.

## 2021-07-06 NOTE — Progress Notes (Signed)
Carla Antigua, Carla Martinez [address], [place], [place], [place], [address] [address], [place], [place], [place], [address] Phone: [phone]  Fax: [phone]   Subjective:  Patient laying in bed comfortably.  Reports a loose bowel movement this morning.  The one this morning was not documented under flowsheets, the last one documented under flowsheets was at 1458 yesterday that was type VII, brown.  Patient also reports an episode of emesis this morning.  Objective: Exam: Vital signs in last 24 hours: Vitals:   07/05/21 2000 07/06/21 0000 07/06/21 0400 07/06/21 0850  BP: 104/71 (!) 104/56 103/68 103/68  Pulse: (!) 105 (!) 108 96 (!) 108  Resp: (!) 32 (!) 27 (!) 23 (!) 26  Temp: 98.6 F (37 C)  98.3 F (36.8 C) 98 F (36.7 C)  TempSrc:   Oral Oral  SpO2: 99% 100% 100% 97%  Weight:      Height:       Weight change:   Intake/Output Summary (Last 24 hours) at 07/06/2021 1118 Last data filed at 07/06/2021 0900 Gross per 24 hour  Intake 1028.66 ml  Output 1003 ml  Net 25.66 ml    Constitutional: General:   Alert,  Well-developed, well-nourished, pleasant and cooperative in NAD BP 103/68 (BP Location: Left Arm)   Pulse (!) 108   Temp 98 F (36.7 C) (Oral)   Resp (!) 26   Ht '5\' 3"'$  (1.6 m)   Wt 39 kg   SpO2 97%   BMI 15.23 kg/m   Eyes:  Sclera clear, no icterus.   Conjunctiva pink.   Ears:  No scars, lesions or masses, Normal auditory acuity. Nose:  No deformity, discharge, or lesions. Mouth:  No deformity or lesions, oropharynx pink & moist.  Neck:  Supple; no masses, trachea midline  Respiratory: Normal respiratory effort  Gastrointestinal:  Soft, non-tender, mildly distended, without masses, hepatosplenomegaly or hernias noted.  No guarding or rebound tenderness.     Cardiac: No clubbing or edema.  No cyanosis. Normal posterior tibial pedal pulses noted.  Lymphatic:  No significant cervical adenopathy.  Psych:  Alert and cooperative. Normal  mood and affect.  Musculoskeletal:  Head normocephalic, atraumatic. 5/5 Lower extremity strength bilaterally.  Skin: Warm. Intact without significant lesions or rashes. No jaundice.  Neurologic:  Face symmetrical, tongue midline, Normal sensation to touch  Psych:  Alert and oriented x3, Alert and cooperative. Normal mood and affect.   Lab Results: Lab Results  Component Value Date   WBC 11.3 (H) 07/06/2021   HGB 10.8 (L) 07/06/2021   HCT 31.0 (L) 07/06/2021   MCV 97.8 07/06/2021   PLT 257 07/06/2021   Micro Results: Recent Results (from the past 240 hour(s))  SARS CORONAVIRUS 2 (TAT 6-24 HRS) Nasopharyngeal Nasopharyngeal Swab     Status: None   Collection Time: 07/02/21  1:50 PM   Specimen: Nasopharyngeal Swab  Result Value Ref Range Status   SARS Coronavirus 2 NEGATIVE NEGATIVE Final    Comment: (NOTE) SARS-CoV-2 target nucleic acids are NOT DETECTED.  The SARS-CoV-2 RNA is generally detectable in upper and lower respiratory specimens during the acute phase of infection. Negative results do not preclude SARS-CoV-2 infection, do not rule out co-infections with other pathogens, and should not be used as the sole basis for treatment or other patient management decisions. Negative results must be combined with clinical observations, patient history, and epidemiological information. The expected result is Negative.  Fact Sheet for Patients: SugarRoll.be  Fact Sheet for Healthcare Providers:  https://www.woods-mathews.com/  This test is not yet approved or cleared by the [place] and  has been authorized for detection and/or diagnosis of SARS-CoV-2 by FDA under an Emergency Use Authorization (EUA). This EUA will remain  in effect (meaning this test can be used) for the duration of the COVID-19 declaration under Se ction 564(b)(1) of the Act, 21 U.S.C. section 360bbb-3(b)(1), unless the authorization is terminated  or revoked sooner.  Performed at [facility], [place] [address], [place], [place] [address]   MRSA Next Gen by PCR, Nasal     Status: None   Collection Time: 07/02/21  8:26 PM   Specimen: Nasal Mucosa; Nasal Swab  Result Value Ref Range Status   MRSA by PCR Next Gen NOT DETECTED NOT DETECTED Final    Comment: (NOTE) The GeneXpert MRSA Assay (FDA approved for NASAL specimens only), is one component of a comprehensive MRSA colonization surveillance program. It is not intended to diagnose MRSA infection nor to guide or monitor treatment for MRSA infections. Test performance is not FDA approved in patients less than 67 years old. Performed at [facility], [address] [place], [place] [address]   Gastrointestinal Panel by PCR , Stool     Status: None   Collection Time: 07/04/21  5:52 AM   Specimen: Stool  Result Value Ref Range Status   Campylobacter species NOT DETECTED NOT DETECTED Final   Plesimonas shigelloides NOT DETECTED NOT DETECTED Final   Salmonella species NOT DETECTED NOT DETECTED Final   Yersinia enterocolitica NOT DETECTED NOT DETECTED Final   Vibrio species NOT DETECTED NOT DETECTED Final   Vibrio cholerae NOT DETECTED NOT DETECTED Final   Enteroaggregative E coli (EAEC) NOT DETECTED NOT DETECTED Final   Enteropathogenic E coli (EPEC) NOT DETECTED NOT DETECTED Final   Enterotoxigenic E coli (ETEC) NOT DETECTED NOT DETECTED Final   Shiga like toxin producing E coli (STEC) NOT DETECTED NOT DETECTED Final   Shigella/Enteroinvasive E coli (EIEC) NOT DETECTED NOT DETECTED Final   Cryptosporidium NOT DETECTED NOT DETECTED Final   Cyclospora cayetanensis NOT DETECTED NOT DETECTED Final   Entamoeba histolytica NOT DETECTED NOT DETECTED Final   Giardia lamblia NOT DETECTED NOT DETECTED Final   Adenovirus F40/41 NOT DETECTED NOT DETECTED Final   Astrovirus NOT DETECTED NOT DETECTED Final   Norovirus GI/GII NOT DETECTED NOT DETECTED Final   Rotavirus A NOT  DETECTED NOT DETECTED Final   Sapovirus (I, II, IV, and V) NOT DETECTED NOT DETECTED Final    Comment: Performed at [facility], [address] [place], [place] [address]  C Difficile Quick Screen w PCR reflex     Status: None   Collection Time: 07/04/21  5:52 AM   Specimen: STOOL  Result Value Ref Range Status   C Diff antigen NEGATIVE NEGATIVE Final   C Diff toxin NEGATIVE NEGATIVE Final   C Diff interpretation No C. difficile detected.  Final    Comment: Performed at [facility], [address] [place], [place] [address]   Studies/Results: CARDIAC CATHETERIZATION  Result Date: 07/05/2021   Prox RCA to Mid RCA lesion is 25% stenosed.   Ost Cx to Prox Cx lesion is 25% stenosed.   LV end diastolic pressure is moderately elevated.   There is no aortic valve stenosis. Conclusion Mild non-obstructive coronary artery disease. Moderately elevated LVEDP of 22 mmHg, with systemic hypotension. Recommendations: Medical management of Stress cardiomyopathy (Takotsubo). Initiation of beta blocker and ACE/ARB as BP allows.  Aspirin 81 mg indefinitely Aggressive secondary prevention   Medications:  Scheduled Meds:  amitriptyline  25 mg Oral QHS   aspirin EC  81 mg Oral Daily   busPIRone  10 mg Oral TID   Chlorhexidine Gluconate Cloth  6 each Topical Q0600   enoxaparin (LOVENOX) injection  40 mg Subcutaneous [id number]   folic acid  1 mg Oral Daily   lisinopril  2.5 mg Oral Daily   magnesium oxide  400 mg Oral Daily   mouth rinse  15 mL Mouth Rinse BID   metoprolol succinate  12.5 mg Oral Daily   midodrine  10 mg Oral TID WC   multivitamin with minerals  1 tablet Oral Daily   nicotine  14 mg Transdermal Daily   pantoprazole  40 mg Oral BID   polyethylene glycol-electrolytes  4,000 mL Oral Once   QUEtiapine  100 mg Oral TID   sodium chloride flush  3 mL Intravenous Q12H   sodium chloride flush  3 mL Intravenous Q12H   thiamine  100 mg Oral Daily   Or   thiamine  100 mg  Intravenous Daily   traZODone  50 mg Oral QHS   vitamin B-12  1,000 mcg Oral Daily   Continuous Infusions:  sodium chloride     ampicillin-sulbactam (UNASYN) IV 3 g (07/06/21 0851)   potassium PHOSPHATE IVPB (in mmol) 30 mmol (07/06/21 1050)   promethazine (PHENERGAN) injection (IM or IVPB) Stopped (07/03/21 1531)   PRN Meds:.sodium chloride, haloperidol lactate, hydrOXYzine, loperamide, morphine injection, ondansetron **OR** ondansetron (ZOFRAN) IV, oxyCODONE, promethazine (PHENERGAN) injection (IM or IVPB), sodium chloride flush   Assessment: Principal Problem:   Adjustment disorder with mixed anxiety and depressed mood Active Problems:   Mild recurrent major depression (HCC)   Acid reflux   Osteoporosis   History of bowel resection   Rectal prolapse   Diarrhea   Enteritis   Anorexia   Alcohol use   Anxiety   Insomnia   Coffee ground emesis   Atrial fibrillation with RVR (HCC)   Complicated grief   Grief at loss of child   Hypotension, chronic   Pressure injury of skin   Alcohol abuse   Generalized anxiety disorder    Plan: Patient only received 1 dose of Imodium yesterday, and did not receive any further since then.  Discussed with the patient nurse, to try and give her Imodium every time she has a loose bowel movement.  I discussed the patient with Dr. Producer, television/film/video, in regard to the risks of endoscopic procedures in this patient.  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  However, if they have to be done that could be a possibility with certain risks.  Patient's heart rate was also elevated today, and her medications are being optimized by cardiology.  Given the above discussion, will opt for conservative management at this time given high risks of endoscopic procedures  With enteritis reported on  recent CT scan, I will obtain enterography to evaluate this further as this may allow for noninvasive evaluation of her symptoms.  If symptoms do not improve with Imodium use, another option would be possible flexible sigmoidoscopy, unsedated, with enema prep, with possible colon biopsies.  However, this would not allow for evaluation of the terminal ileum or stomach for her nausea vomiting symptoms  She only had 1 episode of emesis  in the last few days and is overall tolerating her oral diet.  We will continue to follow   LOS: 4 days   Carla Antigua, Carla Martinez 07/06/2021, 11:18 AM

## 2021-07-07 ENCOUNTER — Inpatient Hospital Stay: Payer: No Typology Code available for payment source

## 2021-07-07 DIAGNOSIS — F19239 Other psychoactive substance dependence with withdrawal, unspecified: Secondary | ICD-10-CM | POA: Diagnosis not present

## 2021-07-07 DIAGNOSIS — I9589 Other hypotension: Secondary | ICD-10-CM | POA: Diagnosis not present

## 2021-07-07 DIAGNOSIS — I5022 Chronic systolic (congestive) heart failure: Secondary | ICD-10-CM | POA: Diagnosis not present

## 2021-07-07 DIAGNOSIS — K529 Noninfective gastroenteritis and colitis, unspecified: Secondary | ICD-10-CM | POA: Diagnosis not present

## 2021-07-07 LAB — CBC
HCT: 30.9 % — ABNORMAL LOW (ref 36.0–46.0)
Hemoglobin: 10.4 g/dL — ABNORMAL LOW (ref 12.0–15.0)
MCH: 32.5 pg (ref 26.0–34.0)
MCHC: 33.7 g/dL (ref 30.0–36.0)
MCV: 96.6 fL (ref 80.0–100.0)
Platelets: 280 10*3/uL (ref 150–400)
RBC: 3.2 MIL/uL — ABNORMAL LOW (ref 3.87–5.11)
RDW: 13.4 % (ref 11.5–15.5)
WBC: 9.8 10*3/uL (ref 4.0–10.5)
nRBC: 0 % (ref 0.0–0.2)

## 2021-07-07 LAB — COMPREHENSIVE METABOLIC PANEL
ALT: 23 U/L (ref 0–44)
AST: 16 U/L (ref 15–41)
Albumin: 2.2 g/dL — ABNORMAL LOW (ref 3.5–5.0)
Alkaline Phosphatase: 48 U/L (ref 38–126)
Anion gap: 8 (ref 5–15)
BUN: <5 mg/dL — ABNORMAL LOW (ref 6–20)
CO2: 24 mmol/L (ref 22–32)
Calcium: 7 mg/dL — ABNORMAL LOW (ref 8.9–10.3)
Chloride: 105 mmol/L (ref 98–111)
Creatinine, Ser: 0.63 mg/dL (ref 0.44–1.00)
GFR, Estimated: >60 mL/min (ref >60)
Glucose, Bld: 103 mg/dL — ABNORMAL HIGH (ref 70–99)
Potassium: 4.5 mmol/L (ref 3.5–5.1)
Sodium: 137 mmol/L (ref 135–145)
Total Bilirubin: 0.6 mg/dL (ref 0.3–1.2)
Total Protein: 5.1 g/dL — ABNORMAL LOW (ref 6.5–8.1)

## 2021-07-07 NOTE — Evaluation (Signed)
Physical Therapy Evaluation Patient Details Name: Carla Martinez MRN: [id number] DOB: 12-31-1960 Today's Date: 07/07/2021  History of Present Illness  Per chart: 61 y.o. female with medical history significant for depression, anxiety, insomnia, history of bowel resection, anorexia, mild recurrent major depression, GERD, baseline hypotension, who presented to the emergency department for chief concerns of nausea, vomiting, diarrhea.  Patient has been mourning death of daughter in 03/11/21, and reportedly drinking alcohol and doing other recreational drugs including taking NyQuil.  Pt was supposed to undergo EGD and colonoscopy and was going through her bowel prep when she developed nausea & vomiting, which prompted admission to hospital.  This was followed by alteration in mental status changes, elevated troponin [id number] ), ongoing chest pain with nonspecific ST-T wave abnormalities on ECG. Cardiac catheterization 07/05/2021 revealed insignificant coronary artery disease consistent with Takotsubo's cardiomyopathy.  Clinical Impression  Pt is a pleasant 60 y/o female greeted while supine in bed at start of PT examination with family present (her mother and her son).  Pt earlier declined PT but is now agreeable to session. Pt is alert/awake and cognition appears WNL. Pt with DOE throughout PT session, but when instructed in pursed lip breathing technique reported improvement in sx and was able to continue mobility/gait interventions.  Pt was on room air and SPO2 ranged from 88% to >90% throughout session. HR achieved a max of approx 115 bpm with activity. PT provided CGA for majority of examination d/t DOE, although pt does demo ability to balance without UE in standing. Pt reports she was independent with ADLs, gait prior to hospitalization. She is not yet at PLOF, and utilized RW during examination, ambulating a total of 40 ft in room. After ambulation pt requested to lie down in hospital bed due  to fatigue, despite PT education regarding benefits of sitting in chair. Pt's mother and son do live with pt and her mother is able to help her 24/7. Pt will need to be able to ascend 19 steps to enter her apartment. The pt will benefit from further skilled PT to improve strength, gait and overall functional mobility to return to PLOF.      Recommendations for follow up therapy are one component of a multi-disciplinary discharge planning process, led by the attending physician.  Recommendations may be updated based on patient status, additional functional criteria and insurance authorization.  Follow Up Recommendations Home health PT    Equipment Recommendations  Standard walker    Recommendations for Other Services       Precautions / Restrictions Precautions Precautions: Fall Precaution Comments: Pt reports she becomes SOB upon standing, feels unsteady, reports she went to use restroom indep. by herself earlier today 2x, but said it "felt scary" and that she almost stumbled. PT instructed pt to use callbell to contact nursing to assist her to restroom in future. Pt verbalized understanding. Restrictions Weight Bearing Restrictions: No      Mobility  Bed Mobility Overal bed mobility: Modified Independent             General bed mobility comments: Pt mod I with bed mobility, does reported some SOB, denies dizziness SPO2% when seated at EOB 88-92%, with instruction for breathing technique increased to 94%    Transfers Overall transfer level: Needs assistance Equipment used: Rolling walker (2 wheeled) Transfers: Sit to/from Stand Sit to Stand: Min guard         General transfer comment: Min guard as pt previously dizzy with OOB mobility per note with  OT. Pt denies dizziness currently, but reports feeling SOB.  SPO2% remains 89-92%. HR remains approx 101  Ambulation/Gait Ambulation/Gait assistance: Min guard Gait Distance (Feet): 40 Feet Assistive device: Rolling walker  (2 wheeled) Gait Pattern/deviations: Step-through pattern;Decreased step length - left;Decreased step length - right Gait velocity: observed to be decreased   General Gait Details: crouched gait, decreased speed, reports fearfull, reports holding breath, DOE. When instructed in breathing technique pt able to continue ambulating.  Stairs            Wheelchair Mobility    Modified Rankin (Stroke Patients Only)       Balance Overall balance assessment: Needs assistance Sitting-balance support: No upper extremity supported;Feet supported Sitting balance-Leahy Scale: Normal Sitting balance - Comments: Normal sitting balance reaching outside BOS to don shoes   Standing balance support: No upper extremity supported;During functional activity Standing balance-Leahy Scale: Fair Standing balance comment: Pt was able to demo standing without BUE support with no decrease in postural stability, continues to report no dizziness                             Pertinent Vitals/Pain Pain Assessment: No/denies pain    Home Living Family/patient expects to be discharged to:: Private residence Living Arrangements: Parent;Children Available Help at Discharge: Family;Available 24 hours/day (Son has a disability. Mother available 24/7) Type of Home: Apartment Home Access: Stairs to enter Entrance Stairs-Rails: Psychiatric nurse of Steps: full flight Home Layout: One level Home Equipment: None Additional Comments: Son's wheelchair, pt thinks she might have RW at home    Prior Function Level of Independence: Independent               Hand Dominance   Dominant Hand: Right    Extremity/Trunk Assessment   Upper Extremity Assessment Upper Extremity Assessment: Defer to OT evaluation    Lower Extremity Assessment Lower Extremity Assessment: Generalized weakness    Cervical / Trunk Assessment Cervical / Trunk Assessment: Kyphotic  Communication    Communication: No difficulties  Cognition Arousal/Alertness: Awake/alert Behavior During Therapy: WFL for tasks assessed/performed;Anxious Overall Cognitive Status: Within Functional Limits for tasks assessed                                 General Comments: Alert and oriented to self, place, and some aspects of situation. Continues to require mild encouragement to participate in OOB mobility      General Comments General comments (skin integrity, edema, etc.): After instructed in breathing technique pt able to ambulate for a total of 40 ft (pt did stop to use restroom, had BM). First pt ambulated to chair from bed with CGA and use of RW. Pt sat in chair for rest break. Pt then ambulated to use bathroom. After using bathroom pt ambulated with RW, CGA back to hospital bed. SPO2% taken after ambulating and remained in 90s, eventually >95%.    Exercises Other Exercises Other Exercises: See mobility comments for detail. Pt ambulated with RW, CGA from hospital bed>chair>bathroom>hospital bed. Taking 2 rest breaks.   Assessment/Plan    PT Assessment Patient needs continued PT services  PT Problem List Decreased strength;Decreased activity tolerance;Decreased balance;Decreased mobility;Cardiopulmonary status limiting activity       PT Treatment Interventions DME instruction;Gait training;Stair training;Functional mobility training;Therapeutic activities;Therapeutic exercise;Balance training;Neuromuscular re-education;Patient/family education;Modalities    PT Goals (Current goals can be found in the Care Plan section)  Acute Rehab PT Goals Patient Stated Goal: to take a bath    Frequency Min 2X/week   Barriers to discharge   Pt reports 19 steps to enter apartment.    Co-evaluation               AM-PAC PT "6 Clicks" Mobility  Outcome Measure Help needed turning from your back to your side while in a flat bed without using bedrails?: None Help needed moving from  lying on your back to sitting on the side of a flat bed without using bedrails?: None Help needed moving to and from a bed to a chair (including a wheelchair)?: A Little Help needed standing up from a chair using your arms (e.g., wheelchair or bedside chair)?: A Little Help needed to walk in hospital room?: A Little Help needed climbing 3-5 steps with a railing? : A Lot 6 Click Score: 19    End of Session Equipment Utilized During Treatment: Gait belt Activity Tolerance: Patient tolerated treatment well;Other (comment) (pt reports DOE, but when instructed with breathing technique was able to continue ambulation) Patient left: in bed;with call bell/phone within reach Nurse Communication: Mobility status PT Visit Diagnosis: Other abnormalities of gait and mobility (R26.89);Muscle weakness (generalized) (M62.81);Adult, failure to thrive (R62.7);Unsteadiness on feet (R26.81)    Time: [id number] PT Time Calculation (min) (ACUTE ONLY): 33 min   Charges:   PT Evaluation $PT Eval Low Complexity: 1 Low PT Treatments $Gait Training: 8-22 mins        Ricard Dillon PT, DPT   Zollie Pee 07/07/2021, 2:20 PM

## 2021-07-07 NOTE — Progress Notes (Signed)
[facility] PROGRESS NOTE   Daphanie Caler [id number] DOB: 1961/03/23 DOA: 07/02/2021  PCP: [name], MD  Brief History/Interval Summary: 60 y.o. female with medical history significant for depression, anxiety, insomnia, history of bowel resection, anorexia, mild recurrent major depression, GERD, baseline hypotension, who presented to the emergency department for chief concerns of nausea, vomiting, diarrhea.  Patient has been very depressed and tearful since May 2022 when her eldest daughter passed away.  Apparently has been drinking alcohol and doing other recreational drugs including taking NyQuil.  Was supposed to undergo EGD and colonoscopy and was going through her bowel prep when she developed nausea vomiting.  This prompted admission to the hospital.  This was followed by alteration in mental status changes and agitation requiring transfer to the ICU and was placed on Precedex.    Consultants: Cardiology.  Gastroenterology  Procedures: Underwent cardiac catheterization on 9/16.  Antibiotics: Anti-infectives (From admission, onward)    Start     Dose/Rate Route Frequency Ordered Stop   07/06/21 1300  amoxicillin-clavulanate (AUGMENTIN) 875-125 MG per tablet 1 tablet        1 tablet Oral Every 12 hours 07/06/21 1205     07/03/21 1400  Ampicillin-Sulbactam (UNASYN) 3 g in sodium chloride 0.9 % 100 mL IVPB  Status:  Discontinued        3 g 200 mL/hr over 30 Minutes Intravenous Every 6 hours 07/03/21 1045 07/06/21 1205   07/02/21 1400  cefTRIAXone (ROCEPHIN) 1 g in sodium chloride 0.9 % 100 mL IVPB  Status:  Discontinued        1 g 200 mL/hr over 30 Minutes Intravenous Every 24 hours 07/02/21 1342 07/02/21 1352   07/02/21 1400  metroNIDAZOLE (FLAGYL) IVPB 500 mg  Status:  Discontinued        500 mg 100 mL/hr over 60 Minutes Intravenous Every 12 hours 07/02/21 1342 07/03/21 0955   07/02/21 1400  cefTRIAXone (ROCEPHIN) 2 g in sodium chloride 0.9 % 100 mL IVPB         2 g 200 mL/hr over 30 Minutes Intravenous  Once 07/02/21 1351 07/02/21 1447       Subjective/Interval History: Patient seems to be in better spirits this morning.  Mentions that she gets chest tightness and some shortness of breath mainly with exertion.  Overall she is feeling better.  She is tolerating the BuSpar.  Continues to have 2-3 episodes of diarrhea every day.  Continues to feel that her abdomen is distended.    Assessment/Plan:  Acute metabolic encephalopathy/drug withdrawal/alcohol withdrawal Concern was for withdrawal from alcohol and other illicit recreational drugs that she had been doing recently.   Due to significant agitation she had to be placed on Precedex infusion.  No focal neurological deficits were noted.  She did develop hypotension as a result of this medication.  She was started back on her trazodone at a much lower dose along with her amitriptyline and Seroquel.  We are limited due to prolonged QT.  Will not increase the dose of these medications any further.  EKG done on 9/17 does not show significantly prolonged QT which is reassuring.   Urine drug screen positive for amphetamines, cannabinoid, phencyclidine as well as tricyclic.  Hypotension Based on previous records patient does have chronic hypotension.  This was made worse by Precedex infusion.  Started on midodrine.   Blood pressures have stabilized.  Hypotension most likely due to Precedex.  Procalcitonin was 0.27.  Lactic acid level was only 2.0.  Remains stable.  Seems to have tolerated addition of low-dose ACE inhibitor and beta-blocker.  Chronic systolic CHF/NSTEMI Echocardiogram showed EF to be 25 to 30% which is new for this patient.  Cardiology is following.  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  Patient going through grief as she lost her daughter recently.   Due to persistent chest tightness and shortness of breath she underwent cardiac catheterization which did not show any significant coronary artery  disease.   Patient started on aspirin, low-dose ACE inhibitor and low-dose beta-blocker.  Seems to be tolerating these medications well so far.  Continue to monitor blood pressures.  No further cardiac diagnostics planned at this time.  Follow-up with cardiology in 1 to 2 weeks.    Atrial fibrillation with RVR Converted to sinus rhythm after she was given diltiazem.  Seen by cardiology.  She was placed on heparin infusion initially.  Now on aspirin per cardiology.  TSH 0.75.  Free T4 1.15.  Echocardiogram shows low EF 25 to 30%.  See above.  Nausea vomiting and diarrhea/concern for GI bleed/gastroenteritis There was some concern for coffee-ground emesis.  Drop in hemoglobin is likely dilutional.  Patient has been taking increased amount of ibuprofen along with the use of alcohol recently.  Initially placed on Protonix infusion.  No overt bleeding has been noted.  Changed to twice a day Protonix.   Continues to have loose stools.  C. difficile and GI pathogen panel unremarkable.  Gastroenterology is following.  Initially EGD and colonoscopy was being considered since patient has had the symptoms longstanding and was being followed by gastroenterology in the outpatient setting.  However due to her new cardiac issues endoscopies could have moderate to high risk of cardiac complications so they are being deferred for now. Defer further management to gastroenterology.  Normocytic anemia Mild drop in hemoglobin likely dilutional.  No overt bleeding noted.  Hemoglobin is stable.  Aspiration pneumonia/acute respiratory failure with hypoxia Chest x-ray showed right-sided opacity.  She could have aspirated.  Respiratory status is stable.  It looks like she has been weaned off of oxygen.  She was placed on Unasyn.  She was changed over to Augmentin and plan for a 7-day course which will end on 9/21.    Hypodense lesion in the liver with peripheral nodular enhancement CT scan suggest that this could be  hemangioma.  Will need outpatient follow-up.  Hepatitis panel unremarkable  Hypokalemia/hypomagnesemia Repleted.  History of alcohol abuse On CIWA protocol.  Noted to be stable.  Improved.  History of depression and anxiety Severe anxiety also triggered recently by the loss of her daughter.  Patient seems to be quite anxious.   Patient seen by psychiatry.  They do not recommend increasing the dose of either her Seroquel or trazodone at this time.  They offered her BuSpar which she initially declined.   Rationale for not being able to use alternative agents due to her QT interval explained to the patient again and then she was willing to try BuSpar.  This was initiated yesterday which she seems to be tolerating well.  Mood seems to be better today.  Stage I sacral decubitus Pressure Injury 07/02/21 Sacrum Medial Stage 1 -  Intact skin with non-blanchable redness of a localized area usually over a bony prominence. skin intact, pink, non-blanchable redness (Active)  07/02/21 2030  Location: Sacrum  Location Orientation: Medial  Staging: Stage 1 -  Intact skin with non-blanchable redness of a localized area usually over a bony prominence.  Wound Description (Comments):  skin intact, pink, non-blanchable redness  Present on Admission: Yes     DVT Prophylaxis: Lovenox Code Status: DNR Family Communication: No family at bedside Disposition Plan: Hopefully return home when improved.  Start mobilizing.  Status is: Inpatient  Remains inpatient appropriate because:Altered mental status, Ongoing diagnostic testing needed not appropriate for outpatient work up, and IV treatments appropriate due to intensity of illness or inability to take PO  Dispo: The patient is from: Home              Anticipated d/c is to: Home              Patient currently is not medically stable to d/c.   Difficult to place patient No       Medications: Scheduled:  amitriptyline  25 mg Oral QHS    amoxicillin-clavulanate  1 tablet Oral Q12H   aspirin EC  81 mg Oral Daily   busPIRone  10 mg Oral TID   Chlorhexidine Gluconate Cloth  6 each Topical Q0600   enoxaparin (LOVENOX) injection  40 mg Subcutaneous [id number]   folic acid  1 mg Oral Daily   lisinopril  2.5 mg Oral Daily   magnesium oxide  400 mg Oral Daily   mouth rinse  15 mL Mouth Rinse BID   metoprolol succinate  12.5 mg Oral Daily   midodrine  10 mg Oral TID WC   multivitamin with minerals  1 tablet Oral Daily   nicotine  14 mg Transdermal Daily   pantoprazole  40 mg Oral BID   polyethylene glycol-electrolytes  4,000 mL Oral Once   QUEtiapine  100 mg Oral TID   silver sulfADIAZINE   Topical BID   sodium chloride flush  3 mL Intravenous Q12H   sodium chloride flush  3 mL Intravenous Q12H   thiamine  100 mg Oral Daily   Or   thiamine  100 mg Intravenous Daily   traZODone  50 mg Oral QHS   vitamin B-12  1,000 mcg Oral Daily   Continuous:  sodium chloride     promethazine (PHENERGAN) injection (IM or IVPB) Stopped (07/03/21 1531)   [id number] chloride, haloperidol lactate, hydrOXYzine, loperamide, morphine injection, ondansetron **OR** ondansetron (ZOFRAN) IV, oxyCODONE, promethazine (PHENERGAN) injection (IM or IVPB), sodium chloride flush   Objective:  Vital Signs  Vitals:   07/06/21 2000 07/07/21 0000 07/07/21 0400 07/07/21 0808  BP: (!) 83/60 (!) '86/58 93/69 90/63 '$  Pulse: 93 83 83 86  Resp: 20  (!) 21 15  Temp: 98 F (36.7 C) 98.6 F (37 C) 98.2 F (36.8 C) 98.7 F (37.1 C)  TempSrc: Oral Oral Oral Oral  SpO2: 100% 100% 100% 100%  Weight:      Height:        Intake/Output Summary (Last 24 hours) at 07/07/2021 1110 Last data filed at 07/07/2021 1008 Gross per 24 hour  Intake 1215.18 ml  Output --  Net 1215.18 ml    Filed Weights   07/02/21 0633 07/05/21 1010 07/06/21 1610  Weight: 39 kg 39 kg 47.3 kg    General appearance: Awake alert.  In no distress Resp: Clear to auscultation bilaterally.   Normal effort Cardio: S1-S2 is normal regular.  No S3-S4.  No rubs murmurs or bruit GI: Abdomen is soft.  Nontender nondistended.  Bowel sounds are present normal.  No masses organomegaly Extremities: No edema.  Full range of motion of lower extremities. Neurologic: Alert and oriented x3.  No focal neurological deficits.  Lab Results:  Data Reviewed: I have personally reviewed following labs and imaging studies  CBC: Recent Labs  Lab 07/02/21 0644 07/03/21 0518 07/04/21 0524 07/05/21 0604 07/06/21 0552 07/07/21 0431  WBC 16.3* 18.3* 13.5* 9.9 11.3* 9.8  NEUTROABS 14.3*  --   --   --   --   --   HGB 13.0 10.3* 10.5* 9.9* 10.8* 10.4*  HCT 37.1 29.4* 29.8* 29.1* 31.0* 30.9*  MCV 94.2 96.7 98.3 96.0 97.8 96.6  PLT 428* 252 243 246 257 280     Basic Metabolic Panel: Recent Labs  Lab 07/02/21 0644 07/02/21 0906 07/03/21 0518 07/04/21 0524 07/05/21 0604 07/06/21 0552 07/07/21 0431  NA 137  --  134* 136 136 137 137  K 3.0*  --  4.2 3.6 3.3* 4.1 4.5  CL 84*  --  97* 100 102 104 105  CO2 30  --  '28 27 28 25 24  '$ GLUCOSE 215*  --  126* 84 107* 87 103*  BUN 28*  --  '18 16 7 '$ <5* <5*  CREATININE 1.15*  --  0.77 0.69 0.68 0.59 0.63  CALCIUM 10.6*  --  8.0* 7.6* 7.0* 6.8* 7.0*  MG  --  1.8  --  1.5* 1.9  --   --   PHOS 2.9  --   --   --  <1.0* 2.0*  --      GFR: Estimated Creatinine Clearance: 57.9 mL/min (by C-G formula based on SCr of 0.63 mg/dL).  Liver Function Tests: Recent Labs  Lab 07/02/21 0644 07/04/21 0524 07/05/21 0604 07/06/21 0552 07/07/21 0431  AST 67* 37 '30 21 16  '$ ALT 44 37 36 31 23  ALKPHOS 67 46 40 43 48  BILITOT 0.6 0.7 0.5 0.9 0.6  PROT 7.2 5.0* 4.5* 4.8* 5.1*  ALBUMIN 4.3 2.6* 2.1* 2.2* 2.2*     Recent Labs  Lab 07/02/21 0644  LIPASE 30     Coagulation Profile: Recent Labs  Lab 07/02/21 1304  INR 1.1        Recent Results (from the past 240 hour(s))  SARS CORONAVIRUS 2 (TAT 6-24 HRS) Nasopharyngeal Nasopharyngeal Swab      Status: None   Collection Time: 07/02/21  1:50 PM   Specimen: Nasopharyngeal Swab  Result Value Ref Range Status   SARS Coronavirus 2 NEGATIVE NEGATIVE Final    Comment: (NOTE) SARS-CoV-2 target nucleic acids are NOT DETECTED.  The SARS-CoV-2 RNA is generally detectable in upper and lower respiratory specimens during the acute phase of infection. Negative results do not preclude SARS-CoV-2 infection, do not rule out co-infections with other pathogens, and should not be used as the sole basis for treatment or other patient management decisions. Negative results must be combined with clinical observations, patient history, and epidemiological information. The expected result is Negative.  Fact Sheet for Patients: SugarRoll.be  Fact Sheet for Healthcare Providers: https://www.woods-mathews.com/  This test is not yet approved or cleared by the [place] FDA and  has been authorized for detection and/or diagnosis of SARS-CoV-2 by FDA under an Emergency Use Authorization (EUA). This EUA will remain  in effect (meaning this test can be used) for the duration of the COVID-19 declaration under Se ction 564(b)(1) of the Act, 21 U.S.C. section 360bbb-3(b)(1), unless the authorization is terminated or revoked sooner.  Performed at [facility], [place] [address] [place], [place] [address]   MRSA Next Gen by PCR, Nasal     Status: None   Collection Time: 07/02/21  8:26 PM   Specimen: Nasal Mucosa; Nasal Swab  Result Value Ref Range Status   MRSA by PCR Next Gen NOT DETECTED NOT DETECTED Final    Comment: (NOTE) The GeneXpert MRSA Assay (FDA approved for NASAL specimens only), is one component of a comprehensive MRSA colonization surveillance program. It is not intended to diagnose MRSA infection nor to guide or monitor treatment for MRSA infections. Test performance is not FDA approved in patients less than 58 years old. Performed at  [facility], [address] [place], [place] [address]   Gastrointestinal Panel by PCR , Stool     Status: None   Collection Time: 07/04/21  5:52 AM   Specimen: Stool  Result Value Ref Range Status   Campylobacter species NOT DETECTED NOT DETECTED Final   Plesimonas shigelloides NOT DETECTED NOT DETECTED Final   Salmonella species NOT DETECTED NOT DETECTED Final   Yersinia enterocolitica NOT DETECTED NOT DETECTED Final   Vibrio species NOT DETECTED NOT DETECTED Final   Vibrio cholerae NOT DETECTED NOT DETECTED Final   Enteroaggregative E coli (EAEC) NOT DETECTED NOT DETECTED Final   Enteropathogenic E coli (EPEC) NOT DETECTED NOT DETECTED Final   Enterotoxigenic E coli (ETEC) NOT DETECTED NOT DETECTED Final   Shiga like toxin producing E coli (STEC) NOT DETECTED NOT DETECTED Final   Shigella/Enteroinvasive E coli (EIEC) NOT DETECTED NOT DETECTED Final   Cryptosporidium NOT DETECTED NOT DETECTED Final   Cyclospora cayetanensis NOT DETECTED NOT DETECTED Final   Entamoeba histolytica NOT DETECTED NOT DETECTED Final   Giardia lamblia NOT DETECTED NOT DETECTED Final   Adenovirus F40/41 NOT DETECTED NOT DETECTED Final   Astrovirus NOT DETECTED NOT DETECTED Final   Norovirus GI/GII NOT DETECTED NOT DETECTED Final   Rotavirus A NOT DETECTED NOT DETECTED Final   Sapovirus (I, II, IV, and V) NOT DETECTED NOT DETECTED Final    Comment: Performed at [facility], [address] [place], [place] [address]  C Difficile Quick Screen w PCR reflex     Status: None   Collection Time: 07/04/21  5:52 AM   Specimen: STOOL  Result Value Ref Range Status   C Diff antigen NEGATIVE NEGATIVE Final   C Diff toxin NEGATIVE NEGATIVE Final   C Diff interpretation No C. difficile detected.  Final    Comment: Performed at [facility], [address] [place], [place] [address]       Radiology Studies: CARDIAC CATHETERIZATION  Result Date: 07/05/2021   Prox RCA to Mid RCA  lesion is 25% stenosed.   Ost Cx to Prox Cx lesion is 25% stenosed.   LV end diastolic pressure is moderately elevated.   There is no aortic valve stenosis. Conclusion Mild non-obstructive coronary artery disease. Moderately elevated LVEDP of 22 mmHg, with systemic hypotension. Recommendations: Medical management of Stress cardiomyopathy (Takotsubo). Initiation of beta blocker and ACE/ARB as BP allows. Aspirin 81 mg indefinitely Aggressive secondary prevention       LOS: 5 days   Tal Kempker [employer] on [email or web address]  07/07/2021, 11:10 AM

## 2021-07-07 NOTE — Progress Notes (Signed)
[facility] Cardiology  SUBJECTIVE: Patient laying in bed, complains of slight "twinge" in her chest   Vitals:   07/06/21 2000 07/07/21 0000 07/07/21 0400 07/07/21 0808  BP: (!) 83/60 (!) '86/58 93/69 90/63 '$  Pulse: 93 83 83 86  Resp: 20  (!) 21 15  Temp: 98 F (36.7 C) 98.6 F (37 C) 98.2 F (36.8 C) 98.7 F (37.1 C)  TempSrc: Oral Oral Oral Oral  SpO2: 100% 100% 100% 100%  Weight:      Height:         Intake/Output Summary (Last 24 hours) at 07/07/2021 1017 Last data filed at 07/07/2021 1008 Gross per 24 hour  Intake 1215.18 ml  Output --  Net 1215.18 ml      PHYSICAL EXAM  General: Well developed, well nourished, in no acute distress HEENT:  Normocephalic and atramatic Neck:  No JVD.  Lungs: Clear bilaterally to auscultation and percussion. Heart: HRRR . Normal S1 and S2 without gallops or murmurs.  Abdomen: Bowel sounds are positive, abdomen soft and non-tender  Msk:  Back normal, normal gait. Normal strength and tone for age. Extremities: No clubbing, cyanosis or edema.   Neuro: Alert and oriented X 3. Psych:  Good affect, responds appropriately   LABS: Basic Metabolic Panel: Recent Labs    07/05/21 0604 07/06/21 0552 07/07/21 0431  NA 136 137 137  K 3.3* 4.1 4.5  CL 102 104 105  CO2 '28 25 24  '$ GLUCOSE 107* 87 103*  BUN 7 <5* <5*  CREATININE 0.68 0.59 0.63  CALCIUM 7.0* 6.8* 7.0*  MG 1.9  --   --   PHOS <1.0* 2.0*  --    Liver Function Tests: Recent Labs    07/06/21 0552 07/07/21 0431  AST 21 16  ALT 31 23  ALKPHOS 43 48  BILITOT 0.9 0.6  PROT 4.8* 5.1*  ALBUMIN 2.2* 2.2*   No results for input(s): LIPASE, AMYLASE in the last 72 hours. CBC: Recent Labs    07/06/21 0552 07/07/21 0431  WBC 11.3* 9.8  HGB 10.8* 10.4*  HCT 31.0* 30.9*  MCV 97.8 96.6  PLT 257 280   Cardiac Enzymes: No results for input(s): CKTOTAL, CKMB, CKMBINDEX, TROPONINI in the last 72 hours. BNP: Invalid input(s): POCBNP D-Dimer: No results for input(s): DDIMER in  the last 72 hours. Hemoglobin A1C: No results for input(s): HGBA1C in the last 72 hours. Fasting Lipid Panel: No results for input(s): CHOL, HDL, LDLCALC, TRIG, CHOLHDL, LDLDIRECT in the last 72 hours. Thyroid Function Tests: No results for input(s): TSH, T4TOTAL, T3FREE, THYROIDAB in the last 72 hours.  Invalid input(s): FREET3 Anemia Panel: No results for input(s): VITAMINB12, FOLATE, FERRITIN, TIBC, IRON, RETICCTPCT in the last 72 hours.  CARDIAC CATHETERIZATION  Result Date: 07/05/2021   Prox RCA to Mid RCA lesion is 25% stenosed.   Ost Cx to Prox Cx lesion is 25% stenosed.   LV end diastolic pressure is moderately elevated.   There is no aortic valve stenosis. Conclusion Mild non-obstructive coronary artery disease. Moderately elevated LVEDP of 22 mmHg, with systemic hypotension. Recommendations: Medical management of Stress cardiomyopathy (Takotsubo). Initiation of beta blocker and ACE/ARB as BP allows. Aspirin 81 mg indefinitely Aggressive secondary prevention     Echo LVEF 25 to 30%  TELEMETRY: Sinus rhythm 84 bpm:  ASSESSMENT AND PLAN:  Principal Problem:   Adjustment disorder with mixed anxiety and depressed mood Active Problems:   Mild recurrent major depression (HCC)   Acid reflux   Osteoporosis  History of bowel resection   Rectal prolapse   Diarrhea   Enteritis   Anorexia   Alcohol use   Anxiety   Insomnia   Coffee ground emesis   Atrial fibrillation with RVR (HCC)   Complicated grief   Grief at loss of child   Hypotension, chronic   Pressure injury of skin   Alcohol abuse   Generalized anxiety disorder    Takotsubo's cardiomyopathy, with elevated troponin [id number] ), ongoing chest pain with nonspecific ST-T wave abnormalities on ECG.  Of note, patient reports her daughter passed away 03/30/21.  2D echocardiogram revealed LVEF 25 to 30% with anteroapical regional wall motion normalities.  Cardiac catheterization 07/05/2021 revealed insignificant  coronary artery disease consistent with Takotsubo's. Paroxysmal atrial fibrillation upon presentation, converted to sinus rhythm, remains in sinus rhythm Acute metabolic encephalopathy felt to be due to possible drug and alcohol withdrawal, improved, conversing appropriately today Borderline hypotension, improved   Recommendations   1.  Continue low-dose enteric aspirin 81 mg daily 2.  Continue low-dose metoprolol succinate 12.5 mg daily 3.  Continue low-dose lisinopril 2.5 mg daily 4.  No further cardiac diagnostics at this time 5.  Follow-up with me 1 to 2 weeks after discharge     Isaias Cowman, MD, PhD, [facility] 07/07/2021 10:17 AM

## 2021-07-07 NOTE — Progress Notes (Signed)
Subjective: Patient transferred from ICU to regular floor yesterday.  Reports 1 loose bowel movement today but is there is not documented under flowsheets.  I talked to the patient's nurse and she states that every time patient states that she has had a loose bowel movement, she was advised to not flush it so the nurse can see it.  The nurse states that the patient does not leave it or when she has seen a bowel movement, it has been minimal and not a typical diarrhea looking bowel movement in quantity, and therefore it has not been documented in flowsheets as such.  Therefore, patient has not received Imodium for this.   Plan: MRE was ordered and has not been done yet.   I have advised the patient's nurse to call radiology in order to get contrast for MRE to complete the test.  Await results.  Continue Imodium as needed  Abdomen remains soft, nontender  If symptoms continue, flex sig without sedation can be considered for evaluation of diarrhea and biopsies for microscopic colitis.  However, this would not allow for evaluation of the right side of the colon which can have presence of microscopic colitis without it being present in the distal colon.  Dr. Allen Norris to follow the patient from tomorrow   LOS: 5 days   Carla Antigua, MD 07/07/2021, 2:30 PM

## 2021-07-07 NOTE — Progress Notes (Signed)
PT Cancellation Note  Patient Details Name: Carla Martinez MRN: [id number] DOB: 1961/04/14   Cancelled Treatment:    Reason Eval/Treat Not Completed: Patient declined, no reason specified. PT provides education regarding purpose of PT and benefits. Pt continues to decline, reporting she does not feel ready to participate even after PT education. PT will follow-up as able.   Ricard Dillon PT, DPT 07/07/2021, 10:16 AM

## 2021-07-07 NOTE — Evaluation (Signed)
Occupational Therapy Evaluation Patient Details Name: Carla Martinez MRN: [id number] DOB: 05/08/1961 Today's Date: 07/07/2021   History of Present Illness 60 y.o. female with medical history significant for depression, anxiety, insomnia, history of bowel resection, anorexia, mild recurrent major depression, GERD, baseline hypotension, who presented to the emergency department for chief concerns of nausea, vomiting, diarrhea.  Patient has been mourning death of daughter in 03-20-2021, and reportedly drinking alcohol and doing other recreational drugs including taking NyQuil.  Pt was supposed to undergo EGD and colonoscopy and was going through her bowel prep when she developed nausea & vomiting, which prompted admission to hospital.  This was followed by alteration in mental status changes, elevated troponin [id number] ), ongoing chest pain with nonspecific ST-T wave abnormalities on ECG. Cardiac catheterization 07/05/2021 revealed insignificant coronary artery disease consistent with Takotsubo's cardiomyopathy.   Clinical Impression   Pt seen for OT evaluation this date. Upon arrival to room, pt awake and supine in bed on RA. Pt alert and oriented to self, place, and some aspects of situation, and agreeable to OT evaluation. Prior to admission, pt was independent in all ADLs, IADLs, and functional mobility, living in an apartment (full flight of stairs to enter, no elevator) with mother and son. Pt reports that mother is available to assist 24/7, but that son has a disability and unable to provide physical assist. Pt currently requires MIN GUARD for standing grooming tasks and SUPERVISION for functional mobility of short household distances (~27f) without AD due to decreased strength and decreased activity tolerance. Of note, SpO2 87% following OOB mobility (able to increase to 93% following 1-min of activity cessation). Pt educated on PLB and importance of implementing during functional mobility.  Pt also reporting dizziness with OOB mobility; plan to obtain orthostatics in subsequent sessions. RN informed of SpO2 and pt reported dizziness. Pt would benefit from additional skilled OT services to maximize return to PLOF and maximize recall and carryover of energy conservation strategies in daily routines. Upon discharge, recommend [facility].        Recommendations for follow up therapy are one component of a multi-disciplinary discharge planning process, led by the attending physician.  Recommendations may be updated based on patient status, additional functional criteria and insurance authorization.   Follow Up Recommendations  Home health OT;Supervision - Intermittent    Equipment Recommendations  3 in 1 bedside commode;Tub/shower seat       Precautions / Restrictions Precautions Precautions: Fall Restrictions Weight Bearing Restrictions: No      Mobility Bed Mobility Overal bed mobility: Modified Independent             General bed mobility comments: Able to perform with ease with HOB elevated    Transfers Overall transfer level: Needs assistance   Transfers: Sit to/from Stand Sit to Stand: Supervision         General transfer comment: Supervision d/t pt reporting dizziness with OOB mobility    Balance Overall balance assessment: Needs assistance Sitting-balance support: No upper extremity supported;Feet supported Sitting balance-Leahy Scale: Normal Sitting balance - Comments: Normal sitting balance reaching outside BOS to don shoes   Standing balance support: No upper extremity supported;During functional activity Standing balance-Leahy Scale: Fair Standing balance comment: Supervision d/t pt reporting dizziness with functional mobility                           ADL either performed or assessed with clinical judgement   ADL Overall ADL's :  Needs assistance/impaired     Grooming: Brushing hair;Min guard;Standing                Lower Body Dressing: Modified independent;Sitting/lateral leans Lower Body Dressing Details (indicate cue type and reason): to don/doff shoes while seated EOB.             Functional mobility during ADLs: Supervision/safety (to walk ~20 ft without AD. Supervision d/t pt reporting dizziness)       Vision Baseline Vision/History: 1 Wears glasses              Pertinent Vitals/Pain Pain Assessment: No/denies pain     Hand Dominance Right   Extremity/Trunk Assessment Upper Extremity Assessment Upper Extremity Assessment: Generalized weakness   Lower Extremity Assessment Lower Extremity Assessment: Generalized weakness       Communication Communication Communication: No difficulties   Cognition Arousal/Alertness: Awake/alert Behavior During Therapy: WFL for tasks assessed/performed Overall Cognitive Status: Within Functional Limits for tasks assessed                                 General Comments: Alert and oriented to self, place, and some aspects of situation. Required mild encouragement to participate in OOB mobility   General Comments  After standing grooming task and functional mobility of short household distance (~72f), SpO2 87% with pt on RA. SpO2 able to increase to 93% following 1 min of activity cessation            Home Living Family/patient expects to be discharged to:: Private residence Living Arrangements: Parent;Children Available Help at Discharge: Family;Available 24 hours/day (Son has a disability. Mother available 24/7) Type of Home: Apartment Home Access: Stairs to enter Entrance Stairs-Number of Steps: full flight Entrance Stairs-Rails: Right;Left Home Layout: One level     Bathroom Shower/Tub: Tub/shower unit         Home Equipment: None   Additional Comments: Son's wheelchair      Prior Functioning/Environment Level of Independence: Independent                 OT Problem List: Decreased  strength;Decreased activity tolerance;Impaired balance (sitting and/or standing);Cardiopulmonary status limiting activity      OT Treatment/Interventions: Self-care/ADL training;Energy conservation;DME and/or AE instruction;Therapeutic exercise;Therapeutic activities;Patient/family education;Balance training    OT Goals(Current goals can be found in the care plan section) Acute Rehab OT Goals Patient Stated Goal: to take a bath OT Goal Formulation: With patient Time For Goal Achievement: 07/21/21 Potential to Achieve Goals: Good ADL Goals Pt Will Perform Lower Body Bathing: with min guard assist;sit to/from stand Pt Will Transfer to Toilet: with modified independence;ambulating Pt Will Perform Toileting - Clothing Manipulation and hygiene: with modified independence;sit to/from stand  OT Frequency: Min 1X/week    AM-PAC OT "6 Clicks" Daily Activity     Outcome Measure Help from another person eating meals?: None Help from another person taking care of personal grooming?: A Little Help from another person toileting, which includes using toliet, bedpan, or urinal?: A Little Help from another person bathing (including washing, rinsing, drying)?: A Little Help from another person to put on and taking off regular upper body clothing?: None Help from another person to put on and taking off regular lower body clothing?: A Little 6 Click Score: 20   End of Session Nurse Communication: Mobility status;Other (comment) (SpO2 following mobility)  Activity Tolerance: Patient tolerated treatment well Patient left: in bed;with call bell/phone within reach;with  bed alarm set;with family/visitor present  OT Visit Diagnosis: Unsteadiness on feet (R26.81);Muscle weakness (generalized) (M62.81)                Time: 1033-1050 OT Time Calculation (min): 17 min Charges:  OT General Charges $OT Visit: 1 Visit OT Evaluation $OT Eval Moderate Complexity: [place] Bluebell, OTR/L [place]

## 2021-07-08 ENCOUNTER — Encounter: Payer: Self-pay | Admitting: Cardiology

## 2021-07-08 DIAGNOSIS — I9589 Other hypotension: Secondary | ICD-10-CM | POA: Diagnosis not present

## 2021-07-08 DIAGNOSIS — F19239 Other psychoactive substance dependence with withdrawal, unspecified: Secondary | ICD-10-CM | POA: Diagnosis not present

## 2021-07-08 DIAGNOSIS — K529 Noninfective gastroenteritis and colitis, unspecified: Secondary | ICD-10-CM | POA: Diagnosis not present

## 2021-07-08 DIAGNOSIS — R197 Diarrhea, unspecified: Secondary | ICD-10-CM | POA: Diagnosis not present

## 2021-07-08 DIAGNOSIS — I5022 Chronic systolic (congestive) heart failure: Secondary | ICD-10-CM | POA: Diagnosis not present

## 2021-07-08 DIAGNOSIS — F4323 Adjustment disorder with mixed anxiety and depressed mood: Secondary | ICD-10-CM

## 2021-07-08 LAB — COMPREHENSIVE METABOLIC PANEL
ALT: 22 U/L (ref 0–44)
AST: 17 U/L (ref 15–41)
Albumin: 2.5 g/dL — ABNORMAL LOW (ref 3.5–5.0)
Alkaline Phosphatase: 49 U/L (ref 38–126)
Anion gap: 12 (ref 5–15)
BUN: 5 mg/dL — ABNORMAL LOW (ref 6–20)
CO2: 20 mmol/L — ABNORMAL LOW (ref 22–32)
Calcium: 7.9 mg/dL — ABNORMAL LOW (ref 8.9–10.3)
Chloride: 105 mmol/L (ref 98–111)
Creatinine, Ser: 0.82 mg/dL (ref 0.44–1.00)
GFR, Estimated: >60 mL/min (ref >60)
Glucose, Bld: 71 mg/dL (ref 70–99)
Potassium: 4.5 mmol/L (ref 3.5–5.1)
Sodium: 137 mmol/L (ref 135–145)
Total Bilirubin: 0.7 mg/dL (ref 0.3–1.2)
Total Protein: 5.7 g/dL — ABNORMAL LOW (ref 6.5–8.1)

## 2021-07-08 NOTE — Consult Note (Signed)
[facility] Consult   Reason for Consult:  follow-up from consult of 07/07/21 Referring Physician:  Emmaline Kluver, MD Patient Identification: Carla Martinez MRN:  [id number] Principal Diagnosis: Adjustment disorder with mixed anxiety and depressed mood Diagnosis:  Principal Problem:   Adjustment disorder with mixed anxiety and depressed mood Active Problems:   Mild recurrent major depression ([place])   Acid reflux   Osteoporosis   History of bowel resection   Rectal prolapse   Diarrhea   Enteritis   Anorexia   Alcohol use   Anxiety   Insomnia   Coffee ground emesis   Atrial fibrillation with RVR ([place])   Complicated grief   Grief at loss of child   Hypotension, chronic   Pressure injury of skin   Alcohol abuse   Generalized anxiety disorder   Total Time spent with patient: 45 minutes  Subjective:  "I am feeling better. It is going to take some time to get my strength back." Carla Martinez is a 60 y.o. female patient admitted to medical floor with gastric and cardiac problems.   HPI: Patient was seen and chart reviewed.  Patient was first approached this morning, when her mother and son were there.  Patient asked writer to return this afternoon, which I did.  Patient was approached when she was watching TV, calm in her room.  Patient was agreeable to interview.  Patient states that she did not realize not taking care of herself was affecting her health.  She was tearful when she explained that she is struggling with the death of her daughter, which happened in May 2002.  Patient moved here from [place] about 2 years ago.  Patient has a 31 year old son who is disabled, nonverbal.  Patient's son and her mother live with her.  Patient identifies many losses in her life and admits that she would benefit from psychotherapy.  She states that she has put her needs "on the back burner" due to her daughter's illness and caring for her son.  Patient denies any  suicidal intent by taking NyQuil and delta 8 Gummies.  She thought she was just helping ease her stress.  She realizes now this was only causing more problems. She is able to demonstrate planning for the future. Patient talks about choices she may make surrounding moving back to [place] or to [place]. She identifies several family members who are supportive, including her other 5 children and a sister.  She also identifies nieces who have been very supportive of her, although many do not live around here.  Patient anticipates discharge by Wednesday (9/21) and does not feel that she needs psychiatric hospitalization.  She denies ever having suicidal ideations.  Denies homicidal ideation, paranoia, auditory or visual hallucinations.  She is pleasant and appears to be forthright.  She is able to imagine a future, although she admits that at times she gets discouraged and sad sometimes. She states "I could never leave my other children." Discussed with patient that we will follow her while she is in the hospital.   Past Psychiatric History: See previous  Risk to Self:   Risk to Others:   Prior Inpatient Therapy:   Prior Outpatient Therapy:    Past Medical History:  Past Medical History:  Diagnosis Date   Anxiety    Depression     Past Surgical History:  Procedure Laterality Date   CHOLECYSTECTOMY     COLON RESECTION     12" removed   LEFT  HEART CATH AND CORONARY ANGIOGRAPHY N/A 07/05/2021   Procedure: LEFT HEART CATH AND CORONARY ANGIOGRAPHY;  Surgeon: Andrez Grime, MD;  Location: [facility];  Service: Cardiovascular;  Laterality: N/A;   MENISCUS REPAIR Bilateral    Family History:  Family History  Problem Relation Age of Onset   Leukemia Sister    Diabetes type I Child    Heart disease Sister    Hypertension Mother    Heart disease Mother    Stroke Mother    Down syndrome Son    Stroke Son    Family Psychiatric  History: See previous Social History:  Social  History   Substance and Sexual Activity  Alcohol Use Yes   Comment: 3-4 glasses a night     Social History   Substance and Sexual Activity  Drug Use Never    Social History   Socioeconomic History   Marital status: Single    Spouse name: Not on file   Number of children: 6   Years of education: some college   Highest education level: Not on file  Occupational History   Not on file  Tobacco Use   Smoking status: Every Day    Packs/day: 0.25    Years: 17.00    Pack years: 4.25    Types: Cigarettes    Start date: 11/25/2003   Smokeless tobacco: Never   Tobacco comments:    used to smoke 1PPD but has decreased. Down to 1-2 cigarettes daily  Vaping Use   Vaping Use: Never used  Substance and Sexual Activity   Alcohol use: Yes    Comment: 3-4 glasses a night   Drug use: Never   Sexual activity: Not Currently  Other Topics Concern   Not on file  Social History Narrative   02/14/21   From: moved to [facility] 2020 to be near family   Living: with mom and son who is dependent adult son with Down syndrome   Work: care giver      Family: 6 children - [name] lives with her, Surveyor, minerals (2002), - 4 living grandchildren      Enjoys: Training and development officer, Medical laboratory scientific officer      Exercise: use to exercise non-stop   Diet: good, today ate 2 hard boiled eggs, 1 meal and few snacks      Safety   Seat belts: Yes    Guns: No   Safe in relationships: Yes    Social Determinants of Radio broadcast assistant Strain: Not on file  Food Insecurity: Not on file  Transportation Needs: Not on file  Physical Activity: Not on file  Stress: Not on file  Social Connections: Not on file   Additional Social History:    Allergies:  No Known Allergies  Labs:  Results for orders placed or performed during the hospital encounter of 07/02/21 (from the past 48 hour(s))  CBC     Status: Abnormal   Collection Time: 07/07/21  4:31 AM  Result Value Ref Range   WBC 9.8 4.0 - 10.5 K/uL   RBC 3.20 (L) 3.87 - 5.11 MIL/uL    Hemoglobin 10.4 (L) 12.0 - 15.0 g/dL   HCT 30.9 (L) 36.0 - 46.0 %   MCV 96.6 80.0 - 100.0 fL   MCH 32.5 26.0 - 34.0 pg   MCHC 33.7 30.0 - 36.0 g/dL   RDW 13.4 11.5 - 15.5 %   Platelets 280 150 - 400 K/uL   nRBC 0.0 0.0 - 0.2 %  Comment: Performed at [facility], [address] [place], [place] [address]  Comprehensive metabolic panel     Status: Abnormal   Collection Time: 07/07/21  4:31 AM  Result Value Ref Range   Sodium 137 135 - 145 mmol/L   Potassium 4.5 3.5 - 5.1 mmol/L   Chloride 105 98 - 111 mmol/L   CO2 24 22 - 32 mmol/L   Glucose, Bld 103 (H) 70 - 99 mg/dL    Comment: Glucose reference range applies only to samples taken after fasting for at least 8 hours.   BUN <5 (L) 6 - 20 mg/dL   Creatinine, Ser 0.63 0.44 - 1.00 mg/dL   Calcium 7.0 (L) 8.9 - 10.3 mg/dL   Total Protein 5.1 (L) 6.5 - 8.1 g/dL   Albumin 2.2 (L) 3.5 - 5.0 g/dL   AST 16 15 - 41 U/L   ALT 23 0 - 44 U/L   Alkaline Phosphatase 48 38 - 126 U/L   Total Bilirubin 0.6 0.3 - 1.2 mg/dL   GFR, Estimated >60 >60 mL/min    Comment: (NOTE) Calculated using the CKD-EPI Creatinine Equation (2021)    Anion gap 8 5 - 15    Comment: Performed at [facility], [address] [place], [place] [address]  Comprehensive metabolic panel     Status: Abnormal   Collection Time: 07/08/21  7:46 AM  Result Value Ref Range   Sodium 137 135 - 145 mmol/L   Potassium 4.5 3.5 - 5.1 mmol/L   Chloride 105 98 - 111 mmol/L   CO2 20 (L) 22 - 32 mmol/L   Glucose, Bld 71 70 - 99 mg/dL    Comment: Glucose reference range applies only to samples taken after fasting for at least 8 hours.   BUN 5 (L) 6 - 20 mg/dL   Creatinine, Ser 0.82 0.44 - 1.00 mg/dL   Calcium 7.9 (L) 8.9 - 10.3 mg/dL   Total Protein 5.7 (L) 6.5 - 8.1 g/dL   Albumin 2.5 (L) 3.5 - 5.0 g/dL   AST 17 15 - 41 U/L   ALT 22 0 - 44 U/L   Alkaline Phosphatase 49 38 - 126 U/L   Total Bilirubin 0.7 0.3 - 1.2 mg/dL   GFR, Estimated >60 >60 mL/min     Comment: (NOTE) Calculated using the CKD-EPI Creatinine Equation (2021)    Anion gap 12 5 - 15    Comment: Performed at [facility], [address] [place],  [address]    Current Facility-Administered Medications  Medication Dose Route Frequency Provider Last Rate Last Admin   0.9 %  sodium chloride infusion  250 mL Intravenous PRN Andrez Grime, MD       amitriptyline (ELAVIL) tablet 25 mg  25 mg Oral QHS Andrez Grime, MD   25 mg at 07/07/21 2256   amoxicillin-clavulanate (AUGMENTIN) 875-125 MG per tablet 1 tablet  1 tablet Oral Q12H Bonnielee Haff, MD   1 tablet at 07/08/21 [id number]   aspirin EC tablet 81 mg  81 mg Oral Daily Paraschos, Alexander, MD   81 mg at 07/08/21 0833   busPIRone (BUSPAR) tablet 10 mg  10 mg Oral TID Bonnielee Haff, MD   10 mg at 07/08/21 1652   Chlorhexidine Gluconate Cloth 2 % PADS 6 each  6 each Topical Q0600 Andrez Grime, MD   6 each at 07/06/21 0914   enoxaparin (LOVENOX) injection 40 mg  40 mg Subcutaneous Q24H Bonnielee Haff, MD  40 mg at [id number]   folic acid (FOLVITE) tablet 1 mg  1 mg Oral Daily Andrez Grime, MD   1 mg at 07/08/21 [id number]   haloperidol lactate (HALDOL) injection 2 mg  2 mg Intravenous Q6H PRN Andrez Grime, MD   2 mg at 07/04/21 0436   hydrOXYzine (ATARAX/VISTARIL) tablet 50 mg  50 mg Oral TID PRN Andrez Grime, MD   50 mg at 07/07/21 1617   lisinopril (ZESTRIL) tablet 2.5 mg  2.5 mg Oral Daily Paraschos, Alexander, MD   2.5 mg at 07/08/21 [id number]   loperamide (IMODIUM) capsule 2 mg  2 mg Oral QID PRN Andrez Grime, MD   2 mg at 07/05/21 1613   magnesium oxide (MAG-OX) tablet 400 mg  400 mg Oral Daily Andrez Grime, MD   400 mg at 07/08/21 [id number]   MEDLINE mouth rinse  15 mL Mouth Rinse BID Andrez Grime, MD   15 mL at 07/07/21 2258   metoprolol succinate (TOPROL-XL) 24 hr tablet 12.5 mg  12.5 mg Oral Daily Paraschos, Alexander, MD   12.5 mg at 07/08/21 0833    midodrine (PROAMATINE) tablet 10 mg  10 mg Oral TID WC Andrez Grime, MD   10 mg at 07/08/21 1652   morphine 2 MG/ML injection 1 mg  1 mg Intravenous Q3H PRN Andrez Grime, MD   1 mg at 07/05/21 2356   multivitamin with minerals tablet 1 tablet  1 tablet Oral Daily Andrez Grime, MD   1 tablet at 07/08/21 [id number]   nicotine (NICODERM CQ - dosed in mg/24 hours) patch 14 mg  14 mg Transdermal Daily Andrez Grime, MD   14 mg at 07/08/21 0835   ondansetron (ZOFRAN) tablet 4 mg  4 mg Oral Q6H PRN Andrez Grime, MD   4 mg at 07/06/21 0908   Or   ondansetron (ZOFRAN) injection 4 mg  4 mg Intravenous Q6H PRN Andrez Grime, MD   4 mg at 07/06/21 1048   oxyCODONE (Oxy IR/ROXICODONE) immediate release tablet 5 mg  5 mg Oral Q6H PRN Andrez Grime, MD   5 mg at 07/08/21 1436   pantoprazole (PROTONIX) EC tablet 40 mg  40 mg Oral BID Andrez Grime, MD   40 mg at 07/08/21 [id number]   polyethylene glycol-electrolytes (NuLYTELY) solution 4,000 mL  4,000 mL Oral Once Andrez Grime, MD       promethazine (PHENERGAN) 12.5 mg in sodium chloride 0.9 % 50 mL IVPB  12.5 mg Intravenous Q8H PRN Andrez Grime, MD   Stopped at 07/03/21 1531   QUEtiapine (SEROQUEL) tablet 100 mg  100 mg Oral TID Andrez Grime, MD   100 mg at 07/08/21 1652   silver sulfADIAZINE (SILVADENE) 1 % cream   Topical BID Bonnielee Haff, MD       sodium chloride flush (NS) 0.9 % injection 3 mL  3 mL Intravenous Q12H Andrez Grime, MD   3 mL at 07/08/21 0929   sodium chloride flush (NS) 0.9 % injection 3 mL  3 mL Intravenous Q12H Andrez Grime, MD   3 mL at 07/08/21 0929   sodium chloride flush (NS) 0.9 % injection 3 mL  3 mL Intravenous PRN Andrez Grime, MD       thiamine tablet 100 mg  100 mg Oral Daily Andrez Grime, MD   100 mg at 07/08/21 [id number]   Or   thiamine (B-1)  injection 100 mg  100 mg Intravenous Daily Andrez Grime, MD       traZODone (DESYREL) tablet 50  mg  50 mg Oral QHS Bonnielee Haff, MD   50 mg at 07/07/21 2256   vitamin B-12 (CYANOCOBALAMIN) tablet 1,000 mcg  1,000 mcg Oral Daily Andrez Grime, MD   1,000 mcg at 07/08/21 [id number]    Musculoskeletal: Strength & Muscle Tone: decreased Gait & Station: unsteady Patient leans: N/A    Psychiatric Specialty Exam:  Presentation  General Appearance:  No data recorded Eye Contact: No data recorded Speech: No data recorded Speech Volume: No data recorded Handedness: No data recorded  Mood and Affect  Mood: No data recorded Affect: No data recorded  Thought Process  Thought Processes: No data recorded Descriptions of Associations:No data recorded Orientation:No data recorded Thought Content:No data recorded History of Schizophrenia/Schizoaffective disorder:No data recorded Duration of Psychotic Symptoms:No data recorded Hallucinations:No data recorded Ideas of Reference:No data recorded Suicidal Thoughts:No data recorded Homicidal Thoughts:No data recorded  Sensorium  Memory: No data recorded Judgment: No data recorded Insight: No data recorded  Executive Functions  Concentration: No data recorded Attention Span: No data recorded Recall: No data recorded Fund of Knowledge: No data recorded Language: No data recorded  Psychomotor Activity  Psychomotor Activity: No data recorded  Assets  Assets: No data recorded  Sleep  Sleep: No data recorded  Physical Exam: Physical Exam Vitals and nursing note reviewed.  HENT:     Head: Normocephalic.     Nose: No congestion or rhinorrhea.  Eyes:     General:        Right eye: No discharge.        Left eye: No discharge.  Cardiovascular:     Rate and Rhythm: Normal rate.  Pulmonary:     Effort: Pulmonary effort is normal.  Musculoskeletal:        General: Normal range of motion.     Cervical back: Normal range of motion.  Neurological:     Mental Status: She is alert and oriented to person,  place, and time.  Psychiatric:        Attention and Perception: Attention normal.        Mood and Affect: Mood is depressed.        Speech: Speech normal.        Behavior: Behavior normal.        Thought Content: Thought content normal. Thought content does not include suicidal ideation.        Cognition and Memory: Cognition normal.        Judgment: Judgment normal.   Review of Systems  Psychiatric/Behavioral:  Positive for depression (Stable).   All other systems reviewed and are negative. Blood pressure 107/70, pulse 75, temperature 98.3 F (36.8 C), temperature source Oral, resp. rate 18, height '5\' 3"'$  (1.6 m), weight 40.3 kg, SpO2 95 %. Body mass index is 15.74 kg/m.  Treatment Plan Summary: 60 year old woman with a psychiatry consult due to increasing anxiety, complicated grief, help loss of the child.  She appears to be gradually improving in the area of her anxieties.  She is calm and cooperative on evaluation today.  She states that she is physically weak, but getting better.  Patient's current psychiatric medications include amitriptyline 25 mg at bedtime; BuSpar 10 mg 3 times a day; Haldol injection 2 mg every 6 hours as needed for agitation; hydroxyzine tablet 50 mg 3 times a day as needed for anxiety; quetiapine 100 mg  3 times daily; trazodone 50 mg daily at bedtime.  Disposition: No evidence of imminent risk to self or others at present.   Patient does not meet criteria for psychiatric inpatient admission. Supportive therapy provided about ongoing stressors. Discussed crisis plan, support from social network, calling 911, coming to the Emergency Department, and calling Suicide Hotline.  Sherlon Handing, NP 07/08/2021 6:01 PM

## 2021-07-08 NOTE — Progress Notes (Signed)
Lucilla Lame, MD [facility]   [address] [place], [place] [address] Phone: [phone] Fax : [phone]   Subjective: The patient reports that she has not had any nausea vomiting except one episode since being admitted.  The patient's diarrhea has continued.  The patient was supposed to have a MRI yesterday but could not tolerate the claustrophobic feeling.  The patient states that she was supposed to follow up with GI but she was waiting for an appointment prior to admission. Despite her saying she has never seen GI before and was waiting for an appointment it appears that she was seen by Dr. Marius Ditch for the diarrhea in the middle of last month.  The patient was started on amitriptyline 25 mg at bedtime for her diarrhea.  She was evaluated by cardiology and due to her cardiomyopathy was deemed that she was a moderate to high risk for any endoscopic procedures.   Objective: Vital signs in last 24 hours: Vitals:   07/08/21 0737 07/08/21 1130 07/08/21 1437 07/08/21 1526  BP: 90/64 98/66 120/70 107/70  Pulse: 88 92  75  Resp: '18 19 20 18  '$ Temp: 98.7 F (37.1 C) 98.6 F (37 C)  98.3 F (36.8 C)  TempSrc: Oral Oral  Oral  SpO2: 98% 98% 98% 95%  Weight:      Height:       Weight change: -6.965 kg  Intake/Output Summary (Last 24 hours) at 07/08/2021 1933 Last data filed at 07/08/2021 1803 Gross per 24 hour  Intake 960 ml  Output --  Net 960 ml     Exam: Heart:: Regular rate and rhythm, S1S2 present, or without murmur or extra heart sounds Lungs: normal and clear to auscultation and percussion Abdomen: soft, nontender, normal bowel sounds   Lab Results: '@LABTEST2'$ @ Micro Results: Recent Results (from the past 240 hour(s))  SARS CORONAVIRUS 2 (TAT 6-24 HRS) Nasopharyngeal Nasopharyngeal Swab     Status: None   Collection Time: 07/02/21  1:50 PM   Specimen: Nasopharyngeal Swab  Result Value Ref Range Status   SARS Coronavirus 2 NEGATIVE NEGATIVE Final    Comment:  (NOTE) SARS-CoV-2 target nucleic acids are NOT DETECTED.  The SARS-CoV-2 RNA is generally detectable in upper and lower respiratory specimens during the acute phase of infection. Negative results do not preclude SARS-CoV-2 infection, do not rule out co-infections with other pathogens, and should not be used as the sole basis for treatment or other patient management decisions. Negative results must be combined with clinical observations, patient history, and epidemiological information. The expected result is Negative.  Fact Sheet for Patients: SugarRoll.be  Fact Sheet for Healthcare Providers: https://www.woods-mathews.com/  This test is not yet approved or cleared by the [place] FDA and  has been authorized for detection and/or diagnosis of SARS-CoV-2 by FDA under an Emergency Use Authorization (EUA). This EUA will remain  in effect (meaning this test can be used) for the duration of the COVID-19 declaration under Se ction 564(b)(1) of the Act, 21 U.S.C. section 360bbb-3(b)(1), unless the authorization is terminated or revoked sooner.  Performed at [facility], [address], [place], [place] [address]   MRSA Next Gen by PCR, Nasal     Status: None   Collection Time: 07/02/21  8:26 PM   Specimen: Nasal Mucosa; Nasal Swab  Result Value Ref Range Status   MRSA by PCR Next Gen NOT DETECTED NOT DETECTED Final    Comment: (NOTE) The GeneXpert MRSA Assay (FDA approved for NASAL  specimens only), is one component of a comprehensive MRSA colonization surveillance program. It is not intended to diagnose MRSA infection nor to guide or monitor treatment for MRSA infections. Test performance is not FDA approved in patients less than 28 years old. Performed at [facility], [address] [place], [place] [address]   Gastrointestinal Panel by PCR , Stool     Status: None   Collection Time: 07/04/21  5:52 AM   Specimen:  Stool  Result Value Ref Range Status   Campylobacter species NOT DETECTED NOT DETECTED Final   Plesimonas shigelloides NOT DETECTED NOT DETECTED Final   Salmonella species NOT DETECTED NOT DETECTED Final   Yersinia enterocolitica NOT DETECTED NOT DETECTED Final   Vibrio species NOT DETECTED NOT DETECTED Final   Vibrio cholerae NOT DETECTED NOT DETECTED Final   Enteroaggregative E coli (EAEC) NOT DETECTED NOT DETECTED Final   Enteropathogenic E coli (EPEC) NOT DETECTED NOT DETECTED Final   Enterotoxigenic E coli (ETEC) NOT DETECTED NOT DETECTED Final   Shiga like toxin producing E coli (STEC) NOT DETECTED NOT DETECTED Final   Shigella/Enteroinvasive E coli (EIEC) NOT DETECTED NOT DETECTED Final   Cryptosporidium NOT DETECTED NOT DETECTED Final   Cyclospora cayetanensis NOT DETECTED NOT DETECTED Final   Entamoeba histolytica NOT DETECTED NOT DETECTED Final   Giardia lamblia NOT DETECTED NOT DETECTED Final   Adenovirus F40/41 NOT DETECTED NOT DETECTED Final   Astrovirus NOT DETECTED NOT DETECTED Final   Norovirus GI/GII NOT DETECTED NOT DETECTED Final   Rotavirus A NOT DETECTED NOT DETECTED Final   Sapovirus (I, II, IV, and V) NOT DETECTED NOT DETECTED Final    Comment: Performed at [facility], [address] [place], [place] [address]  C Difficile Quick Screen w PCR reflex     Status: None   Collection Time: 07/04/21  5:52 AM   Specimen: STOOL  Result Value Ref Range Status   C Diff antigen NEGATIVE NEGATIVE Final   C Diff toxin NEGATIVE NEGATIVE Final   C Diff interpretation No C. difficile detected.  Final    Comment: Performed at [facility], [address] [place], [place] [address]   Studies/Results: No results found. Medications: I have reviewed the patient's current medications. Scheduled Meds:  amitriptyline  25 mg Oral QHS   amoxicillin-clavulanate  1 tablet Oral Q12H   aspirin EC  81 mg Oral Daily   busPIRone  10 mg Oral TID   Chlorhexidine  Gluconate Cloth  6 each Topical Q0600   enoxaparin (LOVENOX) injection  40 mg Subcutaneous [id number]   folic acid  1 mg Oral Daily   lisinopril  2.5 mg Oral Daily   magnesium oxide  400 mg Oral Daily   mouth rinse  15 mL Mouth Rinse BID   metoprolol succinate  12.5 mg Oral Daily   midodrine  10 mg Oral TID WC   multivitamin with minerals  1 tablet Oral Daily   nicotine  14 mg Transdermal Daily   pantoprazole  40 mg Oral BID   polyethylene glycol-electrolytes  4,000 mL Oral Once   QUEtiapine  100 mg Oral TID   silver sulfADIAZINE   Topical BID   sodium chloride flush  3 mL Intravenous Q12H   sodium chloride flush  3 mL Intravenous Q12H   thiamine  100 mg Oral Daily   Or   thiamine  100 mg Intravenous Daily   traZODone  50 mg Oral QHS   vitamin B-12  1,000  mcg Oral Daily   Continuous Infusions:  sodium chloride     promethazine (PHENERGAN) injection (IM or IVPB) Stopped (07/03/21 1531)   PRN Meds:.sodium chloride, haloperidol lactate, hydrOXYzine, loperamide, morphine injection, ondansetron **OR** ondansetron (ZOFRAN) IV, oxyCODONE, promethazine (PHENERGAN) injection (IM or IVPB), sodium chloride flush   Assessment: Principal Problem:   Adjustment disorder with mixed anxiety and depressed mood Active Problems:   Mild recurrent major depression (HCC)   Acid reflux   Osteoporosis   History of bowel resection   Rectal prolapse   Diarrhea   Enteritis   Anorexia   Alcohol use   Anxiety   Insomnia   Coffee ground emesis   Atrial fibrillation with RVR (HCC)   Complicated grief   Grief at loss of child   Hypotension, chronic   Pressure injury of skin   Alcohol abuse   Generalized anxiety disorder    Plan: This patient was seen by Dr. Marius Ditch last month for these symptoms and at this time has been deemed a moderate to high risk for any endoscopic procedures by cardiology.  I do not plan to do any inpatient GI procedures on the patient and she should follow up with Dr. Marius Ditch  after discharge.  Nothing further to do from a GI point of view.  I will sign off.  Please call if any further GI concerns or questions.  We would like to thank you for the opportunity to participate in the care of Kloi Baz.    LOS: 6 days   Lewayne Bunting 07/08/2021, 7:33 PM Pager [phone] 7am-5pm  Check AMION for 5pm -7am coverage and on weekends

## 2021-07-08 NOTE — Progress Notes (Signed)
[facility] Cardiology  SUBJECTIVE: Patient laying in bed, denies chest pain   Vitals:   07/08/21 0000 07/08/21 0400 07/08/21 0619 07/08/21 0737  BP: 103/66 98/67  90/64  Pulse: 90 85  88  Resp:  17  18  Temp: 98.9 F (37.2 C) 99.1 F (37.3 C)  98.7 F (37.1 C)  TempSrc: Oral   Oral  SpO2: 100% 100%  98%  Weight:   40.3 kg   Height:         Intake/Output Summary (Last 24 hours) at 07/08/2021 0820 Last data filed at 07/07/2021 1008 Gross per 24 hour  Intake 240 ml  Output --  Net 240 ml      PHYSICAL EXAM  General: Well developed, well nourished, in no acute distress HEENT:  Normocephalic and atramatic Neck:  No JVD.  Lungs: Clear bilaterally to auscultation and percussion. Heart: HRRR . Normal S1 and S2 without gallops or murmurs.  Abdomen: Bowel sounds are positive, abdomen soft and non-tender  Msk:  Back normal, normal gait. Normal strength and tone for age. Extremities: No clubbing, cyanosis or edema.   Neuro: Alert and oriented X 3. Psych:  Good affect, responds appropriately   LABS: Basic Metabolic Panel: Recent Labs    07/06/21 0552 07/07/21 0431 07/08/21 0746  NA 137 137 137  K 4.1 4.5 4.5  CL 104 105 105  CO2 25 24 20*  GLUCOSE 87 103* 71  BUN <5* <5* 5*  CREATININE 0.59 0.63 0.82  CALCIUM 6.8* 7.0* 7.9*  PHOS 2.0*  --   --    Liver Function Tests: Recent Labs    07/07/21 0431 07/08/21 0746  AST 16 17  ALT 23 22  ALKPHOS 48 49  BILITOT 0.6 0.7  PROT 5.1* 5.7*  ALBUMIN 2.2* 2.5*   No results for input(s): LIPASE, AMYLASE in the last 72 hours. CBC: Recent Labs    07/06/21 0552 07/07/21 0431  WBC 11.3* 9.8  HGB 10.8* 10.4*  HCT 31.0* 30.9*  MCV 97.8 96.6  PLT 257 280   Cardiac Enzymes: No results for input(s): CKTOTAL, CKMB, CKMBINDEX, TROPONINI in the last 72 hours. BNP: Invalid input(s): POCBNP D-Dimer: No results for input(s): DDIMER in the last 72 hours. Hemoglobin A1C: No results for input(s): HGBA1C in the last 72  hours. Fasting Lipid Panel: No results for input(s): CHOL, HDL, LDLCALC, TRIG, CHOLHDL, LDLDIRECT in the last 72 hours. Thyroid Function Tests: No results for input(s): TSH, T4TOTAL, T3FREE, THYROIDAB in the last 72 hours.  Invalid input(s): FREET3 Anemia Panel: No results for input(s): VITAMINB12, FOLATE, FERRITIN, TIBC, IRON, RETICCTPCT in the last 72 hours.  No results found.   Echo LVEF 25 to 30% with anteroapical hypokinesis consistent with Takotsubo  TELEMETRY: Sinus rhythm:  ASSESSMENT AND PLAN:  Principal Problem:   Adjustment disorder with mixed anxiety and depressed mood Active Problems:   Mild recurrent major depression (HCC)   Acid reflux   Osteoporosis   History of bowel resection   Rectal prolapse   Diarrhea   Enteritis   Anorexia   Alcohol use   Anxiety   Insomnia   Coffee ground emesis   Atrial fibrillation with RVR (HCC)   Complicated grief   Grief at loss of child   Hypotension, chronic   Pressure injury of skin   Alcohol abuse   Generalized anxiety disorder    Takotsubo's cardiomyopathy, with elevated troponin [id number] ), ongoing chest pain with nonspecific ST-T wave abnormalities on ECG.  Of note, patient reports  her daughter passed away 03/19/2021.  2D echocardiogram revealed LVEF 25 to 30% with anteroapical regional wall motion normalities.  Cardiac catheterization 07/05/2021 revealed insignificant coronary artery disease consistent with Takotsubo's.  Darted on metoprolol succinate and lisinopril at low-dose due to borderline hypotension. Paroxysmal atrial fibrillation, briefly upon presentation, converted to sinus rhythm, remains in sinus rhythm Acute metabolic encephalopathy felt to be due to possible drug and alcohol withdrawal, improved, conversing appropriately Borderline hypotension, improved   Recommendations   1.  Continue low-dose enteric aspirin 81 mg daily 2.  Continue low-dose metoprolol succinate 12.5 mg daily 3.  Continue  low-dose lisinopril 2.5 mg daily 4.  No further cardiac diagnostics at this time 5.  Follow-up with me 1 to 2 weeks after discharge  Sign off for now, please call if any questions  Isaias Cowman, MD, PhD, [facility] 07/08/2021 8:20 AM

## 2021-07-08 NOTE — Progress Notes (Signed)
attempted 2 times IN MRI after the pt was done drinking  the oral contrast and pt got claustro and very anxious and refused meds and the mri. pt requesting anesthesia. sent pt back per nurse Felicia@ 7:20TK ON 1/82

## 2021-07-08 NOTE — Progress Notes (Signed)
[facility] PROGRESS NOTE   Carla Martinez [id number] DOB: 06-02-1961 DOA: 07/02/2021  PCP: [name], MD  Brief History/Interval Summary: 60 y.o. female with medical history significant for depression, anxiety, insomnia, history of bowel resection, anorexia, mild recurrent major depression, GERD, baseline hypotension, who presented to the emergency department for chief concerns of nausea, vomiting, diarrhea.  Patient has been very depressed and tearful since May 2022 when her eldest daughter passed away.  Apparently has been drinking alcohol and doing other recreational drugs including taking NyQuil.  Was supposed to undergo EGD and colonoscopy and was going through her bowel prep when she developed nausea vomiting.  This prompted admission to the hospital.  This was followed by alteration in mental status changes and agitation requiring transfer to the ICU and was placed on Precedex.    Consultants: Cardiology.  Gastroenterology  Procedures: Underwent cardiac catheterization on 9/16.  Antibiotics: Anti-infectives (From admission, onward)    Start     Dose/Rate Route Frequency Ordered Stop   07/06/21 1300  amoxicillin-clavulanate (AUGMENTIN) 875-125 MG per tablet 1 tablet        1 tablet Oral Every 12 hours 07/06/21 1205     07/03/21 1400  Ampicillin-Sulbactam (UNASYN) 3 g in sodium chloride 0.9 % 100 mL IVPB  Status:  Discontinued        3 g 200 mL/hr over 30 Minutes Intravenous Every 6 hours 07/03/21 1045 07/06/21 1205   07/02/21 1400  cefTRIAXone (ROCEPHIN) 1 g in sodium chloride 0.9 % 100 mL IVPB  Status:  Discontinued        1 g 200 mL/hr over 30 Minutes Intravenous Every 24 hours 07/02/21 1342 07/02/21 1352   07/02/21 1400  metroNIDAZOLE (FLAGYL) IVPB 500 mg  Status:  Discontinued        500 mg 100 mL/hr over 60 Minutes Intravenous Every 12 hours 07/02/21 1342 07/03/21 0955   07/02/21 1400  cefTRIAXone (ROCEPHIN) 2 g in sodium chloride 0.9 % 100 mL IVPB         2 g 200 mL/hr over 30 Minutes Intravenous  Once 07/02/21 1351 07/02/21 1447       Subjective/Interval History: Patient seems to be doing much better from anxiety and mood standpoint.  However she reports getting very anxious when they took her down for MRI and she cannot do the study.  Some swelling is noted in the left upper extremity at the previous site of her IV access.  Continues to have some chest tightness and dyspnea whenever she exerts herself.    Assessment/Plan:  Acute metabolic encephalopathy/drug withdrawal/alcohol withdrawal Concern was for withdrawal from alcohol and other illicit recreational drugs that she had been doing recently.   Due to significant agitation she had to be placed on Precedex infusion.  No focal neurological deficits were noted.  She did develop hypotension as a result of this medication.  Precedex was subsequently weaned off. She was started back on her trazodone at a much lower dose along with her amitriptyline and Seroquel.  We are limited due to prolonged QT.  Will not increase the dose of these medications any further.  EKG done on 9/17 does not show significantly prolonged QT which is reassuring.   Urine drug screen positive for amphetamines, cannabinoid, phencyclidine as well as tricyclic.  Hypotension Based on previous records patient does have chronic hypotension.  This was made worse by Precedex infusion.  Started on midodrine.   Procalcitonin was 0.27.  Lactic acid level was only  2.0.   Blood pressure low but stable.  Asymptomatic.  Seems to have tolerated the addition of low-dose ACE inhibitor and beta-blocker   Chronic systolic CHF/NSTEMI Echocardiogram showed EF to be 25 to 30% which is new for this patient.  Cardiology is following.  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  Patient going through grief as she lost her daughter recently.   Due to persistent chest tightness and shortness of breath she underwent cardiac catheterization which did not show any  significant coronary artery disease.   Patient started on aspirin, low-dose ACE inhibitor and low-dose beta-blocker.  Seems to be tolerating these medications well so far.   No further cardiac testing is planned during this hospital stay.  Outpatient follow-up with cardiology in 1 to 2 weeks.     Atrial fibrillation with RVR Converted to sinus rhythm after she was given diltiazem.  Seen by cardiology.  She was placed on heparin infusion initially.  No anticoagulation recommended by cardiology.  Remains on aspirin.   TSH 0.75.  Free T4 1.15.  Echocardiogram shows low EF 25 to 30%.  See above.  Nausea vomiting and diarrhea/concern for GI bleed/gastroenteritis There was some concern for coffee-ground emesis.  Drop in hemoglobin is likely dilutional.  Patient has been taking increased amount of ibuprofen along with the use of alcohol recently.  Initially placed on Protonix infusion.  No overt bleeding has been noted.  Changed to twice a day Protonix.   Continues to have loose stools.  C. difficile and GI pathogen panel unremarkable.   Gastroenterology is following.  Initially EGD and colonoscopy was being considered since patient has had the symptoms longstanding and was being followed by gastroenterology in the outpatient setting.  However due to her new cardiac issues endoscopies could have moderate to high risk of cardiac complications so they are being deferred for now. MR enterography was ordered by gastroenterology but patient could not tolerate.  Defer further management to gastroenterology.  Normocytic anemia Mild drop in hemoglobin likely dilutional.  No overt bleeding noted.  Hemoglobin has been stable.  Aspiration pneumonia/acute respiratory failure with hypoxia Chest x-ray showed right-sided opacity.  She could have aspirated.  She was placed on Unasyn.  She was changed over to Augmentin and plan for a 7-day course which will end on 9/21.   Respiratory status is stable.  She has been  weaned off of oxygen.  Hypodense lesion in the liver with peripheral nodular enhancement CT scan suggest that this could be hemangioma.  Will need outpatient follow-up.  Hepatitis panel unremarkable  Hypokalemia/hypomagnesemia Repleted.  History of alcohol abuse On CIWA protocol.  Noted to be stable.  Improved.  History of depression and anxiety Severe anxiety also triggered recently by the loss of her daughter.  Patient seems to be quite anxious.   Patient seen by psychiatry.  They do not recommend increasing the dose of either her Seroquel or trazodone at this time.  They offered her BuSpar which she initially declined.   Rationale for not being able to use alternative agents due to her QT interval explained to the patient again and then she was willing to try BuSpar.  This was initiated and she seems to be tolerating well.    Stage I sacral decubitus Pressure Injury 07/02/21 Sacrum Medial Stage 1 -  Intact skin with non-blanchable redness of a localized area usually over a bony prominence. skin intact, pink, non-blanchable redness (Active)  07/02/21 2030  Location: Sacrum  Location Orientation: Medial  Staging: Stage 1 -  Intact skin with non-blanchable redness of a localized area usually over a bony prominence.  Wound Description (Comments): skin intact, pink, non-blanchable redness  Present on Admission: Yes     DVT Prophylaxis: Lovenox Code Status: DNR Family Communication: No family at bedside Disposition Plan: Hopefully return home when improved.  Start mobilizing.  Status is: Inpatient  Remains inpatient appropriate because:Altered mental status, Ongoing diagnostic testing needed not appropriate for outpatient work up, and IV treatments appropriate due to intensity of illness or inability to take PO  Dispo: The patient is from: Home              Anticipated d/c is to: Home              Patient currently is not medically stable to d/c.   Difficult to place patient  No       Medications: Scheduled:  amitriptyline  25 mg Oral QHS   amoxicillin-clavulanate  1 tablet Oral Q12H   aspirin EC  81 mg Oral Daily   busPIRone  10 mg Oral TID   Chlorhexidine Gluconate Cloth  6 each Topical Q0600   enoxaparin (LOVENOX) injection  40 mg Subcutaneous [id number]   folic acid  1 mg Oral Daily   lisinopril  2.5 mg Oral Daily   magnesium oxide  400 mg Oral Daily   mouth rinse  15 mL Mouth Rinse BID   metoprolol succinate  12.5 mg Oral Daily   midodrine  10 mg Oral TID WC   multivitamin with minerals  1 tablet Oral Daily   nicotine  14 mg Transdermal Daily   pantoprazole  40 mg Oral BID   polyethylene glycol-electrolytes  4,000 mL Oral Once   QUEtiapine  100 mg Oral TID   silver sulfADIAZINE   Topical BID   sodium chloride flush  3 mL Intravenous Q12H   sodium chloride flush  3 mL Intravenous Q12H   thiamine  100 mg Oral Daily   Or   thiamine  100 mg Intravenous Daily   traZODone  50 mg Oral QHS   vitamin B-12  1,000 mcg Oral Daily   Continuous:  sodium chloride     promethazine (PHENERGAN) injection (IM or IVPB) Stopped (07/03/21 1531)   [id number] chloride, haloperidol lactate, hydrOXYzine, loperamide, morphine injection, ondansetron **OR** ondansetron (ZOFRAN) IV, oxyCODONE, promethazine (PHENERGAN) injection (IM or IVPB), sodium chloride flush   Objective:  Vital Signs  Vitals:   07/08/21 0000 07/08/21 0400 07/08/21 0619 07/08/21 0737  BP: 103/66 98/67  90/64  Pulse: 90 85  88  Resp:  17  18  Temp: 98.9 F (37.2 C) 99.1 F (37.3 C)  98.7 F (37.1 C)  TempSrc: Oral   Oral  SpO2: 100% 100%  98%  Weight:   40.3 kg   Height:       No intake or output data in the 24 hours ending 07/08/21 1027  Filed Weights   07/05/21 1010 07/06/21 1610 07/08/21 0619  Weight: 39 kg 47.3 kg 40.3 kg    General appearance: Awake alert.  In no distress Resp: Clear to auscultation bilaterally.  Normal effort Cardio: S1-S2 is normal regular.  No S3-S4.  No  rubs murmurs or bruit GI: Abdomen is soft.  Nontender nondistended.  Bowel sounds are present normal.  No masses organomegaly Extremities: Slightly swollen left forearm.  Around the site of her IV.  No erythema.  No tenderness.  Good radial pulses. Neurologic: Alert and oriented x3.  No focal neurological deficits.  Lab Results:  Data Reviewed: I have personally reviewed following labs and imaging studies  CBC: Recent Labs  Lab 07/02/21 0644 07/03/21 0518 07/04/21 0524 07/05/21 0604 07/06/21 0552 07/07/21 0431  WBC 16.3* 18.3* 13.5* 9.9 11.3* 9.8  NEUTROABS 14.3*  --   --   --   --   --   HGB 13.0 10.3* 10.5* 9.9* 10.8* 10.4*  HCT 37.1 29.4* 29.8* 29.1* 31.0* 30.9*  MCV 94.2 96.7 98.3 96.0 97.8 96.6  PLT 428* 252 243 246 257 280     Basic Metabolic Panel: Recent Labs  Lab 07/02/21 0644 07/02/21 0906 07/03/21 0518 07/04/21 0524 07/05/21 0604 07/06/21 0552 07/07/21 0431 07/08/21 0746  NA 137  --    < > 136 136 137 137 137  K 3.0*  --    < > 3.6 3.3* 4.1 4.5 4.5  CL 84*  --    < > 100 102 104 105 105  CO2 30  --    < > '27 28 25 24 '$ 20*  GLUCOSE 215*  --    < > 84 107* 87 103* 71  BUN 28*  --    < > 16 7 <5* <5* 5*  CREATININE 1.15*  --    < > 0.69 0.68 0.59 0.63 0.82  CALCIUM 10.6*  --    < > 7.6* 7.0* 6.8* 7.0* 7.9*  MG  --  1.8  --  1.5* 1.9  --   --   --   PHOS 2.9  --   --   --  <1.0* 2.0*  --   --    < > = values in this interval not displayed.     GFR: Estimated Creatinine Clearance: 48.2 mL/min (by C-G formula based on SCr of 0.82 mg/dL).  Liver Function Tests: Recent Labs  Lab 07/04/21 0524 07/05/21 0604 07/06/21 0552 07/07/21 0431 07/08/21 0746  AST 37 '30 21 16 17  '$ ALT 37 36 '31 23 22  '$ ALKPHOS 46 40 43 48 49  BILITOT 0.7 0.5 0.9 0.6 0.7  PROT 5.0* 4.5* 4.8* 5.1* 5.7*  ALBUMIN 2.6* 2.1* 2.2* 2.2* 2.5*     Recent Labs  Lab 07/02/21 0644  LIPASE 30     Coagulation Profile: Recent Labs  Lab 07/02/21 1304  INR 1.1         Recent Results (from the past 240 hour(s))  SARS CORONAVIRUS 2 (TAT 6-24 HRS) Nasopharyngeal Nasopharyngeal Swab     Status: None   Collection Time: 07/02/21  1:50 PM   Specimen: Nasopharyngeal Swab  Result Value Ref Range Status   SARS Coronavirus 2 NEGATIVE NEGATIVE Final    Comment: (NOTE) SARS-CoV-2 target nucleic acids are NOT DETECTED.  The SARS-CoV-2 RNA is generally detectable in upper and lower respiratory specimens during the acute phase of infection. Negative results do not preclude SARS-CoV-2 infection, do not rule out co-infections with other pathogens, and should not be used as the sole basis for treatment or other patient management decisions. Negative results must be combined with clinical observations, patient history, and epidemiological information. The expected result is Negative.  Fact Sheet for Patients: SugarRoll.be  Fact Sheet for Healthcare Providers: https://www.woods-mathews.com/  This test is not yet approved or cleared by the [place] FDA and  has been authorized for detection and/or diagnosis of SARS-CoV-2 by FDA under an Emergency Use Authorization (EUA). This EUA will remain  in effect (meaning this test can be used) for the duration of the COVID-19 declaration under  Se ction 564(b)(1) of the Act, 21 U.S.C. section 360bbb-3(b)(1), unless the authorization is terminated or revoked sooner.  Performed at [facility], [place] [address], [place], [place] [address]   MRSA Next Gen by PCR, Nasal     Status: None   Collection Time: 07/02/21  8:26 PM   Specimen: Nasal Mucosa; Nasal Swab  Result Value Ref Range Status   MRSA by PCR Next Gen NOT DETECTED NOT DETECTED Final    Comment: (NOTE) The GeneXpert MRSA Assay (FDA approved for NASAL specimens only), is one component of a comprehensive MRSA colonization surveillance program. It is not intended to diagnose MRSA infection nor to  guide or monitor treatment for MRSA infections. Test performance is not FDA approved in patients less than 38 years old. Performed at [facility], [address] [place], [place] [address]   Gastrointestinal Panel by PCR , Stool     Status: None   Collection Time: 07/04/21  5:52 AM   Specimen: Stool  Result Value Ref Range Status   Campylobacter species NOT DETECTED NOT DETECTED Final   Plesimonas shigelloides NOT DETECTED NOT DETECTED Final   Salmonella species NOT DETECTED NOT DETECTED Final   Yersinia enterocolitica NOT DETECTED NOT DETECTED Final   Vibrio species NOT DETECTED NOT DETECTED Final   Vibrio cholerae NOT DETECTED NOT DETECTED Final   Enteroaggregative E coli (EAEC) NOT DETECTED NOT DETECTED Final   Enteropathogenic E coli (EPEC) NOT DETECTED NOT DETECTED Final   Enterotoxigenic E coli (ETEC) NOT DETECTED NOT DETECTED Final   Shiga like toxin producing E coli (STEC) NOT DETECTED NOT DETECTED Final   Shigella/Enteroinvasive E coli (EIEC) NOT DETECTED NOT DETECTED Final   Cryptosporidium NOT DETECTED NOT DETECTED Final   Cyclospora cayetanensis NOT DETECTED NOT DETECTED Final   Entamoeba histolytica NOT DETECTED NOT DETECTED Final   Giardia lamblia NOT DETECTED NOT DETECTED Final   Adenovirus F40/41 NOT DETECTED NOT DETECTED Final   Astrovirus NOT DETECTED NOT DETECTED Final   Norovirus GI/GII NOT DETECTED NOT DETECTED Final   Rotavirus A NOT DETECTED NOT DETECTED Final   Sapovirus (I, II, IV, and V) NOT DETECTED NOT DETECTED Final    Comment: Performed at [facility], [address] [place], [place] [address]  C Difficile Quick Screen w PCR reflex     Status: None   Collection Time: 07/04/21  5:52 AM   Specimen: STOOL  Result Value Ref Range Status   C Diff antigen NEGATIVE NEGATIVE Final   C Diff toxin NEGATIVE NEGATIVE Final   C Diff interpretation No C. difficile detected.  Final    Comment: Performed at [facility], [address] [place], [place] [address]       Radiology Studies: No results found.     LOS: 6 days   Franki Alcaide [employer] on [email or web address]  07/08/2021, 10:27 AM

## 2021-07-08 NOTE — Progress Notes (Signed)
Physical Therapy Treatment Patient Details Name: Carla Martinez MRN: [id number] DOB: August 20, 1961 Today's Date: 07/08/2021   History of Present Illness Per chart: 60 y.o. female with medical history significant for depression, anxiety, insomnia, history of bowel resection, anorexia, mild recurrent major depression, GERD, baseline hypotension, who presented to the emergency department for chief concerns of nausea, vomiting, diarrhea.  Patient has been mourning death of daughter in 03-16-21, and reportedly drinking alcohol and doing other recreational drugs including taking NyQuil.  Pt was supposed to undergo EGD and colonoscopy and was going through her bowel prep when she developed nausea & vomiting, which prompted admission to hospital.  This was followed by alteration in mental status changes, elevated troponin [id number] ), ongoing chest pain with nonspecific ST-T wave abnormalities on ECG. Cardiac catheterization 07/05/2021 revealed insignificant coronary artery disease consistent with Takotsubo's cardiomyopathy.    PT Comments    Pt seen for PT tx with sister present. Pt requesting to walk outside but no capable at this time. PT assessed orthostatic vitals with pt (+) with supine>sit. Pt stood ~3 minutes without BUE support with CGA increasing to min assist with pt noting fatigue, more shakiness, and 2 x LOB. Pt then unable to ambulate 2/2 fatigue from standing. Educated pt & sister on updated d/c recommendation of STR as pt is unable to negotiate a full flight of stairs at this time & lives with her elderly mother & dependent son. Pt would benefit from STR upon d/c to maximize independence with functional mobility, reduce fall risk, & decrease caregiver burden prior to return home.  BP checked in LUE: Supine: 100/66 mmHg (MAP 77) Sitting: 88/54 mmHg (MAP 66) Sitting after 2 minutes: 112/77 mmHg (MAP 87) Standing at 0: 94/68 mmHg (MAP 77) Pt denies symptoms. Educated pt on need for  nursing staff assistance for mobility in room.       Recommendations for follow up therapy are one component of a multi-disciplinary discharge planning process, led by the attending physician.  Recommendations may be updated based on patient status, additional functional criteria and insurance authorization.  Follow Up Recommendations  SNF;Supervision for mobility/OOB     Equipment Recommendations  None recommended by PT    Recommendations for Other Services       Precautions / Restrictions Precautions Precautions: Fall Restrictions Weight Bearing Restrictions: No     Mobility  Bed Mobility Overal bed mobility: Modified Independent             General bed mobility comments: supine<>sit with HOB elevated, bed rails    Transfers Overall transfer level: Needs assistance Equipment used: None Transfers: Sit to/from Stand Sit to Stand: Min guard            Ambulation/Gait                 Stairs             Wheelchair Mobility    Modified Rankin (Stroke Patients Only)       Balance Overall balance assessment: Needs assistance Sitting-balance support: No upper extremity supported;Feet supported Sitting balance-Leahy Scale: Normal     Standing balance support: No upper extremity supported;During functional activity Standing balance-Leahy Scale: Fair Standing balance comment: Pt able to maintain static standing without UE support ~3 minutes but with shaking BLE and LOB x 2 requiring up to min assist as activity/time progressed.  Cognition Arousal/Alertness: Awake/alert Behavior During Therapy: WFL for tasks assessed/performed Overall Cognitive Status: Difficult to assess                                 General Comments: Pt AxOx4 but with poor memory as she does not recall multiple falls at home but her sister notes pt has had many prior to admission. Pt repeats herself during session. Pt  unsure if memory issues are new/worse with current medical issues or if these are baseline.      Exercises      General Comments General comments (skin integrity, edema, etc.): Pt on room air at beginning of session, SpO2 >90% but dropped to 88% with standing. Pt placed on 2L/min & returned to >90%.      Pertinent Vitals/Pain Pain Assessment:  (c/o chest "tightness" & difficulty taking a deep breath with activity - provided pt with O2 & nurse notified)    Home Living Family/patient expects to be discharged to:: Private residence Living Arrangements: Parent;Children (40 y/o mother & "incapacitated, nonverbal" 66 y/o son) Available Help at Discharge: Family;Available 24 hours/day Type of Home: Apartment Home Access: Stairs to enter Entrance Stairs-Rails: Right;Left Home Layout: One level Home Equipment: Walker - 2 wheels      Prior Function Level of Independence: Independent      Comments: Pt reports she was independent, caring for her son up until 6 months ago when she began having issues. Pt poor recall but sister endorses multiple falls prior to admission.   PT Goals (current goals can now be found in the care plan section) Acute Rehab PT Goals Patient Stated Goal: go for a walk, go home Progress towards PT goals: Progressing toward goals    Frequency    Min 2X/week      PT Plan Discharge plan needs to be updated    Co-evaluation              AM-PAC PT "6 Clicks" Mobility   Outcome Measure  Help needed turning from your back to your side while in a flat bed without using bedrails?: None Help needed moving from lying on your back to sitting on the side of a flat bed without using bedrails?: None Help needed moving to and from a bed to a chair (including a wheelchair)?: A Little Help needed standing up from a chair using your arms (e.g., wheelchair or bedside chair)?: A Little Help needed to walk in hospital room?: A Little Help needed climbing 3-5 steps with  a railing? : A Lot 6 Click Score: 19    End of Session Equipment Utilized During Treatment: Oxygen Activity Tolerance: Patient tolerated treatment well;Patient limited by fatigue Patient left: in bed;with call bell/phone within reach;with bed alarm set;with family/visitor present Nurse Communication: Mobility status (c/o symptoms during session) PT Visit Diagnosis: Muscle weakness (generalized) (M62.81);Unsteadiness on feet (R26.81);History of falling (Z91.81)     Time: [id number] PT Time Calculation (min) (ACUTE ONLY): 25 min  Charges:  $Therapeutic Activity: 23-37 mins                     Carla Martinez, PT, DPT 07/08/21, 10:42 AM    Waunita Schooner 07/08/2021, 10:42 AM

## 2021-07-08 NOTE — Plan of Care (Signed)
  Problem: Activity: Goal: Risk for activity intolerance will decrease Outcome: Not Progressing Note: Patient is very dizzy while standing.

## 2021-07-08 NOTE — TOC Progression Note (Signed)
Transition of Care [facility]) - Progression Note    Patient Details  Name: Carla Martinez MRN: [id number] Date of Birth: 06-Mar-1961  [facility] CM/SW [place], RN Phone Number: 07/08/2021, 4:17 PM  Clinical Narrative:  Discussed SNF Rehab. With patient she declines, says she is getting much better and prefer to go home when discharged.     Expected Discharge Plan: Home/Self Care Barriers to Discharge: Continued Medical Work up  Expected Discharge Plan and Services Expected Discharge Plan: Home/Self Care   Discharge Planning Services: CM Consult   Living arrangements for the past 2 months: Apartment                 DME Arranged: N/A DME Agency: NA       HH Arranged: NA HH Agency: NA         Social Determinants of Health (SDOH) Interventions    Readmission Risk Interventions No flowsheet data found.

## 2021-07-09 DIAGNOSIS — I5022 Chronic systolic (congestive) heart failure: Secondary | ICD-10-CM | POA: Diagnosis not present

## 2021-07-09 MED ORDER — OXYCODONE HCL 5 MG PO TABS: 5.0000 mg | ORAL_TABLET | Freq: Four times a day (QID) | ORAL | 0 refills | Status: DC | PRN

## 2021-07-09 MED ORDER — LISINOPRIL 2.5 MG PO TABS: 2.5000 mg | ORAL_TABLET | Freq: Every day | ORAL | 1 refills | Status: DC

## 2021-07-09 MED ORDER — TRAZODONE HCL 50 MG PO TABS: 50.0000 mg | ORAL_TABLET | Freq: Every day | ORAL | 1 refills | Status: DC

## 2021-07-09 MED ORDER — MIDODRINE HCL 10 MG PO TABS: 10.0000 mg | ORAL_TABLET | Freq: Three times a day (TID) | ORAL | 2 refills | Status: AC

## 2021-07-09 MED ORDER — AMOXICILLIN-POT CLAVULANATE 875-125 MG PO TABS: 1.0000 | ORAL_TABLET | Freq: Two times a day (BID) | ORAL | 0 refills | Status: AC

## 2021-07-09 MED ORDER — BUSPIRONE HCL 10 MG PO TABS: 10.0000 mg | ORAL_TABLET | Freq: Three times a day (TID) | ORAL | 1 refills | Status: AC

## 2021-07-09 MED ORDER — ASPIRIN 81 MG PO TBEC: 81.0000 mg | DELAYED_RELEASE_TABLET | Freq: Every day | ORAL | 11 refills | Status: DC

## 2021-07-09 MED ORDER — LOPERAMIDE HCL 2 MG PO CAPS: 2.0000 mg | ORAL_CAPSULE | Freq: Four times a day (QID) | ORAL | 0 refills | Status: DC | PRN

## 2021-07-09 MED ORDER — PANTOPRAZOLE SODIUM 40 MG PO TBEC: 40.0000 mg | DELAYED_RELEASE_TABLET | Freq: Every day | ORAL | 0 refills | Status: DC

## 2021-07-09 MED ORDER — FOLIC ACID 1 MG PO TABS: 1.0000 mg | ORAL_TABLET | Freq: Every day | ORAL | 1 refills | Status: AC

## 2021-07-09 MED ORDER — METOPROLOL SUCCINATE ER 25 MG PO TB24: 12.5000 mg | ORAL_TABLET | Freq: Every day | ORAL | 1 refills | Status: DC

## 2021-07-09 MED ORDER — THIAMINE HCL 100 MG PO TABS: 100.0000 mg | ORAL_TABLET | Freq: Every day | ORAL | 0 refills | Status: AC

## 2021-07-09 NOTE — Consult Note (Signed)
  Writer went to follow up with patient and learned that she had been discharged earlier in the day.

## 2021-07-09 NOTE — Discharge Summary (Signed)
[facility]  Physician Discharge Summary   Patient ID: Devoiry Corriher MRN: [id number] DOB/AGE: 1961-05-03 60 y.o.  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  This will need further evaluation in the outpatient setting.  Home Health: PT and OT Equipment/Devices: None  CODE STATUS: DNR  DISCHARGE CONDITION: fair  Diet recommendation: Heart healthy  INITIAL HISTORY: 60 y.o. female with medical history significant for depression, anxiety, insomnia, history of bowel resection, anorexia, mild recurrent major depression, GERD, baseline hypotension, who presented to the emergency department for chief concerns of nausea, vomiting, diarrhea.  Patient has been very depressed and tearful since May 2022 when her eldest daughter passed away.  Apparently has been drinking alcohol and doing other recreational drugs including taking NyQuil.  Was supposed to undergo EGD and colonoscopy and was going through her bowel prep when she developed nausea vomiting.  This prompted admission to the hospital.  This was followed by alteration in mental status changes and agitation requiring transfer to the ICU and was placed on Precedex.      Consultants: Cardiology.  Gastroenterology   Procedures: Underwent cardiac catheterization on 9/16.   HOSPITAL COURSE:   Acute metabolic encephalopathy/drug withdrawal/alcohol withdrawal Concern was for  withdrawal from alcohol and other illicit recreational drugs that she had been doing recently.   Due to significant agitation she had to be placed on Precedex infusion.  No focal neurological deficits were noted.  She did develop hypotension as a result of this medication.  Precedex was subsequently weaned off. She was started back on her trazodone at a much lower dose along with her amitriptyline and Seroquel.  We were limited due to prolonged QT.   EKG done on 9/17 does not show significantly prolonged QT which is reassuring.   Urine drug screen positive for amphetamines, cannabinoid, phencyclidine as well as tricyclic.  Chronic Hypotension Based on previous records patient does have chronic hypotension.  This was made worse by Precedex infusion.  Started on midodrine.   Procalcitonin was 0.27.  Lactic acid level was only 2.0.   Blood pressure low but stable.  Asymptomatic.  Seems to have tolerated the addition of low-dose ACE inhibitor and beta-blocker    Chronic systolic CHF/NSTEMI Echocardiogram showed EF to be 25 to 30% which is new for this patient.  [facility] was following.  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  Patient going through grief as she lost her daughter recently.   Due to persistent chest tightness and shortness of breath she underwent cardiac catheterization which did not show any significant coronary artery disease.   Patient started on aspirin, low-dose ACE inhibitor and low-dose beta-blocker.  Seems to be tolerating these medications well so far.   No further cardiac testing is planned during this hospital stay.  Outpatient follow-up with cardiology in 1 to 2 weeks.     Atrial fibrillation with RVR Converted to sinus rhythm after she was given diltiazem.  Seen by cardiology.  She was placed on heparin infusion initially.  No anticoagulation recommended by cardiology.  Remains on aspirin.   TSH 0.75.  Free T4  1.15.  Echocardiogram shows low EF 25 to 30%.  See above.   Nausea vomiting and  diarrhea/concern for GI bleed/gastroenteritis There was some concern for coffee-ground emesis.  Drop in hemoglobin is likely dilutional.  Patient has been taking increased amount of ibuprofen along with the use of alcohol recently.  Initially placed on Protonix infusion.  No overt bleeding has been noted.  Changed to twice a day Protonix.   Continues to have loose stools.  C. difficile and GI pathogen panel unremarkable.   Gastroenterology was consulted.  Initially EGD and colonoscopy was being considered since patient has had the symptoms longstanding and was being followed by gastroenterology in the outpatient setting.  However due to her new cardiac issues endoscopies could have moderate to high risk of cardiac complications so they are being deferred for now. Needs outpatient follow-up with her primary gastroenterologist for further management.   Normocytic anemia Mild drop in hemoglobin likely dilutional.  No overt bleeding noted.  Hemoglobin has been stable.   Aspiration pneumonia/acute respiratory failure with hypoxia Chest x-ray showed right-sided opacity.  She could have aspirated.  She was placed on Unasyn.  She was changed over to Augmentin and plan for a 7-day course which will end on 9/21.   Respiratory status is stable.  She has been weaned off of oxygen.   Hypodense lesion in the liver with peripheral nodular enhancement CT scan suggest that this could be hemangioma.  Will need outpatient follow-up.  Hepatitis panel unremarkable   Hypokalemia/hypomagnesemia Repleted.  History of alcohol abuse On CIWA protocol.  Noted to be stable.  Improved.   History of depression and anxiety Severe anxiety also triggered recently by the loss of her daughter.  Patient seems to be quite anxious.   Patient seen by psychiatry.  They do not recommend increasing the dose of either her Seroquel or trazodone at this time.  They offered her BuSpar which she initially declined.   Rationale for not  being able to use alternative agents due to her QT interval explained to the patient again and then she was willing to try BuSpar.  This was initiated and she seems to be tolerating well.    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 Recommend nonemergent lumbar spine MRI with and without contrast for further evaluation.  Age-indeterminate compression deformity of the L2 vertebral body.  Patient did not have any tenderness over her back.  Stage I sacral decubitus Pressure Injury 07/02/21 Sacrum Medial Stage 1 -  Intact skin with non-blanchable redness of a localized area usually over a bony prominence. skin intact, pink, non-blanchable redness (Active)  07/02/21 2030  Location: Sacrum  Location Orientation: Medial  Staging: Stage 1 -  Intact skin with non-blanchable redness of a localized area usually over a bony prominence.  Wound Description (Comments): skin intact, pink, non-blanchable redness  Present on Admission: Yes      Patient is stable.  Refuses to go to skilled nursing facility for rehab.  Agreeable to home health.  Okay for discharge today.  PERTINENT LABS:  The results of significant diagnostics from this hospitalization (including imaging, microbiology, ancillary and laboratory) are listed below for reference.    Microbiology: Recent Results (from the past 240 hour(s))  SARS CORONAVIRUS 2 (TAT 6-24 HRS) Nasopharyngeal Nasopharyngeal Swab     Status: None   Collection Time: 07/02/21  1:50 PM  Specimen: Nasopharyngeal Swab  Result Value Ref Range Status   SARS Coronavirus 2 NEGATIVE NEGATIVE Final    Comment: (NOTE) SARS-CoV-2 target nucleic acids are NOT DETECTED.  The SARS-CoV-2 RNA is generally detectable in upper and lower respiratory specimens during the acute phase of infection. Negative results do not preclude SARS-CoV-2  infection, do not rule out co-infections with other pathogens, and should not be used as the sole basis for treatment or other patient management decisions. Negative results must be combined with clinical observations, patient history, and epidemiological information. The expected result is Negative.  Fact Sheet for Patients: SugarRoll.be  Fact Sheet for Healthcare Providers: https://www.woods-mathews.com/  This test is not yet approved or cleared by the [place] FDA and  has been authorized for detection and/or diagnosis of SARS-CoV-2 by FDA under an Emergency Use Authorization (EUA). This EUA will remain  in effect (meaning this test can be used) for the duration of the COVID-19 declaration under Se ction 564(b)(1) of the Act, 21 U.S.C. section 360bbb-3(b)(1), unless the authorization is terminated or revoked sooner.  Performed at [facility], [place] [address], [place], [place] [address]   MRSA Next Gen by PCR, Nasal     Status: None   Collection Time: 07/02/21  8:26 PM   Specimen: Nasal Mucosa; Nasal Swab  Result Value Ref Range Status   MRSA by PCR Next Gen NOT DETECTED NOT DETECTED Final    Comment: (NOTE) The GeneXpert MRSA Assay (FDA approved for NASAL specimens only), is one component of a comprehensive MRSA colonization surveillance program. It is not intended to diagnose MRSA infection nor to guide or monitor treatment for MRSA infections. Test performance is not FDA approved in patients less than 43 years old. Performed at [facility], [address] [place], [place] [address]   Gastrointestinal Panel by PCR , Stool     Status: None   Collection Time: 07/04/21  5:52 AM   Specimen: Stool  Result Value Ref Range Status   Campylobacter species NOT DETECTED NOT DETECTED Final   Plesimonas shigelloides NOT DETECTED NOT DETECTED Final   Salmonella species NOT DETECTED NOT DETECTED Final   Yersinia  enterocolitica NOT DETECTED NOT DETECTED Final   Vibrio species NOT DETECTED NOT DETECTED Final   Vibrio cholerae NOT DETECTED NOT DETECTED Final   Enteroaggregative E coli (EAEC) NOT DETECTED NOT DETECTED Final   Enteropathogenic E coli (EPEC) NOT DETECTED NOT DETECTED Final   Enterotoxigenic E coli (ETEC) NOT DETECTED NOT DETECTED Final   Shiga like toxin producing E coli (STEC) NOT DETECTED NOT DETECTED Final   Shigella/Enteroinvasive E coli (EIEC) NOT DETECTED NOT DETECTED Final   Cryptosporidium NOT DETECTED NOT DETECTED Final   Cyclospora cayetanensis NOT DETECTED NOT DETECTED Final   Entamoeba histolytica NOT DETECTED NOT DETECTED Final   Giardia lamblia NOT DETECTED NOT DETECTED Final   Adenovirus F40/41 NOT DETECTED NOT DETECTED Final   Astrovirus NOT DETECTED NOT DETECTED Final   Norovirus GI/GII NOT DETECTED NOT DETECTED Final   Rotavirus A NOT DETECTED NOT DETECTED Final   Sapovirus (I, II, IV, and V) NOT DETECTED NOT DETECTED Final    Comment: Performed at [facility], [address] [place], [place] [address]  C Difficile Quick Screen w PCR reflex     Status: None   Collection Time: 07/04/21  5:52 AM   Specimen: STOOL  Result Value Ref Range Status   C Diff antigen NEGATIVE NEGATIVE Final   C Diff toxin  NEGATIVE NEGATIVE Final   C Diff interpretation No C. difficile detected.  Final    Comment: Performed at [facility], [address] [place], [place] [address]     Labs:  COVID-19 Labs   Lab Results  Component Value Date   [place] NEGATIVE 07/02/2021      Basic Metabolic Panel: Recent Labs  Lab 07/04/21 0524 07/05/21 0604 07/06/21 0552 07/07/21 0431 07/08/21 0746  NA 136 136 137 137 137  K 3.6 3.3* 4.1 4.5 4.5  CL 100 102 104 105 105  CO2 27 28 25 24  20*  GLUCOSE 84 107* 87 103* 71  BUN 16 7 <5* <5* 5*  CREATININE 0.69 0.68 0.59 0.63 0.82  CALCIUM 7.6* 7.0* 6.8* 7.0* 7.9*  MG 1.5* 1.9  --   --   --   PHOS  --  <1.0*  2.0*  --   --    Liver Function Tests: Recent Labs  Lab 07/04/21 0524 07/05/21 0604 07/06/21 0552 07/07/21 0431 07/08/21 0746  AST 37 30 21 16 17   ALT 37 36 31 23 22   ALKPHOS 46 40 43 48 49  BILITOT 0.7 0.5 0.9 0.6 0.7  PROT 5.0* 4.5* 4.8* 5.1* 5.7*  ALBUMIN 2.6* 2.1* 2.2* 2.2* 2.5*    CBC: Recent Labs  Lab 07/03/21 0518 07/04/21 0524 07/05/21 0604 07/06/21 0552 07/07/21 0431  WBC 18.3* 13.5* 9.9 11.3* 9.8  HGB 10.3* 10.5* 9.9* 10.8* 10.4*  HCT 29.4* 29.8* 29.1* 31.0* 30.9*  MCV 96.7 98.3 96.0 97.8 96.6  PLT 252 243 246 257 280     CBG: Recent Labs  Lab 07/02/21 2037 07/03/21 1400  GLUCAP 135* 120*     IMAGING STUDIES CT ABDOMEN PELVIS W CONTRAST  Result Date: 07/02/2021 CLINICAL DATA:  Abdominal distention, nausea, vomiting common diarrhea EXAM: CT ABDOMEN AND PELVIS WITH CONTRAST TECHNIQUE: Multidetector CT imaging of the abdomen and pelvis was performed using the standard protocol following bolus administration of intravenous contrast. CONTRAST:  37mL OMNIPAQUE IOHEXOL 350 MG/ML SOLN COMPARISON:  None. FINDINGS: Lower chest: The lung bases are clear. The imaged heart is unremarkable. Hepatobiliary: The liver is enlarged there is a 1.7 cm by 1.2 cm by 1.6 cm hypodense lesion in hepatic segment VI with foci of peripheral nodular enhancement. There is moderate periportal edema, nonspecific. The gallbladder is surgically absent. Mild prominence of the extrahepatic bile ducts is likely due to the history of cholecystectomy. Pancreas: Unremarkable. Spleen: Unremarkable. Adrenals/Urinary Tract: The adrenals are unremarkable. The kidneys are normal, with no focal lesion, stone, hydronephrosis, or hydroureter. The bladder is unremarkable. Stomach/Bowel: The stomach is unremarkable. There are multiple loops of small bowel in the left upper quadrant with a moderately thickened wall measuring up to 8 mm. There is no evidence of mechanical bowel obstruction. Vascular/Lymphatic:  There is scattered calcified atherosclerotic plaque throughout the nonaneurysmal abdominal aorta. The major branch vessels are patent. There is no abdominal or pelvic lymphadenopathy. Reproductive: The uterus and adnexa are unremarkable. Other: There is no ascites or free air. Musculoskeletal: There is scalloping of the posterior endplate of the L5 vertebral body with widening of the right neural foramen raising suspicion for a nerve sheath tumor (6-50). Ovoid hypodense lesions are also seen in the sacral canal. There is age-indeterminate compression deformity of the L2 vertebral body with approximately 30% loss of vertebral body height. There is minimal bony retropulsion at this level. IMPRESSION: 1. Multiple loops of small bowel with moderately thickened wall in the left upper quadrant suggesting  nonspecific infectious or inflammatory enteritis. 2. Moderate periportal edema is nonspecific and can be seen in the setting of heart failure, hepatitis, among other etiologies. Correlate with liver function tests and history, and consider correlation with hepatitis testing as indicated. 3. Indeterminate hypodense lesion in the liver with peripheral nodular enhancement most likely reflects a hemangioma; however, recommend nonemergent outpatient liver protocol MRI of the abdomen with and without contrast for characterization. 4. 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 Recommend nonemergent lumbar spine MRI with and without contrast for further evaluation. 5. Age-indeterminate compression deformity of the L2 vertebral body. Correlate with point tenderness, and MRI may be considered for evaluation of chronicity. If this MRI is obtained during this ED/inpatient visit, postcontrast imaging is recommended for evaluation of the suspected nerve sheath tumor. Aortic Atherosclerosis (ICD10-I70.0). Electronically Signed    By: Valetta Mole M.D.   On: 07/02/2021 11:19   CARDIAC CATHETERIZATION  Result Date: 07/05/2021   Prox RCA to Mid RCA lesion is 25% stenosed.   Ost Cx to Prox Cx lesion is 25% stenosed.   LV end diastolic pressure is moderately elevated.   There is no aortic valve stenosis. Conclusion Mild non-obstructive coronary artery disease. Moderately elevated LVEDP of 22 mmHg, with systemic hypotension. Recommendations: Medical management of Stress cardiomyopathy (Takotsubo). Initiation of beta blocker and ACE/ARB as BP allows. Aspirin 81 mg indefinitely Aggressive secondary prevention   DG Chest Port 1 View  Result Date: 07/02/2021 CLINICAL DATA:  Nausea, vomiting, diarrhea EXAM: PORTABLE CHEST 1 VIEW COMPARISON:  None. FINDINGS: Single frontal view of the chest demonstrates an unremarkable cardiac silhouette. Since the CT scan performed earlier today, there is been development of central vascular congestion and patchy right infrahilar airspace disease suspicious for edema. No effusion or pneumothorax. No acute bony abnormalities. IMPRESSION: 1. Worsening volume status, with central vascular congestion and likely developing basilar edema. Electronically Signed   By: Randa Ngo M.D.   On: 07/02/2021 18:03   ECHOCARDIOGRAM COMPLETE  Result Date: 07/03/2021    ECHOCARDIOGRAM REPORT   Patient Name:   ANANIAH MACIOLEK Date of Exam: 07/03/2021 Medical Rec #:  [id number]            Height:       63.0 in Accession #:    [id number]           Weight:       86.0 lb Date of Birth:  12-20-1960            BSA:          1.351 m Patient Age:    49 years             BP:           94/63 mmHg Patient Gender: F                    HR:           78 bpm. Exam Location:  [facility] Procedure: 2D Echo, Cardiac Doppler and Color Doppler Indications:     Dyspnea R06.00                  Elevated troponin  History:         Patient has no prior history of Echocardiogram examinations.                  Arrythmias:Atrial Fibrillation. Anxiety,  depression.  Sonographer:     Sherrie Sport Referring Phys:  [id number] AMY N COX Diagnosing Phys: Serafina Royals MD  Sonographer Comments: Suboptimal apical window. IMPRESSIONS  1. Left ventricular ejection fraction, by estimation, is 25 to 30%. The left ventricle has severely decreased function. The left ventricle demonstrates regional wall motion abnormalities (see scoring diagram/findings for description). The left ventricular internal cavity size was mildly to moderately dilated. Left ventricular diastolic parameters were normal.  2. Right ventricular systolic function is normal. The right ventricular size is normal.  3. Left atrial size was mildly dilated.  4. The mitral valve is normal in structure. Mild mitral valve regurgitation.  5. The aortic valve is normal in structure. Aortic valve regurgitation is trivial. FINDINGS  Left Ventricle: Left ventricular ejection fraction, by estimation, is 25 to 30%. The left ventricle has severely decreased function. The left ventricle demonstrates regional wall motion abnormalities. The left ventricular internal cavity size was mildly  to moderately dilated. There is no left ventricular hypertrophy. Left ventricular diastolic parameters were normal.  LV Wall Scoring: The entire apex, mid anterolateral segment, and mid inferoseptal segment are akinetic. The mid anteroseptal segment, mid inferolateral segment, mid anterior segment, and mid inferior segment are hypokinetic. The basal anteroseptal segment, basal inferolateral segment, basal anterolateral segment, basal anterior segment, basal inferior segment, and basal inferoseptal segment are normal. Right Ventricle: The right ventricular size is normal. No increase in right ventricular wall thickness. Right ventricular systolic function is normal. Left Atrium: Left atrial size was mildly dilated. Right Atrium: Right atrial size was normal in size. Pericardium: There is no evidence of pericardial effusion. Mitral Valve: The  mitral valve is normal in structure. Mild mitral valve regurgitation. Tricuspid Valve: The tricuspid valve is normal in structure. Tricuspid valve regurgitation is trivial. Aortic Valve: The aortic valve is normal in structure. Aortic valve regurgitation is trivial. Aortic valve mean gradient measures 1.0 mmHg. Aortic valve peak gradient measures 2.6 mmHg. Aortic valve area, by VTI measures 3.41 cm. Pulmonic Valve: The pulmonic valve was normal in structure. Pulmonic valve regurgitation is not visualized. Aorta: The aortic root and ascending aorta are structurally normal, with no evidence of dilitation. IAS/Shunts: No atrial level shunt detected by color flow Doppler.  LEFT VENTRICLE PLAX 2D LVIDd:         4.10 cm     Diastology LVIDs:         4.00 cm     LV e' medial:    5.66 cm/s LV PW:         0.90 cm     LV E/e' medial:  11.5 LV IVS:        0.70 cm     LV e' lateral:   4.57 cm/s LVOT diam:     2.10 cm     LV E/e' lateral: 14.2 LV SV:         46 LV SV Index:   34 LVOT Area:     3.46 cm  LV Volumes (MOD) LV vol d, MOD A2C: 87.1 ml LV vol d, MOD A4C: 92.2 ml LV vol s, MOD A2C: 75.6 ml LV vol s, MOD A4C: 77.8 ml LV SV MOD A2C:     11.5 ml LV SV MOD A4C:     92.2 ml LV SV MOD BP:      9.1 ml RIGHT VENTRICLE RV Basal diam:  2.90 cm RV S prime:     7.72 cm/s TAPSE (M-mode): 3.0 cm LEFT ATRIUM  Index       RIGHT ATRIUM          Index LA diam:        3.50 cm 2.59 cm/m  RA Area:     8.31 cm LA Vol (A2C):   41.2 ml 30.50 ml/m RA Volume:   15.50 ml 11.47 ml/m LA Vol (A4C):   15.4 ml 11.40 ml/m LA Biplane Vol: 26.8 ml 19.84 ml/m  AORTIC VALVE                   PULMONIC VALVE AV Area (Vmax):    3.24 cm    PV Vmax:        0.41 m/s AV Area (Vmean):   3.14 cm    PV Peak grad:   0.7 mmHg AV Area (VTI):     3.41 cm    RVOT Peak grad: 1 mmHg AV Vmax:           80.30 cm/s AV Vmean:          55.200 cm/s AV VTI:            0.136 m AV Peak Grad:      2.6 mmHg AV Mean Grad:      1.0 mmHg LVOT Vmax:         75.10  cm/s LVOT Vmean:        50.000 cm/s LVOT VTI:          0.134 m LVOT/AV VTI ratio: 0.99  AORTA Ao Root diam: 3.30 cm MITRAL VALVE               TRICUSPID VALVE MV Area (PHT): 3.83 cm    TR Peak grad:   34.8 mmHg MV Decel Time: 198 msec    TR Vmax:        295.00 cm/s MV E velocity: 65.10 cm/s MV A velocity: 42.40 cm/s  SHUNTS MV E/A ratio:  1.54        Systemic VTI:  0.13 m                            Systemic Diam: 2.10 cm Serafina Royals MD Electronically signed by Serafina Royals MD Signature Date/Time: 07/03/2021/1:16:59 PM    Final     DISCHARGE EXAMINATION: Vitals:   07/09/21 0351 07/09/21 0351 07/09/21 0755 07/09/21 1100  BP:  114/69 103/64 104/64  Pulse:  94 80 80  Resp:  17 17 17   Temp:  98.3 F (36.8 C) 98.7 F (37.1 C) 98.7 F (37.1 C)  TempSrc:  Oral  Oral  SpO2:  90% 94% 94%  Weight: 44.1 kg     Height:       General appearance: Awake alert.  In no distress Resp: Clear to auscultation bilaterally.  Normal effort Cardio: S1-S2 is normal regular.  No S3-S4.  No rubs murmurs or bruit GI: Abdomen is soft.  Nontender nondistended.  Bowel sounds are present normal.  No masses organomegaly    DISPOSITION: Home  Discharge Instructions     (HEART FAILURE PATIENTS) Call MD:  Anytime you have any of the following symptoms: 1) 3 pound weight gain in 24 hours or 5 pounds in 1 week 2) shortness of breath, with or without a dry hacking cough 3) swelling in the hands, feet or stomach 4) if you have to sleep on extra pillows at night in order to breathe.   Complete by: As directed    Call  MD for:  difficulty breathing, headache or visual disturbances   Complete by: As directed    Call MD for:  extreme fatigue   Complete by: As directed    Call MD for:  persistant dizziness or light-headedness   Complete by: As directed    Call MD for:  persistant nausea and vomiting   Complete by: As directed    Call MD for:  severe uncontrolled pain   Complete by: As directed    Call MD for:   temperature >100.4   Complete by: As directed    Diet - low sodium heart healthy   Complete by: As directed    Discharge instructions   Complete by: As directed    Please take your medications as prescribed.  Seek attention if your shortness of breath or chest pain worsen.  Follow-up with Primary care provider and with the cardiologist.  Also follow-up with your gastroenterologist for further issues with diarrhea.  Take Imodium as needed.  You were cared for by a hospitalist during your hospital stay. If you have any questions about your discharge medications or the care you received while you were in the hospital after you are discharged, you can call the unit and asked to speak with the hospitalist on call if the hospitalist that took care of you is not available. Once you are discharged, your primary care physician will handle any further medical issues. Please note that NO REFILLS for any discharge medications will be authorized once you are discharged, as it is imperative that you return to your primary care physician (or establish a relationship with a primary care physician if you do not have one) for your aftercare needs so that they can reassess your need for medications and monitor your lab values. If you do not have a primary care physician, you can call [phone] for a physician referral.   Increase activity slowly   Complete by: As directed    No wound care   Complete by: As directed          Allergies as of 07/09/2021   No Known Allergies      Medication List     STOP taking these medications    Biotin 1000 MCG tablet   Potassium 99 MG Tabs       TAKE these medications    amitriptyline 25 MG tablet Commonly known as: ELAVIL Take 1 tablet (25 mg total) by mouth at bedtime.   amoxicillin-clavulanate 875-125 MG tablet Commonly known as: AUGMENTIN Take 1 tablet by mouth every 12 (twelve) hours for 2 days.   aspirin 81 MG EC tablet Take 1 tablet (81 mg total) by  mouth daily. Swallow whole. Start taking on: July 10, 2021   busPIRone 10 MG tablet Commonly known as: BUSPAR Take 1 tablet (10 mg total) by mouth 3 (three) times daily.   calcium carbonate 1250 (500 Ca) MG tablet Commonly known as: OS-CAL - dosed in mg of elemental calcium Take 1 tablet by mouth as directed.   folic acid 1 MG tablet Commonly known as: FOLVITE Take 1 tablet (1 mg total) by mouth daily. Start taking on: July 10, 2021   hydrOXYzine 50 MG tablet Commonly known as: ATARAX/VISTARIL Take 2 tablets (100 mg total) by mouth 3 (three) times daily as needed for anxiety.   lisinopril 2.5 MG tablet Commonly known as: ZESTRIL Take 1 tablet (2.5 mg total) by mouth daily. Start taking on: July 10, 2021   loperamide 2 MG capsule  Commonly known as: IMODIUM Take 1 capsule (2 mg total) by mouth 4 (four) times daily as needed for diarrhea or loose stools (do not administer within 2 hrs of previous dose).   Magnesium 500 MG Tabs Take 1 tablet by mouth daily.   metoprolol succinate 25 MG 24 hr tablet Commonly known as: TOPROL-XL Take 0.5 tablets (12.5 mg total) by mouth daily. Start taking on: July 10, 2021   midodrine 10 MG tablet Commonly known as: PROAMATINE Take 1 tablet (10 mg total) by mouth 3 (three) times daily with meals.   oxyCODONE 5 MG immediate release tablet Commonly known as: Oxy IR/ROXICODONE Take 1 tablet (5 mg total) by mouth every 6 (six) hours as needed for moderate pain.   pantoprazole 40 MG tablet Commonly known as: PROTONIX Take 1 tablet (40 mg total) by mouth daily.   QUEtiapine 100 MG tablet Commonly known as: SEROQUEL Take 1 tablet (100 mg total) by mouth 3 (three) times daily.   thiamine 100 MG tablet Take 1 tablet (100 mg total) by mouth daily. Start taking on: July 10, 2021   traZODone 50 MG tablet Commonly known as: DESYREL Take 1 tablet (50 mg total) by mouth at bedtime. What changed:  when to take  this Another medication with the same name was removed. Continue taking this medication, and follow the directions you see here.   vitamin B-12 1000 MCG tablet Commonly known as: CYANOCOBALAMIN Take 1,000 mcg by mouth daily.   WOMENS 50+ ADVANCED PO Take 1 tablet by mouth daily.          Follow-up Information     [name], MD. Schedule an appointment as soon as possible for a visit in 1 week(s).   Specialty: Family Medicine Why: Patient will need to make a follow up appointment. Contact information: [place] [address] [phone]         Isaias Cowman, MD Follow up on 07/26/2021.   Specialty: Cardiology Why: @ 10:30am Contact information: [facility] [place] [address] [phone]         Virgel Manifold, MD. Schedule an appointment as soon as possible for a visit in 2 week(s).   Specialty: Gastroenterology Why: Voice mail.Marland KitchenMarland KitchenPatient will need to make a follow up appointment. Contact information: [place] [address] [phone]                 TOTAL DISCHARGE TIME: 50 minutes  [facility] Pager on [email or web address]  07/09/2021, 12:45 PM

## 2021-07-09 NOTE — Progress Notes (Signed)
Patient alert and oriented x 4. No complaints at this time. No change from previous assessment. Being discharged home and mother and son are in the room to transport her home.

## 2021-07-11 ENCOUNTER — Ambulatory Visit (INDEPENDENT_AMBULATORY_CARE_PROVIDER_SITE_OTHER): Payer: No Typology Code available for payment source | Admitting: Family Medicine

## 2021-07-11 ENCOUNTER — Other Ambulatory Visit: Payer: Self-pay

## 2021-07-11 ENCOUNTER — Encounter: Payer: Self-pay | Admitting: Family Medicine

## 2021-07-11 ENCOUNTER — Telehealth: Payer: Self-pay

## 2021-07-11 VITALS — BP 80/60 | HR 60 | Temp 97.0°F | Ht 63.0 in | Wt 92.5 lb

## 2021-07-11 DIAGNOSIS — I9589 Other hypotension: Secondary | ICD-10-CM | POA: Diagnosis not present

## 2021-07-11 DIAGNOSIS — F3340 Major depressive disorder, recurrent, in remission, unspecified: Secondary | ICD-10-CM | POA: Diagnosis not present

## 2021-07-11 DIAGNOSIS — I252 Old myocardial infarction: Secondary | ICD-10-CM | POA: Insufficient documentation

## 2021-07-11 DIAGNOSIS — I5023 Acute on chronic systolic (congestive) heart failure: Secondary | ICD-10-CM

## 2021-07-11 DIAGNOSIS — F339 Major depressive disorder, recurrent, unspecified: Secondary | ICD-10-CM | POA: Insufficient documentation

## 2021-07-11 DIAGNOSIS — F1010 Alcohol abuse, uncomplicated: Secondary | ICD-10-CM

## 2021-07-11 DIAGNOSIS — I509 Heart failure, unspecified: Secondary | ICD-10-CM | POA: Insufficient documentation

## 2021-07-11 DIAGNOSIS — G4700 Insomnia, unspecified: Secondary | ICD-10-CM

## 2021-07-11 DIAGNOSIS — R9389 Abnormal findings on diagnostic imaging of other specified body structures: Secondary | ICD-10-CM | POA: Diagnosis not present

## 2021-07-11 MED ORDER — TRAZODONE HCL 100 MG PO TABS: 100.0000 mg | ORAL_TABLET | Freq: Every day | ORAL | 0 refills | Status: DC

## 2021-07-11 NOTE — Assessment & Plan Note (Signed)
Poorly controlled. She reports anxiety is worse and would like to restart trazodone. Discussed why this was stopped (prolonged qtc) and reasoning as well as risk. Reviewed most recent EKG with improvement and normal qtc. Discussed nyquil and detla 8 likely contributed she denied using. As dose dependent for trazodone discussed with normal on recent OK with increasing trazodone 50>100 mg. Will also try to get her to psych ASAP given complex psych hx and made more complex by prolonged qtc.

## 2021-07-11 NOTE — Assessment & Plan Note (Addendum)
New diagnosis. Reviewed hospital stay with EF 25%. Reviewed this was likely the cause of her dyspnea and fatigue. Cont lisinopril 2.5 mg and metoprolol 12.5 mg. Lungs clear so do not feel diuretics would be helpful. Has Cardiology f/u in 2 weeks, appreciate support. HH PT/OT placed for hospital recovery

## 2021-07-11 NOTE — Patient Instructions (Addendum)
#  Trazodone - ok to increase to 100 mg - will work to get you into psych  #blood pressure - will check with cardiology   Major Diagnosis - Heart Failure reduce function  ---> why you are on metoprolol and lisinopril ---->Minodrine is to help raise your blood pressure -- Likely the cause of your fatigue, shortness of breath  - NSTEMI - but your arteries looked ok without build-up -->on Aspirin and Metoprolol -->unfortunately delaying GI work  Q-T prolongation  - likely due to psych meds + nyquil + delta H - improved but limits amount of trazodone - will get psych support to help with anxiety treatment moving forward   #Back imaging - possible nerve sheath tumor Scalloping of the posterior endplate of the L5 vertebral body with widening of the neural foramen raises suspicion for a peripheral nerve sheath tumor. Recommend nonemergent lumbar spine MRI with and without contrast for further evaluation.

## 2021-07-11 NOTE — Assessment & Plan Note (Signed)
Pt went through withdrawal during her stay. Denies any use since discharge. Encouraged continued abstinence.

## 2021-07-11 NOTE — Telephone Encounter (Signed)
Transition Care Management Follow-up Telephone Call Date of discharge and from where: 07/09/2021  [facility] How have you been since you were released from the hospital? " Weak and tired" Any questions or concerns? Yes  Reports she left her purple sleeping pillow and wants it back. I provided patient with the hospital phone number and requested to call the unit she was on and ask about it.   Items Reviewed: Did the pt receive and understand the discharge instructions provided? Yes  Medications obtained and verified? Yes  Other? No  Any new allergies since your discharge? No  Dietary orders reviewed? Yes Do you have support at home? Yes   Home Care and Equipment/Supplies: Were home health services ordered? no If so, what is the name of the agency?  Has the agency set up a time to come to the patient's home? not applicable Were any new equipment or medical supplies ordered?  No What is the name of the medical supply agency?  Were you able to get the supplies/equipment? not applicable Do you have any questions related to the use of the equipment or supplies? No  Functional Questionnaire: (I = Independent and D = Dependent) ADLs: I  Bathing/Dressing- I  Meal Prep- I  Eating- I   Reports drinking protein shakes and 100 % juice with oatmeal  Maintaining continence- I  Transferring/Ambulation- I  Managing Meds- I  Follow up appointments reviewed:  PCP Hospital f/u appt confirmed? Yes  Scheduled to see PCP on 07/11/2021 @ 2:20 pm. [facility] f/u appt confirmed? Yes  Scheduled to see cardiology  on 07/26/2021 @ 1030. Are transportation arrangements needed? No  If their condition worsens, is the pt aware to call PCP or go to the Emergency Dept.? Yes Was the patient provided with contact information for the PCP's office or ED? Yes Was to pt encouraged to call back with questions or concerns? Yes  Encouraged patient to ask all questions needed at office visit today.  Tomasa Rand,  RN, BSN, CEN [facility] [employer] [phone]

## 2021-07-11 NOTE — Assessment & Plan Note (Signed)
Reviewed CT scan with patient and concern for possible neural sheath tumor. Discussed that this is only a possibility. Unfortunately she had a severe panic reaction the last attempt at an MRI and believes even anti-anxiety medication will not be enough. Ordered Lumbar MRI w/ general sedation. Discussed anticipating waiting until he heart is stable and she if is feeling better.

## 2021-07-11 NOTE — Progress Notes (Addendum)
Subjective:     Carla Martinez is a 60 y.o. female presenting for Hospitalization Follow-up     HPI  #Insomnia/psych concerns - trazodone was decreased to 50 mg once daily   #CHF - very shaky and weak - sob - getting chest tightness if she lays on her side and has trouble breathing - 1-2 pillows at night - endorses PND  Very weak and shaky - this was what lead to her going to the hospital   She notes she had a panic episode in the MRI - complete panic attack Recommend nonemergent lumbar spine MRI with and without contrast for further evaluation.  Review of Systems  9/13-9/20/2022: Admission - Metabolic encephalopathy/drug/alcohol withdrawal - saw psych and medications stopped but eventually started on Buspar. Prolonged QT. Hypotension - started on ACE and beta-blocker with CHF/NSTEMI EF 25-30% suspect Takotsubo's. Afib no need for anticoag other than ASA. N/V - delay GI work-up due to NSTEMI and CHF  Social History   Tobacco Use  Smoking Status Every Day   Packs/day: 0.25   Years: 17.00   Pack years: 4.25   Types: Cigarettes   Start date: 11/25/2003  Smokeless Tobacco Never  Tobacco Comments   used to smoke 1PPD but has decreased. Down to 1-2 cigarettes daily        Objective:    BP Readings from Last 3 Encounters:  07/11/21 (!) 80/60  07/09/21 104/64  06/04/21 91/64   Wt Readings from Last 3 Encounters:  07/11/21 92 lb 8 oz (42 kg)  07/09/21 97 lb 4.8 oz (44.1 kg)  06/04/21 89 lb 2 oz (40.4 kg)    BP (!) 80/60   Pulse 60   Temp (!) 97 F (36.1 C) (Temporal)   Ht 5\' 3"  (1.6 m)   Wt 92 lb 8 oz (42 kg)   SpO2 93%   BMI 16.39 kg/m    Physical Exam Constitutional:      General: She is not in acute distress.    Appearance: She is well-developed. She is not diaphoretic.     Comments: Thin, wearing only socks  HENT:     Right Ear: External ear normal.     Left Ear: External ear normal.  Eyes:     Conjunctiva/sclera: Conjunctivae normal.   Cardiovascular:     Rate and Rhythm: Normal rate and regular rhythm.     Heart sounds: Murmur heard.  Pulmonary:     Effort: Pulmonary effort is normal. No respiratory distress.     Breath sounds: Normal breath sounds. No wheezing, rhonchi or rales.  Musculoskeletal:     Cervical back: Neck supple.  Skin:    General: Skin is warm and dry.     Capillary Refill: Capillary refill takes less than 2 seconds.  Neurological:     Mental Status: She is alert. Mental status is at baseline.  Psychiatric:        Mood and Affect: Mood normal.        Behavior: Behavior normal.          Assessment & Plan:   Problem List Items Addressed This Visit       Cardiovascular and Mediastinum   Hypotension, chronic (Chronic)    BP lower than normal today. Currently without new symptoms other than fatigue. She is on metoprolol and lisinopril for HF and reports she has been also been taking midodrine as prescribed. As no new or worsening symptoms will reach out to cardiology for recommendation. Encouraged continued eating  and hydration      Acute on chronic systolic congestive heart failure ([place])    New diagnosis. Reviewed hospital stay with EF 25%. Reviewed this was likely the cause of her dyspnea and fatigue. Cont lisinopril 2.5 mg and metoprolol 12.5 mg. Lungs clear so do not feel diuretics would be helpful. Has Cardiology f/u in 2 weeks, appreciate support. HH PT/OT placed for hospital recovery      Relevant Orders   Ambulatory referral to [facility]     Other   Insomnia (Chronic)    Poorly controlled. Was on trazodone 300 mg at night, now decreased to 50 mg 2/2 to prolonged qtc. Increase to 100 mg and return in 2 weeks for EKG and reassess.       Alcohol abuse    Pt went through withdrawal during her stay. Denies any use since discharge. Encouraged continued abstinence.       Abnormal CT scan - Primary    Reviewed CT scan with patient and concern for possible neural sheath tumor.  Discussed that this is only a possibility. Unfortunately she had a severe panic reaction the last attempt at an MRI and believes even anti-anxiety medication will not be enough. Ordered Lumbar MRI w/ general sedation. Discussed anticipating waiting until he heart is stable and she if is feeling better.        Relevant Orders   MR Lumbar Spine W Wo Contrast   Major depressive disorder, recurrent episode with anxious distress ([place])    Poorly controlled. She reports anxiety is worse and would like to restart trazodone. Discussed why this was stopped (prolonged qtc) and reasoning as well as risk. Reviewed most recent EKG with improvement and normal qtc. Discussed nyquil and detla 8 likely contributed she denied using. As dose dependent for trazodone discussed with normal on recent OK with increasing trazodone 50>100 mg. Will also try to get her to psych ASAP given complex psych hx and made more complex by prolonged qtc.       Relevant Medications   traZODone (DESYREL) 100 MG tablet   Hx of non-ST elevation myocardial infarction (NSTEMI)    Discussed hospital stay and treatment with asa and metoprolol. Reviewed cath with lack of significant CV disease.       Addendum: heard back from her Cardiologist - attempted to call pt was disconnected. Will send Mychart message to advise holding lisinopril due to low blood pressure  I spent >40 minutes with pt , obtaining history, examining, reviewing chart, documenting encounter and discussing the above plan of care.   Return in about 2 weeks (around 07/25/2021) for if unable to initiate specialty care.  [name], MD  This visit occurred during the SARS-CoV-2 public health emergency.  Safety protocols were in place, including screening questions prior to the visit, additional usage of staff PPE, and extensive cleaning of exam room while observing appropriate contact time as indicated for disinfecting solutions.

## 2021-07-11 NOTE — Assessment & Plan Note (Signed)
BP lower than normal today. Currently without new symptoms other than fatigue. She is on metoprolol and lisinopril for HF and reports she has been also been taking midodrine as prescribed. As no new or worsening symptoms will reach out to cardiology for recommendation. Encouraged continued eating and hydration

## 2021-07-11 NOTE — Assessment & Plan Note (Signed)
Poorly controlled. Was on trazodone 300 mg at night, now decreased to 50 mg 2/2 to prolonged qtc. Increase to 100 mg and return in 2 weeks for EKG and reassess.

## 2021-07-11 NOTE — Assessment & Plan Note (Signed)
Discussed hospital stay and treatment with asa and metoprolol. Reviewed cath with lack of significant CV disease.

## 2021-07-12 ENCOUNTER — Other Ambulatory Visit: Payer: Self-pay | Admitting: Family Medicine

## 2021-07-12 DIAGNOSIS — F3340 Major depressive disorder, recurrent, in remission, unspecified: Secondary | ICD-10-CM

## 2021-07-12 NOTE — Telephone Encounter (Signed)
Pt called requiring about her medication traZODone (DESYREL) 100 MG tablet

## 2021-07-12 NOTE — Telephone Encounter (Signed)
Patient called and informed that I called her pharmacy and resubmitted it verbally due to them not receiving it electronically. Patient stated her appreciation and gratitude for making sure it was complete right then.

## 2021-07-15 ENCOUNTER — Telehealth: Payer: Self-pay | Admitting: Family Medicine

## 2021-07-15 DIAGNOSIS — F4321 Adjustment disorder with depressed mood: Secondary | ICD-10-CM

## 2021-07-15 DIAGNOSIS — F411 Generalized anxiety disorder: Secondary | ICD-10-CM

## 2021-07-15 DIAGNOSIS — F330 Major depressive disorder, recurrent, mild: Secondary | ICD-10-CM

## 2021-07-15 DIAGNOSIS — K769 Liver disease, unspecified: Secondary | ICD-10-CM

## 2021-07-15 NOTE — Telephone Encounter (Signed)
Pt called stating she had appt with Einar Pheasant and would like a call back regarding some question she didn't ask

## 2021-07-17 NOTE — Telephone Encounter (Signed)
Pt called advising about her liver. Pt also states that they cant take her for her GI appointment until after December.Please advise.

## 2021-07-18 NOTE — Telephone Encounter (Signed)
Spoke to pt and she has 3 main concerns. First she would like a STAT referral put in for psychiatry to [facility] [phone]) Second, She can't get an appt with the GI dr who saw her in the hospital until December but she did get an appt at [facility] in November. I asked pt to call GI back and see if they can put her on a waiting/cancellation list, so she states she will that.  3rd, pt states that her mom was told from a dr in ER that pt has a lesion on her liver. I found in pt's chart: Indeterminate hypodense lesion in the liver with peripheral nodular enhancement most likely reflects a hemangioma; however, recommend nonemergent outpatient liver protocol MRI of the abdomen with and without contrast for characterization. Pt wants to know if this is going to be addressed with further testing.   Please advise. Pt states that we can send her a reply via mychart.

## 2021-07-18 NOTE — Telephone Encounter (Signed)
Spoke to pt and she said she was at a store at the moment and would like a call back in an hour. Informed pt that I would be in a meeting at that time but I will call her back between 2 and 5 today.

## 2021-07-18 NOTE — Telephone Encounter (Signed)
Pt called in stating she has not heard from our office. Please contact patient as she has questions for Cody's assistant.

## 2021-07-18 NOTE — Telephone Encounter (Signed)
MRI ordered - pt will need sedation and has a lumbar spine MRI ordered as well - ideally will schedule together.   Psych - reviewed prior referral which appears to have another declining. Pt requested [facility] though unclear if taking patients. Routing to referral to team to find a spot for psych asap

## 2021-07-18 NOTE — Addendum Note (Signed)
Addended by: [name] on: 07/18/2021 02:57 PM   Modules accepted: Orders

## 2021-07-22 ENCOUNTER — Telehealth: Payer: Self-pay | Admitting: Family Medicine

## 2021-07-22 NOTE — Telephone Encounter (Signed)
Pt called and would like for you to call her, she would like to ask you some questions.

## 2021-07-24 ENCOUNTER — Ambulatory Visit (INDEPENDENT_AMBULATORY_CARE_PROVIDER_SITE_OTHER): Payer: No Typology Code available for payment source | Admitting: Family Medicine

## 2021-07-24 ENCOUNTER — Telehealth: Payer: Self-pay | Admitting: Family Medicine

## 2021-07-24 ENCOUNTER — Other Ambulatory Visit: Payer: Self-pay

## 2021-07-24 VITALS — BP 100/62 | HR 100 | Temp 97.0°F | Ht 63.0 in | Wt 85.4 lb

## 2021-07-24 DIAGNOSIS — E440 Moderate protein-calorie malnutrition: Secondary | ICD-10-CM

## 2021-07-24 DIAGNOSIS — R251 Tremor, unspecified: Secondary | ICD-10-CM | POA: Diagnosis not present

## 2021-07-24 DIAGNOSIS — R2689 Other abnormalities of gait and mobility: Secondary | ICD-10-CM | POA: Insufficient documentation

## 2021-07-24 DIAGNOSIS — G4700 Insomnia, unspecified: Secondary | ICD-10-CM

## 2021-07-24 DIAGNOSIS — I5023 Acute on chronic systolic (congestive) heart failure: Secondary | ICD-10-CM

## 2021-07-24 DIAGNOSIS — I4891 Unspecified atrial fibrillation: Secondary | ICD-10-CM | POA: Diagnosis not present

## 2021-07-24 DIAGNOSIS — F411 Generalized anxiety disorder: Secondary | ICD-10-CM

## 2021-07-24 LAB — CBC WITH DIFFERENTIAL/PLATELET
Basophils Absolute: 0.1 K/uL (ref 0.0–0.1)
Basophils Relative: 0.6 % (ref 0.0–3.0)
Eosinophils Absolute: 0 K/uL (ref 0.0–0.7)
Eosinophils Relative: 0.5 % (ref 0.0–5.0)
HCT: 37 % (ref 36.0–46.0)
Hemoglobin: 12 g/dL (ref 12.0–15.0)
Lymphocytes Relative: 25.6 % (ref 12.0–46.0)
Lymphs Abs: 2.2 K/uL (ref 0.7–4.0)
MCHC: 32.3 g/dL (ref 30.0–36.0)
MCV: 95.1 fl (ref 78.0–100.0)
Monocytes Absolute: 0.4 K/uL (ref 0.1–1.0)
Monocytes Relative: 4.1 % (ref 3.0–12.0)
Neutro Abs: 6.1 K/uL (ref 1.4–7.7)
Neutrophils Relative %: 69.2 % (ref 43.0–77.0)
Platelets: 432 K/uL — ABNORMAL HIGH (ref 150.0–400.0)
RBC: 3.89 Mil/uL (ref 3.87–5.11)
RDW: 13.5 % (ref 11.5–15.5)
WBC: 8.8 K/uL (ref 4.0–10.5)

## 2021-07-24 LAB — COMPREHENSIVE METABOLIC PANEL
ALT: 10 U/L (ref 0–35)
AST: 14 U/L (ref 0–37)
Albumin: 3.8 g/dL (ref 3.5–5.2)
Alkaline Phosphatase: 56 U/L (ref 39–117)
BUN: 14 mg/dL (ref 6–23)
CO2: 30 mEq/L (ref 19–32)
Calcium: 9.5 mg/dL (ref 8.4–10.5)
Chloride: 99 mEq/L (ref 96–112)
Creatinine, Ser: 0.92 mg/dL (ref 0.40–1.20)
GFR: 68.97 mL/min (ref >60.00)
Glucose, Bld: 97 mg/dL (ref 70–99)
Potassium: 4 mEq/L (ref 3.5–5.1)
Sodium: 135 mEq/L (ref 135–145)
Total Bilirubin: 0.2 mg/dL (ref 0.2–1.2)
Total Protein: 6.3 g/dL (ref 6.0–8.3)

## 2021-07-24 LAB — TSH: TSH: 3.03 u[IU]/mL (ref 0.35–5.50)

## 2021-07-24 LAB — VITAMIN B12: Vitamin B-12: 662 pg/mL (ref 211–911)

## 2021-07-24 NOTE — Patient Instructions (Addendum)
#  Tremors - labs today - Try to increase intake - 3 boost per day in addition to any food - try to eat something every 2 hours - 6 small meals - if labs normal - will probably plan neurology referral

## 2021-07-24 NOTE — Assessment & Plan Note (Signed)
Weight down 7% and BMI 15 on exam today. Suspect not enough intake. Encouraged 6 small meals with 3 boost per day. Check blood work.

## 2021-07-24 NOTE — Assessment & Plan Note (Signed)
BP improved after holding lisinopril. Cont metoprolol. Weight down today.

## 2021-07-24 NOTE — Telephone Encounter (Signed)
Spoke to pt and she is scheduled for an appt today at 11:40 and will ask her questions then.

## 2021-07-24 NOTE — Assessment & Plan Note (Signed)
Still awaiting psych referral. Placed as urgent and declined by several providers. Will f/u with referral coordinators.

## 2021-07-24 NOTE — Progress Notes (Signed)
Subjective:     Carla Martinez is a 60 y.o. female presenting for Tremors (In legs and arms. Has gotten increasingly worse since discharge of hospital. )     HPI  # Tremors - eating - had bowl of oatmeal for dinner, scrambled eggs 2, 2 boost supplements, popcorn - 1 cup - Everyday: gets 1 boost - yesterday was more than normal - poor appetite is limiting her eating - no nausea/vomiting/reflux - trying to force herself to eating - whole body seems to be shaking - feels tired - if sitting - may have some hand shaking but otherwise calm - endorses cold sweats - all day - tremor worse with standing/walking - whole body will shake    Review of Systems   Social History   Tobacco Use  Smoking Status Every Day   Packs/day: 0.25   Years: 17.00   Pack years: 4.25   Types: Cigarettes   Start date: 11/25/2003  Smokeless Tobacco Never  Tobacco Comments   used to smoke 1PPD but has decreased. Down to 1-2 cigarettes daily        Objective:    BP Readings from Last 3 Encounters:  07/24/21 100/62  07/11/21 (!) 80/60  07/09/21 104/64   Wt Readings from Last 3 Encounters:  07/24/21 85 lb 6 oz (38.7 kg)  07/11/21 92 lb 8 oz (42 kg)  07/09/21 97 lb 4.8 oz (44.1 kg)    BP 100/62   Pulse 100   Temp (!) 97 F (36.1 C) (Temporal)   Ht 5\' 3"  (1.6 m)   Wt 85 lb 6 oz (38.7 kg)   SpO2 97%   BMI 15.12 kg/m    Physical Exam Constitutional:      General: She is not in acute distress.    Appearance: She is well-developed. She is cachectic. She is not diaphoretic.  HENT:     Right Ear: External ear normal.     Left Ear: External ear normal.     Nose: Nose normal.  Eyes:     Conjunctiva/sclera: Conjunctivae normal.  Cardiovascular:     Rate and Rhythm: Normal rate and regular rhythm.  Pulmonary:     Effort: Pulmonary effort is normal. No respiratory distress.     Breath sounds: Normal breath sounds. No wheezing.  Musculoskeletal:     Cervical back: Neck supple.   Skin:    General: Skin is warm and dry.     Capillary Refill: Capillary refill takes less than 2 seconds.  Neurological:     Mental Status: She is alert. Mental status is at baseline.     Sensory: Sensation is intact.     Motor: Motor function is intact. No weakness.     Coordination: Coordination is intact. Finger-Nose-Finger Test (slight hand tremor with testing) normal.     Comments: Unsteady with ambulation with cane. No shuffling, but upper and lower body is shaking and pt sits quickly.   Psychiatric:        Mood and Affect: Mood normal.        Behavior: Behavior normal.          Assessment & Plan:   Problem List Items Addressed This Visit       Cardiovascular and Mediastinum   Atrial fibrillation with RVR (HCC)    On metoprolol 25 mg, mild tachycardia today though RRR on exam      Acute on chronic systolic congestive heart failure ([facility])    BP improved after holding lisinopril.  Cont metoprolol. Weight down today.         Other   Insomnia (Chronic)    No improvement with increased trazodone 50>100 mg. EKG machine down today, would want to recheck prior to increases.  Again discussed sedatives are higher risk for her heart due to hx of prolonged QTC      Generalized anxiety disorder    Still awaiting psych referral. Placed as urgent and declined by several providers. Will f/u with referral coordinators.       Moderate protein-calorie malnutrition ([place])    Weight down 7% and BMI 15 on exam today. Suspect not enough intake. Encouraged 6 small meals with 3 boost per day. Check blood work.       Tremor - Primary    New symptoms since hospital discharge but in setting of 7 lb weight loss and poor PO intake as well as ongoing psych issues. Etiology unclear - and full body. Denies significant anxiety factoring in but also asks for sedative to help (?). Ambulating with walker but eventually needed a wheelchair due to pt concern. Strength normal. Labs today to evaluate for  metabolic cause. HH PT to work on ambulation/strengthening post hospitalization. Encouraged eating more. If labs normal, consider neurology evaluation.       Relevant Orders   Comprehensive metabolic panel   CBC with Differential   TSH   Vitamin B12   Balance problem    2/2 to body tremors. [facility] referral pending (placed 2 weeks ago).         Return if symptoms worsen or fail to improve.  [name], MD  This visit occurred during the SARS-CoV-2 public health emergency.  Safety protocols were in place, including screening questions prior to the visit, additional usage of staff PPE, and extensive cleaning of exam room while observing appropriate contact time as indicated for disinfecting solutions.

## 2021-07-24 NOTE — Assessment & Plan Note (Signed)
No improvement with increased trazodone 50>100 mg. EKG machine down today, would want to recheck prior to increases.  Again discussed sedatives are higher risk for her heart due to hx of prolonged QTC

## 2021-07-24 NOTE — Assessment & Plan Note (Signed)
New symptoms since hospital discharge but in setting of 7 lb weight loss and poor PO intake as well as ongoing psych issues. Etiology unclear - and full body. Denies significant anxiety factoring in but also asks for sedative to help (?). Ambulating with walker but eventually needed a wheelchair due to pt concern. Strength normal. Labs today to evaluate for metabolic cause. HH PT to work on ambulation/strengthening post hospitalization. Encouraged eating more. If labs normal, consider neurology evaluation.

## 2021-07-24 NOTE — Assessment & Plan Note (Signed)
On metoprolol 25 mg, mild tachycardia today though RRR on exam

## 2021-07-24 NOTE — Telephone Encounter (Signed)
Pt seen in office again.   Still has not heard from psych. Ongoing anxiety and severe symptoms.   Routing to referral coordinators for update on psych referral options.

## 2021-07-24 NOTE — Assessment & Plan Note (Signed)
2/2 to body tremors. [facility] referral pending (placed 2 weeks ago).

## 2021-07-29 ENCOUNTER — Other Ambulatory Visit: Payer: Self-pay | Admitting: Family Medicine

## 2021-07-29 DIAGNOSIS — F5000 Anorexia nervosa, unspecified: Secondary | ICD-10-CM

## 2021-07-29 DIAGNOSIS — F330 Major depressive disorder, recurrent, mild: Secondary | ICD-10-CM

## 2021-07-29 NOTE — Telephone Encounter (Signed)
Please call patient to update on referrals.   If we can call and send a referral to one of these locations that has agreed to accept patient, that would be best as would hopefully expedite what was placed as an URGENT referral   If we cannot place referral on her behalf please provide her with the list of alternative locations that she can call.

## 2021-07-30 ENCOUNTER — Telehealth: Payer: Self-pay | Admitting: Family Medicine

## 2021-07-30 LAB — BLOOD GAS, ARTERIAL
Acid-Base Excess: 7.1 mmol/L — ABNORMAL HIGH (ref 0.0–2.0)
Bicarbonate: 29.9 mmol/L — ABNORMAL HIGH (ref 20.0–28.0)
FIO2: 0.4
O2 Saturation: 96.1 %
Patient temperature: 37
pCO2 arterial: 35 mmHg (ref 32.0–48.0)
pH, Arterial: 7.54 — ABNORMAL HIGH (ref 7.350–7.450)
pO2, Arterial: 72 mmHg — ABNORMAL LOW (ref 83.0–108.0)

## 2021-07-30 NOTE — Telephone Encounter (Signed)
Mrs. Carla Martinez called in wanted to talk to Carla Martinez about what is going on physically with her.

## 2021-07-30 NOTE — Telephone Encounter (Signed)
Sent pt a mychart message asking for her to send me a message.

## 2021-07-31 NOTE — Telephone Encounter (Signed)
Carla Martinez with [facility] [phone] states that they are denying the Psychiatric Care. Carla Martinez states that she would need a Neuro Evaluation first.

## 2021-07-31 NOTE — Telephone Encounter (Signed)
Pt replied to [facility] and conversation was forwarded to Dr. Einar Pheasant

## 2021-08-01 ENCOUNTER — Emergency Department: Payer: No Typology Code available for payment source

## 2021-08-01 ENCOUNTER — Encounter: Payer: Self-pay | Admitting: Emergency Medicine

## 2021-08-01 ENCOUNTER — Other Ambulatory Visit: Payer: Self-pay

## 2021-08-01 ENCOUNTER — Emergency Department
Admission: EM | Admit: 2021-08-01 | Discharge: 2021-08-02 | Disposition: A | Discharge status: Other Acute Inpatient Hospital | Payer: No Typology Code available for payment source | Attending: Emergency Medicine | Admitting: Emergency Medicine

## 2021-08-01 DIAGNOSIS — R4689 Other symptoms and signs involving appearance and behavior: Secondary | ICD-10-CM

## 2021-08-01 DIAGNOSIS — F411 Generalized anxiety disorder: Secondary | ICD-10-CM | POA: Diagnosis present

## 2021-08-01 DIAGNOSIS — F419 Anxiety disorder, unspecified: Secondary | ICD-10-CM | POA: Insufficient documentation

## 2021-08-01 DIAGNOSIS — F5000 Anorexia nervosa, unspecified: Secondary | ICD-10-CM | POA: Diagnosis not present

## 2021-08-01 DIAGNOSIS — F4323 Adjustment disorder with mixed anxiety and depressed mood: Secondary | ICD-10-CM | POA: Diagnosis present

## 2021-08-01 DIAGNOSIS — F19982 Other psychoactive substance use, unspecified with psychoactive substance-induced sleep disorder: Secondary | ICD-10-CM | POA: Diagnosis present

## 2021-08-01 DIAGNOSIS — R4182 Altered mental status, unspecified: Secondary | ICD-10-CM | POA: Insufficient documentation

## 2021-08-01 DIAGNOSIS — F1010 Alcohol abuse, uncomplicated: Secondary | ICD-10-CM | POA: Insufficient documentation

## 2021-08-01 DIAGNOSIS — G4700 Insomnia, unspecified: Secondary | ICD-10-CM | POA: Insufficient documentation

## 2021-08-01 DIAGNOSIS — Z789 Other specified health status: Secondary | ICD-10-CM | POA: Diagnosis present

## 2021-08-01 DIAGNOSIS — F29 Unspecified psychosis not due to a substance or known physiological condition: Secondary | ICD-10-CM | POA: Insufficient documentation

## 2021-08-01 DIAGNOSIS — F17210 Nicotine dependence, cigarettes, uncomplicated: Secondary | ICD-10-CM | POA: Diagnosis not present

## 2021-08-01 DIAGNOSIS — Z20822 Contact with and (suspected) exposure to covid-19: Secondary | ICD-10-CM | POA: Diagnosis not present

## 2021-08-01 DIAGNOSIS — I5023 Acute on chronic systolic (congestive) heart failure: Secondary | ICD-10-CM | POA: Diagnosis not present

## 2021-08-01 DIAGNOSIS — R410 Disorientation, unspecified: Secondary | ICD-10-CM | POA: Diagnosis present

## 2021-08-01 DIAGNOSIS — F1290 Cannabis use, unspecified, uncomplicated: Secondary | ICD-10-CM | POA: Diagnosis not present

## 2021-08-01 DIAGNOSIS — F4321 Adjustment disorder with depressed mood: Secondary | ICD-10-CM

## 2021-08-01 DIAGNOSIS — I5181 Takotsubo syndrome: Secondary | ICD-10-CM | POA: Insufficient documentation

## 2021-08-01 DIAGNOSIS — Z7982 Long term (current) use of aspirin: Secondary | ICD-10-CM | POA: Diagnosis not present

## 2021-08-01 DIAGNOSIS — Z634 Disappearance and death of family member: Secondary | ICD-10-CM | POA: Insufficient documentation

## 2021-08-01 DIAGNOSIS — R630 Anorexia: Secondary | ICD-10-CM | POA: Diagnosis present

## 2021-08-01 DIAGNOSIS — F1021 Alcohol dependence, in remission: Secondary | ICD-10-CM | POA: Diagnosis present

## 2021-08-01 LAB — URINE DRUG SCREEN, QUALITATIVE (ARMC ONLY)
Amphetamines, Ur Screen: NOT DETECTED
Barbiturates, Ur Screen: NOT DETECTED
Benzodiazepine, Ur Scrn: NOT DETECTED
Cannabinoid 50 Ng, Ur ~~LOC~~: POSITIVE — AB
Cocaine Metabolite,Ur ~~LOC~~: NOT DETECTED
MDMA (Ecstasy)Ur Screen: NOT DETECTED
Methadone Scn, Ur: NOT DETECTED
Opiate, Ur Screen: NOT DETECTED
Phencyclidine (PCP) Ur S: NOT DETECTED
Tricyclic, Ur Screen: POSITIVE — AB

## 2021-08-01 LAB — CBC
HCT: 34.2 % — ABNORMAL LOW (ref 36.0–46.0)
Hemoglobin: 11.6 g/dL — ABNORMAL LOW (ref 12.0–15.0)
MCH: 32 pg (ref 26.0–34.0)
MCHC: 33.9 g/dL (ref 30.0–36.0)
MCV: 94.5 fL (ref 80.0–100.0)
Platelets: 255 10*3/uL (ref 150–400)
RBC: 3.62 MIL/uL — ABNORMAL LOW (ref 3.87–5.11)
RDW: 12.7 % (ref 11.5–15.5)
WBC: 8.1 10*3/uL (ref 4.0–10.5)
nRBC: 0 % (ref 0.0–0.2)

## 2021-08-01 LAB — URINALYSIS, COMPLETE (UACMP) WITH MICROSCOPIC
Bilirubin Urine: NEGATIVE
Glucose, UA: NEGATIVE mg/dL
Hgb urine dipstick: NEGATIVE
Ketones, ur: NEGATIVE mg/dL
Leukocytes,Ua: NEGATIVE
Nitrite: NEGATIVE
Protein, ur: NEGATIVE mg/dL
Specific Gravity, Urine: 1.006 (ref 1.005–1.030)
pH: 7 (ref 5.0–8.0)

## 2021-08-01 LAB — COMPREHENSIVE METABOLIC PANEL
ALT: 14 U/L (ref 0–44)
AST: 18 U/L (ref 15–41)
Albumin: 3.9 g/dL (ref 3.5–5.0)
Alkaline Phosphatase: 58 U/L (ref 38–126)
Anion gap: 10 (ref 5–15)
BUN: 15 mg/dL (ref 6–20)
CO2: 30 mmol/L (ref 22–32)
Calcium: 9.8 mg/dL (ref 8.9–10.3)
Chloride: 94 mmol/L — ABNORMAL LOW (ref 98–111)
Creatinine, Ser: 1.01 mg/dL — ABNORMAL HIGH (ref 0.44–1.00)
GFR, Estimated: >60 mL/min (ref >60)
Glucose, Bld: 110 mg/dL — ABNORMAL HIGH (ref 70–99)
Potassium: 3.7 mmol/L (ref 3.5–5.1)
Sodium: 134 mmol/L — ABNORMAL LOW (ref 135–145)
Total Bilirubin: 0.4 mg/dL (ref 0.3–1.2)
Total Protein: 6.4 g/dL — ABNORMAL LOW (ref 6.5–8.1)

## 2021-08-01 LAB — SALICYLATE LEVEL: Salicylate Lvl: <7 mg/dL — ABNORMAL LOW (ref 7.0–30.0)

## 2021-08-01 LAB — ACETAMINOPHEN LEVEL: Acetaminophen (Tylenol), Serum: <10 ug/mL — ABNORMAL LOW (ref 10–30)

## 2021-08-01 LAB — ETHANOL: Alcohol, Ethyl (B): <10 mg/dL (ref <10)

## 2021-08-01 LAB — AMMONIA: Ammonia: <10 umol/L (ref 9–35)

## 2021-08-01 IMAGING — CR DG CHEST 2V
1 series · 2 of 2 positions shown · non-contrast
Comparison: [DATE]

CLINICAL DATA: Confusion

EXAM:
CHEST - 2 VIEW

[Series 1: dg chest 2 view · 0.14mm/px · 2 of 2 slices shown]
[im 1/2]
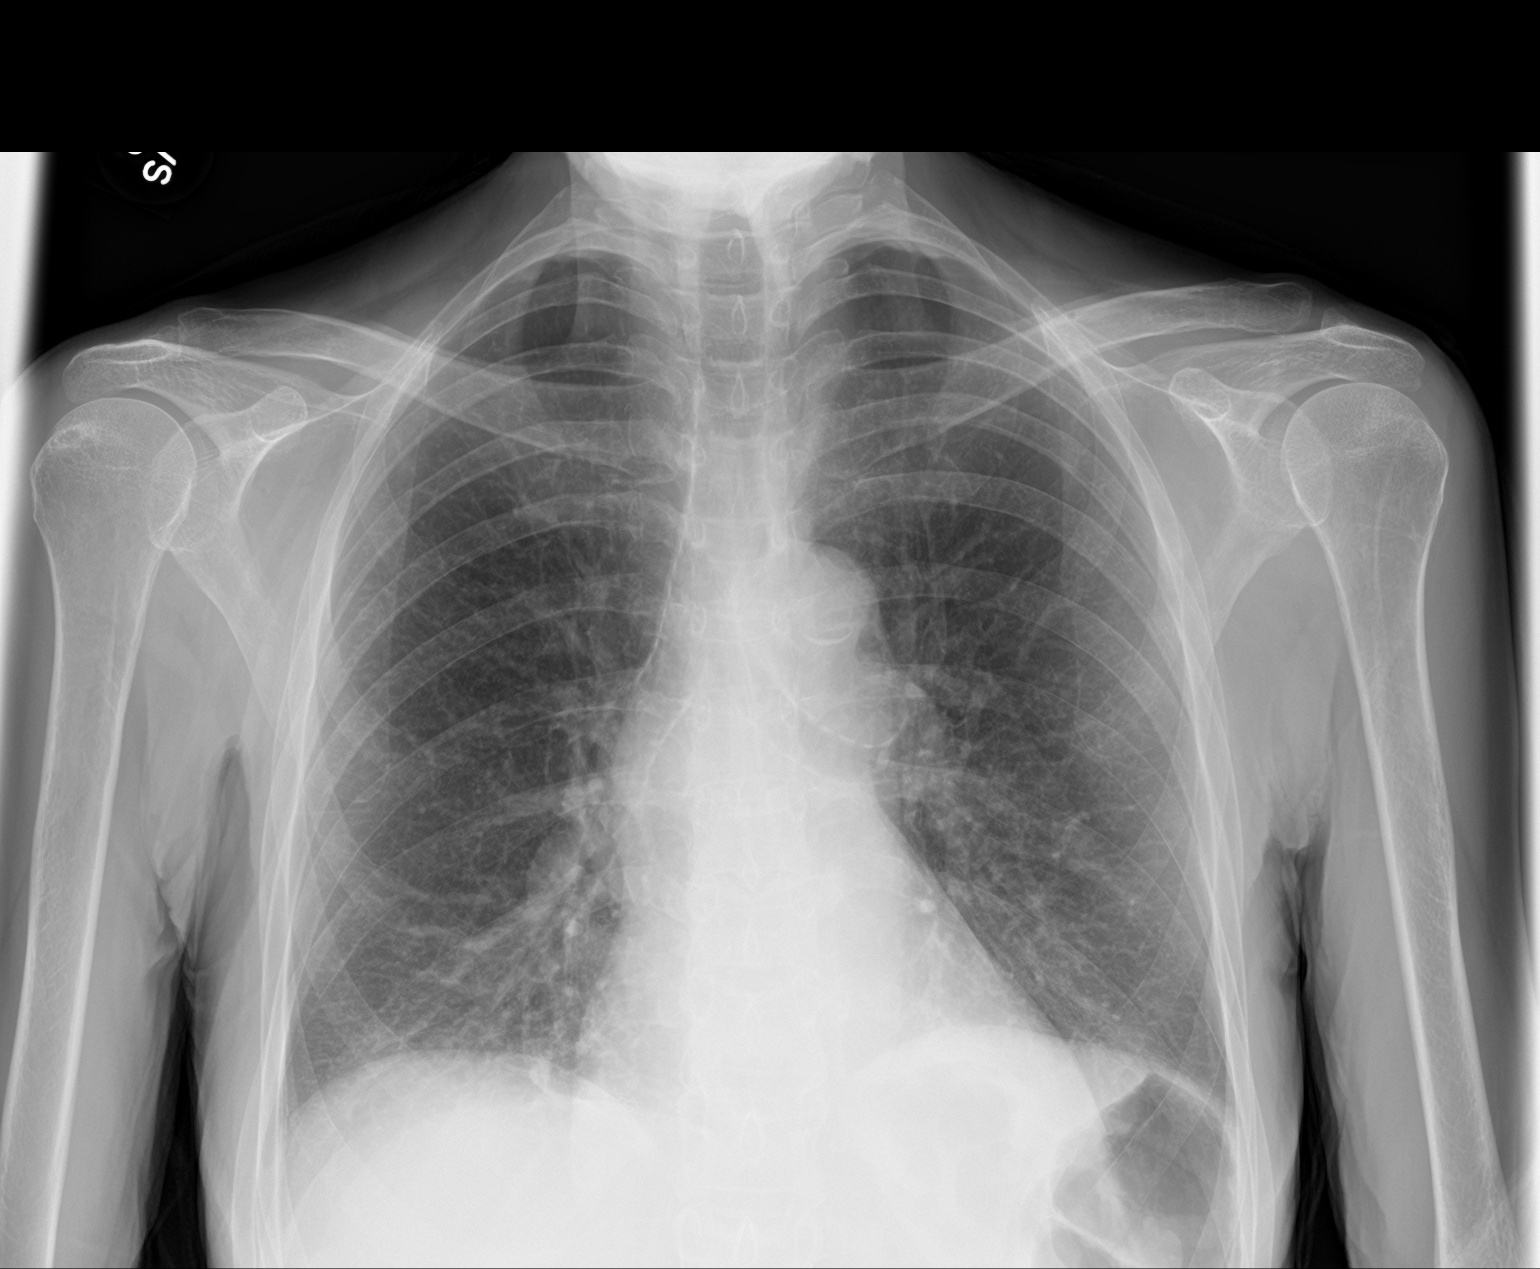
[im 2/2]
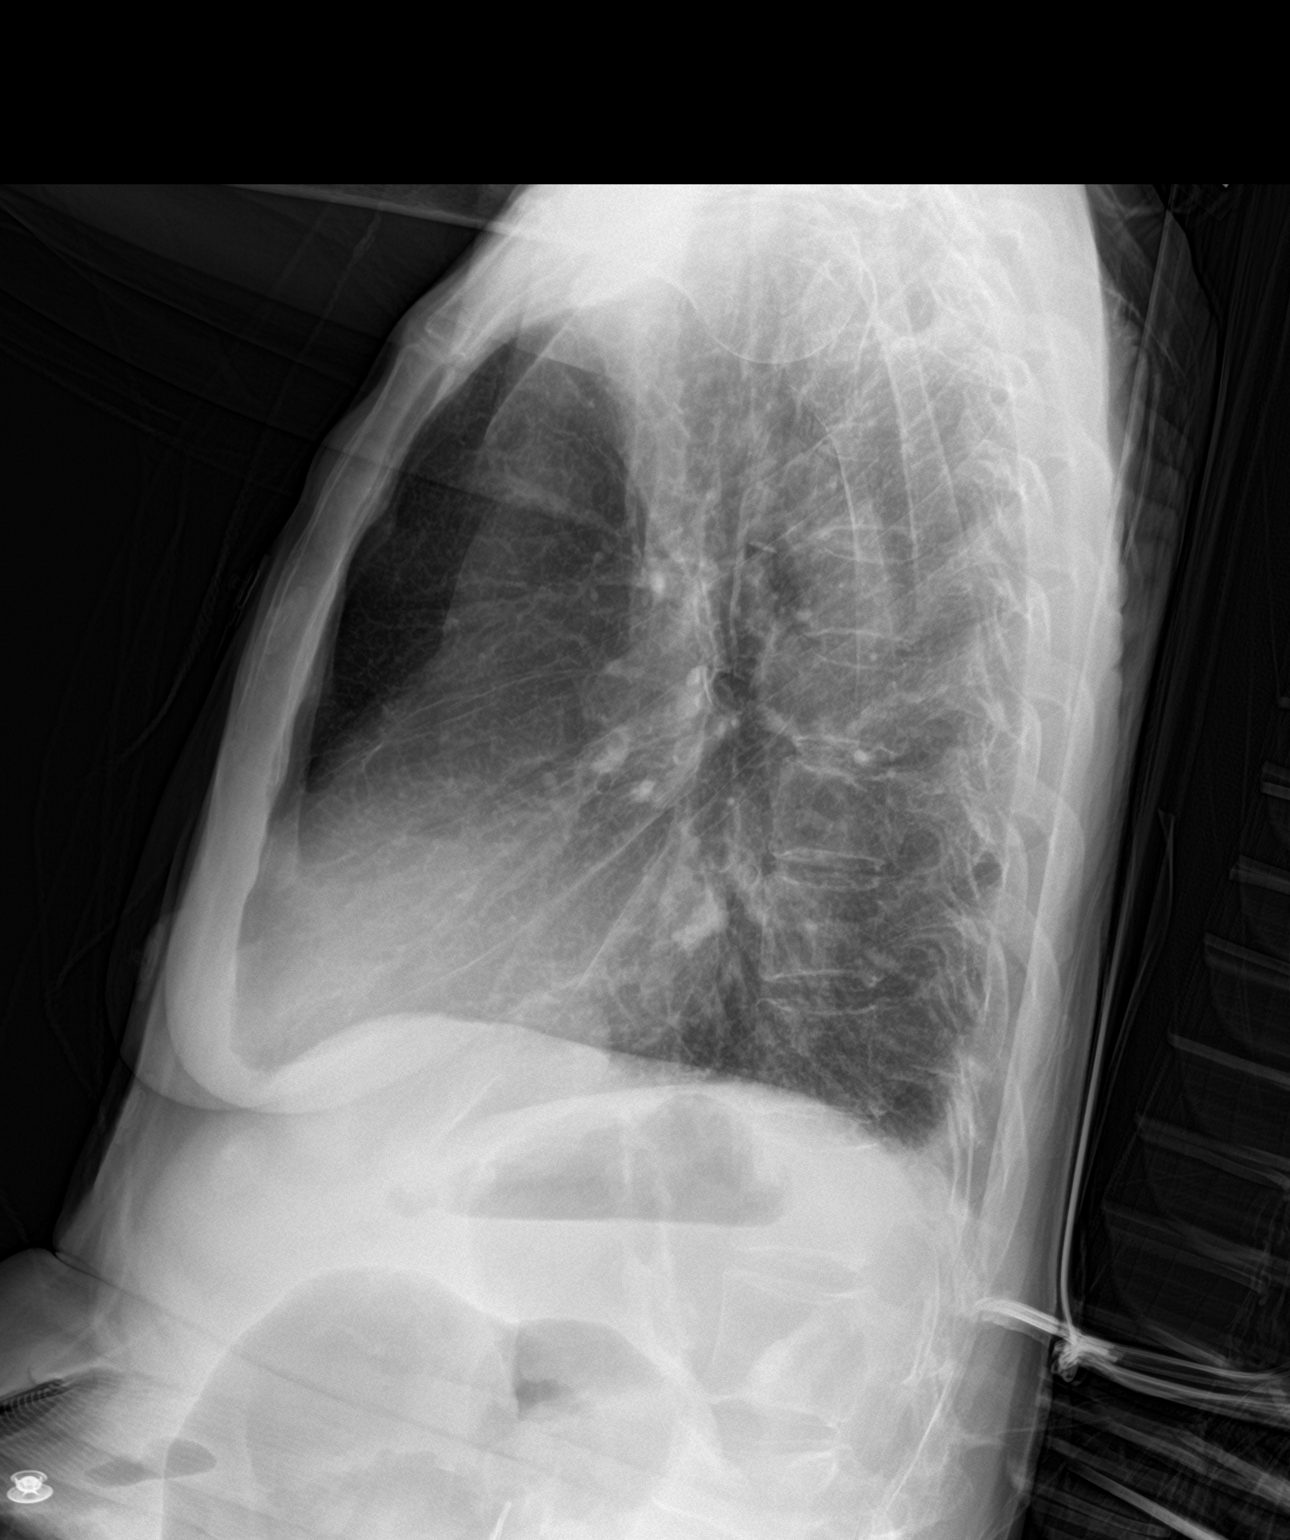

[2 of 2 positions shown; findings below may reference images not displayed]

FINDINGS: Heart size and vascularity normal.

Interval improvement in bibasilar airspace disease which may be
clearing pneumonia. No significant effusion.
IMPRESSION: Improving bibasilar infiltrates.

## 2021-08-01 IMAGING — CT CT HEAD W/O CM
3 of 4 series · 16 of 47 positions shown, 19 images · non-contrast
Comparison: None.

CLINICAL DATA: Mental status change, unknown cause, confusion

EXAM:
CT HEAD WITHOUT CONTRAST
TECHNIQUE: Contiguous axial images were obtained from the base of the skull
through the vertex without intravenous contrast.

[Series 3: head wo · axial · 0.42mm/px · z∈[+314,+454]mm · 10 of 34 slices shown, 13 images]
[im 3/34  brain]
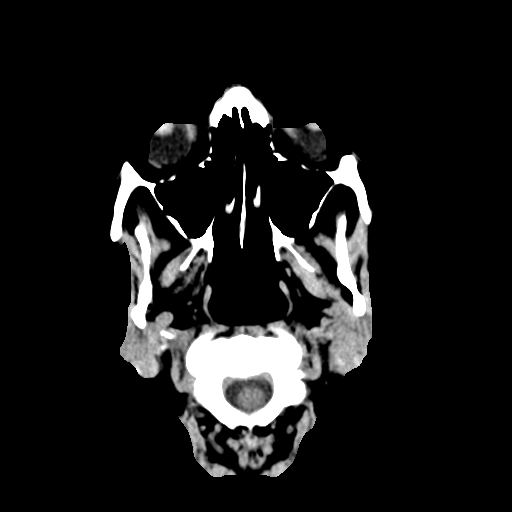
[im 3/34  bone]
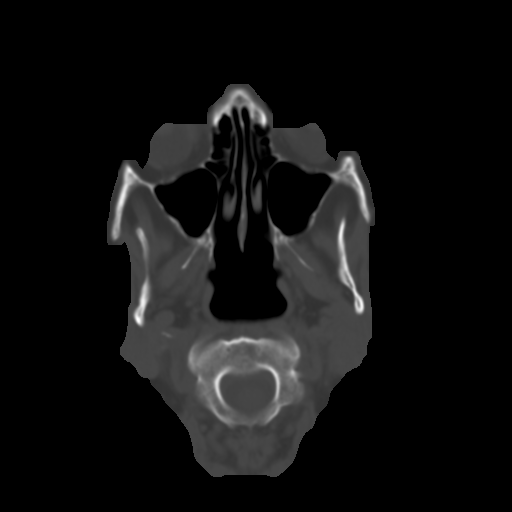
[im 5/34  brain]
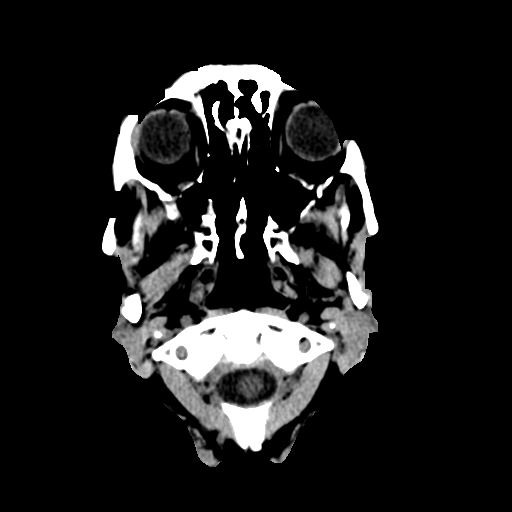
[im 10/34  brain]
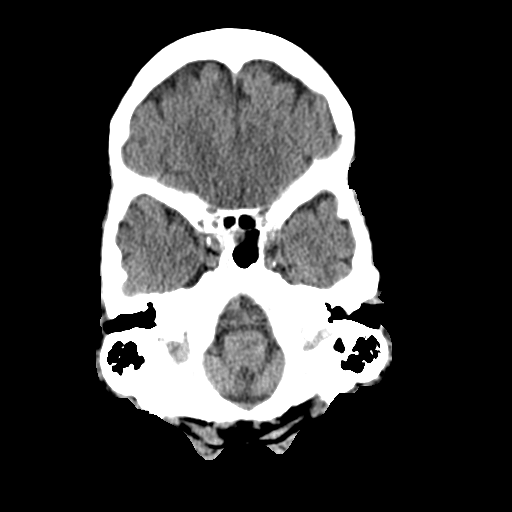
[im 12/34  brain]
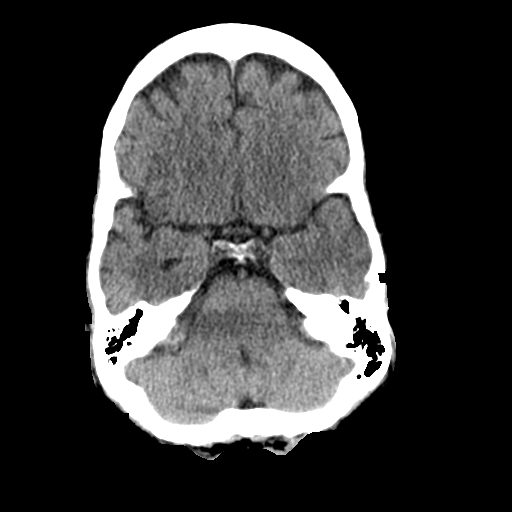
[im 15/34  brain]
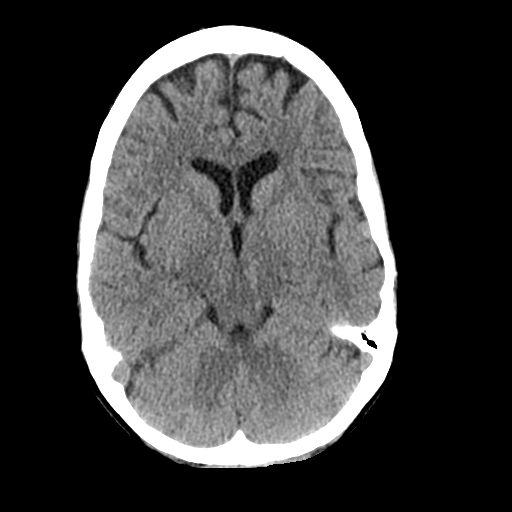
[im 15/34  bone]
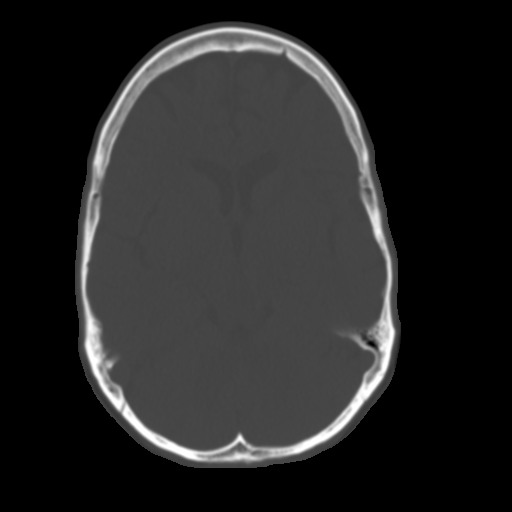
[im 19/34  brain]
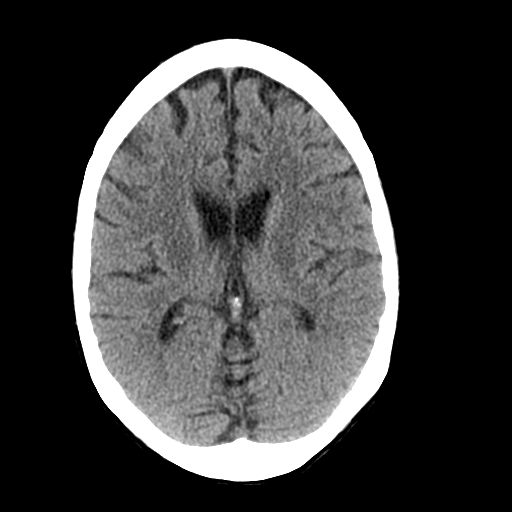
[im 22/34  brain]
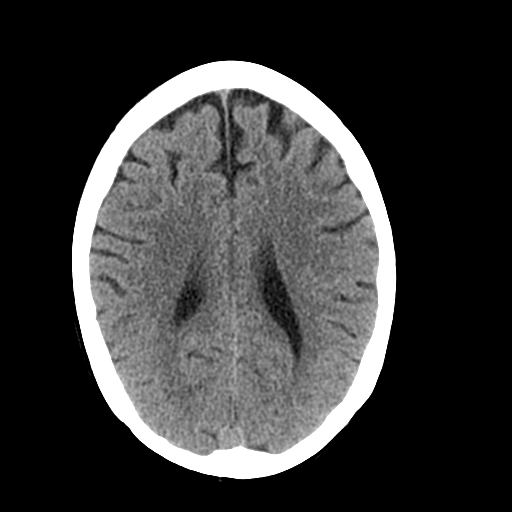
[im 24/34  brain]
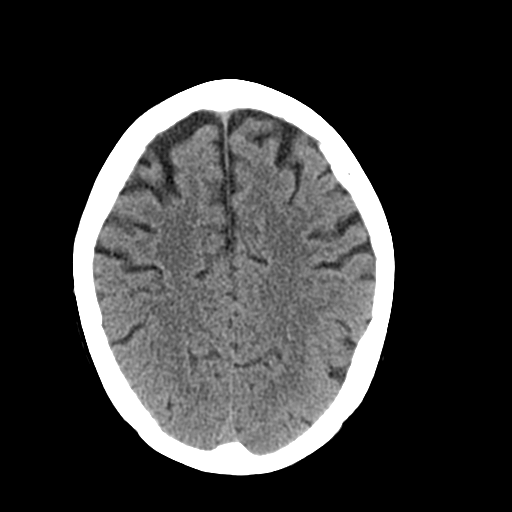
[im 29/34  brain]
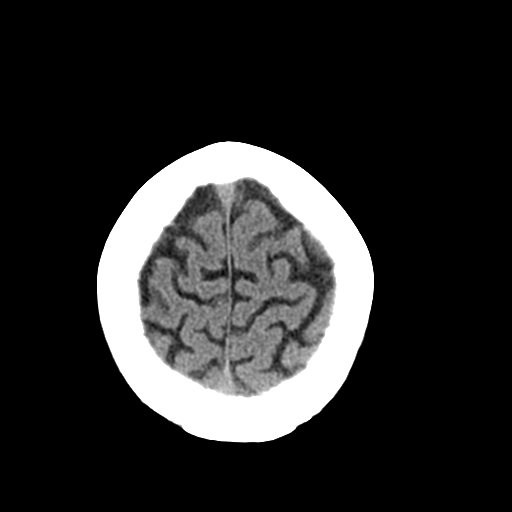
[im 29/34  bone]
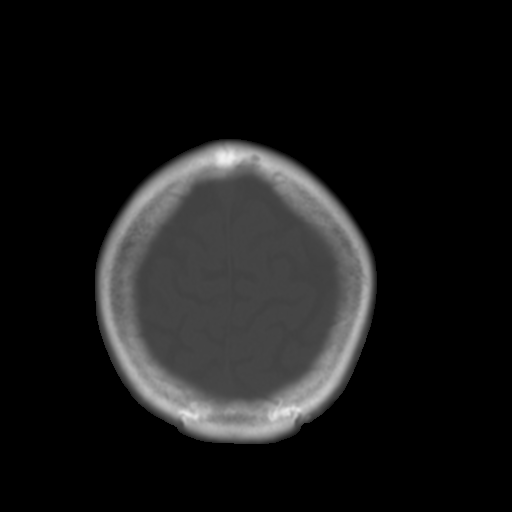
[im 31/34  brain]
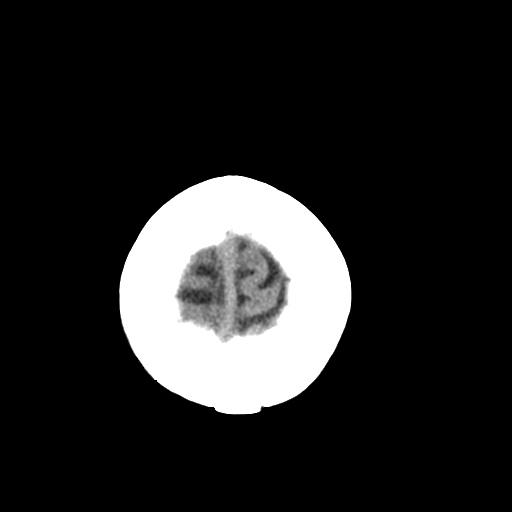

[Series 5: coronal soft tissue · coronal · 0.29mm/px · 3 of 66 slices shown]
[im 22/66  brain]
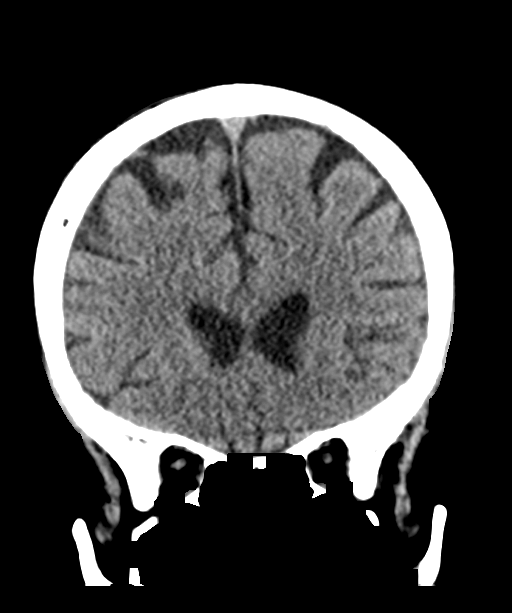
[im 29/66  brain]
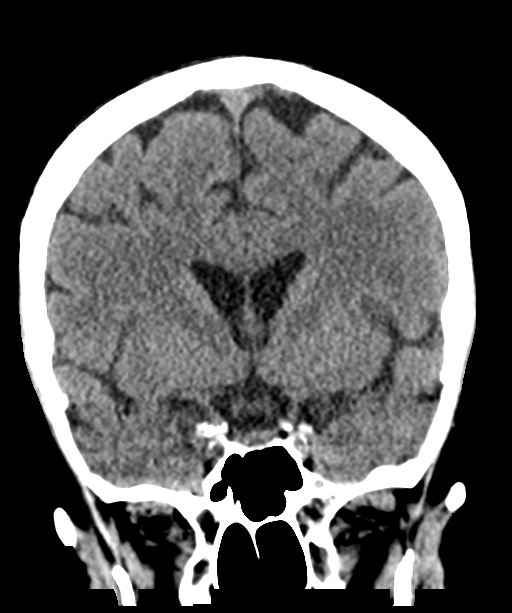
[im 37/66  brain]
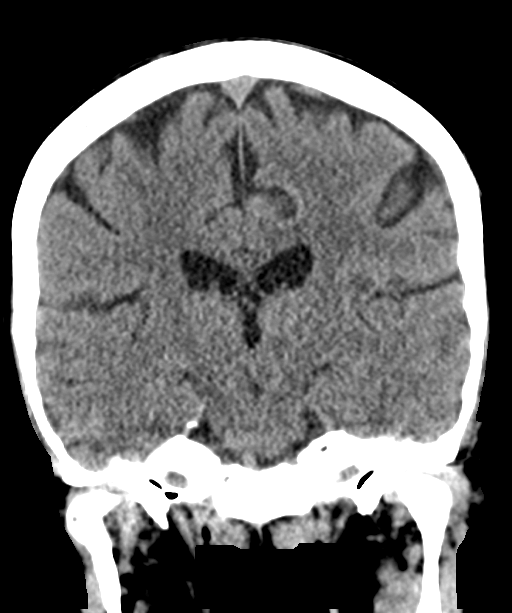

[Series 6: sagittal soft tissue · sagittal · 0.34mm/px · 3 of 48 slices shown]
[im 16/48  brain]
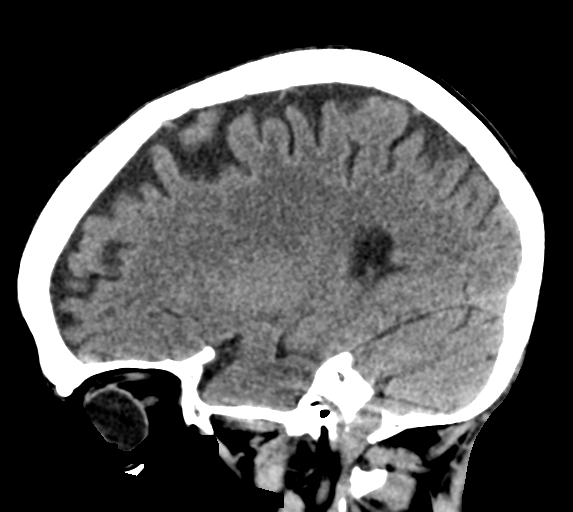
[im 24/48  brain]
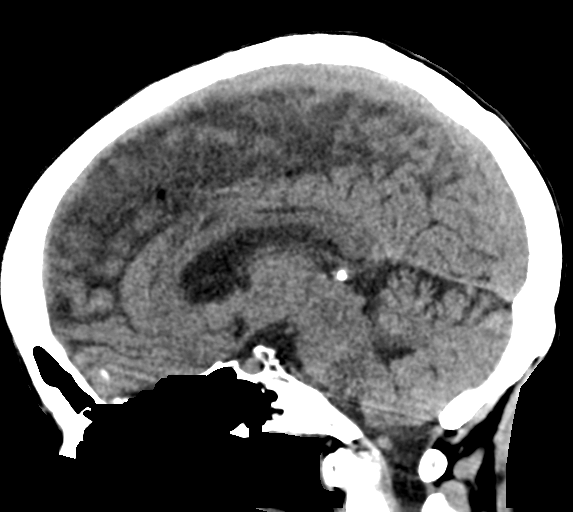
[im 32/48  brain]
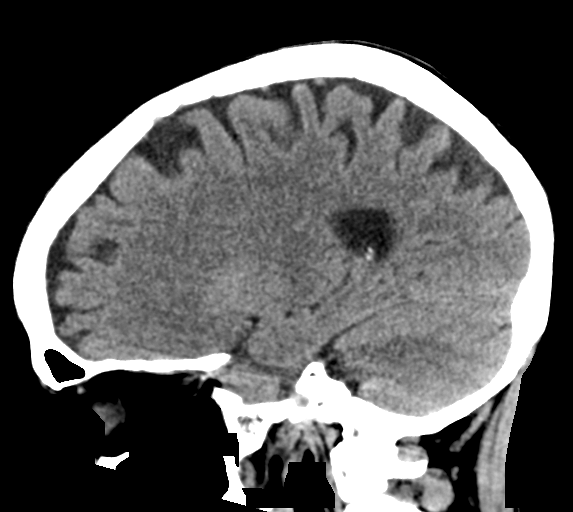

[16 of 47 positions shown; findings below may reference images not displayed]

FINDINGS: Brain: No evidence of parenchymal hemorrhage or extra-axial fluid
collection. No mass lesion, mass effect, or midline shift. No CT
evidence of acute infarction. Cerebral volume is age appropriate. No
ventriculomegaly.

Vascular: No acute abnormality.

Skull: No evidence of calvarial fracture.

Sinuses/Orbits: Minimal patchy opacification of the posterior right
ethmoidal air cells. No fluid levels.

Other:  The mastoid air cells are unopacified.
IMPRESSION: 1. No evidence of acute intracranial abnormality.
2. Minimal paranasal sinusitis, chronic appearing.

## 2021-08-01 MED ORDER — HALOPERIDOL LACTATE 5 MG/ML IJ SOLN
5.0000 mg | Freq: Once | INTRAMUSCULAR | Status: AC
  Administered 2021-08-01: 5 mg via INTRAMUSCULAR
  Filled 2021-08-01: qty 1

## 2021-08-01 MED ORDER — HALOPERIDOL LACTATE 5 MG/ML IJ SOLN: 5.0000 mg | Freq: Once | INTRAMUSCULAR | Status: DC

## 2021-08-01 MED ORDER — LORAZEPAM 1 MG PO TABS: 1.0000 mg | ORAL_TABLET | Freq: Once | ORAL | Status: DC

## 2021-08-01 MED ORDER — LORAZEPAM 2 MG/ML IJ SOLN
1.0000 mg | Freq: Once | INTRAMUSCULAR | Status: AC
  Administered 2021-08-01: 1 mg via INTRAMUSCULAR
  Filled 2021-08-01: qty 1

## 2021-08-01 NOTE — ED Notes (Signed)
1 [employer]  2 [employer]  1 Blue bag  1 Black and white striped shirt  1 [employer]

## 2021-08-01 NOTE — ED Notes (Signed)
PT  PLACED UNDER  IVC PAPERS / PT  MOVED  TO  RM 23
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
## 2021-08-01 NOTE — ED Notes (Signed)
IVC PENDING  CONSULT ?

## 2021-08-01 NOTE — ED Notes (Signed)
Pt transported to CT via stretcher at this time.  

## 2021-08-01 NOTE — ED Provider Notes (Signed)
[facility] Emergency Department Provider Note ____________________________________________   Event Date/Time   First MD Initiated Contact with Patient 08/01/21 1545     (approximate)  I have reviewed the triage vital signs and the nursing notes.  HISTORY  Chief Complaint Altered Mental Status   HPI Carla Martinez is a 60 y.o. femalewho presents to the ED for evaluation of confusion  Chart review indicates history of anxiety depression, anorexia.  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  She followed up with cardiology today in the clinic, and was referred to the ED due to confusion.  Patient presents to the ED from the cardiology office with intermittent confusion.  History is difficult due to her inconsistent nature.  She does report feeling "weird" in a generalized fashion without pain or discrete concerns.  At 1 point, when I am asking her orientation questions (which she is answering correctly), she turns to me and asks me what I said about the chicken salad.  I had not mentioned chicken salad.  Various other interesting inconsistencies throughout our conversation.   She has no complaints, and asks multiple times if she can go outside to smoke a cigarette.   She reports drinking "very little" and denies additional recreational drugs.  Reports that she lives at home with her mother and her son Carla Martinez.  She reports having 6 children, but only 1 lives with her.  History limited due to her confusion  Majority of she was provided by patient's mother at the bedside.  Mother reports that patient has had a difficult time since May when patient's daughter passed unexpectedly. Mother has never seen her like this before, though. Mother reports first noticing something abnormal yesterday evening while they were sitting down watching television together.  She was  erratic and seemed to be talking to herself.  Patient thought that she was with a different family member.  Mother reports that patient had been sneaking NyQuil and drinking this recreationally, a bottle of oxycodone went missing, did find a stash of ethanol in the past.  No other known recreational drug use.  Past Medical History:  Diagnosis Date   Anxiety    Depression     Patient Active Problem List   Diagnosis Date Noted   Moderate protein-calorie malnutrition ([place]) 07/24/2021   Tremor 07/24/2021   Balance problem 07/24/2021   Acute on chronic systolic congestive heart failure ([place]) 07/11/2021   Abnormal CT scan 07/11/2021   Major depressive disorder, recurrent episode with anxious distress ([place]) 07/11/2021   Hx of non-ST elevation myocardial infarction (NSTEMI) 07/11/2021   Adjustment disorder with mixed anxiety and depressed mood 07/05/2021   Alcohol abuse 07/05/2021   Generalized anxiety disorder 07/05/2021   Pressure injury of skin 07/03/2021   Enteritis 07/02/2021   Anorexia 07/02/2021   Alcohol use 07/02/2021   Anxiety 07/02/2021   Insomnia 07/02/2021   Coffee ground emesis 07/02/2021   Atrial fibrillation with RVR ([place]) 53/29/9242   Complicated grief 68/34/1962   Grief at loss of child 07/02/2021   Hypotension, 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   Rectal prolapse 05/09/2021   Diarrhea 05/09/2021   Osteoporosis 02/27/2021   History of bowel resection 02/27/2021   Acid reflux 02/14/2021   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   Acute left-sided low back pain 02/14/2021   Left lower quadrant abdominal pain 02/14/2021   Mild recurrent major depression ([place]) 04/20/2020    Past Surgical History:  Procedure Laterality Date   CHOLECYSTECTOMY     COLON RESECTION     12" removed   LEFT HEART CATH AND CORONARY ANGIOGRAPHY N/A 07/05/2021   Procedure: LEFT HEART CATH AND CORONARY ANGIOGRAPHY;  Surgeon: Andrez Grime, MD;  Location: [facility];  Service:  Cardiovascular;  Laterality: N/A;   MENISCUS REPAIR Bilateral     Prior to Admission medications   Medication Sig Start Date End Date Taking? Authorizing Provider  amitriptyline (ELAVIL) 25 MG tablet Take 1 tablet (25 mg total) by mouth at bedtime. 06/04/21  Yes Lin Landsman, MD  aspirin EC 81 MG EC tablet Take 1 tablet (81 mg total) by mouth daily. Swallow whole. 07/10/21  Yes Bonnielee Haff, MD  busPIRone (BUSPAR) 10 MG tablet Take 1 tablet (10 mg total) by mouth 3 (three) times daily. 07/09/21 09/07/21 Yes Bonnielee Haff, MD  calcium carbonate (OS-CAL - DOSED IN MG OF ELEMENTAL CALCIUM) 1250 (500 Ca) MG tablet Take 1 tablet by mouth as directed.   Yes [provider]  folic acid (FOLVITE) 1 MG tablet Take 1 tablet (1 mg total) by mouth daily. 07/10/21 09/08/21 Yes Bonnielee Haff, MD  lisinopril (ZESTRIL) 2.5 MG tablet Take 1 tablet (2.5 mg total) by mouth daily. 07/10/21 09/08/21 Yes Bonnielee Haff, MD  Magnesium 500 MG TABS Take 1 tablet by mouth daily.   Yes [provider]  metoprolol succinate (TOPROL-XL) 25 MG 24 hr tablet Take 0.5 tablets (12.5 mg total) by mouth daily. 07/10/21 09/08/21 Yes Bonnielee Haff, MD  Multiple Vitamins-Minerals (WOMENS 50+ ADVANCED PO) Take 1 tablet by mouth daily.   Yes [provider]  pantoprazole (PROTONIX) 40 MG tablet Take 1 tablet (40 mg total) by mouth daily. 07/09/21 08/08/21 Yes Bonnielee Haff, MD  thiamine 100 MG tablet Take 1 tablet (100 mg total) by mouth daily. 07/10/21 08/09/21 Yes Bonnielee Haff, MD  traZODone (DESYREL) 100 MG tablet Take 1 tablet (100 mg total) by mouth at bedtime. 07/11/21  Yes [name], MD  vitamin B-12 (CYANOCOBALAMIN) 1000 MCG tablet Take 1,000 mcg by mouth daily.   Yes [provider]  hydrOXYzine (ATARAX/VISTARIL) 50 MG tablet TAKE 2 TABLETS BY MOUTH THREE TIMES DAILY AS NEEDED FOR ANXIETY 07/31/21   [name], MD  loperamide (IMODIUM) 2 MG capsule Take 1 capsule (2 mg  total) by mouth 4 (four) times daily as needed for diarrhea or loose stools (do not administer within 2 hrs of previous dose). 07/09/21   Bonnielee Haff, MD  midodrine (PROAMATINE) 10 MG tablet Take 1 tablet (10 mg total) by mouth 3 (three) times daily with meals. 07/09/21 10/07/21  Bonnielee Haff, MD  oxyCODONE (OXY IR/ROXICODONE) 5 MG immediate release tablet Take 1 tablet (5 mg total) by mouth every 6 (six) hours as needed for moderate pain. 07/09/21   Bonnielee Haff, MD  QUEtiapine (SEROQUEL) 100 MG tablet Take 1 tablet (100 mg total) by mouth 3 (three) times daily. Patient not taking: No sig reported 03/22/21   [name], MD    Allergies Patient has no known allergies.  Family History  Problem Relation Age of Onset   Leukemia Sister    Diabetes type I Child    Heart disease Sister    Hypertension Mother    Heart disease Mother    Stroke Mother    Down syndrome Son    Stroke Son     Social History Social History   Tobacco Use   Smoking status: Every  Day    Packs/day: 0.25    Years: 17.00    Pack years: 4.25    Types: Cigarettes    Start date: 11/25/2003   Smokeless tobacco: Never   Tobacco comments:    used to smoke 1PPD but has decreased. Down to 1-2 cigarettes daily  Vaping Use   Vaping Use: Never used  Substance Use Topics   Alcohol use: Yes    Comment: 3-4 glasses a night   Drug use: Never    Review of Systems  Unable to be accurately assessed due to patient's confusion ____________________________________________   PHYSICAL EXAM:  VITAL SIGNS: Vitals:   08/01/21 1600 08/01/21 1730  BP: 117/69 121/76  Pulse: 72 65  Resp:    Temp:    SpO2: 100% 98%    Constitutional: Alert and oriented to person, year, location and situation.  Seems cogent at times, but otherwise becomes seemingly paranoid and erratic.  Cachectic. Eyes: Conjunctivae are normal. PERRL. EOMI. Head: Atraumatic. Nose: No congestion/rhinnorhea. Mouth/Throat: Mucous membranes are  moist.  Oropharynx non-erythematous. Neck: No stridor. No cervical spine tenderness to palpation. Cardiovascular: Normal rate, regular rhythm. Grossly normal heart sounds.  Good peripheral circulation. Respiratory: Normal respiratory effort.  No retractions. Lungs CTAB. Gastrointestinal: Soft , nondistended, nontender to palpation. No CVA tenderness. Musculoskeletal: No lower extremity tenderness nor edema.  No joint effusions. No signs of acute trauma. Neurologic:  Normal speech and language. No gross focal neurologic deficits are appreciated. No gait instability noted. Skin:  Skin is warm, dry and intact. No rash noted. Psychiatric: Mood and affect are flat. Speech and behavior are unusual  ____________________________________________   LABS (all labs ordered are listed, but only abnormal results are displayed)  Labs Reviewed  COMPREHENSIVE METABOLIC PANEL - Abnormal; Notable for the following components:      Result Value   Sodium 134 (*)    Chloride 94 (*)    Glucose, Bld 110 (*)    Creatinine, Ser 1.01 (*)    Total Protein 6.4 (*)    All other components within normal limits  CBC - Abnormal; Notable for the following components:   RBC 3.62 (*)    Hemoglobin 11.6 (*)    HCT 34.2 (*)    All other components within normal limits  SALICYLATE LEVEL - Abnormal; Notable for the following components:   Salicylate Lvl <5.5 (*)    All other components within normal limits  ACETAMINOPHEN LEVEL - Abnormal; Notable for the following components:   Acetaminophen (Tylenol), Serum <10 (*)    All other components within normal limits  URINE DRUG SCREEN, QUALITATIVE ([facility] ONLY) - Abnormal; Notable for the following components:   Tricyclic, Ur Screen POSITIVE (*)    Cannabinoid 50 Ng, Ur [place] POSITIVE (*)    All other components within normal limits  URINALYSIS, COMPLETE (UACMP) WITH MICROSCOPIC - Abnormal; Notable for the following components:   Color, Urine YELLOW (*)    APPearance CLOUDY  (*)    Bacteria, UA RARE (*)    All other components within normal limits  RESP PANEL BY RT-PCR (FLU A&B, COVID) ARPGX2  AMMONIA  ETHANOL  CBG MONITORING, ED  POC URINE PREG, ED   ____________________________________________  12 Lead EKG  Sinus rhythm, rate of 93 bpm.  Normal axis.  Slightly prolonged QTC at 524 ms.  No STEMI. ____________________________________________  RADIOLOGY  ED MD interpretation:  CXR reviewed by me without evidence of acute cardiopulmonary pathology.  Ct head reviewed by me without evidence of acute  intracranial pathology.  Official radiology report(s): DG Chest 2 View  Result Date: 08/01/2021 CLINICAL DATA:  Confusion EXAM: CHEST - 2 VIEW COMPARISON:  07/02/2021 FINDINGS: Heart size and vascularity normal. Interval improvement in bibasilar airspace disease which may be clearing pneumonia. No significant effusion. IMPRESSION: Improving bibasilar infiltrates. Electronically Signed   By: Franchot Gallo M.D.   On: 08/01/2021 16:54   CT HEAD WO CONTRAST (5MM)  Result Date: 08/01/2021 CLINICAL DATA:  Mental status change, unknown cause, confusion EXAM: CT HEAD WITHOUT CONTRAST TECHNIQUE: Contiguous axial images were obtained from the base of the skull through the vertex without intravenous contrast. COMPARISON:  None. FINDINGS: Brain: No evidence of parenchymal hemorrhage or extra-axial fluid collection. No mass lesion, mass effect, or midline shift. No CT evidence of acute infarction. Cerebral volume is age appropriate. No ventriculomegaly. Vascular: No acute abnormality. Skull: No evidence of calvarial fracture. Sinuses/Orbits: Minimal patchy opacification of the posterior right ethmoidal air cells. No fluid levels. Other:  The mastoid air cells are unopacified. IMPRESSION: 1. No evidence of acute intracranial abnormality. 2. Minimal paranasal sinusitis, chronic appearing. Electronically Signed   By: Ilona Sorrel M.D.   On: 08/01/2021 18:05     ____________________________________________   PROCEDURES and INTERVENTIONS  Procedure(s) performed (including Critical Care):  .1-3 Lead EKG Interpretation Performed by: Vladimir Crofts, MD Authorized by: Vladimir Crofts, MD     Interpretation: normal     ECG rate:  70   ECG rate assessment: normal     Rhythm: sinus rhythm     Ectopy: none     Conduction: normal    Medications  LORazepam (ATIVAN) injection 1 mg (has no administration in time range)  haloperidol lactate (HALDOL) injection 5 mg (5 mg Intramuscular Given 08/01/21 1701)  LORazepam (ATIVAN) injection 1 mg (1 mg Intramuscular Given 08/01/21 1702)    ____________________________________________   MDM / ED COURSE   60 year old woman presents to the ED with unusual behavior, concerning for psychiatric pathology.  She is alert and oriented without distress, but intermittently quite erratic and unusual.  Some tangential thought processes.  No clear suicidality or homicidality, but I suspect psychosis.  Work-up medically is quite benign without evidence of intracranial pathology, serum derangements, intoxication, hyperammonemia or other metabolic pathologies.  Anticipate psychiatric disease.  Due to her continued need for redirection and her trying to leave, we will IVC her to facilitate appropriate care.  Clinical Course as of 08/01/21 1832  Thu Aug 01, 2021  1626 Call from [place], they were unable to get a CT scan of the head due to patient's altered mentation and refusal to participate. [DS]  3664 Reassessed.  Again trying to smoke. [DS]  47 [name], Carla Martinez, is at the bedside and provides some supplemental history that is quite helpful. [DS]  4034 Medical evaluation is quite benign.  Possibly substance abuse related.  Possibly psychiatric in the setting of her daughter passing, but uncertain.  I think she would benefit from psychiatric evaluation.  [DS]  7425 The patient has been placed in  psychiatric observation due to the need to provide a safe environment for the patient while obtaining psychiatric consultation and evaluation, as well as ongoing medical and medication management to treat the patient's condition.  The patient has been placed under full IVC at this time.   [DS]    Clinical Course User Index [DS] Vladimir Crofts, MD    ____________________________________________   FINAL CLINICAL IMPRESSION(S) / ED DIAGNOSES  Final diagnoses:  Altered mental  status, unspecified altered mental status type  Confusion  Abnormal behavior     ED Discharge Orders     None        Zawadi Aplin Tamala Julian   Note:  This document was prepared using Dragon voice recognition software and may include unintentional dictation errors.    Vladimir Crofts, MD 08/01/21 [phone]

## 2021-08-01 NOTE — ED Triage Notes (Addendum)
Arrives from cardiology for evaluation confusion.  Family states that patient was confused last night, but seemed to get better.   Patient is awake and alert.  Oriented but takes several times of asking questions to get an answer for place.

## 2021-08-01 NOTE — ED Provider Notes (Signed)
Emergency Medicine Provider Triage Evaluation Note  Carla Martinez , a 60 y.o. female  was evaluated in triage.  Pt complains of presents with family from cardiology office for concern for confusion. According to family, patient began to exhibit symptoms last night. She reports feeling bloated.   Review of Systems  Positive: Confusion, AMS Negative: CP, SOB  Physical Exam  BP 126/75 (BP Location: Left Arm)   Pulse 84   Temp 97.9 F (36.6 C) (Oral)   Resp 16   Ht 5\' 3"  (1.6 m)   Wt 98.7 kg   SpO2 100%   BMI 38.55 kg/m  Gen:   Awake, no distress  NAD Resp:  Normal effort CTA MSK:   Moves extremities without difficulty  Other:  CVS: RRR  Medical Decision Making  Medically screening exam initiated at 3:19 PM.  Appropriate orders placed.  Latondra Gebhart was informed that the remainder of the evaluation will be completed by another provider, this initial triage assessment does not replace that evaluation, and the importance of remaining in the ED until their evaluation is complete.  Patient presents to the ED for evaluation of AMS.    Melvenia Needles, PA-C 08/01/21 1526    Vladimir Crofts, MD 08/01/21 [phone]

## 2021-08-01 NOTE — ED Notes (Signed)
Pt attempting to leave and having to be redirected several times to room. Medications ordered and administered as ordered by this RN. Pt took medication willingly.

## 2021-08-02 DIAGNOSIS — F4323 Adjustment disorder with mixed anxiety and depressed mood: Secondary | ICD-10-CM

## 2021-08-02 LAB — RESP PANEL BY RT-PCR (FLU A&B, COVID) ARPGX2
Influenza A by PCR: NEGATIVE
Influenza B by PCR: NEGATIVE
SARS Coronavirus 2 by RT PCR: NEGATIVE

## 2021-08-02 MED ORDER — HYDROXYZINE HCL 25 MG PO TABS
50.0000 mg | ORAL_TABLET | Freq: Once | ORAL | Status: AC
  Administered 2021-08-02: 50 mg via ORAL
  Filled 2021-08-02: qty 2

## 2021-08-02 NOTE — ED Notes (Addendum)
This RN attempted to call report to [facility] [phone] and [phone]. RN unsuccessful at this time

## 2021-08-02 NOTE — BH Assessment (Signed)
PATIENT BED AVAILABLE AFTER 9AM for 08/02/21  Patient has been accepted to [facility] assigned to [place]- Unit Accepting physician is Dr. Enzo Bi.  Call report to [phone] or [phone].  Representative was [employer].   ER Staff is aware of it:  [facility] ER Secretary  Dr. Beather Arbour, [place] Patient's Nurse     Patient's Family/Support System (Dorris [phone]) have been updated as well. Mother was provided with the address and phone number of the facility.

## 2021-08-02 NOTE — ED Notes (Addendum)
Pt's family at bedside for visitation prior to pt being transferred. Security present.

## 2021-08-02 NOTE — BH Assessment (Signed)
Referral information for Psychiatric Hospitalization faxed to;   [facility] ([phone]- [phone]),   [facility] [phone]) [phone]  [facility] [phone]),  [facility] [phone]- [phone]),   [facility] [phone])  [place] [phone] or [phone]),

## 2021-08-02 NOTE — Consult Note (Signed)
[facility] Consult   Reason for Consult:Altered Mental Status  Referring Physician: Dr. Tamala Julian Patient Identification: Carla Martinez MRN:  [id number] Principal Diagnosis: <principal problem not specified> Diagnosis:  Active Problems:   Anorexia nervosa in remission   Anorexia   Alcohol use   Anxiety   Insomnia   Complicated grief   Grief at loss of child   Adjustment disorder with mixed anxiety and depressed mood   Alcohol abuse   Generalized anxiety disorder   Total Time spent with patient: 1 hour  Subjective:  Carla Martinez is a 60 y.o. female patient presented to [facility] ED via POV with her mom at her side and voluntary. The patient was placed under involuntary commitment status by the EDP ([facility]). Upon entering the patient's room, she was standing in her room with her pants off and bedding on the floor. The patient was asked to have a seat while her assessment was done. The patient complied. The patient presented to be confused and responding to internal stimuli. The patient was able to say her name and knew where she was.   The patient was seen face-to-face by this provider; the chart was reviewed and consulted with Dr. Tamala Julian on 08/01/2021 due to the patient's care. It was discussed with the EDP that the patient does meet the criteria to be admitted to the psychiatric inpatient unit.  On evaluation, the patient is alert and oriented x2, mildly confused but cooperative, and mood-congruent with affect. The patient's UDS is positive for Cannabinoids and her prescribed Tricyclics. The patient does appear to be responding to internal and external stimuli. The patient is the patient presenting with some delusional thinking. The patient denies auditory or visual hallucinations. The patient denies any suicidal, homicidal, or self-harm ideations. The patient is presenting with some acute psychotic and paranoid behaviors.  Collateral was obtained from the patient's  mother Ms. Carla Martinez [phone] reports that she has never witnessed this type of behavior from her daughter before. Per ER doctor Dr. Tamala Julian upon his examination patient is medically cleared and when asked orientation questions she answered correctly then asked the ER doctor about chicken salad with other inconsistencies throughout their conversation.   HPI: Per Dr. Tamala Julian, Carla Martinez is a 60 y.o. femalewho presents to the ED for evaluation of confusion Chart review indicates history of anxiety depression, anorexia.  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  She followed up with cardiology today in the clinic, and was referred to the ED due to confusion.   Patient presents to the ED from the cardiology office with intermittent confusion.  History is difficult due to her inconsistent nature.  She does report feeling "weird" in a generalized fashion without pain or discrete concerns.   At 1 point, when I am asking her orientation questions (which she is answering correctly), she turns to me and asks me what I said about the chicken salad.  I had not mentioned chicken salad.  Various other interesting inconsistencies throughout our conversation.    She has no complaints, and asks multiple times if she can go outside to smoke a cigarette.    She reports drinking "very little" and denies additional recreational drugs.  Reports that she lives at home with her mother and her son Carla Martinez.  She reports having 6 children, but only 1 lives with her.   History limited due to her confusion   Majority of she  was provided by patient's mother at the bedside.  Mother reports that patient has had a difficult time since May when patient's daughter passed unexpectedly. Mother has never seen her like this before, though. Mother reports first noticing something abnormal yesterday evening while they were  sitting down watching television together.  She was erratic and seemed to be talking to herself.  Patient thought that she was with a different family member.  Mother reports that patient had been sneaking NyQuil and drinking this recreationally, a bottle of oxycodone went missing, did find a stash of ethanol in the past.  No other known recreational drug use.  Past Psychiatric History:  Anxiety   Depression  Risk to Self:   Risk to Others:   Prior Inpatient Therapy:   Prior Outpatient Therapy:    Past Medical History:  Past Medical History:  Diagnosis Date   Anxiety    Depression     Past Surgical History:  Procedure Laterality Date   CHOLECYSTECTOMY     COLON RESECTION     12" removed   LEFT HEART CATH AND CORONARY ANGIOGRAPHY N/A 07/05/2021   Procedure: LEFT HEART CATH AND CORONARY ANGIOGRAPHY;  Surgeon: Andrez Grime, MD;  Location: [facility];  Service: Cardiovascular;  Laterality: N/A;   MENISCUS REPAIR Bilateral    Family History:  Family History  Problem Relation Age of Onset   Leukemia Sister    Diabetes type I Child    Heart disease Sister    Hypertension Mother    Heart disease Mother    Stroke Mother    Down syndrome Son    Stroke Son    Family Psychiatric  History:  Social History:  Social History   Substance and Sexual Activity  Alcohol Use Yes   Comment: 3-4 glasses a night     Social History   Substance and Sexual Activity  Drug Use Never    Social History   Socioeconomic History   Marital status: Single    Spouse name: Not on file   Number of children: 6   Years of education: some college   Highest education level: Not on file  Occupational History   Not on file  Tobacco Use   Smoking status: Every Day    Packs/day: 0.25    Years: 17.00    Pack years: 4.25    Types: Cigarettes    Start date: 11/25/2003   Smokeless tobacco: Never   Tobacco comments:    used to smoke 1PPD but has decreased. Down to 1-2 cigarettes daily   Vaping Use   Vaping Use: Never used  Substance and Sexual Activity   Alcohol use: Yes    Comment: 3-4 glasses a night   Drug use: Never   Sexual activity: Not Currently  Other Topics Concern   Not on file  Social History Narrative   02/14/21   From: moved to [facility] 2020 to be near family   Living: with mom and son who is dependent adult son with Down syndrome   Work: care giver      Family: 6 children - Carla Martinez lives with her, Surveyor, minerals (2002), - 4 living grandchildren      Enjoys: Training and development officer, Medical laboratory scientific officer      Exercise: use to exercise non-stop   Diet: good, today ate 2 hard boiled eggs, 1 meal and few snacks      Safety   Seat belts: Yes    Guns: No   Safe in relationships: Yes  Social Determinants of Health   Financial Resource Strain: Not on file  Food Insecurity: Not on file  Transportation Needs: Not on file  Physical Activity: Not on file  Stress: Not on file  Social Connections: Not on file   Additional Social History:    Allergies:  No Known Allergies  Labs:  Results for orders placed or performed during the hospital encounter of 08/01/21 (from the past 48 hour(s))  Comprehensive metabolic panel     Status: Abnormal   Collection Time: 08/01/21 10:54 AM  Result Value Ref Range   Sodium 134 (L) 135 - 145 mmol/L   Potassium 3.7 3.5 - 5.1 mmol/L   Chloride 94 (L) 98 - 111 mmol/L   CO2 30 22 - 32 mmol/L   Glucose, Bld 110 (H) 70 - 99 mg/dL    Comment: Glucose reference range applies only to samples taken after fasting for at least 8 hours.   BUN 15 6 - 20 mg/dL   Creatinine, Ser 1.01 (H) 0.44 - 1.00 mg/dL   Calcium 9.8 8.9 - 10.3 mg/dL   Total Protein 6.4 (L) 6.5 - 8.1 g/dL   Albumin 3.9 3.5 - 5.0 g/dL   AST 18 15 - 41 U/L   ALT 14 0 - 44 U/L   Alkaline Phosphatase 58 38 - 126 U/L   Total Bilirubin 0.4 0.3 - 1.2 mg/dL   GFR, Estimated >60 >60 mL/min    Comment: (NOTE) Calculated using the CKD-EPI Creatinine Equation (2021)    Anion gap 10 5 - 15    Comment:  Performed at [facility], [address] [place], [place] [address]  CBC     Status: Abnormal   Collection Time: 08/01/21 10:54 AM  Result Value Ref Range   WBC 8.1 4.0 - 10.5 K/uL   RBC 3.62 (L) 3.87 - 5.11 MIL/uL   Hemoglobin 11.6 (L) 12.0 - 15.0 g/dL   HCT 34.2 (L) 36.0 - 46.0 %   MCV 94.5 80.0 - 100.0 fL   MCH 32.0 26.0 - 34.0 pg   MCHC 33.9 30.0 - 36.0 g/dL   RDW 12.7 11.5 - 15.5 %   Platelets 255 150 - 400 K/uL   nRBC 0.0 0.0 - 0.2 %    Comment: Performed at [facility], [address] [place], [place] [address]  Salicylate level     Status: Abnormal   Collection Time: 08/01/21  4:06 PM  Result Value Ref Range   Salicylate Lvl <6.7 (L) 7.0 - 30.0 mg/dL    Comment: Performed at [facility], [address], [place] [address]  Acetaminophen level     Status: Abnormal   Collection Time: 08/01/21  4:06 PM  Result Value Ref Range   Acetaminophen (Tylenol), Serum <10 (L) 10 - 30 ug/mL    Comment: (NOTE) Therapeutic concentrations vary significantly. A range of 10-30 ug/mL  may be an effective concentration for many patients. However, some  are best treated at concentrations outside of this range. Acetaminophen concentrations >150 ug/mL at 4 hours after ingestion  and >50 ug/mL at 12 hours after ingestion are often associated with  toxic reactions.  Performed at [facility], [address] [place], [place] [address]   Ammonia     Status: None   Collection Time: 08/01/21  4:06 PM  Result Value Ref Range   Ammonia <10 9 - 35 umol/L    Comment: Performed at [facility], [address], [place], [place]  [address]  Urine Drug Screen, Qualitative ([facility] only)     Status: Abnormal   Collection Time: 08/01/21  4:06 PM  Result Value Ref Range   Tricyclic, Ur Screen POSITIVE (A) NONE DETECTED   Amphetamines, Ur Screen NONE DETECTED NONE DETECTED   MDMA (Ecstasy)Ur Screen NONE DETECTED NONE DETECTED   Cocaine  Metabolite,Ur [place] NONE DETECTED NONE DETECTED   Opiate, Ur Screen NONE DETECTED NONE DETECTED   Phencyclidine (PCP) Ur S NONE DETECTED NONE DETECTED   Cannabinoid 50 Ng, Ur [place] POSITIVE (A) NONE DETECTED   Barbiturates, Ur Screen NONE DETECTED NONE DETECTED   Benzodiazepine, Ur Scrn NONE DETECTED NONE DETECTED   Methadone Scn, Ur NONE DETECTED NONE DETECTED    Comment: (NOTE) Tricyclics + metabolites, urine    Cutoff 1000 ng/mL Amphetamines + metabolites, urine  Cutoff 1000 ng/mL MDMA (Ecstasy), urine              Cutoff 500 ng/mL Cocaine Metabolite, urine          Cutoff 300 ng/mL Opiate + metabolites, urine        Cutoff 300 ng/mL Phencyclidine (PCP), urine         Cutoff 25 ng/mL Cannabinoid, urine                 Cutoff 50 ng/mL Barbiturates + metabolites, urine  Cutoff 200 ng/mL Benzodiazepine, urine              Cutoff 200 ng/mL Methadone, urine                   Cutoff 300 ng/mL  The urine drug screen provides only a preliminary, unconfirmed analytical test result and should not be used for non-medical purposes. Clinical consideration and professional judgment should be applied to any positive drug screen result due to possible interfering substances. A more specific alternate chemical method must be used in order to obtain a confirmed analytical result. Gas chromatography / mass spectrometry (GC/MS) is the preferred confirm atory method. Performed at [facility], [address] [place], [place] [address]   Urinalysis, Complete w Microscopic Urine, Clean Catch     Status: Abnormal   Collection Time: 08/01/21  4:06 PM  Result Value Ref Range   Color, Urine YELLOW (A) YELLOW   APPearance CLOUDY (A) CLEAR   Specific Gravity, Urine 1.006 1.005 - 1.030   pH 7.0 5.0 - 8.0   Glucose, UA NEGATIVE NEGATIVE mg/dL   Hgb urine dipstick NEGATIVE NEGATIVE   Bilirubin Urine NEGATIVE NEGATIVE   Ketones, ur NEGATIVE NEGATIVE mg/dL   Protein, ur NEGATIVE NEGATIVE mg/dL    Nitrite NEGATIVE NEGATIVE   Leukocytes,Ua NEGATIVE NEGATIVE   RBC / HPF 0-5 0 - 5 RBC/hpf   WBC, UA 6-10 0 - 5 WBC/hpf   Bacteria, UA RARE (A) NONE SEEN   Squamous Epithelial / LPF 0-5 0 - 5   Mucus PRESENT     Comment: Performed at [facility], [address] [place], [place] [address]  Ethanol     Status: None   Collection Time: 08/01/21  4:06 PM  Result Value Ref Range   Alcohol, Ethyl (B) <10 <10 mg/dL    Comment: (NOTE) Lowest detectable limit for serum alcohol is 10 mg/dL.  For medical purposes only. Performed at [facility], [address] [place], [place] [address]     No current facility-administered medications for this encounter.   Current Outpatient Medications  Medication Sig Dispense Refill  amitriptyline (ELAVIL) 25 MG tablet Take 1 tablet (25 mg total) by mouth at bedtime. 30 tablet 2   aspirin EC 81 MG EC tablet Take 1 tablet (81 mg total) by mouth daily. Swallow whole. 30 tablet 11   busPIRone (BUSPAR) 10 MG tablet Take 1 tablet (10 mg total) by mouth 3 (three) times daily. 90 tablet 1   calcium carbonate (OS-CAL - DOSED IN MG OF ELEMENTAL CALCIUM) 1250 (500 Ca) MG tablet Take 1 tablet by mouth as directed.     folic acid (FOLVITE) 1 MG tablet Take 1 tablet (1 mg total) by mouth daily. 30 tablet 1   lisinopril (ZESTRIL) 2.5 MG tablet Take 1 tablet (2.5 mg total) by mouth daily. 30 tablet 1   Magnesium 500 MG TABS Take 1 tablet by mouth daily.     metoprolol succinate (TOPROL-XL) 25 MG 24 hr tablet Take 0.5 tablets (12.5 mg total) by mouth daily. 15 tablet 1   Multiple Vitamins-Minerals (WOMENS 50+ ADVANCED PO) Take 1 tablet by mouth daily.     pantoprazole (PROTONIX) 40 MG tablet Take 1 tablet (40 mg total) by mouth daily. 30 tablet 0   thiamine 100 MG tablet Take 1 tablet (100 mg total) by mouth daily. 30 tablet 0   traZODone (DESYREL) 100 MG tablet Take 1 tablet (100 mg total) by mouth at bedtime. 90 tablet 0   vitamin B-12  (CYANOCOBALAMIN) 1000 MCG tablet Take 1,000 mcg by mouth daily.     hydrOXYzine (ATARAX/VISTARIL) 50 MG tablet TAKE 2 TABLETS BY MOUTH THREE TIMES DAILY AS NEEDED FOR ANXIETY 180 tablet 0   loperamide (IMODIUM) 2 MG capsule Take 1 capsule (2 mg total) by mouth 4 (four) times daily as needed for diarrhea or loose stools (do not administer within 2 hrs of previous dose). 30 capsule 0   midodrine (PROAMATINE) 10 MG tablet Take 1 tablet (10 mg total) by mouth 3 (three) times daily with meals. 90 tablet 2   oxyCODONE (OXY IR/ROXICODONE) 5 MG immediate release tablet Take 1 tablet (5 mg total) by mouth every 6 (six) hours as needed for moderate pain. 25 tablet 0   QUEtiapine (SEROQUEL) 100 MG tablet Take 1 tablet (100 mg total) by mouth 3 (three) times daily. (Patient not taking: No sig reported) 270 tablet 1    Musculoskeletal: Strength & Muscle Tone: within normal limits Gait & Station: normal Patient leans: N/A  Psychiatric Specialty Exam:  Presentation  General Appearance: Bizarre; Disheveled  Eye Contact:Poor  Speech:Garbled; Blocked  Speech Volume:Decreased  Handedness:Right   Mood and Affect  Mood:Dysphoric  Affect:Inappropriate   Thought Process  Thought Processes:Disorganized; Goal Directed  Descriptions of Associations:Loose  Orientation:Partial  Thought Content:Delusions; Scattered  History of Schizophrenia/Schizoaffective disorder:No  Duration of Psychotic Symptoms:Less than six months  Hallucinations:Hallucinations: Auditory; Visual Description of Auditory Hallucinations: Unknown Description of Visual Hallucinations: Unknown  Ideas of Reference:Delusions; Paranoia  Suicidal Thoughts:Suicidal Thoughts: No  Homicidal Thoughts:Homicidal Thoughts: No   Sensorium  Memory:Immediate Poor; Recent Poor; Remote Poor  Judgment:Poor  Insight:Poor   Executive Functions  Concentration:Poor  Attention Span:Poor  Recall:Poor  Fund of  Knowledge:Poor  Language:Poor   Psychomotor Activity  Psychomotor Activity:Psychomotor Activity: Normal   Assets  Assets:Communication Skills; Desire for Improvement; Intimacy; Physical Health; Resilience; Social Support   Sleep  Sleep:Sleep: Poor   Physical Exam: Physical Exam Vitals and nursing note reviewed.  Constitutional:      Appearance: She is ill-appearing.  HENT:     Head: Normocephalic and atraumatic.  Nose: Nose normal.     Mouth/Throat:     Mouth: Mucous membranes are dry.  Cardiovascular:     Rate and Rhythm: Normal rate.     Pulses: Normal pulses.  Pulmonary:     Effort: Pulmonary effort is normal.  Musculoskeletal:        General: Normal range of motion.     Cervical back: Normal range of motion and neck supple.  Skin:    General: Skin is dry.  Neurological:     Mental Status: She is alert. She is disoriented.  Psychiatric:        Attention and Perception: She is inattentive.        Mood and Affect: Mood is depressed. Affect is blunt.        Speech: Speech is delayed and slurred.        Behavior: Behavior is agitated and withdrawn.        Thought Content: Thought content is delusional.        Cognition and Memory: Cognition is impaired. She exhibits impaired recent memory.        Judgment: Judgment is impulsive and inappropriate.   Review of Systems  Psychiatric/Behavioral:  Positive for depression, hallucinations and substance abuse. The patient is nervous/anxious and has insomnia.   All other systems reviewed and are negative. Blood pressure 121/76, pulse 65, temperature 97.9 F (36.6 C), temperature source Oral, resp. rate 18, height 5\' 3"  (1.6 m), weight 39.9 kg, SpO2 98 %. Body mass index is 15.59 kg/m.  Treatment Plan Summary: Medication management and Plan Patient does meet criteria for psychiatric inpatient admission  Disposition: Recommend psychiatric Inpatient admission when medically cleared. Supportive therapy provided about  ongoing stressors.  Caroline Sauger, NP 08/02/2021 12:05 AM

## 2021-08-02 NOTE — ED Notes (Signed)
Received call from [place] at [facility]. Information provided to determine if pt meets criteria for admission. Carla Martinez stated that he would contact [place] and Education officer, museum regarding acceptance.

## 2021-08-02 NOTE — ED Notes (Signed)
EMTALA reviewed by this RN.  

## 2021-08-02 NOTE — ED Notes (Signed)
Called [facility] for [facility] transport to [place]

## 2021-08-02 NOTE — ED Notes (Signed)
Pt's mother took pt belongings listed in prior triage note

## 2021-08-02 NOTE — ED Notes (Signed)
Pt lower dentures in sink. Lower dentures placed in biohazard bag, labeled and place at nurses' station.

## 2021-08-02 NOTE — ED Provider Notes (Signed)
Emergency Medicine Observation Re-evaluation Note  Beatris Belen is a 60 y.o. female, seen on rounds today.  Pt initially presented to the ED for complaints of Altered Mental Status Currently, the patient is resting, voices no medical complaints.  Physical Exam  BP 121/76   Pulse 65   Temp 97.9 F (36.6 C) (Oral)   Resp 18   Ht 5\' 3"  (1.6 m)   Wt 39.9 kg   SpO2 98%   BMI 15.59 kg/m  Physical Exam General: Resting in no acute distress Cardiac: No cyanosis Lungs: Equal rise and fall Psych: Not agitated  ED Course / MDM  EKG:EKG Interpretation  Date/Time:  Thursday August 01 2021 10:58:45 EDT Ventricular Rate:  93 PR Interval:  150 QRS Duration: 84 QT Interval:  422 QTC Calculation: 524 R Axis:   40 Text Interpretation: Normal sinus rhythm Nonspecific T wave abnormality Prolonged QT Abnormal ECG Confirmed by UNCONFIRMED, DOCTOR ([id number]), editor Mel Almond, Tammy [phone]) on 08/01/2021 1:56:33 PM  I have reviewed the labs performed to date as well as medications administered while in observation.  Recent changes in the last 24 hours include no events overnight.  Plan  Current plan is for transfer to [facility] later this morning. Ashrita Chrismer is under involuntary commitment.      Paulette Blanch, MD 08/02/21 [phone]

## 2021-08-02 NOTE — ED Notes (Signed)
Pt ambulated to restroom with minimal assistance, returned to room, currently eating breakfast.

## 2021-08-02 NOTE — ED Notes (Signed)
Pt sitting on floor, nude and holding scrub top against chest. Pt assisted with putting on scrub top and bottom. Pt states that she feels "really bogged down". Pt c/o L lower back pain, which she rates 3/10 on 0-10 pain scale. Pt denies SI/HI/AVH at this time. Pt states that she has trouble falling asleep and staying asleep. She describes her appetite as "fair". Pt denies depression and anxiety at this time. Unable to understand pt at times during assessment due to pt mumbling. No acute distress noted.

## 2021-08-02 NOTE — ED Notes (Addendum)
This RN called pt's family to inform that pt's bottom dentures were left. Mother stating she noticed pt's white hoodie was not in her belongings bag that she took home prior to pt being transferred. This RN searched for missing hoodie but was unable to locate it. No prior RN note of what belongings were taken off pt.

## 2021-08-07 ENCOUNTER — Ambulatory Visit: Payer: No Typology Code available for payment source | Admitting: Family Medicine

## 2021-08-14 ENCOUNTER — Telehealth: Payer: Self-pay | Admitting: Family Medicine

## 2021-08-14 NOTE — Telephone Encounter (Signed)
Dr. Einar Pheasant will discuss this with pt at her appt tomorrow.

## 2021-08-14 NOTE — Telephone Encounter (Signed)
Pt called in stated she would like Dr. Einar Pheasant to see her son . Advise her Dr. Einar Pheasant was not taking new pt at this time.

## 2021-08-15 ENCOUNTER — Other Ambulatory Visit: Payer: Self-pay

## 2021-08-15 ENCOUNTER — Ambulatory Visit (INDEPENDENT_AMBULATORY_CARE_PROVIDER_SITE_OTHER): Payer: No Typology Code available for payment source | Admitting: Family Medicine

## 2021-08-15 VITALS — BP 100/70 | HR 70 | Temp 96.1°F | Ht 63.0 in | Wt 89.1 lb

## 2021-08-15 DIAGNOSIS — I5023 Acute on chronic systolic (congestive) heart failure: Secondary | ICD-10-CM | POA: Diagnosis not present

## 2021-08-15 DIAGNOSIS — F339 Major depressive disorder, recurrent, unspecified: Secondary | ICD-10-CM | POA: Diagnosis not present

## 2021-08-15 DIAGNOSIS — R140 Abdominal distension (gaseous): Secondary | ICD-10-CM

## 2021-08-15 DIAGNOSIS — R9389 Abnormal findings on diagnostic imaging of other specified body structures: Secondary | ICD-10-CM

## 2021-08-15 DIAGNOSIS — I480 Paroxysmal atrial fibrillation: Secondary | ICD-10-CM | POA: Diagnosis not present

## 2021-08-15 DIAGNOSIS — Z23 Encounter for immunization: Secondary | ICD-10-CM

## 2021-08-15 DIAGNOSIS — E440 Moderate protein-calorie malnutrition: Secondary | ICD-10-CM | POA: Diagnosis not present

## 2021-08-15 NOTE — Telephone Encounter (Signed)
Pt was told today, ok for son to schedule in the winter/spring

## 2021-08-15 NOTE — Assessment & Plan Note (Signed)
Weight is up some today and reports doing regular protein replacement. Still difficulty with po intake 2/2 to GI symptoms

## 2021-08-15 NOTE — Assessment & Plan Note (Signed)
She needs f/u MRI but also requires sedation 2/2 to severe anxiety. Will discuss with cardiology for safety of anesthesia given new Hf diagnosis.

## 2021-08-15 NOTE — Patient Instructions (Addendum)
09/05/2019 - Psychiatry at 9 am - Gastroenterology 2:30 pm - [facility] in [place] (Bloating concern)    Call [facility] for follow-up visit for heart [facility]   [place], [place] [address]   [phone]    I will also call their office to try and touch base about the imaging (Spine imaging)    With me in 4 months   Allie Bossier for virtual in 6 weeks

## 2021-08-15 NOTE — Assessment & Plan Note (Signed)
Rate controlled. Cont metoprolol 12.5 mg daily. Appreciate cardiology support - per their note planning possible holter to evaluate further.

## 2021-08-15 NOTE — Assessment & Plan Note (Signed)
Appears euvolemic today. Cont metoprolol

## 2021-08-15 NOTE — Progress Notes (Signed)
Subjective:     Carla Martinez is a 60 y.o. female presenting for Follow-up     HPI  #Insurance paperwork  - just needs a list of the providers she is seeing  #Intake - is eating small amount - doing protein shakes 3 times a day - getting 300 calories with each meal  #Bloating - last bm was this morning - is constipation   #Cardiology/heart failure - has only been taking metoprolol  - has not been taking lisinopril  - bp has been running SBP 100s  #spine imaging/needs - wondering when she can taking   #psych f/u - 11/16 - therapy scheduled in December  Review of Systems   Social History   Tobacco Use  Smoking Status Every Day   Packs/day: 0.25   Years: 17.00   Pack years: 4.25   Types: Cigarettes   Start date: 11/25/2003  Smokeless Tobacco Never  Tobacco Comments   used to smoke 1PPD but has decreased. Down to 1-2 cigarettes daily        Objective:    BP Readings from Last 3 Encounters:  08/15/21 100/70  08/02/21 106/67  07/24/21 100/62   Wt Readings from Last 3 Encounters:  08/15/21 89 lb 2 oz (40.4 kg)  08/01/21 88 lb (39.9 kg)  07/24/21 85 lb 6 oz (38.7 kg)    BP 100/70   Pulse 70   Temp (!) 96.1 F (35.6 C) (Temporal)   Ht 5\' 3"  (1.6 m)   Wt 89 lb 2 oz (40.4 kg)   SpO2 96%   BMI 15.79 kg/m    Physical Exam Constitutional:      General: She is not in acute distress.    Appearance: She is well-developed. She is not diaphoretic.  HENT:     Right Ear: External ear normal.     Left Ear: External ear normal.     Nose: Nose normal.  Eyes:     Conjunctiva/sclera: Conjunctivae normal.  Cardiovascular:     Rate and Rhythm: Normal rate and regular rhythm.     Heart sounds: No murmur heard. Pulmonary:     Effort: Pulmonary effort is normal. No respiratory distress.     Breath sounds: Normal breath sounds. No wheezing or rales.  Musculoskeletal:     Cervical back: Neck supple.     Right lower leg: No edema.     Left lower  leg: No edema.  Skin:    General: Skin is warm and dry.     Capillary Refill: Capillary refill takes less than 2 seconds.  Neurological:     Mental Status: She is alert. Mental status is at baseline.  Psychiatric:        Mood and Affect: Mood normal.        Behavior: Behavior normal.          Assessment & Plan:   Problem List Items Addressed This Visit       Cardiovascular and Mediastinum   Atrial fibrillation ([place])    Rate controlled. Cont metoprolol 12.5 mg daily. Appreciate cardiology support - per their note planning possible holter to evaluate further.       Acute on chronic systolic congestive heart failure ([place]) - Primary    Appears euvolemic today. Cont metoprolol        Other   Bloating    Reviewed date and time for upcoming GI appointment w/ patient. Appreciate GI support for stomach concerns      Abnormal CT scan  She needs f/u MRI but also requires sedation 2/2 to severe anxiety. Will discuss with cardiology for safety of anesthesia given new Hf diagnosis.       Major depressive disorder, recurrent episode with anxious distress [facility])    Recent psych admission but records not available today. Will need to request. She will try to bring new medications to the office to update chart. She appears in a much better mental state and has psych follow-up in ~2 weeks. Continue medications from discharge and bring them so we can check      Relevant Medications   traZODone (DESYREL) 150 MG tablet   Moderate protein-calorie malnutrition (HCC)    Weight is up some today and reports doing regular protein replacement. Still difficulty with po intake 2/2 to GI symptoms      Other Visit Diagnoses     Need for influenza vaccination       Relevant Orders   Flu Vaccine QUAD 68mo+IM (Fluarix, Fluzone & Alfiuria Quad PF) (Completed)        Return in about 6 weeks (around 09/26/2021) for for virtual visit with Allie Bossier.  [name], MD  This visit occurred  during the SARS-CoV-2 public health emergency.  Safety protocols were in place, including screening questions prior to the visit, additional usage of staff PPE, and extensive cleaning of exam room while observing appropriate contact time as indicated for disinfecting solutions.

## 2021-08-15 NOTE — Assessment & Plan Note (Signed)
Recent psych admission but records not available today. Will need to request. She will try to bring new medications to the office to update chart. She appears in a much better mental state and has psych follow-up in ~2 weeks. Continue medications from discharge and bring them so we can check

## 2021-08-15 NOTE — Assessment & Plan Note (Signed)
Reviewed date and time for upcoming GI appointment w/ patient. Appreciate GI support for stomach concerns

## 2021-08-19 ENCOUNTER — Telehealth: Payer: Self-pay | Admitting: Family Medicine

## 2021-08-19 NOTE — Telephone Encounter (Signed)
Carla Martinez with [facility] Cardiologist called stating that Dr Corky Sox states in regard to sudation pt has a low injection fraction so they should minimize sudation and blood pressure shift.

## 2021-08-19 NOTE — Telephone Encounter (Signed)
Noted will discuss options with anesthesia for coordinating MRI

## 2021-08-20 ENCOUNTER — Telehealth (HOSPITAL_COMMUNITY): Payer: Self-pay | Admitting: Vascular Surgery

## 2021-08-20 NOTE — Progress Notes (Signed)
Contacted by PCP [name], MD because patient is being considered for MRI under anesthesia. She has reached out to cardiology as well. Communicated that patient will likely require general anesthesia. Would want to make sure HF symptoms controlled and no active withdrawal symptoms given her history. Discharge summary from 07/02/21-07/09/21 and last [facility] records reviewed and discussed with anesthesiologist Dr. Kalman Shan. A more detailed note summarizing history and tests may follow depending on if and when MRI under anesthesia is scheduled.  Myra Gianotti, PA-C Surgical Short Stay/Anesthesiology [facility] Phone [phone] 08/20/2021 2:41 PM

## 2021-08-23 ENCOUNTER — Telehealth: Payer: Self-pay | Admitting: Family Medicine

## 2021-08-23 NOTE — Telephone Encounter (Signed)
Pt called stating that she needs a antibiotic for a head call. I told pt should would need appt but she said she was seen 08/15/21. Pt also says that her mother needs an antibiotic for the same thing which is also a pt of cody Barbee [place] 12/30/1933.I told she would need appt but she wanted me to ask.

## 2021-08-23 NOTE — Telephone Encounter (Signed)
LMTCB to schedule an appt.

## 2021-08-26 NOTE — Telephone Encounter (Signed)
CALLED PATIENT AND LEFT VM

## 2021-08-28 DIAGNOSIS — I509 Heart failure, unspecified: Secondary | ICD-10-CM

## 2021-08-28 HISTORY — DX: Heart failure, unspecified: I50.9

## 2021-09-03 ENCOUNTER — Other Ambulatory Visit: Payer: Self-pay | Admitting: Family Medicine

## 2021-09-03 DIAGNOSIS — F3340 Major depressive disorder, recurrent, in remission, unspecified: Secondary | ICD-10-CM

## 2021-09-04 ENCOUNTER — Ambulatory Visit (INDEPENDENT_AMBULATORY_CARE_PROVIDER_SITE_OTHER): Payer: No Typology Code available for payment source | Admitting: Psychiatry

## 2021-09-04 ENCOUNTER — Ambulatory Visit (INDEPENDENT_AMBULATORY_CARE_PROVIDER_SITE_OTHER): Payer: Self-pay | Admitting: Gastroenterology

## 2021-09-04 ENCOUNTER — Encounter: Payer: Self-pay | Admitting: Gastroenterology

## 2021-09-04 ENCOUNTER — Other Ambulatory Visit: Payer: Self-pay

## 2021-09-04 ENCOUNTER — Encounter: Payer: Self-pay | Admitting: Psychiatry

## 2021-09-04 VITALS — BP 95/56 | HR 70 | Temp 98.3°F | Ht 63.0 in | Wt 94.0 lb

## 2021-09-04 VITALS — BP 99/63 | HR 72 | Temp 98.1°F | Wt 95.0 lb

## 2021-09-04 DIAGNOSIS — Z634 Disappearance and death of family member: Secondary | ICD-10-CM

## 2021-09-04 DIAGNOSIS — F1991 Other psychoactive substance use, unspecified, in remission: Secondary | ICD-10-CM

## 2021-09-04 DIAGNOSIS — F32A Depression, unspecified: Secondary | ICD-10-CM | POA: Diagnosis not present

## 2021-09-04 DIAGNOSIS — F17200 Nicotine dependence, unspecified, uncomplicated: Secondary | ICD-10-CM

## 2021-09-04 DIAGNOSIS — R195 Other fecal abnormalities: Secondary | ICD-10-CM

## 2021-09-04 DIAGNOSIS — F1021 Alcohol dependence, in remission: Secondary | ICD-10-CM | POA: Diagnosis not present

## 2021-09-04 DIAGNOSIS — Z87898 Personal history of other specified conditions: Secondary | ICD-10-CM

## 2021-09-04 DIAGNOSIS — Z8659 Personal history of other mental and behavioral disorders: Secondary | ICD-10-CM

## 2021-09-04 DIAGNOSIS — K529 Noninfective gastroenteritis and colitis, unspecified: Secondary | ICD-10-CM

## 2021-09-04 MED ORDER — BUDESONIDE 3 MG PO CPEP
9.0000 mg | ORAL_CAPSULE | Freq: Every day | ORAL | 2 refills | Status: AC
Start: 2021-09-04 — End: 2021-10-04

## 2021-09-04 NOTE — Progress Notes (Signed)
Cephas Darby, MD [address]  [place]  [place], [place] [address]  Main: [phone]  Fax: [phone]    Gastroenterology Consultation  Referring Provider:     [name], MD Primary Care Physician:  [name], MD Primary Gastroenterologist:  Dr. Cephas Darby Reason for Consultation:     Chronic diarrhea        HPI:   Carla Martinez is a 60 y.o. female referred by Dr. Einar Pheasant, Jobe Marker, MD  for consultation & management of chronic diarrhea.  Patient has history of chronic tobacco use, prior history of heavy alcohol use who is referred for evaluation of approximately 6 to 8 weeks history of Nonbloody diarrhea associated with lower abdominal pain, abdominal bloating and low back pain.  Patient underwent stool studies which were negative for infection, fecal calprotectin levels were elevated to 162.  Patient reports that her diarrhea has somewhat slowed down but has significant bloating and continued weight loss.  Her oldest daughter passed away approximately 6 months ago and she has been suffering from prolonged grief reaction.  She has to take care of 72 year old mom as well as her son who is totally dependent on her.  Patient is overwhelmed, has anxiety as well as insomnia.  She is on trazodone.  She was started on Atarax which does not help much.  She is unable to get psychiatry appointment.  She does not have anemia.  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  She said she had 6 children that has caused the prolapse.  Follow-up visit 09/04/2021 Patient is here for follow-up of her GI symptoms.  She was supposed to undergo upper endoscopy and colonoscopy as outpatient on 9/13, however developed severe nausea, vomiting and was hospitalized.  CT abdomen and pelvis with contrast revealed multiple loops of small bowel with moderately thickened wall in the left  upper quadrant.  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  She underwent cardiac cath which revealed mild nonobstructive coronary artery disease.  Patient has seen cardiology as outpatient on 10/13 and 11/9.  She is undergoing further cardiac work-up with Holter monitor for 7 days, repeat echocardiogram.  Patient is scheduled for echocardiogram on 11/29 and cardiology follow-up on 11/30.  Patient is currently asymptomatic, denies shortness of breath, swelling of legs.  Today, her main concern is abdominal distention and she is currently constipated.  She is gaining weight, able to drink protein supplements.  Patient denies any rectal bleeding, nausea or vomiting.  Patient has not tried any stool softener.  NSAIDs: None  Antiplts/Anticoagulants/Anti thrombotics: None  GI Procedures: Few years ago  Past Medical History:  Diagnosis Date  . Anxiety   . Depression     Past Surgical History:  Procedure Laterality Date  . CHOLECYSTECTOMY    . COLON RESECTION     12" removed  . LEFT HEART CATH AND CORONARY ANGIOGRAPHY N/A 07/05/2021   Procedure: LEFT HEART CATH AND CORONARY ANGIOGRAPHY;  Surgeon: Andrez Grime, MD;  Location: [facility];  Service: Cardiovascular;  Laterality: N/A;  . MENISCUS REPAIR Bilateral     Current Outpatient Medications:  .  amitriptyline (ELAVIL) 25 MG tablet, Take 1 tablet (25 mg total) by mouth at bedtime.,  Disp: 30 tablet, Rfl: 2 .  budesonide (ENTOCORT EC) 3 MG 24 hr capsule, Take 3 capsules (9 mg total) by mouth daily., Disp: 90 capsule, Rfl: 2 .  busPIRone (BUSPAR) 10 MG tablet, Take 1 tablet (10 mg total) by mouth 3 (three) times daily. (Patient taking differently: Take 15 mg by mouth 3 (three) times daily.), Disp: 90 tablet, Rfl: 1 .  calcium carbonate (OS-CAL -  DOSED IN MG OF ELEMENTAL CALCIUM) 1250 (500 Ca) MG tablet, Take 1 tablet by mouth as directed., Disp: , Rfl:  .  folic acid (FOLVITE) 1 MG tablet, Take 1 tablet (1 mg total) by mouth daily., Disp: 30 tablet, Rfl: 1 .  gabapentin (NEURONTIN) 100 MG capsule, Take 100 mg by mouth 3 (three) times daily., Disp: , Rfl:  .  hydrOXYzine (ATARAX/VISTARIL) 50 MG tablet, TAKE 2 TABLETS BY MOUTH THREE TIMES DAILY AS NEEDED FOR ANXIETY, Disp: 180 tablet, Rfl: 0 .  lisinopril (ZESTRIL) 2.5 MG tablet, Take 1 tablet (2.5 mg total) by mouth daily., Disp: 30 tablet, Rfl: 1 .  Magnesium 500 MG TABS, Take 1 tablet by mouth daily., Disp: , Rfl:  .  metoprolol succinate (TOPROL-XL) 25 MG 24 hr tablet, Take 0.5 tablets (12.5 mg total) by mouth daily., Disp: 15 tablet, Rfl: 1 .  midodrine (PROAMATINE) 10 MG tablet, Take 1 tablet (10 mg total) by mouth 3 (three) times daily with meals., Disp: 90 tablet, Rfl: 2 .  Multiple Vitamins-Minerals (WOMENS 50+ ADVANCED PO), Take 1 tablet by mouth daily., Disp: , Rfl:  .  oxyCODONE (OXY IR/ROXICODONE) 5 MG immediate release tablet, Take 1 tablet (5 mg total) by mouth every 6 (six) hours as needed for moderate pain., Disp: 25 tablet, Rfl: 0 .  pantoprazole (PROTONIX) 40 MG tablet, Take 1 tablet (40 mg total) by mouth daily., Disp: 30 tablet, Rfl: 0 .  QUEtiapine (SEROQUEL) 100 MG tablet, Take 1 tablet (100 mg total) by mouth 3 (three) times daily., Disp: 270 tablet, Rfl: 1 .  traZODone (DESYREL) 150 MG tablet, Take 300 mg by mouth at bedtime., Disp: , Rfl:  .  vitamin B-12 (CYANOCOBALAMIN) 1000 MCG tablet, Take 1,000 mcg by mouth daily., Disp: , Rfl:  .  zolpidem (AMBIEN) 10 MG tablet, Take 10 mg by mouth at bedtime., Disp: , Rfl:     Family History  Problem Relation Age of Onset  . Hypertension Mother   . Heart disease Mother   . Stroke Mother   . Leukemia Sister   . Heart disease Sister   . Down syndrome Son   . Stroke Son   . Diabetes type I Child      Social History    Tobacco Use  . Smoking status: Every Day    Packs/day: 0.25    Years: 17.00    Pack years: 4.25    Types: Cigarettes    Start date: 11/25/2003  . Smokeless tobacco: Never  . Tobacco comments:    used to smoke 1PPD but has decreased. Down to 3  cigarettes daily  Vaping Use  . Vaping Use: Never used  Substance Use Topics  . Alcohol use: Yes    Comment: 3-4 glasses a night  . Drug use: Never    Allergies as of 09/04/2021  . (No Known Allergies)    Review of Systems:    All systems reviewed and negative except where noted in HPI.   Physical Exam:  BP (!) 95/56   Pulse 70   Temp 98.3 F (  36.8 C) (Oral)   Ht 5\' 3"  (1.6 m)   Wt 94 lb (42.6 kg)   BMI 16.65 kg/m  No LMP recorded. Patient is postmenopausal.  General:   Alert, thin built, malnourished, pleasant and cooperative in NAD Head:  Normocephalic and atraumatic, bitemporal wasting. Eyes:  Sclera clear, no icterus.   Conjunctiva pink. Ears:  Normal auditory acuity. Nose:  No deformity, discharge, or lesions. Mouth:  No deformity or lesions,oropharynx pink & moist. Neck:  Supple; no masses or thyromegaly. Lungs:  Respirations even and unlabored.  Clear throughout to auscultation.   No wheezes, crackles, or rhonchi. No acute distress. Heart:  Regular rate and rhythm; no murmurs, clicks, rubs, or gallops. Abdomen:  Normal bowel sounds. Soft, non-tender and moderately distended secondary to bloating without masses, hepatosplenomegaly or hernias noted.  No guarding or rebound tenderness.   Rectal: Not performed Msk:  Symmetrical without gross deformities. Good, equal movement & strength bilaterally. Pulses:  Normal pulses noted. Extremities:  No clubbing or edema.  No cyanosis. Neurologic:  Alert and oriented x3;  grossly normal neurologically. Skin:  Intact without significant lesions or rashes. No jaundice. Psych:  Alert and cooperative. Normal mood and affect.  Imaging Studies: None  Assessment and Plan:   Disney Ruggiero is a 60 y.o. Caucasian female with history of colon resection, chronic tobacco use, history of alcohol use is seen in consultation for chronic diarrhea, lower abdominal pain, low back pain and elevated fecal calprotectin levels.  Her stool studies are negative for infectious etiology.  Patient was planned to undergo upper endoscopy and colonoscopy with biopsies.  However, patient was hospitalized secondary to altered mental status and Takotsubo cardiomyopathy.  Altered mental status was secondary to usage of multiple psychotropic medications.  Her AMS has currently resolved.  Patient is clinically doing well from cardiac standpoint.  She has a repeat echocardiogram pending.  Patient is closely followed by [facility] cardiology  While waiting on repeat echocardiogram results and cardiac clearance, I recommend empiric trial of rifaximin 550 mg twice daily for possible bacterial overgrowth, samples provided Recommend short course of budesonide 3 mg 3 pills daily Patient is already scheduled for MR enterography on 11/29 For constipation, advised patient to start MiraLAX 1-2 doses daily   Follow up after the MRE results in 2-3 months   Cephas Darby, MD
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
## 2021-09-04 NOTE — Progress Notes (Deleted)
error 

## 2021-09-04 NOTE — Patient Instructions (Addendum)
Please obtain cardiology clearance for being on psychotropic medications like amitriptyline or Elavil, Seroquel, hydroxyzine , trazodone , Buspar. Please contact your cardiologist and ask them to send [place] the documentation about this for further medication changes to be made.

## 2021-09-05 ENCOUNTER — Telehealth: Payer: Self-pay

## 2021-09-05 DIAGNOSIS — Z8659 Personal history of other mental and behavioral disorders: Secondary | ICD-10-CM | POA: Insufficient documentation

## 2021-09-05 DIAGNOSIS — F32A Depression, unspecified: Secondary | ICD-10-CM | POA: Insufficient documentation

## 2021-09-05 DIAGNOSIS — F1091 Alcohol use, unspecified, in remission: Secondary | ICD-10-CM | POA: Insufficient documentation

## 2021-09-05 DIAGNOSIS — F1991 Other psychoactive substance use, unspecified, in remission: Secondary | ICD-10-CM | POA: Insufficient documentation

## 2021-09-05 DIAGNOSIS — F509 Eating disorder, unspecified: Secondary | ICD-10-CM | POA: Insufficient documentation

## 2021-09-05 DIAGNOSIS — Z87898 Personal history of other specified conditions: Secondary | ICD-10-CM | POA: Insufficient documentation

## 2021-09-05 NOTE — Telephone Encounter (Signed)
Called patient about medicine being sent in she understands and understands how dr Marius Ditch wants her to take the medicine

## 2021-09-06 ENCOUNTER — Other Ambulatory Visit: Payer: Self-pay

## 2021-09-06 MED ORDER — TRAZODONE HCL 150 MG PO TABS: 300.0000 mg | ORAL_TABLET | Freq: Every day | ORAL | 0 refills | Status: DC

## 2021-09-06 MED ORDER — GABAPENTIN 100 MG PO CAPS: 100.0000 mg | ORAL_CAPSULE | Freq: Three times a day (TID) | ORAL | 0 refills | Status: DC

## 2021-09-06 MED ORDER — ZOLPIDEM TARTRATE 10 MG PO TABS: 10.0000 mg | ORAL_TABLET | Freq: Every day | ORAL | 0 refills | Status: DC

## 2021-09-06 NOTE — Telephone Encounter (Signed)
Reviewed notes from recent psychiatry visit.  It looks like Dr. Shea Evans will no longer see her due to "conflict of interest" and has referred her to another provider.  She needs a psychiatrist and will need to find one to soon as possible in order to receive continued refills.  I will provide her with a 30-day supply of each medication, but she will need to reach out to her psychiatrist office to figure out where she has been referred to.

## 2021-09-06 NOTE — Telephone Encounter (Signed)
Pt called in requesting a call back stated she needs all the medication refill . Please advise [phone]

## 2021-09-09 ENCOUNTER — Encounter: Payer: Self-pay | Admitting: Family Medicine

## 2021-09-10 ENCOUNTER — Other Ambulatory Visit: Payer: Self-pay | Admitting: Family Medicine

## 2021-09-10 DIAGNOSIS — F330 Major depressive disorder, recurrent, mild: Secondary | ICD-10-CM

## 2021-09-10 DIAGNOSIS — F5000 Anorexia nervosa, unspecified: Secondary | ICD-10-CM

## 2021-09-10 NOTE — Telephone Encounter (Signed)
Spoke to pt. Suggested she askabout financial aid at some of the psych offices since her insurance does not cover mental health. She stated there are a few medications that she still needed filled but was not sure of all the names. I asked that she put them in a MyChart message so we can send them to the appropriate provider.

## 2021-09-15 ENCOUNTER — Encounter (HOSPITAL_COMMUNITY): Payer: Self-pay

## 2021-09-15 NOTE — Pre-Procedure Instructions (Signed)
PCP - Dr. Waunita Schooner Cardiologist - Dr. Hermenia Fiscal  EKG - 08/01/21 Chest x-ray - 08/01/21 ECHO - 07/03/21 Cardiac Cath - 07/05/21 CPAP - no   Blood Thinner Instructions: n/a Aspirin Instructions: n/a  ERAS Protcol - yes  COVID TEST- nio  Anesthesia review: yes  -------------  SDW INSTRUCTIONS:  Your procedure is scheduled on 09/17/21. Please report to [facility] Main Entrance "A" at 05:30 A.M., and check in at the Admitting office. Call this number if you have problems the morning of surgery: [phone]   Remember: Do not eat  after midnight the night before your surgery  You may drink clear liquids until 05:00 the morning of your surgery.   Clear liquids allowed are: Water, Non-Citrus Juices (without pulp), Carbonated Beverages, Clear Tea, Black Coffee Only, and Gatorade   Medications to take morning of surgery with a sip of water include: Gabapentine, metoprolol, protonix, seroquel, entocort.  As of today, STOP taking any Aspirin (unless otherwise instructed by your surgeon), Aleve, Naproxen, Ibuprofen, Motrin, Advil, Goody's, BC's, all herbal medications, fish oil, and all vitamins.    The Morning of Surgery Do not wear jewelry, make-up or nail polish. Do not wear lotions, powders, or perfumes/colognes, or deodorant Do not shave 48 hours prior to surgery.   Men may shave face and neck. Do not bring valuables to the hospital. [facility] is not responsible for any belongings or valuables.  If you are a smoker, DO NOT Smoke 24 hours prior to surgery  If you wear a CPAP at night please bring your mask the morning of surgery   Remember that you must have someone to transport you home after your surgery, and remain with you for 24 hours if you are discharged the same day.  Please bring cases for contacts, glasses, hearing aids, dentures or bridgework because it cannot be worn into surgery.   Patients discharged the day of surgery will not be allowed to drive home.    Please shower the NIGHT BEFORE/MORNING OF SURGERY (use antibacterial soap like DIAL soap if possible). Wear comfortable clothes the morning of surgery. Oral Hygiene is also important to reduce your risk of infection.  Remember - BRUSH YOUR TEETH THE MORNING OF SURGERY WITH YOUR REGULAR TOOTHPASTE  Patient denies shortness of breath, fever, cough and chest pain.

## 2021-09-16 ENCOUNTER — Encounter (HOSPITAL_COMMUNITY): Payer: Self-pay | Admitting: Vascular Surgery

## 2021-09-16 ENCOUNTER — Telehealth: Payer: Self-pay | Admitting: Family Medicine

## 2021-09-16 NOTE — Progress Notes (Signed)
Anesthesia Chart Review: Carla Martinez  Case: [id number] Date/Time: 09/17/21 0745   Procedure: MRI LUMBAR WITH AND WITHOUT CONTRAST; MRI ABDOMEN WITH AND WITHOUT WITH ANESTHESIA   Anesthesia type: General   Pre-op diagnosis:      ABNORMAL CT SCAN OF LUMBAR     LIVER DISEASE, LIVER LESION   Location: [facility] OR RADIOLOGY ROOM / [place] OR   Surgeons: Radiologist, Medication, MD       DISCUSSION: Patient is a 60 year old female scheduled for the above procedure. PCP [name], MD previously contacted anesthesia department about this patient's that will likely receive general anesthesia for planned MRI. She had also reached out to [facility] who had advised "minimize sedation and BP shifts". On 08/20/21, I reviewed Discharge Summary from 07/02/21-07/09/21 and last [facility] records with anesthesiologist Myrtie Soman, MD at that time---would anticipate she could proceed with MRI if HF symptoms controlled and no withdrawal symptoms. 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 Three month follow-up planned with echo and 7 day event monitor to be done in the interim to re-assess LVEF and to assess afib burden, as he is "not comfortable prescribing anticoagulation at this time given her recent history of drug abuse; we will evaluate for asymptomatic atrial fibrillation with a Holter monitor and determine need for anticoagulation going forward."  History includes smoking, Takotsubo Cardiomyopathy, afib, anxiety, depression, colon resection, cholecystectomy. 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 Required Precedex infusion. UDS + amphetamines, cannabis, tricyclic antidepressants, phencyclidine. Cardiac cath  showed mild non-obstructive CAD. (Lisinopril held due to hypotension.)  Anesthesia team to evaluate on the day of procedure.    VS: Filed Vital Signs Vital Sign Reading Time Taken Comments  Blood Pressure 102/64 08/28/2021 11:45 AM EST    Pulse 73 08/28/2021 11:45 AM EST    Temperature - -    Respiratory Rate 17 08/28/2021 11:45 AM EST    Oxygen Saturation 94% 08/28/2021 11:45 AM EST    Inhaled Oxygen Concentration - -    Weight 39.5 kg (87 lb) 08/28/2021 11:45 AM EST    Height 160 cm (5\' 3" ) 08/28/2021 11:45 AM EST    Body Mass Index 15.41 08/28/2021 11:45 AM EST      PROVIDERS: [name], MD is PCP Sherri Sear, MD is GI [name], MD is Cardiologist [facility], see [facility])    LABS: For day of procedure as indicated. Currently, last results seen include: Lab Results  Component Value Date   WBC 8.1 08/01/2021   HGB 11.6 (L) 08/01/2021   HCT 34.2 (L) 08/01/2021   PLT 255 08/01/2021   GLUCOSE 110 (H) 08/01/2021   ALT 14 08/01/2021   AST 18 08/01/2021   NA 134 (L) 08/01/2021   K 3.7 08/01/2021   CL 94 (L) 08/01/2021   CREATININE 1.01 (H) 08/01/2021   BUN 15 08/01/2021   CO2 30 08/01/2021   TSH 3.03 07/24/2021   INR 1.1 07/02/2021     IMAGES: CT Head 08/01/21: IMPRESSION: 1. No evidence of acute intracranial abnormality. 2. Minimal paranasal sinusitis, chronic appearing.  CXR 08/01/21: FINDINGS: Heart size and vascularity normal. Interval improvement in bibasilar airspace disease which may be clearing pneumonia. No significant effusion. IMPRESSION: Improving bibasilar infiltrates.   CT Abd/pelvis 07/02/21: IMPRESSION: 1.  Multiple loops of small bowel with moderately thickened wall in the left upper quadrant suggesting nonspecific infectious or inflammatory enteritis. 2. Moderate periportal edema is nonspecific and can be seen in the setting of heart failure, hepatitis, among other etiologies. Correlate with liver function tests and  history, and consider correlation with hepatitis testing as indicated. 3. Indeterminate hypodense lesion in the liver with peripheral nodular enhancement most likely reflects a hemangioma; however, recommend nonemergent outpatient liver protocol MRI of the abdomen with and without contrast for characterization. 4. 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 Recommend nonemergent lumbar spine MRI with and without contrast for further evaluation. 5. Age-indeterminate compression deformity of the L2 vertebral body. Correlate with point tenderness, and MRI may be considered for evaluation of chronicity. If this MRI is obtained during this ED/inpatient visit, postcontrast imaging is recommended for evaluation of the suspected nerve sheath tumor.    EKG: 08/01/21: Normal sinus rhythm Nonspecific T wave abnormality Prolonged QT (QT/QTcB 422/524 ms) Abnormal ECG   CV: Cardiac cath 07/05/21:   Prox RCA to Mid RCA lesion is 25% stenosed.   Ost Cx to Prox Cx lesion is 25% stenosed.   LV end diastolic pressure is moderately elevated.   There is no aortic valve stenosis.   Conclusion Mild non-obstructive coronary artery disease.  Moderately elevated LVEDP of 22 mmHg, with systemic hypotension.   Recommendations: Medical management of Stress cardiomyopathy (Takotsubo). Initiation of beta blocker and ACE/ARB as BP allows.  Aspirin 81 mg indefinitely Aggressive secondary prevention   Echo 07/03/21: IMPRESSIONS   1. Left ventricular ejection fraction, by estimation, is 25 to 30%. The  left ventricle has severely decreased function. The left ventricle  demonstrates regional wall motion abnormalities (see scoring  diagram/findings for description). The left  ventricular internal cavity size was mildly to moderately dilated. Left  ventricular diastolic parameters were normal.   2.  Right ventricular systolic function is normal. The right ventricular  size is normal.   3. Left atrial size was mildly dilated.   4. The mitral valve is normal in structure. Mild mitral valve  regurgitation.   5. The aortic valve is normal in structure. Aortic valve regurgitation is  trivial.    Past Medical History:  Diagnosis Date   Anxiety    Depression    PAF (paroxysmal atrial fibrillation) (HCC)    Polysubstance abuse ([place])    Takotsubo cardiomyopathy     Past Surgical History:  Procedure Laterality Date   CHOLECYSTECTOMY     COLON RESECTION     12" removed   LEFT HEART CATH AND CORONARY ANGIOGRAPHY N/A 07/05/2021   Procedure: LEFT HEART CATH AND CORONARY ANGIOGRAPHY;  Surgeon: Andrez Grime, MD;  Location: [place] [facility];  Service: Cardiovascular;  Laterality: N/A;   MENISCUS REPAIR Bilateral     MEDICATIONS: No current facility-administered medications for this encounter.    amitriptyline (ELAVIL) 25 MG tablet   B Complex-C (SUPER B COMPLEX PO)   budesonide (ENTOCORT EC) 3 MG 24 hr capsule   calcium carbonate (OS-CAL - DOSED IN MG OF ELEMENTAL CALCIUM) 1250 (500 Ca) MG tablet   gabapentin (NEURONTIN) 100 MG capsule   Magnesium 250 MG TABS   Multiple Vitamins-Minerals (MULTIVITAMIN WITH MINERALS) tablet   pantoprazole (PROTONIX) 40 MG tablet   QUEtiapine (SEROQUEL) 100 MG tablet   traZODone (DESYREL) 100 MG tablet   traZODone (DESYREL) 150 MG tablet  TURMERIC PO   zolpidem (AMBIEN) 10 MG tablet   hydrOXYzine (ATARAX/VISTARIL) 50 MG tablet   lisinopril (ZESTRIL) 2.5 MG tablet   metoprolol succinate (TOPROL-XL) 25 MG 24 hr tablet   midodrine (PROAMATINE) 10 MG tablet     Myra Gianotti, PA-C Surgical Short Stay/Anesthesiology [facility] Phone [phone] [facility] Phone [phone] 09/16/2021 4:41 PM

## 2021-09-16 NOTE — H&P (View-Only) (Signed)
Anesthesia Chart Review: Carla Martinez  Case: [id number] Date/Time: 09/17/21 0745   Procedure: MRI LUMBAR WITH AND WITHOUT CONTRAST; MRI ABDOMEN WITH AND WITHOUT WITH ANESTHESIA   Anesthesia type: General   Pre-op diagnosis:      ABNORMAL CT SCAN OF LUMBAR     LIVER DISEASE, LIVER LESION   Location: [facility] OR RADIOLOGY RO[facility] / [place] OR   Surgeons: Radiologist, Medication, MD       DISCUSSION: Patient is a 60 year old female scheduled for the above procedure. PCP Carla Noe, MD previously contacted anesthesia department about this patient's that will likely receive general anesthesia for planned MRI. She had also reached out to [facility] who had advised "minimize sedation and BP shifts". On 08/20/21, I reviewed Discharge Summary from 07/02/21-07/09/21 and last [facility] records with anesthesiologist Myrtie Soman, MD at that time---would anticipate she could proceed with MRI if HF symptoms controlled and no withdrawal symptoms. 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 Three month follow-up planned with echo and 7 day event monitor to be done in the interim to re-assess LVEF and to assess afib burden, as he is "not comfortable prescribing anticoagulation at this time given her recent history of drug abuse; we will evaluate for asymptomatic atrial fibrillation with a Holter monitor and determine need for anticoagulation going forward."  History includes smoking, Takotsubo Cardiomyopathy, afib, anxiety, depression, colon resection, cholecystectomy. 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 Required Precedex infusion. UDS + amphetamines, cannabis, tricyclic antidepressants, phencyclidine. Cardiac cath  showed mild non-obstructive CAD. (Lisinopril held due to hypotension.)  Anesthesia team to evaluate on the day of procedure.    VS: Filed Vital Signs Vital Sign Reading Time Taken Comments  Blood Pressure 102/64 08/28/2021 11:45 AM EST    Pulse 73 08/28/2021 11:45 AM EST    Temperature - -    Respiratory Rate 17 08/28/2021 11:45 AM EST    Oxygen Saturation 94% 08/28/2021 11:45 AM EST    Inhaled Oxygen Concentration - -    Weight 39.5 kg (87 lb) 08/28/2021 11:45 AM EST    Height 160 cm (5\' 3" ) 08/28/2021 11:45 AM EST    Body Mass Index 15.41 08/28/2021 11:45 AM EST      PROVIDERS: Carla Noe, MD is PCP Carla Sear, MD is GI Carla Angelica, MD is Cardiologist [facility], see [facility])    LABS: For day of procedure as indicated. Currently, last results seen include: Lab Results  Component Value Date   WBC 8.1 08/01/2021   HGB 11.6 (L) 08/01/2021   HCT 34.2 (L) 08/01/2021   PLT 255 08/01/2021   GLUCOSE 110 (H) 08/01/2021   ALT 14 08/01/2021   AST 18 08/01/2021   NA 134 (L) 08/01/2021   K 3.7 08/01/2021   CL 94 (L) 08/01/2021   CREATININE 1.01 (H) 08/01/2021   BUN 15 08/01/2021   CO2 30 08/01/2021   TSH 3.03 07/24/2021   INR 1.1 07/02/2021     IMAGES: CT Head 08/01/21: IMPRESSION: 1. No evidence of acute intracranial abnormality. 2. Minimal paranasal sinusitis, chronic appearing.  CXR 08/01/21: FINDINGS: Heart size and vascularity normal. Interval improvement in bibasilar airspace disease which may be clearing pneumonia. No significant effusion. IMPRESSION: Improving bibasilar infiltrates.   CT Abd/pelvis 07/02/21: IMPRESSION: 1.  Multiple loops of small bowel with moderately thickened wall in the left upper quadrant suggesting nonspecific infectious or inflammatory enteritis. 2. Moderate periportal edema is nonspecific and can be seen in the setting of heart failure, hepatitis, among other etiologies. Correlate with liver function tests and  history, and consider correlation with hepatitis testing as indicated. 3. Indeterminate hypodense lesion in the liver with peripheral nodular enhancement most likely reflects a hemangioma; however, recommend nonemergent outpatient liver protocol MRI of the abdomen with and without contrast for characterization. 4. 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 Recommend nonemergent lumbar spine MRI with and without contrast for further evaluation. 5. Age-indeterminate compression deformity of the L2 vertebral body. Correlate with point tenderness, and MRI may be considered for evaluation of chronicity. If this MRI is obtained during this ED/inpatient visit, postcontrast imaging is recommended for evaluation of the suspected nerve sheath tumor.    EKG: 08/01/21: Normal sinus rhythm Nonspecific T wave abnormality Prolonged QT (QT/QTcB 422/524 ms) Abnormal ECG   CV: Cardiac cath 07/05/21:   Prox RCA to Mid RCA lesion is 25% stenosed.   Ost Cx to Prox Cx lesion is 25% stenosed.   LV end diastolic pressure is moderately elevated.   There is no aortic valve stenosis.   Conclusion Mild non-obstructive coronary artery disease.  Moderately elevated LVEDP of 22 mmHg, with systemic hypotension.   Recommendations: Medical management of Stress cardiomyopathy (Takotsubo). Initiation of beta blocker and ACE/ARB as BP allows.  Aspirin 81 mg indefinitely Aggressive secondary prevention   Echo 07/03/21: IMPRESSIONS   1. Left ventricular ejection fraction, by estimation, is 25 to 30%. The  left ventricle has severely decreased function. The left ventricle  demonstrates regional wall motion abnormalities (see scoring  diagram/findings for description). The left  ventricular internal cavity size was mildly to moderately dilated. Left  ventricular diastolic parameters were normal.   2.  Right ventricular systolic function is normal. The right ventricular  size is normal.   3. Left atrial size was mildly dilated.   4. The mitral valve is normal in structure. Mild mitral valve  regurgitation.   5. The aortic valve is normal in structure. Aortic valve regurgitation is  trivial.    Past Medical History:  Diagnosis Date   Anxiety    Depression    PAF (paroxysmal atrial fibrillation) (HCC)    Polysubstance abuse ([place])    Takotsubo cardiomyopathy     Past Surgical History:  Procedure Laterality Date   CHOLECYSTECTOMY     COLON RESECTION     12" removed   LEFT HEART CATH AND CORONARY ANGIOGRAPHY N/A 07/05/2021   Procedure: LEFT HEART CATH AND CORONARY ANGIOGRAPHY;  Surgeon: Andrez Grime, MD;  Location: [place] [facility];  Service: Cardiovascular;  Laterality: N/A;   MENISCUS REPAIR Bilateral     MEDICATIONS: No current facility-administered medications for this encounter.    amitriptyline (ELAVIL) 25 MG tablet   B Complex-C (SUPER B COMPLEX PO)   budesonide (ENTOCORT EC) 3 MG 24 hr capsule   calcium carbonate (OS-CAL - DOSED IN MG OF ELEMENTAL CALCIUM) 1250 (500 Ca) MG tablet   gabapentin (NEURONTIN) 100 MG capsule   Magnesium 250 MG TABS   Multiple Vitamins-Minerals (MULTIVITAMIN WITH MINERALS) tablet   pantoprazole (PROTONIX) 40 MG tablet   QUEtiapine (SEROQUEL) 100 MG tablet   traZODone (DESYREL) 100 MG tablet   traZODone (DESYREL) 150 MG tablet  TURMERIC PO   zolpidem (AMBIEN) 10 MG tablet   hydrOXYzine (ATARAX/VISTARIL) 50 MG tablet   lisinopril (ZESTRIL) 2.5 MG tablet   metoprolol succinate (TOPROL-XL) 25 MG 24 hr tablet   midodrine (PROAMATINE) 10 MG tablet     Myra Gianotti, PA-C Surgical Short Stay/Anesthesiology [facility] Phone [phone] [facility] Phone [phone] 09/16/2021 4:41 PM

## 2021-09-16 NOTE — Anesthesia Preprocedure Evaluation (Addendum)
Anesthesia Evaluation  Patient identified by MRN, date of birth, ID band Patient awake    Reviewed: Allergy & Precautions, NPO status , Patient's Chart, lab work & pertinent test results  Airway Mallampati: II  TM Distance: >3 FB Neck ROM: Full    Dental  (+) Edentulous Upper, Edentulous Lower, Dental Advisory Given   Pulmonary neg pulmonary ROS, Current Smoker and Patient abstained from smoking.,    Pulmonary exam normal breath sounds clear to auscultation       Cardiovascular +CHF  Normal cardiovascular exam Rhythm:Regular Rate:Normal  Cardiac cath 07/05/21: . Prox RCA to Mid RCA lesion is 25% stenosed. Colon Flattery Cx to Prox Cx lesion is 25% stenosed. . LV end diastolic pressure is moderately elevated. . There is no aortic valve stenosis.  Conclusion 1. Mild non-obstructive coronary artery disease.  2. Moderately elevated LVEDP of 22 mmHg, with systemic hypotension.  Recommendations: 1. Medical management of Stress cardiomyopathy (Takotsubo)...   Echo 07/03/21: IMPRESSIONS  1. Left ventricular ejection fraction, by estimation, is 25 to 30%. The left ventricle has severely decreased function. The left ventricle demonstrates regional wall motion abnormalities (see scoring diagram/findings for description). The left ventricular internal cavity size was mildly to moderately dilated. Left ventricular diastolic parameters were normal.  2. Right ventricular systolic function is normal. The right ventricular size is normal.  3. Left atrial size was mildly dilated.  4. The mitral valve is normal in structure. Mild mitral valve  regurgitation.  5. The aortic valve is normal in structure. Aortic valve regurgitation is trivial.     Neuro/Psych PSYCHIATRIC DISORDERS Anxiety Depression negative neurological ROS     GI/Hepatic Neg liver ROS, GERD  ,  Endo/Other  negative endocrine ROS  Renal/GU negative Renal ROS      Musculoskeletal negative musculoskeletal ROS (+)   Abdominal   Peds  Hematology negative hematology ROS (+)   Anesthesia Other Findings   Reproductive/Obstetrics negative OB ROS                           Anesthesia Physical Anesthesia Plan  ASA: 4  Anesthesia Plan: General   Post-op Pain Management: Minimal or no pain anticipated   Induction: Intravenous  PONV Risk Score and Plan: 2 and Ondansetron, Dexamethasone, Treatment may vary due to age or medical condition and Midazolam  Airway Management Planned: Oral ETT  Additional Equipment: None  Intra-op Plan:   Post-operative Plan: Extubation in OR  Informed Consent: I have reviewed the patients History and Physical, chart, labs and discussed the procedure including the risks, benefits and alternatives for the proposed anesthesia with the patient or authorized representative who has indicated his/her understanding and acceptance.     Dental advisory given  Plan Discussed with: CRNA  Anesthesia Plan Comments: (PAT note written 09/16/2021 by Myra Gianotti, PA-C. )      Anesthesia Quick Evaluation

## 2021-09-16 NOTE — Telephone Encounter (Signed)
Ms. Carla Martinez called in and wanted to know if its something different that can be done due to she is set to have a MRI of abdomen and spine and they called her today and told her it would cost 2200 oop and she stated that her insurance ends on 12/2 and wanted to know what to do due to she stated she hasnt done a ultrasound or anything

## 2021-09-17 ENCOUNTER — Encounter (HOSPITAL_COMMUNITY): Admission: RE | Disposition: A | Discharge status: Home / Self Care | Payer: Self-pay | Source: Ambulatory Visit

## 2021-09-17 ENCOUNTER — Ambulatory Visit (HOSPITAL_COMMUNITY)
Admission: RE | Admit: 2021-09-17 | Discharge: 2021-09-17 | Disposition: A | Discharge status: Home / Self Care | Payer: No Typology Code available for payment source | Source: Ambulatory Visit | Attending: Family Medicine | Admitting: Family Medicine

## 2021-09-17 ENCOUNTER — Encounter (HOSPITAL_COMMUNITY): Payer: Self-pay

## 2021-09-17 ENCOUNTER — Other Ambulatory Visit: Payer: Self-pay

## 2021-09-17 ENCOUNTER — Ambulatory Visit (HOSPITAL_COMMUNITY): Payer: No Typology Code available for payment source | Admitting: Vascular Surgery

## 2021-09-17 ENCOUNTER — Ambulatory Visit (HOSPITAL_COMMUNITY)
Admission: RE | Admit: 2021-09-17 | Discharge: 2021-09-17 | Disposition: A | Discharge status: Home / Self Care | Payer: No Typology Code available for payment source | Source: Ambulatory Visit | Attending: Diagnostic Radiology | Admitting: Diagnostic Radiology

## 2021-09-17 DIAGNOSIS — K862 Cyst of pancreas: Secondary | ICD-10-CM | POA: Insufficient documentation

## 2021-09-17 DIAGNOSIS — M5136 Other intervertebral disc degeneration, lumbar region: Secondary | ICD-10-CM | POA: Diagnosis not present

## 2021-09-17 DIAGNOSIS — K769 Liver disease, unspecified: Secondary | ICD-10-CM | POA: Diagnosis present

## 2021-09-17 DIAGNOSIS — R9389 Abnormal findings on diagnostic imaging of other specified body structures: Secondary | ICD-10-CM | POA: Diagnosis not present

## 2021-09-17 DIAGNOSIS — D1803 Hemangioma of intra-abdominal structures: Secondary | ICD-10-CM | POA: Insufficient documentation

## 2021-09-17 DIAGNOSIS — G96191 Perineural cyst: Secondary | ICD-10-CM | POA: Insufficient documentation

## 2021-09-17 HISTORY — DX: Paroxysmal atrial fibrillation: I48.0

## 2021-09-17 HISTORY — DX: Other psychoactive substance abuse, uncomplicated: F19.10

## 2021-09-17 HISTORY — DX: Takotsubo syndrome: I51.81

## 2021-09-17 HISTORY — PX: RADIOLOGY WITH ANESTHESIA: SHX6223

## 2021-09-17 LAB — BASIC METABOLIC PANEL
Anion gap: 10 (ref 5–15)
BUN: 17 mg/dL (ref 6–20)
CO2: 34 mmol/L — ABNORMAL HIGH (ref 22–32)
Calcium: 9.2 mg/dL (ref 8.9–10.3)
Chloride: 94 mmol/L — ABNORMAL LOW (ref 98–111)
Creatinine, Ser: 1.14 mg/dL — ABNORMAL HIGH (ref 0.44–1.00)
GFR, Estimated: 56 mL/min — ABNORMAL LOW (ref >60)
Glucose, Bld: 90 mg/dL (ref 70–99)
Potassium: 3.2 mmol/L — ABNORMAL LOW (ref 3.5–5.1)
Sodium: 138 mmol/L (ref 135–145)

## 2021-09-17 IMAGING — MR MR ABDOMEN WO/W CM
10 of 18 series · 23 of 48 positions shown · IV contrast (G GAD)
Comparison: CT on [DATE]

CLINICAL DATA: Indeterminate liver lesion on recent CT.

EXAM:
MRI ABDOMEN WITHOUT AND WITH CONTRAST
TECHNIQUE: Multiplanar multisequence MR imaging of the abdomen was performed
both before and after the administration of intravenous contrast.
CONTRAST:  4mL GADAVIST GADOBUTROL 1 MMOL/ML IV SOLN

[Series 5: cor ssfse nav · coronal · 5.0mm · 0.66mm/px · 2 of 33 slices shown]
[im 1/33]
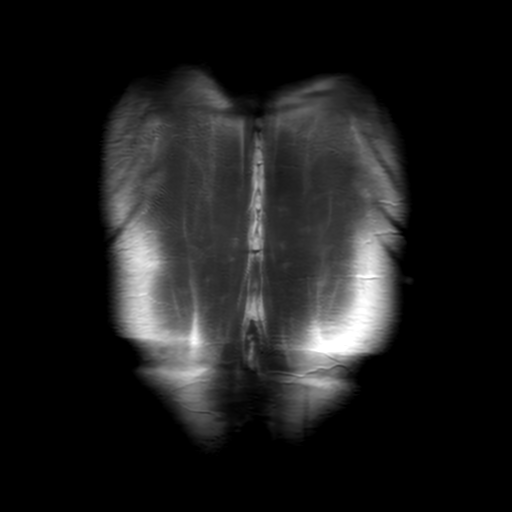
[im 33/33]
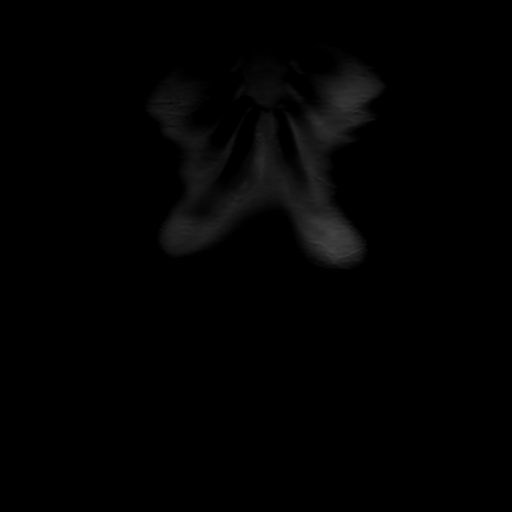

[Series 6: ax ssfse nav · axial · 6.0mm · 0.62mm/px · z∈[-84,+168]mm · 2 of 43 slices shown]
[im 1/43]
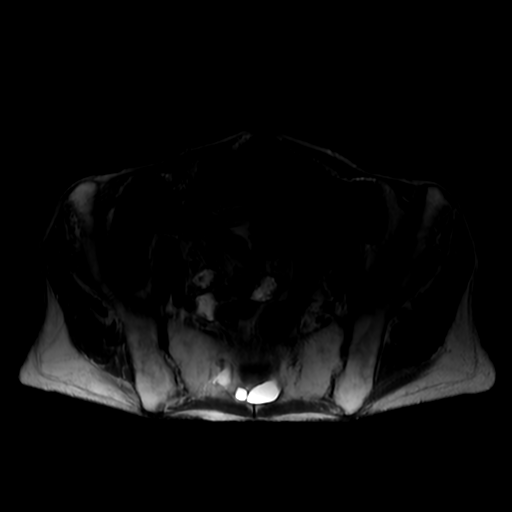
[im 43/43]
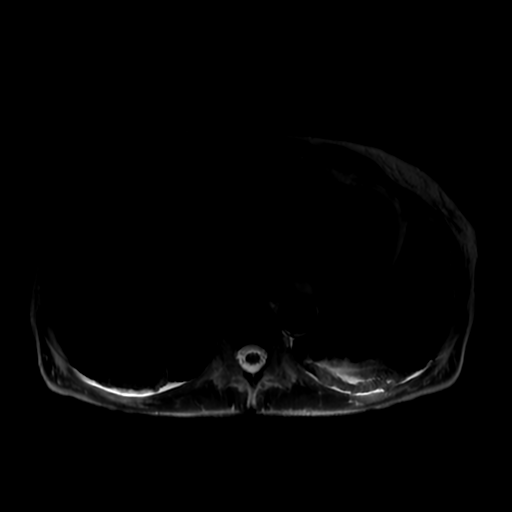

[Series 7: T2 fat-sat · axial · 6.0mm · 0.62mm/px · z∈[-84,+168]mm · 2 of 43 slices shown]
[im 1/43]
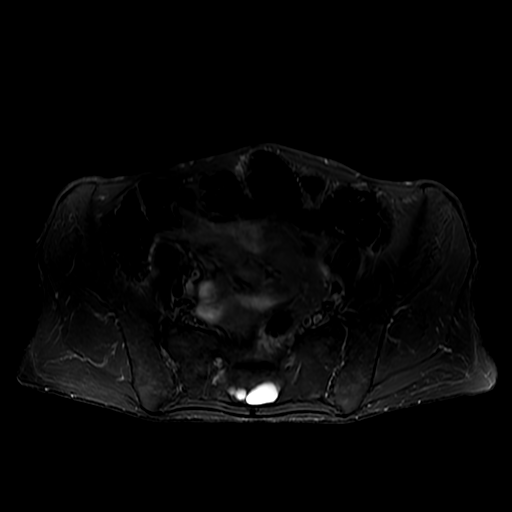
[im 43/43]
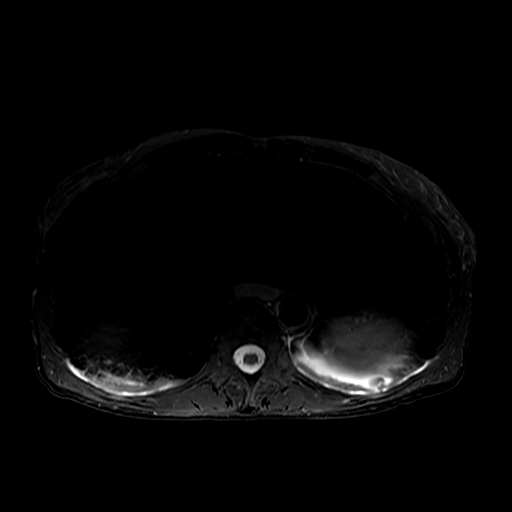

[Series 8: DWI b500 · axial · 8.0mm · 1.76mm/px · z∈[-102,+208]mm · 2 of 63 slices shown]
[im 1/63]
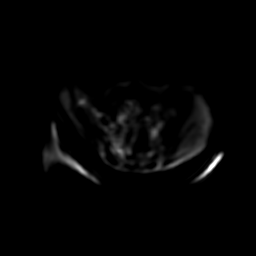
[im 63/63]
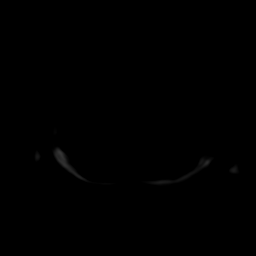

[Series 11: T1 dynamic · coronal · 3.3mm · 1.33mm/px · 3 of 96 slices shown]
[im 1/96]
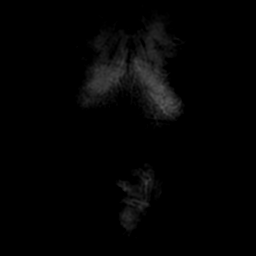
[im 48/96]
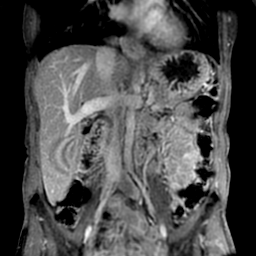
[im 96/96]
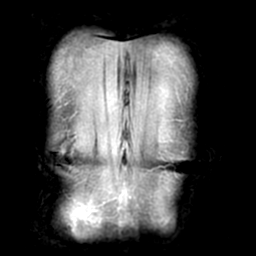

[Series 850: ADC · axial · 8.0mm · 1.76mm/px · 1 of 32 slices shown]
[im 1/32]
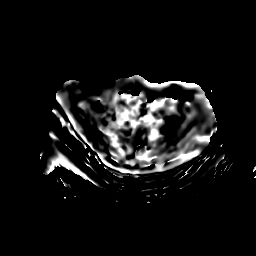

[Series 1000: T1 dynamic post-contrast · axial · non-contrast · 4.0mm · 0.70mm/px · z∈[-80,+174]mm · 3 of 128 slices shown (1 of 4)]
[im 1/128]
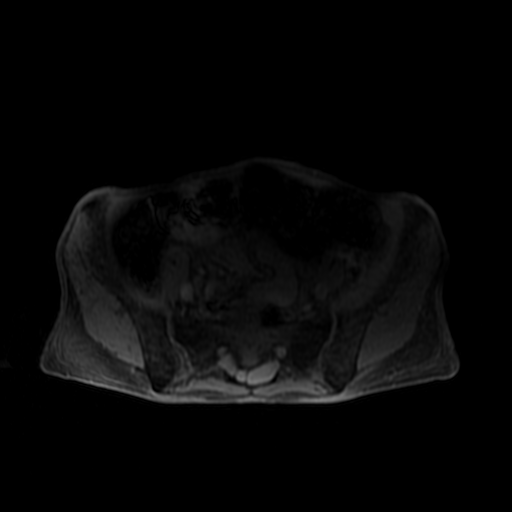
[im 64/128]
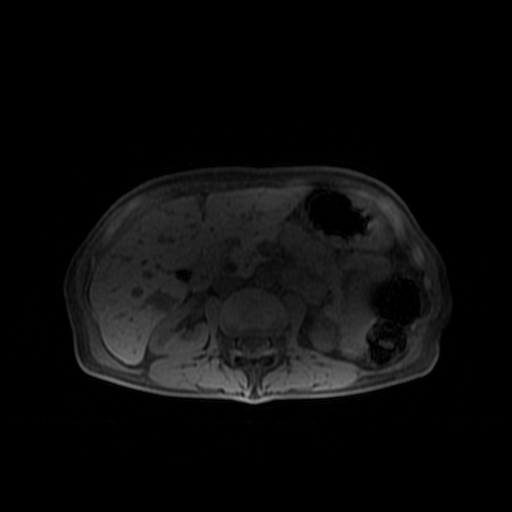
[im 128/128]
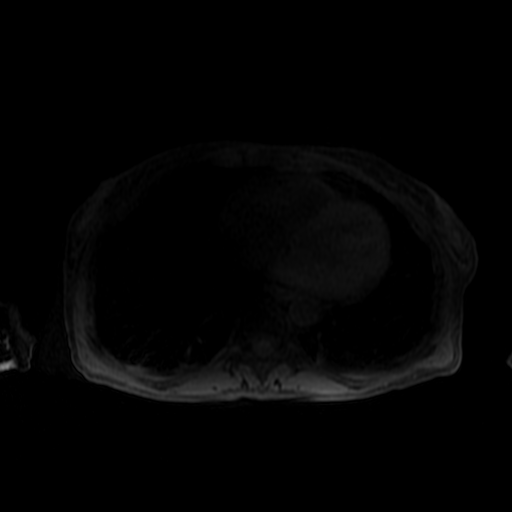

[Series 1001: T1 dynamic post-contrast · axial · non-contrast · 4.0mm · 0.70mm/px · z∈[-80,+174]mm · 3 of 128 slices shown (2 of 4)]
[im 1/128]
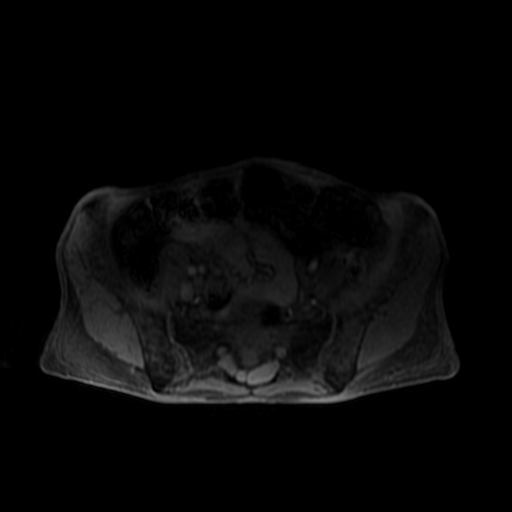
[im 64/128]
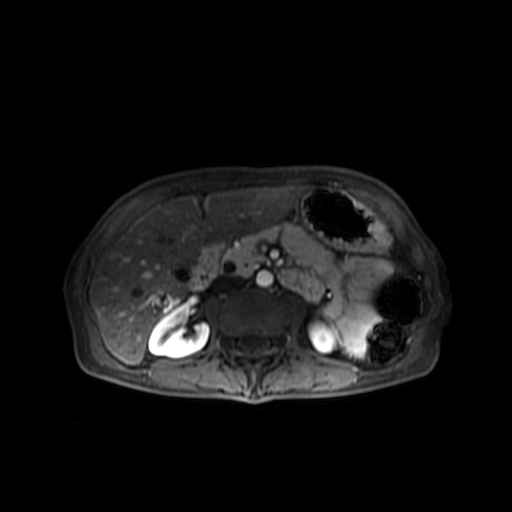
[im 128/128]
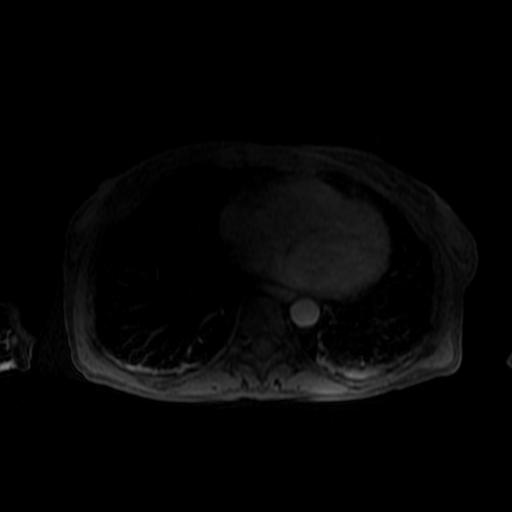

[Series 1002: T1 dynamic post-contrast · axial · non-contrast · 4.0mm · 0.70mm/px · z∈[-80,+174]mm · 3 of 128 slices shown (3 of 4)]
[im 1/128]
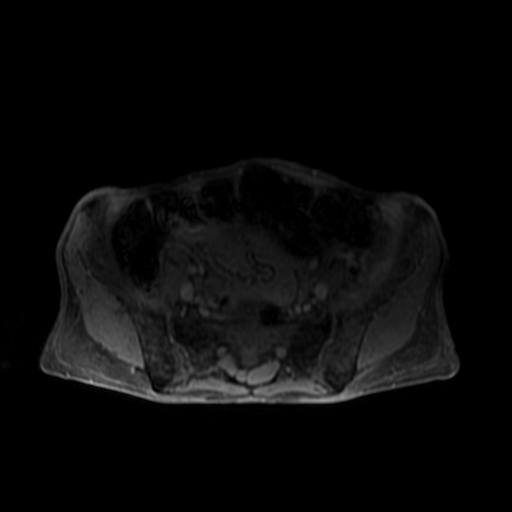
[im 64/128]
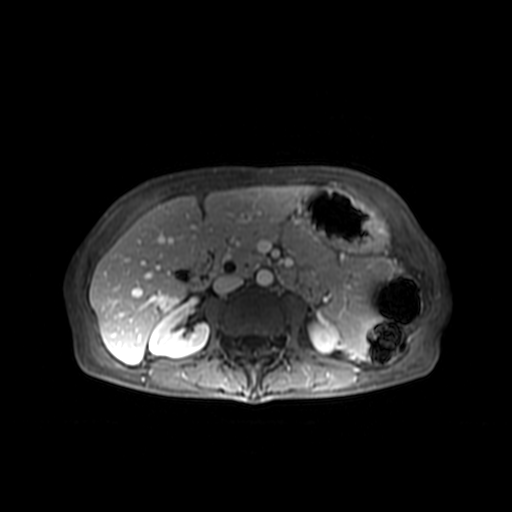
[im 128/128]
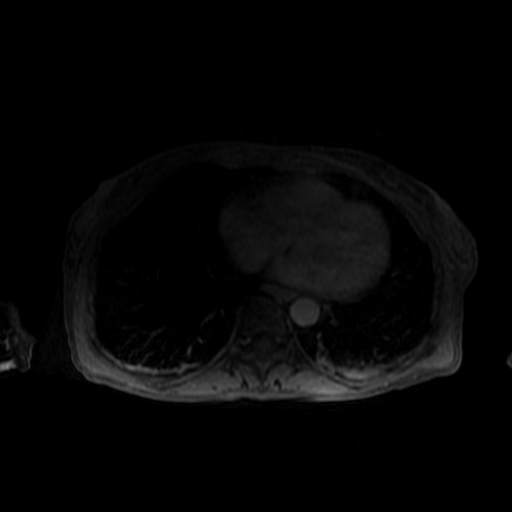

[Series 1003: T1 dynamic post-contrast · axial · non-contrast · 4.0mm · 0.70mm/px · z∈[-80,+46]mm · 2 of 128 slices shown (4 of 4)]
[im 1/128]
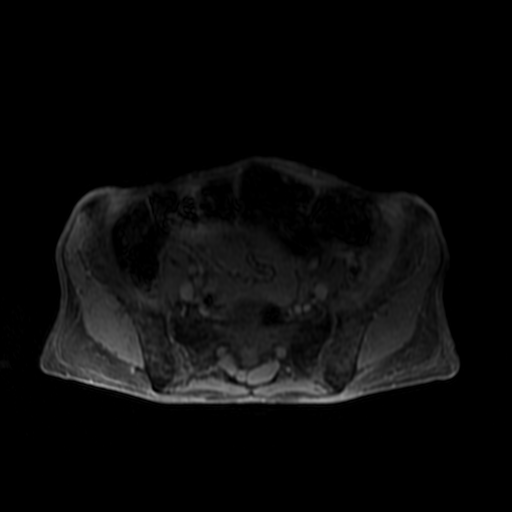
[im 64/128]
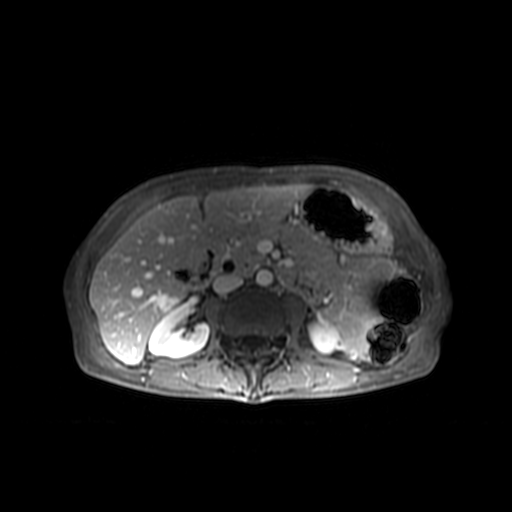

[23 of 48 positions shown; findings below may reference images not displayed]

FINDINGS: Lower chest: No acute findings.

Hepatobiliary: Image degradation by motion artifact noted. A benign
hemangioma measuring 2 cm is seen in the posterior right hepatic
lobe, which corresponds with the lesion seen on recent CT. In
addition, there is a 1.3 cm benign hemangioma in the caudate lobe,
and a 5 mm hemangioma in the posterior right hepatic lobe. A sub-cm
focus of arterial phase hyperenhancement is also seen in the right
hepatic lobe on image 25/[0L], which is not seen on portal venous or
delayed phases or any other sequence including diffusion imaging.
This is most consistent with a benign vascular shunt. Prior
cholecystectomy. No evidence of biliary obstruction.

Pancreas:  No mass or inflammatory changes.

Spleen:  Within normal limits in size and appearance.

Adrenals/Urinary Tract: No masses identified. A few tiny sub-cm
renal cysts are noted. No evidence of hydronephrosis.

Stomach/Bowel: Visualized portion unremarkable.

Vascular/Lymphatic: No pathologically enlarged lymph nodes
identified. No acute vascular findings.

Other:  None.

Musculoskeletal:  No suspicious bone lesions identified.
IMPRESSION: Several small benign hepatic hemangiomas, largest measuring 2 cm and
corresponding with the lesion seen on recent CT.

## 2021-09-17 IMAGING — MR MR LUMBAR SPINE WO/W CM
4 of 8 series · 18 of 48 positions shown · IV contrast (Yes GAD)
Comparison: [DATE]

CLINICAL DATA: Lumbar spine lesion on prior CT imaging

EXAM:
MRI LUMBAR SPINE WITHOUT AND WITH CONTRAST
TECHNIQUE: Multiplanar and multiecho pulse sequences of the lumbar spine were
obtained without and with intravenous contrast.
CONTRAST:  4mL GADAVIST GADOBUTROL 1 MMOL/ML IV SOLN

[Series 3: T2 · sagittal · 3.5mm · 0.51mm/px · 4 of 15 slices shown (1 of 2)]
[im 1/15]
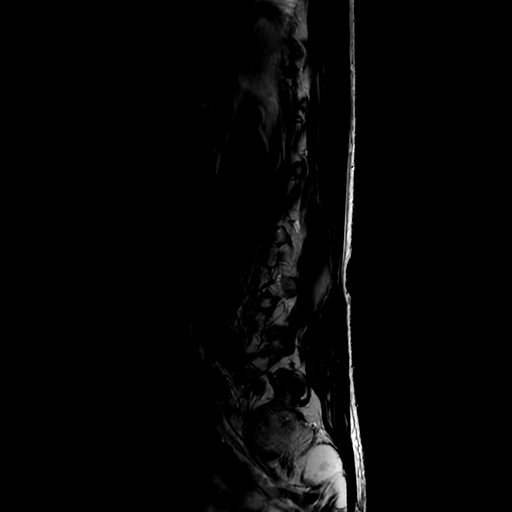
[im 5/15]
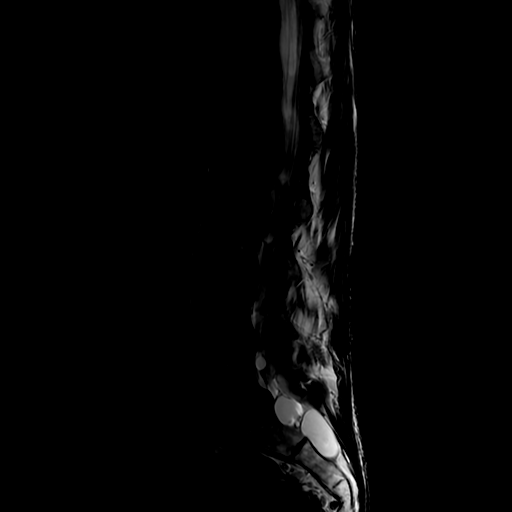
[im 10/15]
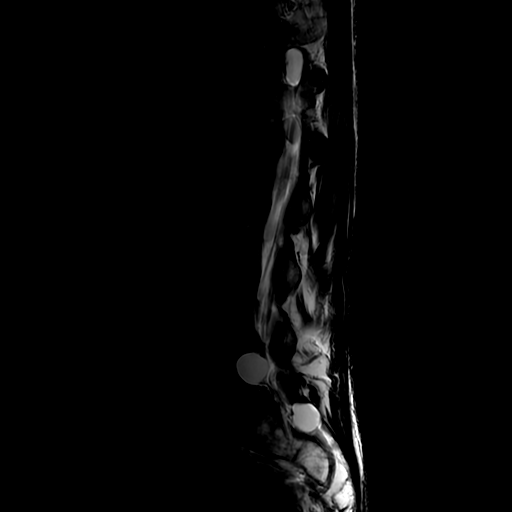
[im 15/15]
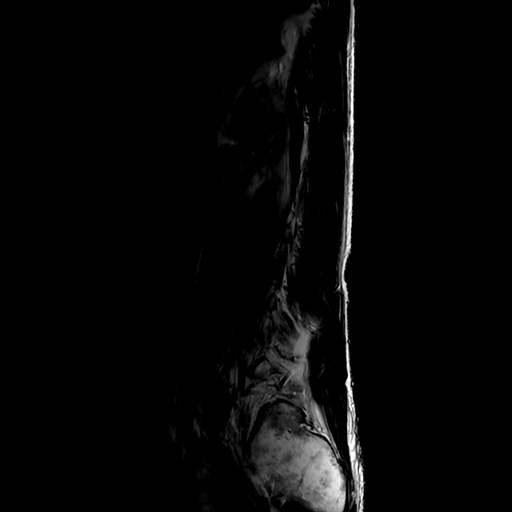

[Series 5: T1 · sagittal · 3.5mm · 0.51mm/px · 3 of 15 slices shown (1 of 2)]
[im 1/15]
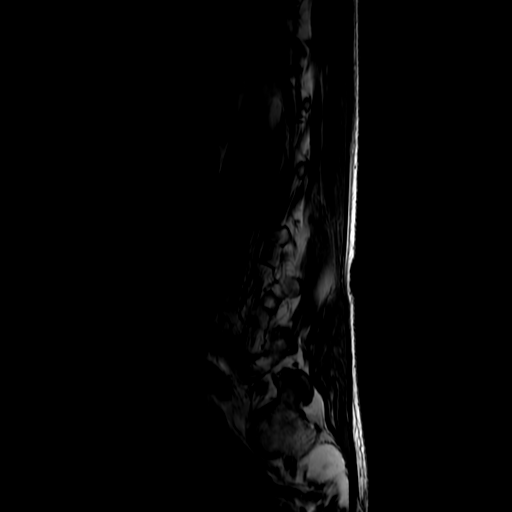
[im 10/15]
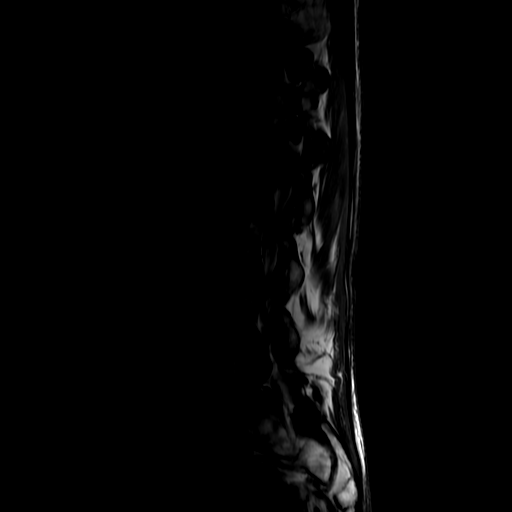
[im 15/15]
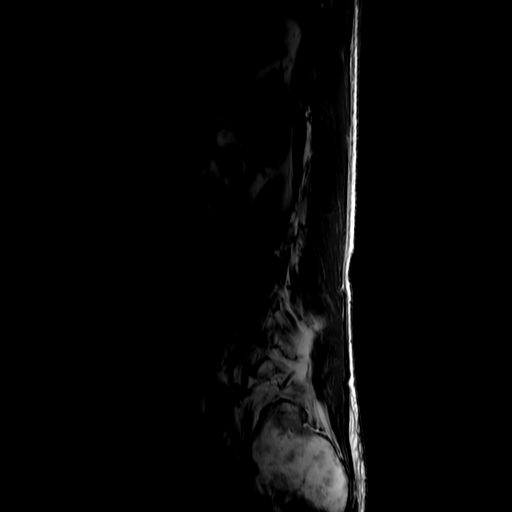

[Series 6: T2 · axial · 3.5mm · 0.35mm/px · z∈[-91,+66]mm · 8 of 36 slices shown (2 of 2)]
[im 1/36]
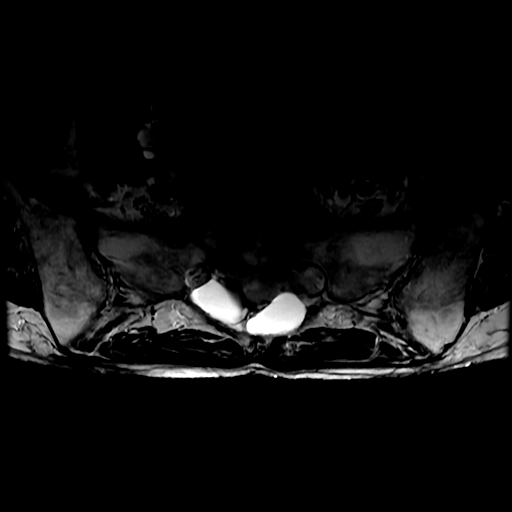
[im 5/36]
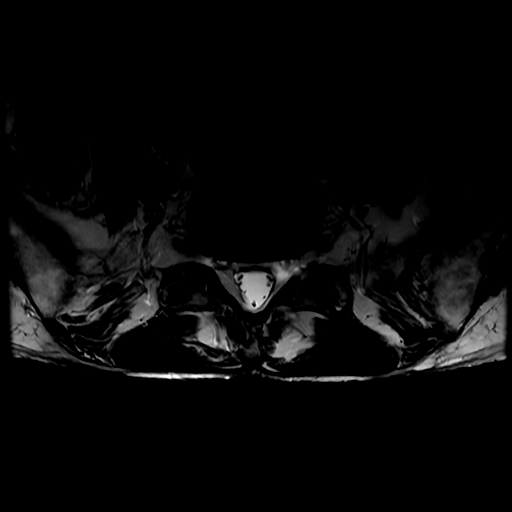
[im 9/36]
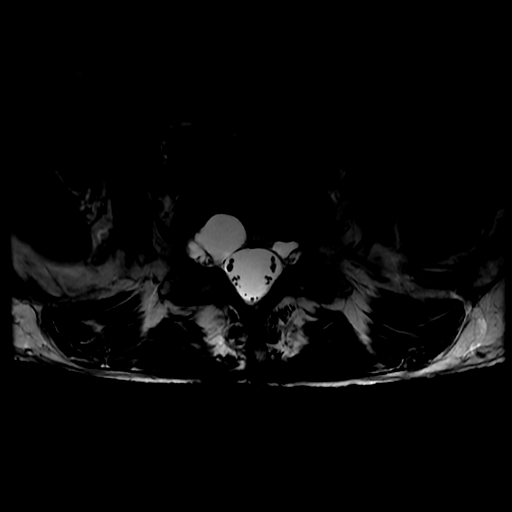
[im 14/36]
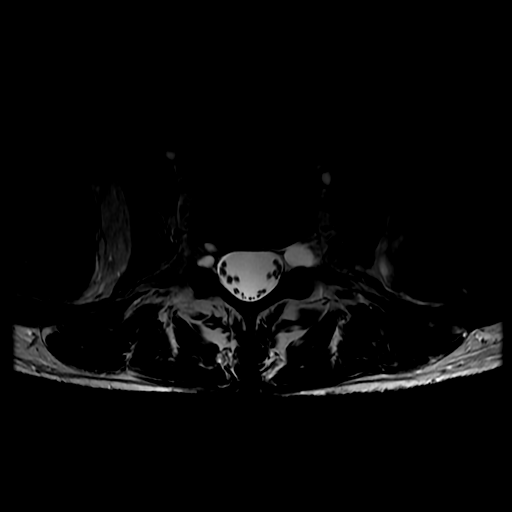
[im 18/36]
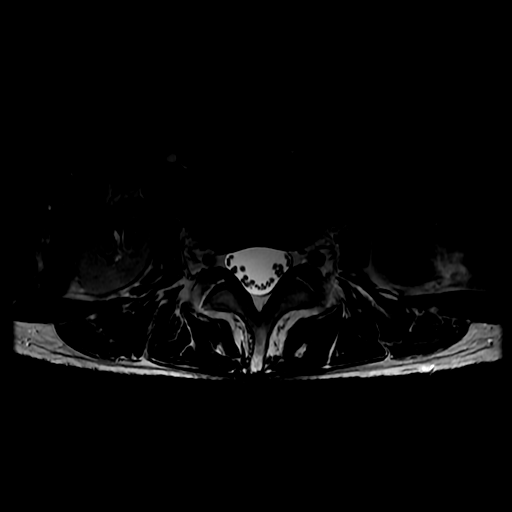
[im 22/36]
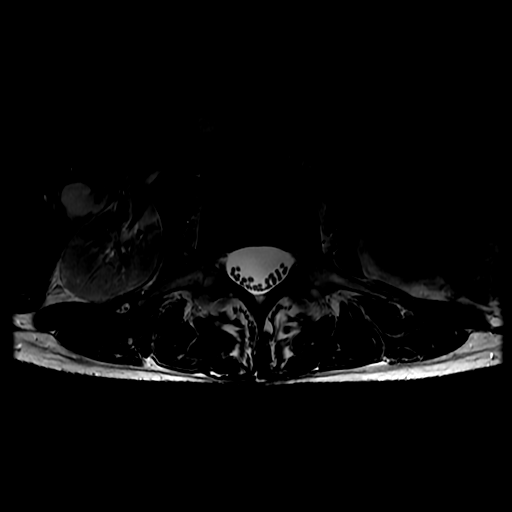
[im 27/36]
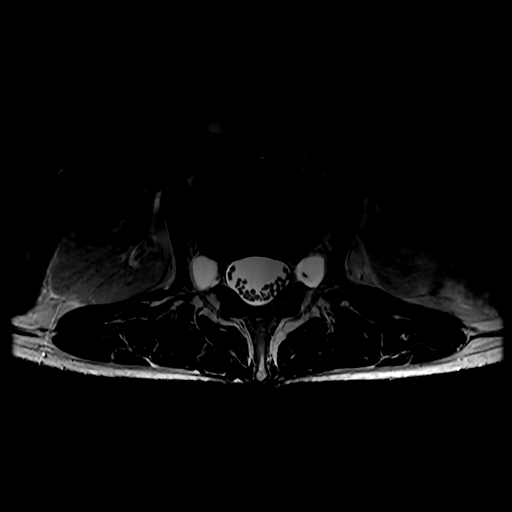
[im 31/36]
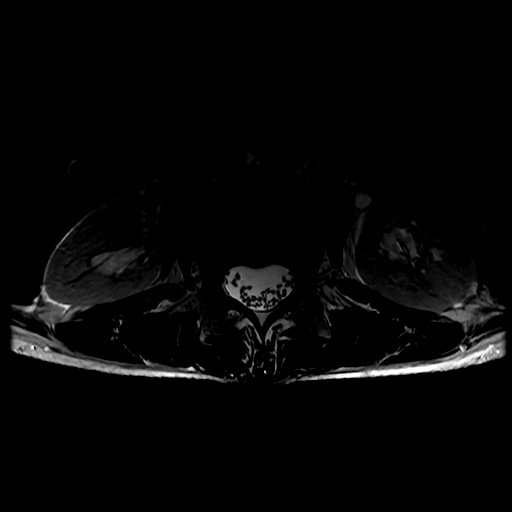

[Series 7: T1 · axial · 3.5mm · 0.35mm/px · z∈[-73,+66]mm · 3 of 36 slices shown (2 of 2)]
[im 5/36]
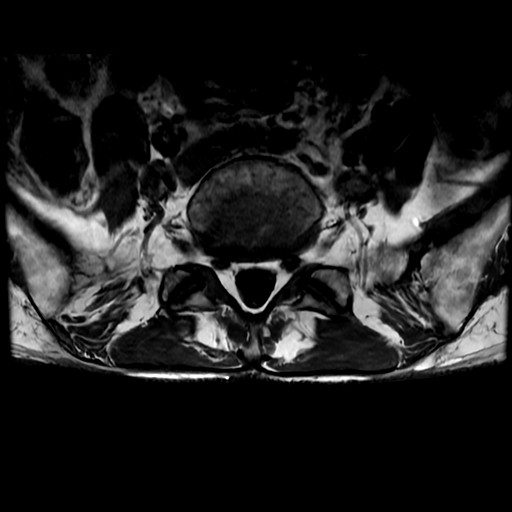
[im 18/36]
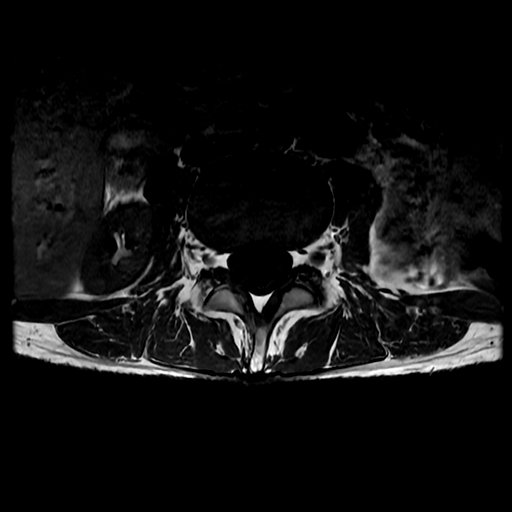
[im 31/36]
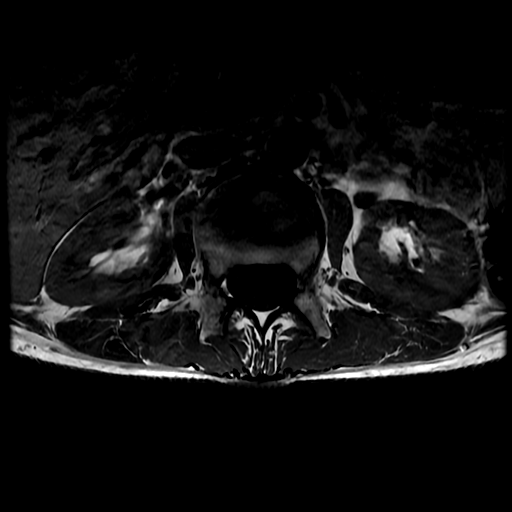

[18 of 48 positions shown; findings below may reference images not displayed]

FINDINGS: Segmentation: The lowest lumbar type non-rib-bearing vertebra is
labeled as L5.

Alignment:  No vertebral subluxation is observed.

Vertebrae: Remote 50% superior endplate compression fracture at L2
without associated marrow edema or enhancement to indicate subacute
chronicity.

Scalloping of the right posterior L5 vertebral body by a Tarlov
cyst/perineural cyst corresponding to the findings at CT.

Borderline expanded AP diameter of the thecal sac in the lumbar
spine, but no scalloping of the posterior vertebral bodies to
further suggest dural ectasia.

Conus medullaris and cauda equina: Conus extends to the L1 level.
Conus and cauda equina appear normal.

Paraspinal and other soft tissues: Right hepatic lobe hemangioma on
image 13 series [DATE] cm cystic lesion in the uncinate process of
the pancreas on image 20 series 6.

Disc levels:

T12-L1: No impingement. Bilateral perineural cysts in the neural
foraminal.

L1-2: No impingement. Mild disc bulge. Right greater than left
perineural cysts in the neural foraminal. On the right side the
perineural cyst measures about 1.4 by 1.2 cm and extends into the
lateral extraforaminal space.

L2-3: No impingement.  Bilateral perineural cysts.

L3-4: No impingement.  Small bilateral perineural cysts.

L4-5: No impingement. Small right and moderate-sized left perineural
cysts. Mild disc bulge.

L5-S1: No definite impingement. Mild disc bulge. Large right and
small left perineural cysts with scalloping of the right posterior
vertebral body and likely some scalloping of the right medial
pedicle.

Sacrum: Tarlov cysts present bilaterally at the S1 level.
IMPRESSION: 1. Multilevel perineural cysts/Tarlov cysts at the visualized
thoracic and lumbar spine levels as well as in the upper sacrum.
These rarely contribute to symptoms. Scalloping of the right
posterior vertebral body and right medial pedicle due to the larger
right perineural cyst at L5-S1.
2. Mild degenerative disc disease at several levels.
3. Remote 50% superior endplate compression fracture at L2.
4. Right hepatic lobe hemangioma observed.
5. 5 mm cystic lesion in the uncinate process of the pancreas.
Possibilities include postinflammatory cystic lesion and small
intraductal papillary mucinous neoplasm. Follow up pancreatic
protocol MRI is recommended in 1 years time. This recommendation
follows ACR consensus guidelines: Management of Incidental
Pancreatic Cysts: A White Paper of the ACR Incidental Findings
Committee. [HOSPITAL] [8Q];[DATE].

## 2021-09-17 SURGERY — MRI WITH ANESTHESIA
Anesthesia: General

## 2021-09-17 MED ORDER — PROPOFOL 10 MG/ML IV BOLUS
INTRAVENOUS | Status: DC | PRN
  Administered 2021-09-17: 80 mg via INTRAVENOUS

## 2021-09-17 MED ORDER — ORAL CARE MOUTH RINSE: 15.0000 mL | Freq: Once | OROMUCOSAL | Status: AC

## 2021-09-17 MED ORDER — ONDANSETRON HCL 4 MG/2ML IJ SOLN
INTRAMUSCULAR | Status: DC | PRN
  Administered 2021-09-17: 4 mg via INTRAVENOUS

## 2021-09-17 MED ORDER — LACTATED RINGERS IV SOLN: INTRAVENOUS | Status: DC

## 2021-09-17 MED ORDER — DEXAMETHASONE SODIUM PHOSPHATE 10 MG/ML IJ SOLN
INTRAMUSCULAR | Status: DC | PRN
  Administered 2021-09-17: 10 mg via INTRAVENOUS

## 2021-09-17 MED ORDER — SUGAMMADEX SODIUM 200 MG/2ML IV SOLN
INTRAVENOUS | Status: DC | PRN
  Administered 2021-09-17: 150 mg via INTRAVENOUS

## 2021-09-17 MED ORDER — GADOBUTROL 1 MMOL/ML IV SOLN
4.0000 mL | Freq: Once | INTRAVENOUS | Status: AC | PRN
  Administered 2021-09-17: 4 mL via INTRAVENOUS

## 2021-09-17 MED ORDER — PROMETHAZINE HCL 25 MG/ML IJ SOLN: 6.2500 mg | INTRAMUSCULAR | Status: DC | PRN

## 2021-09-17 MED ORDER — GLYCOPYRROLATE PF 0.2 MG/ML IJ SOSY
PREFILLED_SYRINGE | INTRAMUSCULAR | Status: DC | PRN
  Administered 2021-09-17: .2 mg via INTRAVENOUS

## 2021-09-17 MED ORDER — CHLORHEXIDINE GLUCONATE 0.12 % MT SOLN
15.0000 mL | Freq: Once | OROMUCOSAL | Status: AC
  Administered 2021-09-17: 15 mL via OROMUCOSAL

## 2021-09-17 MED ORDER — MIDAZOLAM HCL 2 MG/2ML IJ SOLN
INTRAMUSCULAR | Status: DC | PRN
  Administered 2021-09-17 (×2): 1 mg via INTRAVENOUS

## 2021-09-17 MED ORDER — ROCURONIUM BROMIDE 10 MG/ML (PF) SYRINGE
PREFILLED_SYRINGE | INTRAVENOUS | Status: DC | PRN
  Administered 2021-09-17: 40 mg via INTRAVENOUS

## 2021-09-17 MED ORDER — PHENYLEPHRINE 40 MCG/ML (10ML) SYRINGE FOR IV PUSH (FOR BLOOD PRESSURE SUPPORT)
PREFILLED_SYRINGE | INTRAVENOUS | Status: DC | PRN
  Administered 2021-09-17 (×3): 40 ug via INTRAVENOUS
  Administered 2021-09-17 (×2): 80 ug via INTRAVENOUS
  Administered 2021-09-17 (×3): 40 ug via INTRAVENOUS

## 2021-09-17 MED ORDER — FENTANYL CITRATE (PF) 250 MCG/5ML IJ SOLN
INTRAMUSCULAR | Status: DC | PRN
  Administered 2021-09-17: 100 ug via INTRAVENOUS

## 2021-09-17 NOTE — Transfer of Care (Signed)
Immediate Anesthesia Transfer of Care Note  Patient: Aerika Groll  Procedure(s) Performed: MRI LUMBAR WITH AND WITHOUT CONTRAST; MRI ABDOMEN WITH AND WITHOUT WITH ANESTHESIA  Patient Location: PACU  Anesthesia Type:General  Level of Consciousness: awake and drowsy  Airway & Oxygen Therapy: Patient Spontanous Breathing  Post-op Assessment: Report given to RN and Post -op Vital signs reviewed and stable  Post vital signs: Reviewed and stable  Last Vitals:  Vitals Value Taken Time  BP 100/59 09/17/21 1010  Temp    Pulse 64 09/17/21 1010  Resp 13 09/17/21 1010  SpO2 92 % 09/17/21 1010  Vitals shown include unvalidated device data.  Last Pain:  Vitals:   09/17/21 0625  TempSrc:   PainSc: 0-No pain         Complications: No notable events documented.

## 2021-09-17 NOTE — Anesthesia Postprocedure Evaluation (Signed)
Anesthesia Post Note  Patient: Carla Martinez  Procedure(s) Performed: MRI LUMBAR WITH AND WITHOUT CONTRAST; MRI ABDOMEN WITH AND WITHOUT WITH ANESTHESIA     Patient location during evaluation: PACU Anesthesia Type: General Level of consciousness: sedated and patient cooperative Pain management: pain level controlled Vital Signs Assessment: post-procedure vital signs reviewed and stable Respiratory status: spontaneous breathing Cardiovascular status: stable Anesthetic complications: no   No notable events documented.  Last Vitals:  Vitals:   09/17/21 1025 09/17/21 1040  BP: 105/61 109/62  Pulse: 62 60  Resp: 12 16  Temp:  37.1 C  SpO2: 99% 94%    Last Pain:  Vitals:   09/17/21 1040  TempSrc:   PainSc: 0-No pain                 Nolon Nations

## 2021-09-17 NOTE — Telephone Encounter (Signed)
Please notify patient that we do not have her MRI results and we will be in touch once I have a chance to review them.  Also, please remind her that she needs to find a new psychiatrist as Dr. Shea Evans is no longer seeing her.  She should call their office to find out who Dr. Shea Evans referred her to.

## 2021-09-17 NOTE — Telephone Encounter (Addendum)
I spoke with pt; pt said the MRI was scheduled for today so pt went ahead since she did not get cb yesterday afternoon and had MRI of abd and LS spine done today which is going to cost pt out of pocket $2200.00. pt request cb when gets results from imaging and pt wants to know what else needs to be done because pts insurance ends on 09/20/21. Pt said reason for testing was that pt was extremely bloated and looked 6 months pregnant and pt had lower back pain as well. Pt said she is not having pain now and when I asked her when her pain stopped pt said that she does not remember and that she is still under effects of med given prior to MRI; pt is very droggy. Pt could not answer any more questions now. Sending note to Gentry Fitz NP and [facility] CMA. Will also teams Joellen because pt wants cb ASAP. Also at 08/15/21 Dr Einar Pheasant noted for pt in about 6 wks around 09/26/21 for pt to have a virtual visit with Gentry Fitz NP? Is that something that should or should not be scheduled. Thank you.

## 2021-09-17 NOTE — Interval H&P Note (Signed)
Anesthesia H&P Update: History and Physical Exam reviewed; patient is OK for planned anesthetic and procedure. ? ?

## 2021-09-18 ENCOUNTER — Other Ambulatory Visit: Payer: Self-pay

## 2021-09-18 ENCOUNTER — Encounter (HOSPITAL_COMMUNITY): Payer: Self-pay | Admitting: Radiology

## 2021-09-18 MED ORDER — PANTOPRAZOLE SODIUM 40 MG PO TBEC: 40.0000 mg | DELAYED_RELEASE_TABLET | Freq: Every day | ORAL | 0 refills | Status: DC

## 2021-09-18 MED ORDER — METOPROLOL SUCCINATE ER 25 MG PO TB24: 12.5000 mg | ORAL_TABLET | Freq: Every day | ORAL | 0 refills | Status: DC

## 2021-09-18 NOTE — Telephone Encounter (Signed)
Called patient let know we will call once MRI received. When reminded her about referral from Dr. Shea Evans patient states that she just found out that her insurance does not cover any mental health. She is going to call around and check on that and will let us know what she decides.

## 2021-09-26 ENCOUNTER — Telehealth: Payer: Self-pay

## 2021-09-26 NOTE — Telephone Encounter (Signed)
-----   Message from Lin Landsman, MD sent at 09/24/2021  9:24 AM EST ----- Regarding: Procedures Hope  Please call patient and inform her about the planned upper endoscopy and colonoscopy that we need to schedule.  I reviewed her repeat echocardiogram which came back normal.  This is a big improvement compared to previous echo when she was hospitalized with cardiomyopathy.  If patient is agreeable, please schedule EGD and colonoscopy.  If she would like to see me in the office before scheduling the procedures, happy to see her again  Dx: Enteritis, abnormal CT  Thanks RV

## 2021-09-26 NOTE — Telephone Encounter (Signed)
CALLED PATIENT NO ANSWER LEFT VOICEMAIL FOR A CALL BACK ? ?

## 2021-09-26 NOTE — Telephone Encounter (Signed)
Patient was returning your phone call.

## 2021-09-27 ENCOUNTER — Telehealth: Payer: Self-pay

## 2021-09-27 NOTE — Telephone Encounter (Signed)
CALLED PATIENT NO ANSWER LEFT VOICEMAIL FOR A CALL BACK ? ?

## 2021-09-27 NOTE — Telephone Encounter (Signed)
Patient called me back she is in pain gasy constipated bloated swelling I sent message to drvanga  she wanted me to put samples of linzess 290 up front so I did I let patient know they are there for her to pick up from office during hours also patient has no insurance right now so wants to wait on any other procedures will call us when she gets new insurance

## 2021-09-30 ENCOUNTER — Telehealth: Payer: Self-pay | Admitting: Family Medicine

## 2021-09-30 NOTE — Telephone Encounter (Signed)
Pt called to follow up on paperwork that was dropped off last week and wanted to know if it could just be faxed to the number on the form. Also she wanted to know if she needed to follow up with someone else while Dr. Einar Pheasant is out

## 2021-10-01 NOTE — Telephone Encounter (Signed)
Patient came in to pick-up paperwork ([place]) and made notations that it was not complete. Please review and contact the patient [phone]  Patient states the forms are due 14 days from the receipt of the notification

## 2021-10-02 ENCOUNTER — Ambulatory Visit: Payer: No Typology Code available for payment source | Admitting: Licensed Clinical Social Worker

## 2021-10-03 ENCOUNTER — Ambulatory Visit: Payer: No Typology Code available for payment source | Admitting: Psychiatry

## 2021-10-03 NOTE — Telephone Encounter (Signed)
Mychart to patient.   [name],    I have filled out the portion of the paperwork that pertains to [place] and Dr. Einar Pheasant, but you would need to fill out the rest, due to [place] not having that information for the last 5 years.  Sorry for the inconvenience. Your paperwork packet will be at the front desk for pickup.    Thanks, [employer]

## 2021-10-04 ENCOUNTER — Other Ambulatory Visit: Payer: Self-pay | Admitting: Primary Care

## 2021-10-06 NOTE — Telephone Encounter (Signed)
Please call patient. Did she get established with psychiatry yet? If so then refill requests should go to her psychiatrist.

## 2021-10-08 NOTE — Telephone Encounter (Signed)
On Nov. 21 pt stated this "I just found out that my insurance does not cover mental health so I'm not going to be able to see these doctors.. Thanks for your help". Then I believe pt lost all insurance on 09/23/21.

## 2021-10-09 NOTE — Telephone Encounter (Signed)
Will defer to Dr. Einar Pheasant upon her return. Refill(s) sent to pharmacy.

## 2021-10-14 ENCOUNTER — Other Ambulatory Visit: Payer: Self-pay | Admitting: Family Medicine

## 2021-10-14 DIAGNOSIS — F5000 Anorexia nervosa, unspecified: Secondary | ICD-10-CM

## 2021-10-14 DIAGNOSIS — F330 Major depressive disorder, recurrent, mild: Secondary | ICD-10-CM

## 2021-10-17 ENCOUNTER — Other Ambulatory Visit: Payer: Self-pay | Admitting: Family Medicine

## 2021-11-05 ENCOUNTER — Other Ambulatory Visit: Payer: Self-pay | Admitting: Family Medicine

## 2021-11-05 ENCOUNTER — Other Ambulatory Visit: Payer: Self-pay | Admitting: Primary Care

## 2021-11-05 DIAGNOSIS — F5000 Anorexia nervosa, unspecified: Secondary | ICD-10-CM

## 2021-11-05 DIAGNOSIS — F330 Major depressive disorder, recurrent, mild: Secondary | ICD-10-CM

## 2021-11-06 ENCOUNTER — Other Ambulatory Visit: Payer: Self-pay

## 2021-11-06 ENCOUNTER — Telehealth: Payer: Self-pay

## 2021-11-06 MED ORDER — LINACLOTIDE 290 MCG PO CAPS: 290.0000 ug | ORAL_CAPSULE | Freq: Every day | ORAL | 3 refills | Status: DC

## 2021-11-06 NOTE — Telephone Encounter (Signed)
Need to clarify which dose of Trazodone she is taking as there are now two doses on her list. 100 mg and 150 mg. Which dose? How many tabs? 1 or 2? Last fill was for the 150 mg dose, 2 tabs but the 100 mg dose was not there last time.

## 2021-11-06 NOTE — Telephone Encounter (Signed)
Pt called today requesting a prescription for Linzess 263mcg. Pt stated the samples Dr Marius Ditch gave her were working. Not perfect but enough to help her constipation. Advised pt I can send enough for a couple of months until her insurance goes into effect, but she will need to call asap when she gets her insurance info as Dr Marius Ditch will not refill until she is seen in the office. Pt verbalized understanding. Prescription sent. [place]

## 2021-11-06 NOTE — Telephone Encounter (Signed)
Patient called about her medicine being 541.00 Because her insurance hasnt kicked in yet asked for a few more samples till insurance kicked in so I put her linzess samples up front

## 2021-11-07 NOTE — Telephone Encounter (Signed)
Mychart sent to pt.

## 2021-11-08 NOTE — Telephone Encounter (Signed)
Pt states that she is going out of town tomorrow so she really needs these meds filled today.

## 2021-11-08 NOTE — Telephone Encounter (Signed)
Noted, removed trazodone 100 mg tablets from medication list. Refill sent to pharmacy.

## 2021-11-08 NOTE — Telephone Encounter (Signed)
Per pt "I take two 150 mg every night" of the trazodone.

## 2021-11-11 ENCOUNTER — Telehealth: Payer: Self-pay

## 2021-11-11 NOTE — Telephone Encounter (Signed)
Sent PA through Cover my meds for the Linzess. Scanned patient insurance card in chart

## 2021-11-11 NOTE — Telephone Encounter (Signed)
Linzess 213mcg is not on patient formula. [employer] and they said I had to call [employer] directly at [phone] or do form on website. Submitted form on website and fax to them

## 2021-11-19 ENCOUNTER — Other Ambulatory Visit: Payer: Self-pay | Admitting: Family Medicine

## 2021-11-19 ENCOUNTER — Other Ambulatory Visit: Payer: Self-pay | Admitting: Gastroenterology

## 2021-11-19 DIAGNOSIS — F5000 Anorexia nervosa, unspecified: Secondary | ICD-10-CM

## 2021-11-19 DIAGNOSIS — F330 Major depressive disorder, recurrent, mild: Secondary | ICD-10-CM

## 2021-11-20 ENCOUNTER — Telehealth: Payer: Self-pay

## 2021-11-20 DIAGNOSIS — K581 Irritable bowel syndrome with constipation: Secondary | ICD-10-CM

## 2021-11-20 NOTE — Telephone Encounter (Signed)
Patient states that she got a letter stating that the Linzess 290 mcg was denied and she needs [place] to do the appeal on the medication. We have not received the denial and she states she threw away the letter. Will call insurance

## 2021-11-21 ENCOUNTER — Other Ambulatory Visit: Payer: Self-pay | Admitting: Family Medicine

## 2021-11-21 ENCOUNTER — Encounter: Payer: Self-pay | Admitting: Gastroenterology

## 2021-11-21 NOTE — Telephone Encounter (Signed)
[facility] states I have to call directly to [employer] and there number is [phone].[employer] Appeal department  phone number is [phone] and [employer] is transferring me to them.  Insurance denied the Linzess 226mcg. We can do appeal by letter   Fax appeal [phone]

## 2021-11-21 NOTE — Telephone Encounter (Signed)
Appeal letter is done   RV

## 2021-11-22 NOTE — Telephone Encounter (Signed)
Faxed letter to appeal department. Informed patient by [employer]

## 2021-11-28 ENCOUNTER — Ambulatory Visit: Payer: No Typology Code available for payment source | Admitting: Family Medicine

## 2021-12-02 ENCOUNTER — Ambulatory Visit: Payer: Self-pay | Admitting: Family Medicine

## 2021-12-02 ENCOUNTER — Other Ambulatory Visit: Payer: Self-pay | Admitting: Family Medicine

## 2021-12-02 DIAGNOSIS — F3340 Major depressive disorder, recurrent, in remission, unspecified: Secondary | ICD-10-CM

## 2021-12-04 ENCOUNTER — Telehealth: Payer: Self-pay

## 2021-12-04 ENCOUNTER — Encounter: Payer: Self-pay | Admitting: Gastroenterology

## 2021-12-04 ENCOUNTER — Other Ambulatory Visit: Payer: Self-pay | Admitting: Primary Care

## 2021-12-04 ENCOUNTER — Other Ambulatory Visit: Payer: Self-pay

## 2021-12-04 ENCOUNTER — Ambulatory Visit: Payer: Self-pay | Admitting: Family Medicine

## 2021-12-04 ENCOUNTER — Ambulatory Visit (INDEPENDENT_AMBULATORY_CARE_PROVIDER_SITE_OTHER): Payer: Self-pay | Admitting: Gastroenterology

## 2021-12-04 ENCOUNTER — Other Ambulatory Visit: Payer: Self-pay | Admitting: Family Medicine

## 2021-12-04 VITALS — BP 95/67 | HR 60 | Temp 97.7°F | Ht 63.0 in | Wt 95.2 lb

## 2021-12-04 DIAGNOSIS — Z91199 Patient's noncompliance with other medical treatment and regimen due to unspecified reason: Secondary | ICD-10-CM

## 2021-12-04 DIAGNOSIS — F3340 Major depressive disorder, recurrent, in remission, unspecified: Secondary | ICD-10-CM

## 2021-12-04 NOTE — Progress Notes (Signed)
Patient left without being seen  RV

## 2021-12-04 NOTE — Telephone Encounter (Signed)
Patient check in to her appointment at 1:45pm at 1:19pm. Patient states she has to leave by 2:15pm. Informed patient that there were 2 appointments before her appointment. At 2:10pm patient came to front desk and stated she had to leave and wants [place] to call her to reschedule her appointment. Patient wants to know if she can make a mychart visit since she had to wait for appointment and never got seen. Please advise

## 2021-12-04 NOTE — Telephone Encounter (Signed)
Yes, please schedule MyChart visit, okay to [employer]

## 2021-12-04 NOTE — Telephone Encounter (Signed)
Schedule patient for 12/18/21 at 12:45

## 2021-12-05 NOTE — Telephone Encounter (Signed)
Mychart sent to pt relaying Dr. Verda Cumins message.

## 2021-12-05 NOTE — Telephone Encounter (Signed)
Refill of trazodone declined.  Patient with QT prolongation.  Was advised most recently in our last visit to only take 100 mg of trazodone daily.  She told another provider that she was taking 300 mg at night this was a previous dose to her QT prolongation.  She needs an EKG prior to additional trazodone being prescribed.  Ultimately she needs to see psych for continued management of her chronic psychiatric medications.  Please advise patient no refills until she comes to the office.  Please advise patient to reach out to [facility] and that no medications will be able to be prescribed until I received updated medication lists.   Phone number Call [phone]   24/7 Walk-in available - she needs to establish with psych.   [place], [place] [address]

## 2021-12-07 ENCOUNTER — Other Ambulatory Visit: Payer: Self-pay | Admitting: Family Medicine

## 2021-12-07 ENCOUNTER — Other Ambulatory Visit: Payer: Self-pay | Admitting: Primary Care

## 2021-12-07 DIAGNOSIS — F3340 Major depressive disorder, recurrent, in remission, unspecified: Secondary | ICD-10-CM

## 2021-12-09 ENCOUNTER — Other Ambulatory Visit: Payer: Self-pay | Admitting: Family Medicine

## 2021-12-09 ENCOUNTER — Other Ambulatory Visit: Payer: Self-pay | Admitting: Primary Care

## 2021-12-09 DIAGNOSIS — F3340 Major depressive disorder, recurrent, in remission, unspecified: Secondary | ICD-10-CM

## 2021-12-10 NOTE — Telephone Encounter (Signed)
Call patient - and update with response  Without appropriate medication reconciliation I will be unable to fill any psych medications.   I prefer that she be seen here:   [facility]  Phone number Call [phone]   24/7 Walk-in available   [address] [place], [place] [address]

## 2021-12-11 ENCOUNTER — Ambulatory Visit (INDEPENDENT_AMBULATORY_CARE_PROVIDER_SITE_OTHER): Payer: 59 | Admitting: Family Medicine

## 2021-12-11 ENCOUNTER — Other Ambulatory Visit: Payer: Self-pay

## 2021-12-11 VITALS — BP 82/60 | HR 104 | Temp 98.7°F | Wt 96.1 lb

## 2021-12-11 DIAGNOSIS — I5023 Acute on chronic systolic (congestive) heart failure: Secondary | ICD-10-CM

## 2021-12-11 DIAGNOSIS — F411 Generalized anxiety disorder: Secondary | ICD-10-CM

## 2021-12-11 DIAGNOSIS — Z9189 Other specified personal risk factors, not elsewhere classified: Secondary | ICD-10-CM

## 2021-12-11 DIAGNOSIS — E876 Hypokalemia: Secondary | ICD-10-CM | POA: Diagnosis not present

## 2021-12-11 DIAGNOSIS — F419 Anxiety disorder, unspecified: Secondary | ICD-10-CM

## 2021-12-11 DIAGNOSIS — G4700 Insomnia, unspecified: Secondary | ICD-10-CM | POA: Diagnosis not present

## 2021-12-11 DIAGNOSIS — F3340 Major depressive disorder, recurrent, in remission, unspecified: Secondary | ICD-10-CM | POA: Diagnosis not present

## 2021-12-11 DIAGNOSIS — F329 Major depressive disorder, single episode, unspecified: Secondary | ICD-10-CM | POA: Diagnosis not present

## 2021-12-11 DIAGNOSIS — R197 Diarrhea, unspecified: Secondary | ICD-10-CM

## 2021-12-11 LAB — BASIC METABOLIC PANEL
BUN: 31 mg/dL — ABNORMAL HIGH (ref 6–23)
CO2: 29 mEq/L (ref 19–32)
Calcium: 9.4 mg/dL (ref 8.4–10.5)
Chloride: 99 mEq/L (ref 96–112)
Creatinine, Ser: 1.19 mg/dL (ref 0.40–1.20)
GFR: 50.51 mL/min — ABNORMAL LOW (ref >60.00)
Glucose, Bld: 119 mg/dL — ABNORMAL HIGH (ref 70–99)
Potassium: 3.8 mEq/L (ref 3.5–5.1)
Sodium: 135 mEq/L (ref 135–145)

## 2021-12-11 MED ORDER — QUETIAPINE FUMARATE 100 MG PO TABS: 100.0000 mg | ORAL_TABLET | Freq: Three times a day (TID) | ORAL | 1 refills | Status: DC

## 2021-12-11 MED ORDER — TRAZODONE HCL 100 MG PO TABS: 100.0000 mg | ORAL_TABLET | Freq: Every day | ORAL | 1 refills | Status: DC

## 2021-12-11 MED ORDER — METOPROLOL SUCCINATE ER 25 MG PO TB24: 12.5000 mg | ORAL_TABLET | Freq: Every day | ORAL | 3 refills | Status: DC

## 2021-12-11 MED ORDER — GABAPENTIN 100 MG PO CAPS: 100.0000 mg | ORAL_CAPSULE | Freq: Three times a day (TID) | ORAL | 1 refills | Status: DC

## 2021-12-11 NOTE — Telephone Encounter (Signed)
Pt seen in office on 12/11/21

## 2021-12-11 NOTE — Assessment & Plan Note (Signed)
Reviewed care everywhere echo with resolution of heart functioning.  She is overdue for cardiology follow-up.  Continue metoprolol 12.5 mg daily.

## 2021-12-11 NOTE — Assessment & Plan Note (Signed)
She is seeing Dr. Marius Ditch with GI but missed her last appointment.  She notes Linzess is too expensive.  Will route note to Dr. Marius Ditch for update.

## 2021-12-11 NOTE — Progress Notes (Signed)
Subjective:     Carla Martinez is a 61 y.o. female presenting for Medication Refill and Follow-up     Medication Refill   #mood disorder/anxiety - had a psych admission - she reports she was told a different medication - but never got those filled - was taking Seroquel 100 mg TID, trazodone 100 mg daily, amitriptyline 25 mg nightly    Chart review  - Admitted on 07/02/2021 with QT prolongation - trazodone decreased as well as amitriptyline and Seroquel  She later called in while on leave and requested "refills" of Ambien and her previous high dose of trazodone. These were filled by the covering provider.   #CHF - taking metoprolol - no cp - cannot remember when she last saw cardiology though reports recent good report - chart review - repeat echo 08/2021 with normal function   Review of Systems   Social History   Tobacco Use  Smoking Status Every Day   Packs/day: 0.25   Years: 17.00   Pack years: 4.25   Types: Cigarettes   Start date: 11/25/2003  Smokeless Tobacco Never  Tobacco Comments   used to smoke 1PPD but has decreased. Down to 3  cigarettes daily        Objective:    BP Readings from Last 3 Encounters:  12/11/21 (!) 82/60  12/04/21 95/67  09/17/21 109/62   Wt Readings from Last 3 Encounters:  12/11/21 96 lb 1 oz (43.6 kg)  12/04/21 95 lb 4 oz (43.2 kg)  09/17/21 90 lb (40.8 kg)    BP (!) 82/60    Pulse (!) 104    Temp 98.7 F (37.1 C) (Oral)    Wt 96 lb 1 oz (43.6 kg)    SpO2 96%    BMI 17.02 kg/m    Physical Exam Constitutional:      General: She is not in acute distress.    Appearance: She is well-developed. She is not diaphoretic.     Comments: thin  HENT:     Right Ear: External ear normal.     Left Ear: External ear normal.  Eyes:     Conjunctiva/sclera: Conjunctivae normal.  Cardiovascular:     Rate and Rhythm: Normal rate and regular rhythm.     Heart sounds: Murmur heard.  Pulmonary:     Effort: Pulmonary effort is  normal. No respiratory distress.     Breath sounds: Normal breath sounds. No wheezing.  Musculoskeletal:     Cervical back: Neck supple.  Skin:    General: Skin is warm and dry.     Capillary Refill: Capillary refill takes less than 2 seconds.  Neurological:     Mental Status: She is alert. Mental status is at baseline.  Psychiatric:        Mood and Affect: Mood normal.        Behavior: Behavior normal.   EKG: nonspecific T-wave changes. Qtc board-line but not prolonged       Assessment & Plan:   Problem List Items Addressed This Visit       Cardiovascular and Mediastinum   Acute on chronic systolic congestive heart failure ([place]) - Primary    Reviewed care everywhere echo with resolution of heart functioning.  She is overdue for cardiology follow-up.  Continue metoprolol 12.5 mg daily.      Relevant Medications   metoprolol succinate (TOPROL-XL) 25 MG 24 hr tablet   Other Relevant Orders   Basic metabolic panel     Other  Anxiety (Chronic)   Relevant Medications   QUEtiapine (SEROQUEL) 100 MG tablet   gabapentin (NEURONTIN) 100 MG capsule   traZODone (DESYREL) 100 MG tablet   Insomnia (Chronic)    Refill of trazodone 100 mg at bedtime.  EKG reassuring with borderline QTc however not prolonged.  Of note this patient had a covering provider prescribed her original dose of trazodone of 300 mg at night while I was out on leave.  She has been advised on multiple occasions the need for psychiatry at this time I told her I would not prescribe beyond 100 mg and have given her 60-day supply.  She has information for walk-in clinic.  She also had a covering provider prescribe her Ambien which I had never prescribed for her and we had not discussed.  She will not be receiving Ambien from this provider.      Relevant Medications   QUEtiapine (SEROQUEL) 100 MG tablet   gabapentin (NEURONTIN) 100 MG capsule   traZODone (DESYREL) 100 MG tablet   Diarrhea    She is seeing Dr. Marius Ditch  with GI but missed her last appointment.  She notes Linzess is too expensive.  Will route note to Dr. Marius Ditch for update.      Generalized anxiety disorder    Persisting and purulent poorly controlled.  Patient notes she takes gabapentin for this discussed unclear reasoning.  Advised if interested she could taper and monitor for effect.  Refill of Seroquel 100 mg 3 times daily for 60-day supply.  Have told patient's repeatedly that I think she needs to see psychiatry due to poorly controlled anxiety and depression, as well as history of prolonged QTc on multiple psych medications.  She was given information about a walk-in clinic at [facility] and told that I would not continue to prescribe any psychometric medications until she is seen.      Relevant Medications   traZODone (DESYREL) 100 MG tablet   Depression   Relevant Medications   QUEtiapine (SEROQUEL) 100 MG tablet   traZODone (DESYREL) 100 MG tablet   Other Visit Diagnoses     At high risk for adverse medication event       Relevant Orders   EKG 12-Lead (Completed)   Hypokalemia       Relevant Orders   Basic metabolic panel        Return in about 2 months (around 02/08/2022).  [name], MD  This visit occurred during the SARS-CoV-2 public health emergency.  Safety protocols were in place, including screening questions prior to the visit, additional usage of staff PPE, and extensive cleaning of exam room while observing appropriate contact time as indicated for disinfecting solutions.

## 2021-12-11 NOTE — Assessment & Plan Note (Signed)
Refill of trazodone 100 mg at bedtime.  EKG reassuring with borderline QTc however not prolonged.  Of note this patient had a covering provider prescribed her original dose of trazodone of 300 mg at night while I was out on leave.  She has been advised on multiple occasions the need for psychiatry at this time I told her I would not prescribe beyond 100 mg and have given her 60-day supply.  She has information for walk-in clinic.  She also had a covering provider prescribe her Ambien which I had never prescribed for her and we had not discussed.  She will not be receiving Ambien from this provider.

## 2021-12-11 NOTE — Assessment & Plan Note (Signed)
Persisting and purulent poorly controlled.  Patient notes she takes gabapentin for this discussed unclear reasoning.  Advised if interested she could taper and monitor for effect.  Refill of Seroquel 100 mg 3 times daily for 60-day supply.  Have told patient's repeatedly that I think she needs to see psychiatry due to poorly controlled anxiety and depression, as well as history of prolonged QTc on multiple psych medications.  She was given information about a walk-in clinic at [facility] and told that I would not continue to prescribe any psychometric medications until she is seen.

## 2021-12-11 NOTE — Patient Instructions (Addendum)
You need to see psychiatry  [facility]  Phone number Call [phone]  24/7 Walk-in available   [place], [place] [address]   Gabapentin - typically a nerve pain medicine - Would recommend considering -- if you do, would recommend doing twice daily for 1-2 weeks, then once daily for 1 week, then stop

## 2021-12-15 ENCOUNTER — Telehealth: Payer: Self-pay | Admitting: Family Medicine

## 2021-12-15 DIAGNOSIS — G4700 Insomnia, unspecified: Secondary | ICD-10-CM

## 2021-12-15 DIAGNOSIS — F5000 Anorexia nervosa, unspecified: Secondary | ICD-10-CM

## 2021-12-15 DIAGNOSIS — F330 Major depressive disorder, recurrent, mild: Secondary | ICD-10-CM

## 2021-12-16 ENCOUNTER — Ambulatory Visit (HOSPITAL_COMMUNITY)
Admission: EM | Admit: 2021-12-16 | Discharge: 2021-12-16 | Disposition: A | Discharge status: Home / Self Care | Payer: 59 | Attending: Registered Nurse | Admitting: Registered Nurse

## 2021-12-16 DIAGNOSIS — F411 Generalized anxiety disorder: Secondary | ICD-10-CM | POA: Diagnosis present

## 2021-12-16 DIAGNOSIS — G4700 Insomnia, unspecified: Secondary | ICD-10-CM | POA: Diagnosis not present

## 2021-12-16 DIAGNOSIS — F19982 Other psychoactive substance use, unspecified with psychoactive substance-induced sleep disorder: Secondary | ICD-10-CM | POA: Diagnosis present

## 2021-12-16 DIAGNOSIS — F339 Major depressive disorder, recurrent, unspecified: Secondary | ICD-10-CM | POA: Diagnosis not present

## 2021-12-16 DIAGNOSIS — Z634 Disappearance and death of family member: Secondary | ICD-10-CM

## 2021-12-16 NOTE — Telephone Encounter (Signed)
Please call patient.   During our office visit I discussed that due to other sedating medications I will not be prescribing Ambien for her and feel that a psychiatrist should fill this.   I've never filled for her.   At the [facility] visit she was provided with the numbers for several psychiatry groups that she can follow-up with. I can place a new referral as well for her to get psychiatric support.   Seroquel 100 mg #90 was sent on 12/11/2021 - she should not be out Trazodone 100 mg #30 was sent on 12/11/2021 - she should not be out, and this dose will not be increased  [facility] Call.   Contact information: [place] [address] [phone]              [facility]. Call.   Contact information: [place] [address] [place], [place]. Call.   Specialty: [facility] information: [address] [place] [address] [phone]              [place], [facility]. Call.   Contact information: [address]   [address] [place] [address] [phone]

## 2021-12-16 NOTE — Telephone Encounter (Signed)
Carla Martinez CALLED IN AND STATED THAT DR. VODY TOLD HER TO GO AN [facility] TO SEE A PSYCHLOGIST AND THEY COULDN'T HELP HER AND SHE IS OUT OF HER MEDICATION.   Encourage patient to contact the pharmacy for refills or they can request refills through [place]:  Please schedule appointment if longer than 1 year  NEXT APPOINTMENT DATE:  MEDICATION: QUEtiapine (SEROQUEL) 100 MG tablet traZODone (DESYREL) 100 MG tablet zolpidem (AMBIEN) 10 MG   Is the patient out of medication? YES- until she gets in with a doctor  PHARMACY: [facility] [address]  Let patient know to contact pharmacy at the end of the day to make sure medication is ready.  Please notify patient to allow 48-72 hours to process  CLINICAL FILLS OUT ALL BELOW:   LAST REFILL:  QTY:  REFILL DATE:    OTHER COMMENTS:    Okay for refill?  Please advise

## 2021-12-16 NOTE — Addendum Note (Signed)
Addended by: [name] on: 12/16/2021 02:41 PM   Modules accepted: Orders

## 2021-12-16 NOTE — Discharge Instructions (Signed)
You can also call the toll-free phone on insurance card to assist with identifying in network counselors and agencies

## 2021-12-16 NOTE — Progress Notes (Signed)
°   12/16/21 1041  [place] (Walk-ins at [facility] only)  What Is the Reason for Your Visit/Call Today? Patient was referred by PCP with [facility].  She was told to come in and she would be seen by a psychiatrist.  Patient's PCP stopped her Lorrin Mais, prescribed by [facility](which she had been continuing since her [facility] admission). She also decreased pt's trazadone dose from 300mg  to 100mg .  She has not slept well for 2 nights, only dozing off at times.  She reports worsening anxiety since the medication changes.  She denies SI, HI, AVH and is 2 yrs sober aside from a brief episode in May 2022. Patient is hoping to speak to a provider today about recent med changes and hoping to be referred to a psychiatrist.  How Long Has This Been Causing You Problems? 1 wk - 1 month  Have You Recently Had Any Thoughts About Hurting Yourself? No  Are You Planning to Commit Suicide/Harm Yourself At This time? No  Have you Recently Had Thoughts About [place]? No  Are You Planning To Harm Someone At This Time? No  Are you currently experiencing any auditory, visual or other hallucinations? No  Have You Used Any Alcohol or Drugs in the Past 24 Hours? No  Do you have any current medical co-morbidities that require immediate attention? No  Clinician description of patient physical appearance/behavior: Patient reports feeling tired, is calm,cooperative and AAOx5  What Do You Feel Would Help You the Most Today? Medication(s)  If access to [facility] was not available, would you have sought care in the Emergency Department? No  Determination of Need Routine (7 days)  Options For Referral Medication Management

## 2021-12-16 NOTE — ED Provider Notes (Signed)
Behavioral Health Urgent Care Medical Screening Exam  Patient Name: Carla Martinez MRN: [id number] Date of Evaluation: 12/16/21 Chief Complaint:   Diagnosis:  Final diagnoses:  Insomnia, unspecified type    History of Present illness: Carla Martinez is a 61 y.o. female patient presented to [facility] as a walk in with complaints of insomnia and wanting a outpatient psychiatry for medication management.  Carla Martinez, 61 y.o., female patient seen face to face by this provider, consulted with Dr. Ernie Hew; and chart reviewed on 12/16/21.  On evaluation Carla Martinez reports she was referred by her primary care provider to seek psychiatry for medication management of her psychotropic medications.  Patient reports she has had difficulty trying to find a psychiatrist.  Patient reports she has a history of anxiety, depression, and she has been grieving her daughter.  Patient reports her daughter passed away 04/02/21 and that she is still grieving her.  Patient reports they have been several medication changes to her medicines and since the last change she has not been able to sleep well.  Patient reports her primary care provider feels that she needs to see a psychiatry for further medication adjustments or additions.  Patient denies suicidal/self-harm/homicidal ideations, psychosis, paranoia.  Patient reports she does have a history of multiple psychiatric hospitalization years ago.  Patient reports she lives with her son whom she is also the primary caregiver and guardian for, and her 66 year old mother.  Patient reports that no other concerns and that she is not concerned about getting counseling at this time but rather see again outpatient psychiatry for medication management. During evaluation Carla Martinez is sitting upright in chair in no acute distress.  She is alert/oriented x 4; calm/cooperative; and mood congruent with affect.  He is speaking in a clear tone  at moderate volume, and normal pace; with good eye contact.  Her thought process is coherent and relevant; There is no indication that she is currently responding to internal/external stimuli or experiencing delusional thought content; and she has denied suicidal/self-harm/homicidal ideation, psychosis, and paranoia.   Patient has remained calm throughout assessment and has answered questions appropriately.    At this time Carla Martinez is educated and verbalizes understanding of mental health resources and other crisis services in the community. She is instructed to call 911 and present to the nearest emergency room should she experience any suicidal/homicidal ideation, auditory/visual/hallucinations, or detrimental worsening of her mental health condition.  She was a also advised by Probation officer that she could call the toll-free phone on insurance card to assist with identifying in network counselors and agencies or number on back of Medicaid card to speak with care coordinator    Psychiatric Specialty Exam  Presentation  General Appearance:Appropriate for Environment  Eye Contact:Good  Speech:Clear and Coherent; Normal Rate  Speech Volume:Normal  Handedness:Right   Mood and Affect  Mood:Anxious  Affect:Appropriate; Congruent   Thought Process  Thought Processes:Coherent; Goal Directed  Descriptions of Associations:Intact  Orientation:Full (Time, Place and Person)  Thought Content:Logical; WDL  Diagnosis of Schizophrenia or Schizoaffective disorder in past: No  Duration of Psychotic Symptoms: Less than six months  Hallucinations:None Unknown Unknown  Ideas of Reference:None  Suicidal Thoughts:No  Homicidal Thoughts:No   Sensorium  Memory:Immediate Good; Recent Good; Remote Good  Judgment:Intact  Insight:Present   Executive Functions  Concentration:Good  Attention Span:Good  [place] of Knowledge:Good  Language:Good   Psychomotor Activity   Psychomotor Activity:Normal   Assets  Assets:Communication  Skills; Desire for Improvement; Financial Resources/Insurance; Housing; Social Support   Sleep  Sleep:Poor  Number of hours: No data recorded  Nutritional Assessment (For OBS and [facility] admissions only) Has the patient had a weight loss or gain of 10 pounds or more in the last 3 months?: No Has the patient had a decrease in food intake/or appetite?: No Does the patient have dental problems?: No Does the patient have eating habits or behaviors that may be indicators of an eating disorder including binging or inducing vomiting?: No Has the patient been eating poorly because of a decreased appetite?: 0    Physical Exam: Physical Exam Vitals and nursing note reviewed. Exam conducted with a chaperone present.  Constitutional:      General: She is not in acute distress.    Appearance: Normal appearance. She is not ill-appearing.  Cardiovascular:     Rate and Rhythm: Normal rate.  Pulmonary:     Effort: Pulmonary effort is normal.  Musculoskeletal:        General: Normal range of motion.     Cervical back: Normal range of motion.  Skin:    General: Skin is warm and dry.  Neurological:     Mental Status: She is alert and oriented to person, place, and time.  Psychiatric:        Attention and Perception: Attention and perception normal. She does not perceive auditory or visual hallucinations.        Mood and Affect: Mood is anxious.        Speech: Speech normal.        Behavior: Behavior normal. Behavior is cooperative.        Thought Content: Thought content normal. Thought content is not paranoid or delusional. Thought content does not include homicidal or suicidal ideation.        Cognition and Memory: Cognition and memory normal.        Judgment: Judgment normal.   Review of Systems  Constitutional: Negative.   HENT: Negative.    Eyes: Negative.   Respiratory: Negative.    Cardiovascular: Negative.    Gastrointestinal: Negative.   Genitourinary: Negative.   Musculoskeletal: Negative.   Skin: Negative.   Neurological: Negative.   Endo/Heme/Allergies: Negative.   Psychiatric/Behavioral:  Negative for suicidal ideas. Depression: Stable.The patient is nervous/anxious and has insomnia.   Blood pressure 112/73, pulse (!) 111, temperature 98.3 F (36.8 C), temperature source Oral, resp. rate 18, height 5\' 3"  (1.6 m), weight 93 lb (42.2 kg), SpO2 97 %. Body mass index is 16.47 kg/m.  Musculoskeletal: Strength & Muscle Tone: within normal limits Gait & Station: normal Patient leans: N/A   [place] [facility] Discharge Disposition for Follow up and Recommendations: Based on my evaluation the patient does not appear to have an emergency medical condition and can be discharged with resources and follow up care in outpatient services for [facility] Call.   Contact information: [place] [address] [phone]         [facility]. Call.   Contact information: [address]egin[address], [place]. Call.   Specialty: [facility] information: [address] [place] [address] [phone]         [place], [facility]. Call.   Contact information: [address] [place] [address] [phone]                  Discharge Instructions      You can also call the toll-free phone on insurance card to assist with identifying in network counselors and agencies       Skilar Marcou, NP 12/16/2021, 11:53 AM

## 2021-12-17 ENCOUNTER — Other Ambulatory Visit: Payer: Self-pay | Admitting: Family Medicine

## 2021-12-17 ENCOUNTER — Encounter: Payer: Self-pay | Admitting: *Deleted

## 2021-12-17 DIAGNOSIS — F5000 Anorexia nervosa, unspecified: Secondary | ICD-10-CM

## 2021-12-17 DIAGNOSIS — F330 Major depressive disorder, recurrent, mild: Secondary | ICD-10-CM

## 2021-12-17 NOTE — Telephone Encounter (Signed)
Pt stated that she has been calling different place an no one will fill her meds. She wanted to know if Dr. Einar Pheasant would continue to fill them

## 2021-12-18 ENCOUNTER — Telehealth: Payer: Self-pay

## 2021-12-18 ENCOUNTER — Encounter: Payer: Self-pay | Admitting: Gastroenterology

## 2021-12-18 ENCOUNTER — Telehealth (INDEPENDENT_AMBULATORY_CARE_PROVIDER_SITE_OTHER): Payer: 59 | Admitting: Gastroenterology

## 2021-12-18 DIAGNOSIS — K529 Noninfective gastroenteritis and colitis, unspecified: Secondary | ICD-10-CM

## 2021-12-18 NOTE — Telephone Encounter (Signed)
Mychart sent to pt relaying message from Dr. Einar Pheasant.  ?

## 2021-12-18 NOTE — Progress Notes (Signed)
?  ?Carla Sear, MD ?[address]  ?[address]  ?[place], [place] [address]  ?Main: [phone]  ?Fax: [phone] ? ? ? ?Gastroenterology Consultation Video Visit ? ?Referring Provider:     [name], MD ?Primary Care Physician:  Carla Noe, MD ?Primary Gastroenterologist:  Dr. Cephas Martinez ?Reason for Consultation:     History of diarrhea ?      ? HPI:   ?Carla Martinez is a 61 y.o. female referred by Dr. Einar Martinez, Carla Marker, MD  for consultation & management of history of diarrhea, enteritis ? ?Virtual Visit Video Note ? ?I connected with Carla Martinez on 12/18/21 at 12:45 PM EST by video and verified that I am speaking with the correct person using two identifiers. ?  ?I discussed the limitations, risks, security and privacy concerns of performing an evaluation and management service by video and the availability of in person appointments. I also discussed with the patient that there may be a patient responsible charge related to this service. The patient expressed understanding and agreed to proceed. ? ?Location of the Patient: Home ? ?Location of the provider: Office ? ?Persons participating in the visit: Patient and provider only ? ? ?History of Present Illness: ?Patient was unable to connect on MyChart for video visit today.  I called her and converted to phone visit.  Patient has been seen by cardiology, underwent repeat echocardiogram, her LV function is back to normal.  She is doing well overall.  She reports that she has constipation, she tried Linzess and it helped.  However, it was expensive and she could not afford.  She has been taking MiraLAX as needed only.  We have originally planned for her to undergo upper endoscopy and colonoscopy due to enteritis and elevated fecal calprotectin levels.  Stool studies were negative for infection.  ? ?NSAIDs: None ? ?Antiplts/Anticoagulants/Anti thrombotics: None ? ?GI Procedures: None ? ?Past Medical History:  ?Diagnosis Date  ?  Anxiety   ? Depression   ? PAF (paroxysmal atrial fibrillation) (Carla Martinez)   ? Polysubstance abuse (Carla Martinez)   ? Takotsubo cardiomyopathy   ? ? ?Past Surgical History:  ?Procedure Laterality Date  ? CHOLECYSTECTOMY    ? COLON RESECTION    ? 12" removed  ? LEFT HEART CATH AND CORONARY ANGIOGRAPHY N/A 07/05/2021  ? Procedure: LEFT HEART CATH AND CORONARY ANGIOGRAPHY;  Surgeon: Andrez Grime, MD;  Location: [facility];  Service: Cardiovascular;  Laterality: N/A;  ? MENISCUS REPAIR Bilateral   ? RADIOLOGY WITH ANESTHESIA N/A 09/17/2021  ? Procedure: MRI LUMBAR WITH AND WITHOUT CONTRAST; MRI ABDOMEN WITH AND WITHOUT WITH ANESTHESIA;  Surgeon: Radiologist, Medication, MD;  Location: [facility];  Service: Radiology;  Laterality: N/A;  ? ? ?Prior to Admission medications   ?Medication Sig Start Date End Date Taking? Authorizing Provider  ?amitriptyline (ELAVIL) 25 MG tablet TAKE 1 TABLET BY MOUTH AT BEDTIME 11/19/21   [name], [name], MD  ?amoxicillin (AMOXIL) 500 MG tablet Take 500 mg by mouth 3 (three) times daily.    [provider]  ?B Complex-C (SUPER B COMPLEX PO) Take 1 tablet by mouth daily.    [provider]  ?calcium carbonate (OS-CAL - DOSED IN MG OF ELEMENTAL CALCIUM) 1250 (500 Ca) MG tablet Take 1 tablet by mouth 2 (two) times daily with a meal.    [provider]  ?gabapentin (NEURONTIN) 100 MG capsule Take 1 capsule (100 mg total) by mouth 3 (three) times daily.  12/11/21   Carla Noe, MD  ?hydrOXYzine (ATARAX) 50 MG tablet TAKE 2 TABLETS BY MOUTH THREE TIMES DAILY AS NEEDED FOR ANXIETY 11/06/21   Carla Noe, MD  ?linaclotide Rolan Lipa) 290 MCG CAPS capsule Take 1 capsule (290 mcg total) by mouth daily before breakfast. ?Patient not taking: Reported on 12/04/2021 11/06/21   Lin Landsman, MD  ?Magnesium 250 MG TABS Take 250 mg by mouth daily.    [provider]  ?metoprolol succinate (TOPROL-XL) 25 MG 24 hr tablet Take 0.5 tablets (12.5 mg total) by mouth  daily. 12/11/21   Carla Noe, MD  ?Multiple Vitamins-Minerals (MULTIVITAMIN WITH MINERALS) tablet Take 1 tablet by mouth daily. Gummy ?Patient not taking: Reported on 12/11/2021    [provider]  ?pantoprazole (PROTONIX) 40 MG tablet Take 1 tablet by mouth once daily 11/19/21   Carla Noe, MD  ?QUEtiapine (SEROQUEL) 100 MG tablet Take 1 tablet (100 mg total) by mouth 3 (three) times daily. 12/11/21   Carla Noe, MD  ?traZODone (DESYREL) 100 MG tablet Take 1 tablet (100 mg total) by mouth at bedtime. 12/11/21   Carla Noe, MD  ?TURMERIC PO Take 500 mg by mouth daily. w Carla Martinez    [provider]  ?zolpidem (AMBIEN) 10 MG tablet TAKE 1 TABLET BY MOUTH AT BEDTIME 11/08/21   Carla Koch, NP  ? ? ?Family History  ?Problem Relation Age of Onset  ? Hypertension Mother   ? Heart disease Mother   ? Stroke Mother   ? Leukemia Sister   ? Heart disease Sister   ? Down syndrome Son   ? Stroke Son   ? Diabetes type I Child   ?  ? ?Social History  ? ?Tobacco Use  ? Smoking status: Every Day  ?  Packs/day: 0.25  ?  Years: 17.00  ?  Pack years: 4.25  ?  Types: Cigarettes  ?  Start date: 11/25/2003  ? Smokeless tobacco: Never  ? Tobacco comments:  ?  used to smoke 1PPD but has decreased. Down to 3  cigarettes daily  ?Vaping Use  ? Vaping Use: Never used  ?Substance Use Topics  ? Alcohol use: Not Currently  ? Drug use: Never  ? ? ?Allergies as of 12/18/2021  ? (No Known Allergies)  ? ? ?Imaging Studies: ?Reviewed ? ?Assessment and Plan:  ? ?Carla Martinez is a 61 y.o. female with history of colon resection, chronic tobacco use, history of alcohol use is seen in consultation for chronic diarrhea, lower abdominal pain, low back pain and elevated fecal calprotectin levels.  Her stool studies are negative for infectious etiology.  Patient was planned to undergo upper endoscopy and colonoscopy with biopsies.  However, patient was hospitalized secondary to altered mental status and Takotsubo  cardiomyopathy.  Altered mental status was secondary to usage of multiple psychotropic medications.  Her AMS has currently resolved.  Repeat 2D echogram revealed resolution of cardiomyopathy.  Her LV function is back to normal. ? ?Patient had constipation which resolved after taking Linzess.  She could not afford the medication ? ?MR enterography unremarkable ?We will proceed with upper endoscopy and colonoscopy as planned.  Patient will be out of town and she will call our office back next week ? ? ?Follow Up Instructions: ?  ?I discussed the assessment and treatment plan with the patient. The patient was provided an opportunity to ask questions and all were answered. The patient agreed with the plan and demonstrated  an understanding of the instructions. ?  ?The patient was advised to call back or seek an in-person evaluation if the symptoms worsen or if the condition fails to improve as anticipated. ? ?I provided 15 minutes of face-to-face time during this encounter. ? ? ?Follow up after the above work-up ? ? ?Carla Darby, MD  ? ?

## 2021-12-18 NOTE — Telephone Encounter (Signed)
Per Dr. Marius Ditch She could not connect to the video visit, call her and did a phone visit.  She said she will call our office back next week to schedule upper endoscopy and colonoscopy.  She is good from cardiac standpoint ?

## 2021-12-18 NOTE — Telephone Encounter (Signed)
When patient calls to schedule what diagnosis do you want for the colonoscopy and EGD  ?

## 2021-12-18 NOTE — Telephone Encounter (Signed)
History of enteritis, diarrhea ?

## 2021-12-18 NOTE — Telephone Encounter (Signed)
Routing to referral coordinator to help with getting her into psychiatric care.  ? ?Routing to MA to relay the following ? ?I can continue to fill the low dose Trazodone 100 mg daily and seroquel 100 mg TID.  ? ?I will not be prescribing ambien ?

## 2021-12-19 ENCOUNTER — Telehealth: Payer: Self-pay

## 2021-12-19 NOTE — Telephone Encounter (Signed)
Mychart message was sent to patient and she is aware of where her referral was sent and who she can call to schedule ?

## 2021-12-19 NOTE — Telephone Encounter (Signed)
Patient called states she requested refill of her Hydroxyzine called into pharmacy. Let her know she is not due for refill yet should have some sill. She will call pharmacy thinks she still has one refill there and let us know if anything needed.  ?

## 2021-12-23 ENCOUNTER — Other Ambulatory Visit: Payer: Self-pay | Admitting: Gastroenterology

## 2021-12-24 ENCOUNTER — Other Ambulatory Visit: Payer: Self-pay | Admitting: Family Medicine

## 2021-12-24 DIAGNOSIS — F5000 Anorexia nervosa, unspecified: Secondary | ICD-10-CM

## 2021-12-24 DIAGNOSIS — F330 Major depressive disorder, recurrent, mild: Secondary | ICD-10-CM

## 2022-01-23 ENCOUNTER — Other Ambulatory Visit: Payer: Self-pay | Admitting: Gastroenterology

## 2022-02-03 ENCOUNTER — Other Ambulatory Visit: Payer: Self-pay | Admitting: Family Medicine

## 2022-02-03 DIAGNOSIS — G4700 Insomnia, unspecified: Secondary | ICD-10-CM

## 2022-02-03 DIAGNOSIS — F419 Anxiety disorder, unspecified: Secondary | ICD-10-CM

## 2022-02-03 DIAGNOSIS — F3340 Major depressive disorder, recurrent, in remission, unspecified: Secondary | ICD-10-CM

## 2022-02-04 ENCOUNTER — Ambulatory Visit (HOSPITAL_COMMUNITY): Payer: 59 | Admitting: Physician Assistant

## 2022-02-04 NOTE — Telephone Encounter (Signed)
Please call patient.  ? ?I've sent in 30 day supply.  ? ?She was referred to ? ?  ?-Dr Chucky May with psych ?[id number] ?[place], [place] [address] ?Phone: [phone] ? ?Has she made an appointment? If not, advise that she call and update when appointment is scheduled for.  ?

## 2022-02-04 NOTE — Telephone Encounter (Signed)
Last written 12-11-21 #90/1 ?Last OV 12-11-21 ?No Future OV ?[place]

## 2022-02-06 ENCOUNTER — Other Ambulatory Visit: Payer: Self-pay | Admitting: Family Medicine

## 2022-02-06 DIAGNOSIS — F3340 Major depressive disorder, recurrent, in remission, unspecified: Secondary | ICD-10-CM

## 2022-02-06 DIAGNOSIS — F330 Major depressive disorder, recurrent, mild: Secondary | ICD-10-CM

## 2022-02-06 DIAGNOSIS — F419 Anxiety disorder, unspecified: Secondary | ICD-10-CM

## 2022-02-06 DIAGNOSIS — F5000 Anorexia nervosa, unspecified: Secondary | ICD-10-CM

## 2022-02-06 DIAGNOSIS — G4700 Insomnia, unspecified: Secondary | ICD-10-CM

## 2022-02-06 NOTE — Telephone Encounter (Signed)
Called and spoke w/ patient she  said that she had an appt to go and the day of her appt she  vomiting w/ upset stomach and wasn't able to go. However she said that she  reschedule her appt for sometime in June.  ?

## 2022-02-13 ENCOUNTER — Other Ambulatory Visit: Payer: Self-pay | Admitting: Family Medicine

## 2022-02-13 DIAGNOSIS — F419 Anxiety disorder, unspecified: Secondary | ICD-10-CM

## 2022-02-13 DIAGNOSIS — G4700 Insomnia, unspecified: Secondary | ICD-10-CM

## 2022-03-06 ENCOUNTER — Emergency Department (HOSPITAL_COMMUNITY): Payer: 59

## 2022-03-06 ENCOUNTER — Encounter (HOSPITAL_COMMUNITY): Payer: Self-pay | Admitting: Emergency Medicine

## 2022-03-06 ENCOUNTER — Other Ambulatory Visit: Payer: Self-pay

## 2022-03-06 ENCOUNTER — Emergency Department (HOSPITAL_COMMUNITY)
Admission: EM | Admit: 2022-03-06 | Discharge: 2022-03-07 | Disposition: A | Discharge status: Home / Self Care | Payer: 59 | Attending: Emergency Medicine | Admitting: Emergency Medicine

## 2022-03-06 DIAGNOSIS — F10232 Alcohol dependence with withdrawal with perceptual disturbance: Secondary | ICD-10-CM | POA: Diagnosis not present

## 2022-03-06 DIAGNOSIS — F411 Generalized anxiety disorder: Secondary | ICD-10-CM | POA: Diagnosis not present

## 2022-03-06 DIAGNOSIS — R9431 Abnormal electrocardiogram [ECG] [EKG]: Secondary | ICD-10-CM | POA: Diagnosis present

## 2022-03-06 DIAGNOSIS — R251 Tremor, unspecified: Secondary | ICD-10-CM | POA: Insufficient documentation

## 2022-03-06 DIAGNOSIS — F339 Major depressive disorder, recurrent, unspecified: Secondary | ICD-10-CM | POA: Diagnosis present

## 2022-03-06 DIAGNOSIS — I4891 Unspecified atrial fibrillation: Secondary | ICD-10-CM | POA: Diagnosis not present

## 2022-03-06 DIAGNOSIS — R112 Nausea with vomiting, unspecified: Secondary | ICD-10-CM | POA: Diagnosis present

## 2022-03-06 DIAGNOSIS — F10939 Alcohol use, unspecified with withdrawal, unspecified: Secondary | ICD-10-CM | POA: Diagnosis present

## 2022-03-06 DIAGNOSIS — Y902 Blood alcohol level of 40-59 mg/100 ml: Secondary | ICD-10-CM | POA: Diagnosis not present

## 2022-03-06 DIAGNOSIS — Z79899 Other long term (current) drug therapy: Secondary | ICD-10-CM | POA: Insufficient documentation

## 2022-03-06 DIAGNOSIS — F10932 Alcohol use, unspecified with withdrawal with perceptual disturbance: Secondary | ICD-10-CM | POA: Diagnosis not present

## 2022-03-06 HISTORY — DX: Alcohol use, unspecified with withdrawal, unspecified: F10.939

## 2022-03-06 LAB — COMPREHENSIVE METABOLIC PANEL
ALT: 26 U/L (ref 0–44)
AST: 37 U/L (ref 15–41)
Albumin: 3.6 g/dL (ref 3.5–5.0)
Alkaline Phosphatase: 121 U/L (ref 38–126)
Anion gap: 22 — ABNORMAL HIGH (ref 5–15)
BUN: 24 mg/dL — ABNORMAL HIGH (ref 6–20)
CO2: 21 mmol/L — ABNORMAL LOW (ref 22–32)
Calcium: 8.9 mg/dL (ref 8.9–10.3)
Chloride: 96 mmol/L — ABNORMAL LOW (ref 98–111)
Creatinine, Ser: 0.77 mg/dL (ref 0.44–1.00)
GFR, Estimated: >60 mL/min (ref >60)
Glucose, Bld: 115 mg/dL — ABNORMAL HIGH (ref 70–99)
Potassium: 4.3 mmol/L (ref 3.5–5.1)
Sodium: 139 mmol/L (ref 135–145)
Total Bilirubin: 0.8 mg/dL (ref 0.3–1.2)
Total Protein: 6.4 g/dL — ABNORMAL LOW (ref 6.5–8.1)

## 2022-03-06 LAB — CBC WITH DIFFERENTIAL/PLATELET
Abs Immature Granulocytes: 0.12 10*3/uL — ABNORMAL HIGH (ref 0.00–0.07)
Basophils Absolute: 0 10*3/uL (ref 0.0–0.1)
Basophils Relative: 0 %
Eosinophils Absolute: 0 10*3/uL (ref 0.0–0.5)
Eosinophils Relative: 0 %
HCT: 34.9 % — ABNORMAL LOW (ref 36.0–46.0)
Hemoglobin: 12 g/dL (ref 12.0–15.0)
Immature Granulocytes: 1 %
Lymphocytes Relative: 6 %
Lymphs Abs: 1.1 10*3/uL (ref 0.7–4.0)
MCH: 29 pg (ref 26.0–34.0)
MCHC: 34.4 g/dL (ref 30.0–36.0)
MCV: 84.3 fL (ref 80.0–100.0)
Monocytes Absolute: 0.8 10*3/uL (ref 0.1–1.0)
Monocytes Relative: 4 %
Neutro Abs: 17.3 10*3/uL — ABNORMAL HIGH (ref 1.7–7.7)
Neutrophils Relative %: 89 %
Platelets: 360 10*3/uL (ref 150–400)
RBC: 4.14 MIL/uL (ref 3.87–5.11)
RDW: 13.4 % (ref 11.5–15.5)
WBC: 19.4 10*3/uL — ABNORMAL HIGH (ref 4.0–10.5)
nRBC: 0 % (ref 0.0–0.2)

## 2022-03-06 LAB — TROPONIN I (HIGH SENSITIVITY)
Troponin I (High Sensitivity): 3 ng/L (ref <18)
Troponin I (High Sensitivity): 4 ng/L

## 2022-03-06 LAB — PHOSPHORUS: Phosphorus: 2 mg/dL — ABNORMAL LOW (ref 2.5–4.6)

## 2022-03-06 LAB — ETHANOL: Alcohol, Ethyl (B): 46 mg/dL — ABNORMAL HIGH (ref <10)

## 2022-03-06 LAB — MAGNESIUM: Magnesium: 1.7 mg/dL (ref 1.7–2.4)

## 2022-03-06 IMAGING — CT CT ABD-PELV W/ CM
2 of 5 series · 16 of 46 positions shown, 18 images · IV contrast (agent unspecified)
Comparison: [DATE]

CLINICAL DATA: Nausea and vomiting, history of alcohol abuse

EXAM:
CT ABDOMEN AND PELVIS WITH CONTRAST
TECHNIQUE: Multidetector CT imaging of the abdomen and pelvis was performed
using the standard protocol following bolus administration of
intravenous contrast.

[Series 2: axial st · axial · 0.68mm/px · z∈[-410,-50]mm · 13 of 86 slices shown, 15 images]
[im 7/86  soft-tissue]
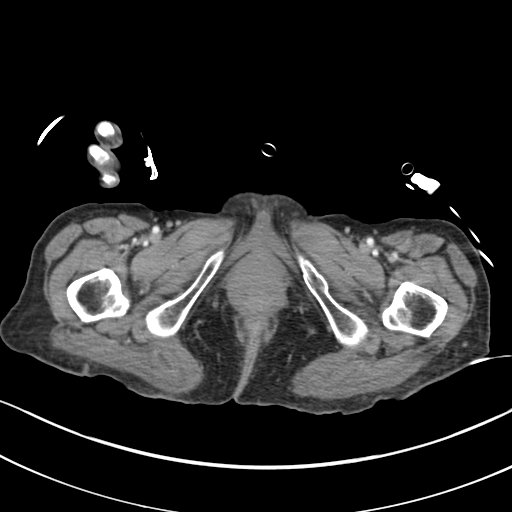
[im 7/86  bone]
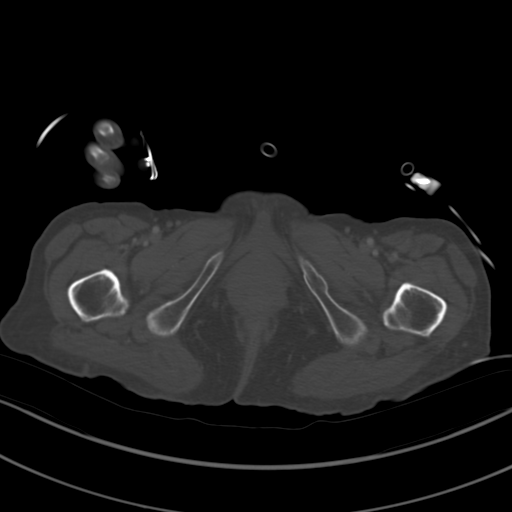
[im 13/86  soft-tissue]
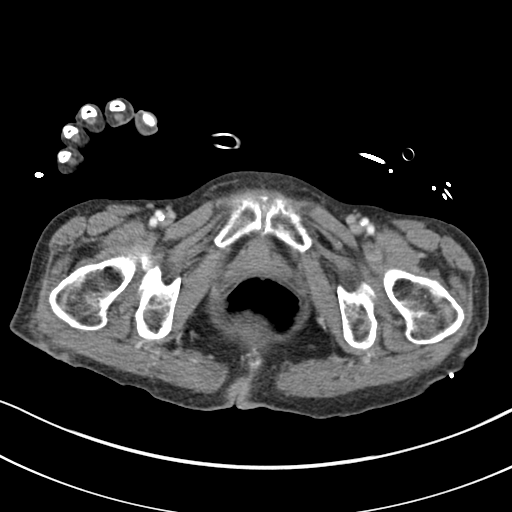
[im 19/86  soft-tissue]
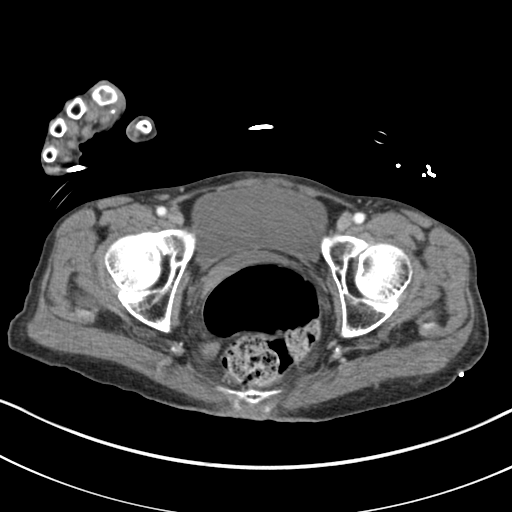
[im 25/86  soft-tissue]
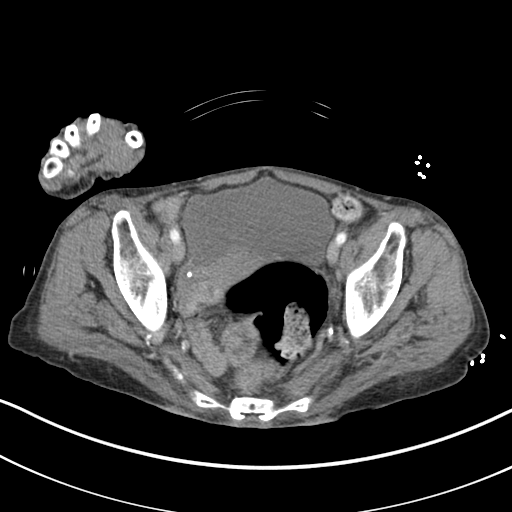
[im 31/86  soft-tissue]
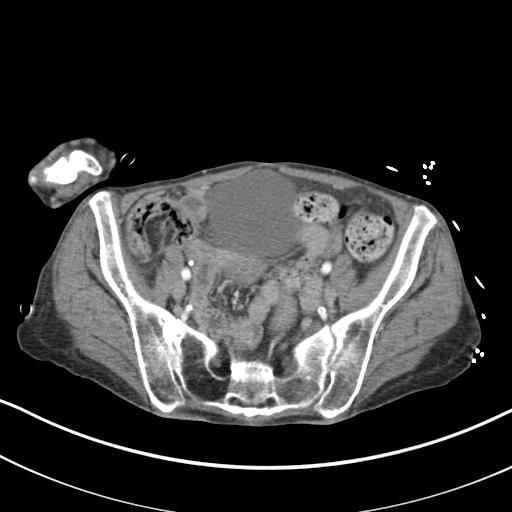
[im 37/86  soft-tissue]
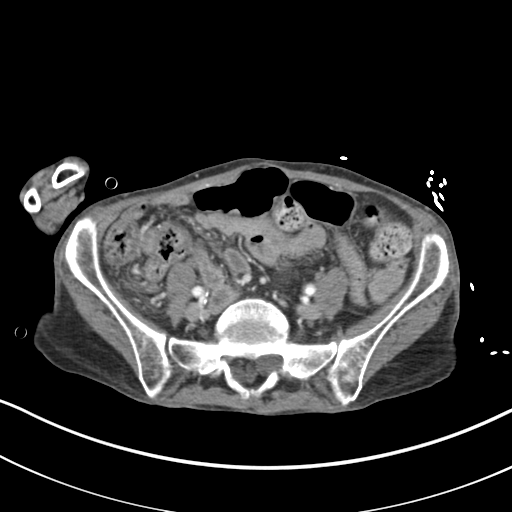
[im 43/86  soft-tissue]
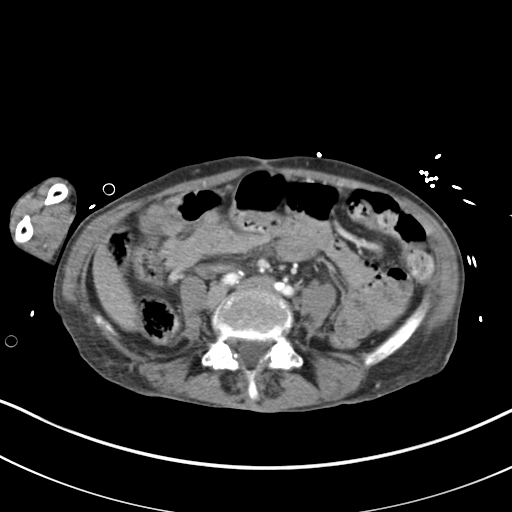
[im 49/86  soft-tissue]
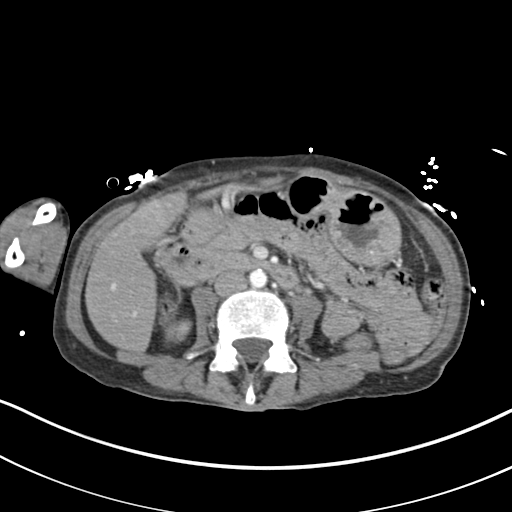
[im 55/86  soft-tissue]
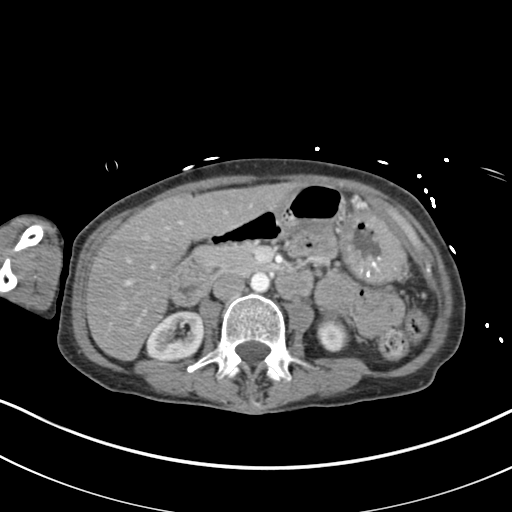
[im 55/86  bone]
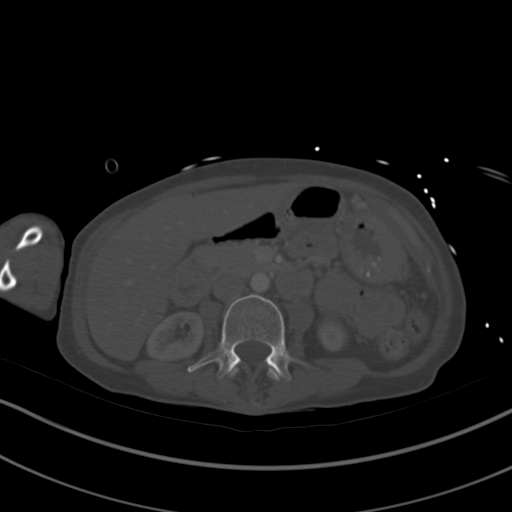
[im 61/86  soft-tissue]
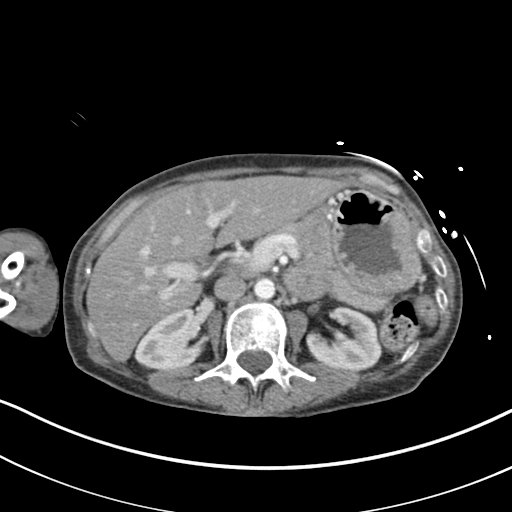
[im 67/86  soft-tissue]
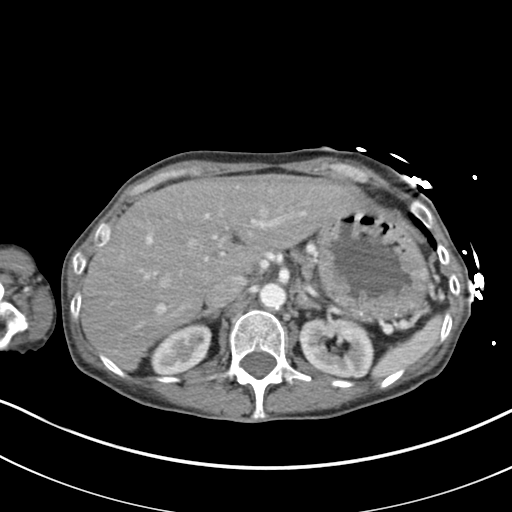
[im 73/86  soft-tissue]
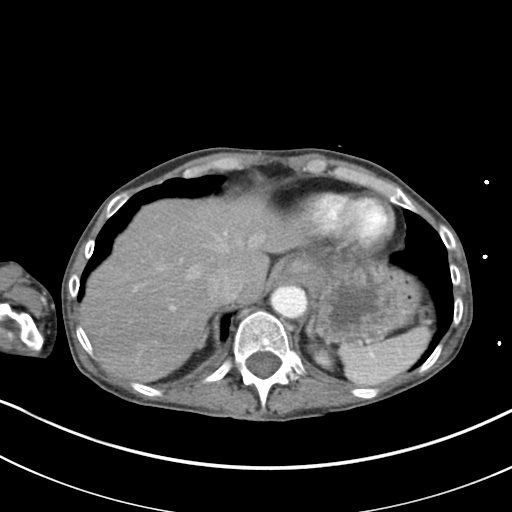
[im 79/86  soft-tissue]
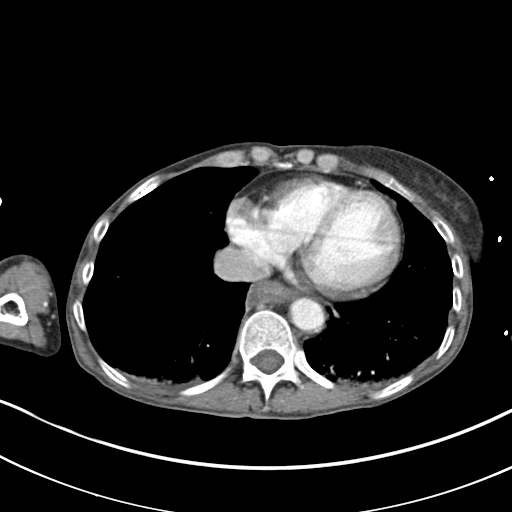

[Series 4: coronal st · coronal · 0.71mm/px · 3 of 101 slices shown]
[im 34/101  soft-tissue]
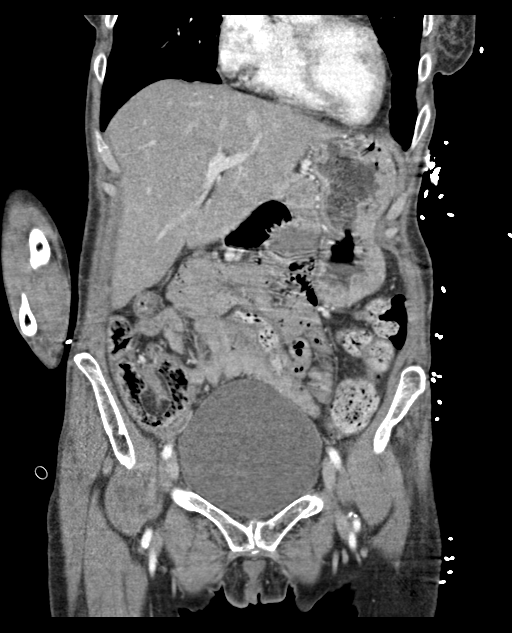
[im 45/101  soft-tissue]
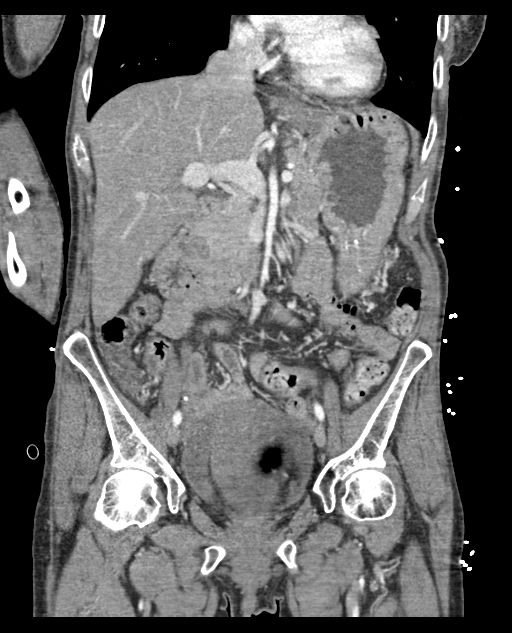
[im 56/101  soft-tissue]
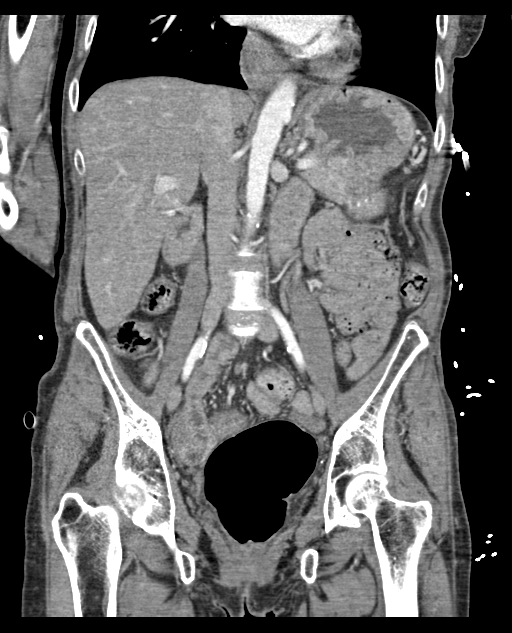

[16 of 46 positions shown; findings below may reference images not displayed]

RADIATION DOSE REDUCTION: This exam was performed according to the
departmental dose-optimization program which includes automated
exposure control, adjustment of the mA and/or kV according to
patient size and/or use of iterative reconstruction technique.

CONTRAST:  80mL OMNIPAQUE IOHEXOL 300 MG/ML  SOLN
FINDINGS: Lower chest: Hypoventilatory changes within the dependent lower
lobes. No acute pleural or parenchymal lung disease.

Hepatobiliary: Cholecystectomy. Hepatic steatosis. Stable right lobe
liver hemangioma.

Pancreas: Unremarkable. No pancreatic ductal dilatation or
surrounding inflammatory changes.

Spleen: Normal in size without focal abnormality.

Adrenals/Urinary Tract: Adrenal glands are unremarkable. Kidneys are
normal, without renal calculi, focal lesion, or hydronephrosis.
Bladder is unremarkable.

Stomach/Bowel: No bowel obstruction or ileus. Normal retrocecal
appendix. No bowel wall thickening or inflammatory change.

Vascular/Lymphatic: Aortic atherosclerosis. No enlarged abdominal or
pelvic lymph nodes.

Reproductive: Uterus and bilateral adnexa are unremarkable.

Other: No free fluid or free intraperitoneal gas. No abdominal wall
hernia.

Musculoskeletal: No acute or destructive bony lesions. Chronic L2
compression deformity. Stable Tarlov cysts within the lower lumbar
spine and sacrum. Reconstructed images demonstrate no additional
findings.
IMPRESSION: 1. No acute intra-abdominal or intrapelvic process.
2. Hepatic steatosis.
3.  Aortic Atherosclerosis ([OK]-[OK]).

## 2022-03-06 MED ORDER — PROCHLORPERAZINE EDISYLATE 10 MG/2ML IJ SOLN
10.0000 mg | Freq: Once | INTRAMUSCULAR | Status: AC
  Administered 2022-03-06: 10 mg via INTRAVENOUS
  Filled 2022-03-06: qty 2

## 2022-03-06 MED ORDER — LORAZEPAM 2 MG/ML IJ SOLN: 0.0000 mg | Freq: Two times a day (BID) | INTRAMUSCULAR | Status: DC

## 2022-03-06 MED ORDER — ADULT MULTIVITAMIN W/MINERALS CH
1.0000 | ORAL_TABLET | Freq: Every day | ORAL | Status: DC
  Administered 2022-03-06 – 2022-03-07 (×2): 1 via ORAL
  Filled 2022-03-06 (×2): qty 1

## 2022-03-06 MED ORDER — THIAMINE HCL 100 MG PO TABS
100.0000 mg | ORAL_TABLET | Freq: Every day | ORAL | Status: DC
  Administered 2022-03-06 – 2022-03-07 (×2): 100 mg via ORAL
  Filled 2022-03-06 (×2): qty 1

## 2022-03-06 MED ORDER — LORAZEPAM 2 MG/ML IJ SOLN
1.0000 mg | INTRAMUSCULAR | Status: DC | PRN
  Administered 2022-03-06: 3 mg via INTRAVENOUS

## 2022-03-06 MED ORDER — SODIUM CHLORIDE (PF) 0.9 % IJ SOLN
INTRAMUSCULAR | Status: AC
  Filled 2022-03-06: qty 50

## 2022-03-06 MED ORDER — ONDANSETRON HCL 4 MG/2ML IJ SOLN
4.0000 mg | Freq: Once | INTRAMUSCULAR | Status: AC
  Administered 2022-03-06: 4 mg via INTRAVENOUS
  Filled 2022-03-06: qty 2

## 2022-03-06 MED ORDER — FOLIC ACID 1 MG PO TABS
1.0000 mg | ORAL_TABLET | Freq: Every day | ORAL | Status: DC
  Administered 2022-03-06 – 2022-03-07 (×2): 1 mg via ORAL
  Filled 2022-03-06 (×2): qty 1

## 2022-03-06 MED ORDER — THIAMINE HCL 100 MG/ML IJ SOLN: 100.0000 mg | Freq: Every day | INTRAMUSCULAR | Status: DC

## 2022-03-06 MED ORDER — IOHEXOL 300 MG/ML  SOLN
100.0000 mL | Freq: Once | INTRAMUSCULAR | Status: AC | PRN
  Administered 2022-03-06: 80 mL via INTRAVENOUS

## 2022-03-06 MED ORDER — LORAZEPAM 2 MG/ML IJ SOLN
0.0000 mg | Freq: Four times a day (QID) | INTRAMUSCULAR | Status: DC
  Filled 2022-03-06: qty 2

## 2022-03-06 MED ORDER — SODIUM CHLORIDE 0.9 % IV BOLUS
1000.0000 mL | Freq: Once | INTRAVENOUS | Status: AC
Start: 2022-03-06 — End: 2022-03-07
  Administered 2022-03-06: 1000 mL via INTRAVENOUS

## 2022-03-06 MED ORDER — LORAZEPAM 1 MG PO TABS
1.0000 mg | ORAL_TABLET | ORAL | Status: DC | PRN
  Administered 2022-03-06: 2 mg via ORAL
  Filled 2022-03-06: qty 2

## 2022-03-06 NOTE — ED Provider Notes (Signed)
[facility] DEPT Provider Note   CSN: [id number] Arrival date & time: 03/06/22  1742     History  No chief complaint on file.   Carla Martinez is a 61 y.o. female.  Patient brought in by EMS with nausea and vomiting which she believes is secondary to alcohol withdrawal.  Unable to tell me when her last drink was.  States she drinks small bottles of wine on a regular basis about 5 or 6 a day.  Does not know when she last drank.  States she been feeling shaky and tremulous with nausea and vomiting all day today.  Unknown how many times she has vomited.  Denies any blood in her emesis.  Denies any diarrhea.  Denies any fever, chest pain, shortness of breath.  Does have some upper abdominal pain.  Denies any other drug use. Has a past medical history of anxiety, depression, atrial fibrillation not on anticoagulation  The history is provided by the patient and the EMS personnel. The history is limited by the condition of the patient.      Home Medications Prior to Admission medications   Medication Sig Start Date End Date Taking? Authorizing Provider  amitriptyline (ELAVIL) 25 MG tablet TAKE 1 TABLET BY MOUTH AT BEDTIME 01/26/22   Vanga, [name], MD  amoxicillin (AMOXIL) 500 MG tablet Take 500 mg by mouth 3 (three) times daily.    [provider]  B Complex-C (SUPER B COMPLEX PO) Take 1 tablet by mouth daily.    [provider]  calcium carbonate (OS-CAL - DOSED IN MG OF ELEMENTAL CALCIUM) 1250 (500 Ca) MG tablet Take 1 tablet by mouth 2 (two) times daily with a meal.    [provider]  gabapentin (NEURONTIN) 100 MG capsule Take 1 capsule (100 mg total) by mouth 3 (three) times daily. Pt needs appt with PCP for further refills. 02/17/22   [name], MD  hydrOXYzine (ATARAX) 50 MG tablet TAKE 2 TABLETS BY MOUTH THREE TIMES DAILY AS NEEDED FOR ANXIETY 02/06/22   [name], MD  linaclotide [facility]) 290 MCG CAPS  capsule Take 1 capsule (290 mcg total) by mouth daily before breakfast. Patient not taking: Reported on 12/04/2021 11/06/21   Lin Landsman, MD  Magnesium 250 MG TABS Take 250 mg by mouth daily.    [provider]  metoprolol succinate (TOPROL-XL) 25 MG 24 hr tablet Take 0.5 tablets (12.5 mg total) by mouth daily. 12/11/21   [name], MD  Multiple Vitamins-Minerals (MULTIVITAMIN WITH MINERALS) tablet Take 1 tablet by mouth daily. Gummy Patient not taking: Reported on 12/11/2021    [provider]  pantoprazole (PROTONIX) 40 MG tablet Take 1 tablet by mouth once daily 12/18/21   [name], MD  QUEtiapine (SEROQUEL) 100 MG tablet TAKE 1 TABLET BY MOUTH THREE TIMES DAILY 02/04/22   [name], MD  traZODone (DESYREL) 100 MG tablet TAKE 1 TABLET BY MOUTH AT BEDTIME 02/06/22   [name], MD  TURMERIC PO Take 500 mg by mouth daily. w [name]    [provider]  zolpidem (AMBIEN) 10 MG tablet TAKE 1 TABLET BY MOUTH AT BEDTIME 11/08/21   Pleas Koch, NP      Allergies    Patient has no known allergies.    Review of Systems   Review of Systems  Constitutional:  Positive for activity change and appetite change. Negative for fever.  HENT:  Negative for congestion.  Respiratory:  Negative for cough, chest tightness and shortness of breath.   Cardiovascular:  Negative for chest pain.  Gastrointestinal:  Positive for abdominal pain, nausea and vomiting.  Genitourinary:  Negative for dysuria and hematuria.  Musculoskeletal:  Negative for arthralgias and myalgias.  Skin:  Negative for rash.  Neurological:  Negative for dizziness, weakness and headaches.   all other systems are negative except as noted in the HPI and PMH.   Physical Exam Updated Vital Signs BP 122/71   Pulse 76   Temp 97.6 F (36.4 C) (Oral)   Resp 20   SpO2 97%  Physical Exam Vitals and nursing note reviewed.  Constitutional:      General: She is not in acute distress.     Appearance: She is well-developed.     Comments: Vomiting clear emesis  HENT:     Head: Normocephalic and atraumatic.     Mouth/Throat:     Pharynx: No oropharyngeal exudate.  Eyes:     Conjunctiva/sclera: Conjunctivae normal.     Pupils: Pupils are equal, round, and reactive to light.  Neck:     Comments: No meningismus. Cardiovascular:     Rate and Rhythm: Normal rate and regular rhythm.     Heart sounds: Normal heart sounds. No murmur heard. Pulmonary:     Effort: Pulmonary effort is normal. No respiratory distress.     Breath sounds: Normal breath sounds.  Abdominal:     Palpations: Abdomen is soft.     Tenderness: There is abdominal tenderness. There is no guarding or rebound.  Musculoskeletal:        General: No tenderness. Normal range of motion.     Cervical back: Normal range of motion and neck supple.  Skin:    General: Skin is warm.  Neurological:     Mental Status: She is alert and oriented to person, place, and time.     Cranial Nerves: No cranial nerve deficit.     Motor: No abnormal muscle tone.     Coordination: Coordination normal.     Comments:  5/5 strength throughout. CN 2-12 intact.Equal grip strength.   Tremulous.   Psychiatric:        Behavior: Behavior normal.    ED Results / Procedures / Treatments   Labs (all labs ordered are listed, but only abnormal results are displayed) Labs Reviewed  CBC WITH DIFFERENTIAL/PLATELET - Abnormal; Notable for the following components:      Result Value   WBC 19.4 (*)    HCT 34.9 (*)    Neutro Abs 17.3 (*)    Abs Immature Granulocytes 0.12 (*)    All other components within normal limits  COMPREHENSIVE METABOLIC PANEL - Abnormal; Notable for the following components:   Chloride 96 (*)    CO2 21 (*)    Glucose, Bld 115 (*)    BUN 24 (*)    Total Protein 6.4 (*)    Anion gap 22 (*)    All other components within normal limits  ETHANOL - Abnormal; Notable for the following components:   Alcohol, Ethyl  (B) 46 (*)    All other components within normal limits  RAPID URINE DRUG SCREEN, HOSP PERFORMED - Abnormal; Notable for the following components:   Benzodiazepines POSITIVE (*)    Tetrahydrocannabinol POSITIVE (*)    All other components within normal limits  URINALYSIS, ROUTINE W REFLEX MICROSCOPIC - Abnormal; Notable for the following components:   APPearance HAZY (*)    Specific Gravity, Urine  1.040 (*)    Ketones, ur 20 (*)    Protein, ur 30 (*)    All other components within normal limits  PHOSPHORUS - Abnormal; Notable for the following components:   Phosphorus 2.0 (*)    All other components within normal limits  MAGNESIUM  LIPASE, BLOOD  TROPONIN I (HIGH SENSITIVITY)  TROPONIN I (HIGH SENSITIVITY)    EKG EKG Interpretation  Date/Time:  Thursday Mar 06 2022 18:44:01 EDT Ventricular Rate:  80 PR Interval:  125 QRS Duration: 93 QT Interval:  453 QTC Calculation: 523 R Axis:   75 Text Interpretation: Sinus rhythm Borderline repolarization abnormality Prolonged QT interval lateral ST depressions Confirmed by Ezequiel Essex [phone]) on 03/06/2022 6:52:16 PM  Radiology CT ABDOMEN PELVIS W CONTRAST  Result Date: 03/06/2022 CLINICAL DATA:  Nausea and vomiting, history of alcohol abuse EXAM: CT ABDOMEN AND PELVIS WITH CONTRAST TECHNIQUE: Multidetector CT imaging of the abdomen and pelvis was performed using the standard protocol following bolus administration of intravenous contrast. RADIATION DOSE REDUCTION: This exam was performed according to the departmental dose-optimization program which includes automated exposure control, adjustment of the mA and/or kV according to patient size and/or use of iterative reconstruction technique. CONTRAST:  56m OMNIPAQUE IOHEXOL 300 MG/ML  SOLN COMPARISON:  07/02/2021 FINDINGS: Lower chest: Hypoventilatory changes within the dependent lower lobes. No acute pleural or parenchymal lung disease. Hepatobiliary: Cholecystectomy. Hepatic  steatosis. Stable right lobe liver hemangioma. Pancreas: Unremarkable. No pancreatic ductal dilatation or surrounding inflammatory changes. Spleen: Normal in size without focal abnormality. Adrenals/Urinary Tract: Adrenal glands are unremarkable. Kidneys are normal, without renal calculi, focal lesion, or hydronephrosis. Bladder is unremarkable. Stomach/Bowel: No bowel obstruction or ileus. Normal retrocecal appendix. No bowel wall thickening or inflammatory change. Vascular/Lymphatic: Aortic atherosclerosis. No enlarged abdominal or pelvic lymph nodes. Reproductive: Uterus and bilateral adnexa are unremarkable. Other: No free fluid or free intraperitoneal gas. No abdominal wall hernia. Musculoskeletal: No acute or destructive bony lesions. Chronic L2 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 Reconstructed images demonstrate no additional findings. IMPRESSION: 1. No acute intra-abdominal or intrapelvic process. 2. Hepatic steatosis. 3.  Aortic Atherosclerosis (ICD10-I70.0). Electronically Signed   By: MRanda NgoM.D.   On: 03/06/2022 23:09    Procedures .Critical Care Performed by: REzequiel Essex MD Authorized by: REzequiel Essex MD   Critical care provider statement:    Critical care time (minutes):  35   Critical care time was exclusive of:  Separately billable procedures and treating other patients   Critical care was necessary to treat or prevent imminent or life-threatening deterioration of the following conditions: alcohol withdrawal.   Critical care was time spent personally by me on the following activities:  Development of treatment plan with patient or surrogate, discussions with consultants, evaluation of patient's response to treatment, examination of patient, ordering and review of laboratory studies, ordering and review of radiographic studies, ordering and performing treatments and interventions, pulse oximetry, re-evaluation of patient's  condition and review of old charts   I assumed direction of critical care for this patient from another provider in my specialty: no     Care discussed with: admitting provider      Medications Ordered in ED Medications  sodium chloride 0.9 % bolus 1,000 mL (has no administration in time range)  ondansetron (ZOFRAN) injection 4 mg (has no administration in time range)  LORazepam (ATIVAN) tablet 1-4 mg (has no administration in time range)    Or  LORazepam (ATIVAN) injection 1-4 mg (  has no administration in time range)  thiamine tablet 100 mg (has no administration in time range)    Or  thiamine (B-1) injection 100 mg (has no administration in time range)  folic acid (FOLVITE) tablet 1 mg (has no administration in time range)  multivitamin with minerals tablet 1 tablet (has no administration in time range)  LORazepam (ATIVAN) injection 0-4 mg (has no administration in time range)    Followed by  LORazepam (ATIVAN) injection 0-4 mg (has no administration in time range)    ED Course/ Medical Decision Making/ A&P                           Medical Decision Making Amount and/or Complexity of Data Reviewed Labs: ordered. Radiology: ordered and independent interpretation performed. Decision-making details documented in ED Course. ECG/medicine tests: ordered and independent interpretation performed. Decision-making details documented in ED Course.  Risk OTC drugs. Prescription drug management. Decision regarding hospitalization.  Nausea and vomiting since setting of suspected alcohol withdrawal.  Unknown when her last drink was.  She is tremulous but not tachycardic.  We will initiate labs, IV fluids, CIWA protocol with IV Ativan.  WBC 19. Anion gap of 22 with ketones in urine.  Still with retching and abdominal pain. Initial CIWA 18.   CT obtained given leukocytosis and persistent vomiting. This was negative for acute pathology. Results reviewed and interpreted by me.   CIWA is  improving but still tremulous and dry heaving.  Plan admission for continued treatment of intractable nausea and vomiting likely 2/2 alcohol withdrawal.   D/w Dr. Myna Hidalgo.         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

## 2022-03-06 NOTE — ED Triage Notes (Signed)
Patient BIBA from home d/t ETOH. EMS reports finding several airplane bottles of liquor beside patient. Hx alcohol abuse. Reports drinking has worsened after her daughter died 1 year ago. VS WDL.   4 mg Zofran 1000 cc LR  20 g LAC

## 2022-03-07 ENCOUNTER — Encounter (HOSPITAL_COMMUNITY): Payer: Self-pay | Admitting: Family Medicine

## 2022-03-07 DIAGNOSIS — F10939 Alcohol use, unspecified with withdrawal, unspecified: Secondary | ICD-10-CM

## 2022-03-07 DIAGNOSIS — R9431 Abnormal electrocardiogram [ECG] [EKG]: Secondary | ICD-10-CM | POA: Diagnosis present

## 2022-03-07 LAB — URINALYSIS, ROUTINE W REFLEX MICROSCOPIC
Bacteria, UA: NONE SEEN
Bilirubin Urine: NEGATIVE
Glucose, UA: NEGATIVE mg/dL
Hgb urine dipstick: NEGATIVE
Ketones, ur: 20 mg/dL — AB
Leukocytes,Ua: NEGATIVE
Nitrite: NEGATIVE
Protein, ur: 30 mg/dL — AB
Specific Gravity, Urine: 1.04 — ABNORMAL HIGH (ref 1.005–1.030)
pH: 7 (ref 5.0–8.0)

## 2022-03-07 LAB — RAPID URINE DRUG SCREEN, HOSP PERFORMED
Amphetamines: NOT DETECTED
Barbiturates: NOT DETECTED
Benzodiazepines: POSITIVE — AB
Cocaine: NOT DETECTED
Opiates: NOT DETECTED
Tetrahydrocannabinol: POSITIVE — AB

## 2022-03-07 LAB — LIPASE, BLOOD: Lipase: 23 U/L (ref 11–51)

## 2022-03-07 MED ORDER — LORAZEPAM 1 MG PO TABS: 1.0000 mg | ORAL_TABLET | ORAL | Status: DC | PRN

## 2022-03-07 MED ORDER — SODIUM CHLORIDE 0.9 % IV SOLN: INTRAVENOUS | Status: DC

## 2022-03-07 MED ORDER — CHLORDIAZEPOXIDE HCL 25 MG PO CAPS: ORAL_CAPSULE | ORAL | 0 refills | Status: AC

## 2022-03-07 MED ORDER — ACETAMINOPHEN 325 MG PO TABS: 650.0000 mg | ORAL_TABLET | Freq: Four times a day (QID) | ORAL | Status: DC | PRN

## 2022-03-07 MED ORDER — SENNOSIDES-DOCUSATE SODIUM 8.6-50 MG PO TABS: 1.0000 | ORAL_TABLET | Freq: Every evening | ORAL | Status: DC | PRN

## 2022-03-07 MED ORDER — THIAMINE HCL 100 MG PO TABS: 100.0000 mg | ORAL_TABLET | Freq: Every day | ORAL | 0 refills | Status: AC

## 2022-03-07 MED ORDER — SODIUM CHLORIDE 0.9% FLUSH: 3.0000 mL | Freq: Two times a day (BID) | INTRAVENOUS | Status: DC

## 2022-03-07 MED ORDER — CHLORDIAZEPOXIDE HCL 25 MG PO CAPS: 25.0000 mg | ORAL_CAPSULE | Freq: Three times a day (TID) | ORAL | Status: DC

## 2022-03-07 MED ORDER — MULTI-VITAMIN/MINERALS PO TABS: 1.0000 | ORAL_TABLET | Freq: Every day | ORAL | 0 refills | Status: AC

## 2022-03-07 MED ORDER — ENOXAPARIN SODIUM 40 MG/0.4ML IJ SOSY
40.0000 mg | PREFILLED_SYRINGE | INTRAMUSCULAR | Status: DC
  Administered 2022-03-07: 40 mg via SUBCUTANEOUS
  Filled 2022-03-07: qty 0.4

## 2022-03-07 MED ORDER — LORAZEPAM 2 MG/ML IJ SOLN: 1.0000 mg | INTRAMUSCULAR | Status: DC | PRN

## 2022-03-07 MED ORDER — CHLORDIAZEPOXIDE HCL 25 MG PO CAPS: 25.0000 mg | ORAL_CAPSULE | Freq: Two times a day (BID) | ORAL | Status: DC

## 2022-03-07 MED ORDER — ACETAMINOPHEN 650 MG RE SUPP
650.0000 mg | Freq: Four times a day (QID) | RECTAL | Status: DC | PRN
Start: 2022-03-07 — End: 2022-03-07

## 2022-03-07 MED ORDER — CHLORDIAZEPOXIDE HCL 25 MG PO CAPS
25.0000 mg | ORAL_CAPSULE | Freq: Four times a day (QID) | ORAL | Status: DC
Start: 2022-03-08 — End: 2022-03-07

## 2022-03-07 MED ORDER — LORAZEPAM 2 MG/ML IJ SOLN: 0.0000 mg | Freq: Two times a day (BID) | INTRAMUSCULAR | Status: DC

## 2022-03-07 MED ORDER — CHLORDIAZEPOXIDE HCL 25 MG PO CAPS: 25.0000 mg | ORAL_CAPSULE | Freq: Every day | ORAL | Status: DC

## 2022-03-07 MED ORDER — LORAZEPAM 2 MG/ML IJ SOLN
0.0000 mg | Freq: Four times a day (QID) | INTRAMUSCULAR | Status: DC
  Administered 2022-03-07: 1 mg via INTRAVENOUS
  Filled 2022-03-07: qty 1

## 2022-03-07 MED ORDER — CHLORDIAZEPOXIDE HCL 25 MG PO CAPS
50.0000 mg | ORAL_CAPSULE | Freq: Four times a day (QID) | ORAL | Status: DC
  Administered 2022-03-07: 50 mg via ORAL
  Filled 2022-03-07: qty 2

## 2022-03-07 MED ORDER — MAGNESIUM SULFATE 2 GM/50ML IV SOLN
2.0000 g | Freq: Once | INTRAVENOUS | Status: AC
  Administered 2022-03-07: 2 g via INTRAVENOUS
  Filled 2022-03-07: qty 50

## 2022-03-07 NOTE — H&P (Signed)
History and Physical    Carla Martinez DXI:[id number] DOB: [birth year] DOA: 03/06/2022  PCP: [name], MD   Patient coming from: Home   Chief Complaint: Alcohol problem, N/V     HPI: Carla Martinez is a 61 y.o. female with medical history significant for alcoholism, depression, anxiety, atrial fibrillation in September 2022 not started on anticoagulation, and history of cardiomyopathy with recovered EF on echo from November 2022, now presenting to the emergency department with nausea and vomiting.  EMS was called to the patient's residence where she was found to have waxing and waning disorientation, reported that she had been drinking and more heavily than usual in recent days, and then developed some vague abdominal discomfort with recurrent episodes of nausea and nonbloody vomiting.  Patient reported that she typically consumes 5-6 alcoholic beverages per day, had been drinking more than that recently, and has had episodes of nausea and vomiting after binges in the past but her history becomes inconsistent.  ED Course: Upon arrival to the ED, patient is found to be afebrile and saturating well on room air with stable blood pressure.  EKG features sinus rhythm with QTc 523 ms.  CT the abdomen pelvis is notable for hepatic steatosis with no acute intra-abdominal or pelvic abnormality.  Chemistry panel notable for bicarbonate of 21, anion gap 22, and elevated BUN to creatinine ratio.  CBC features a leukocytosis to 19,400.  Ethanol is elevated at 46.  Patient was given multiple doses of Ativan, Zofran, Compazine, a liter of saline, multivitamin, folate, and thiamine in the ED.  Review of Systems:  All other systems reviewed and apart from HPI, are negative.  Past Medical History:  Diagnosis Date   Anxiety    Depression    PAF (paroxysmal atrial fibrillation) ([place])    Polysubstance abuse ([place])    Takotsubo cardiomyopathy     Past Surgical History:  Procedure Laterality  Date   CHOLECYSTECTOMY     COLON RESECTION     12" removed   LEFT HEART CATH AND CORONARY ANGIOGRAPHY N/A 07/05/2021   Procedure: LEFT HEART CATH AND CORONARY ANGIOGRAPHY;  Surgeon: Andrez Grime, MD;  Location: [facility];  Service: Cardiovascular;  Laterality: N/A;   MENISCUS REPAIR Bilateral    RADIOLOGY WITH ANESTHESIA N/A 09/17/2021   Procedure: MRI LUMBAR WITH AND WITHOUT CONTRAST; MRI ABDOMEN WITH AND WITHOUT WITH ANESTHESIA;  Surgeon: Radiologist, Medication, MD;  Location: [facility];  Service: Radiology;  Laterality: N/A;    Social History:   reports that she has been smoking cigarettes. She started smoking about 18 years ago. She has a 4.25 pack-year smoking history. She has never used smokeless tobacco. She reports that she does not currently use alcohol. She reports that she does not use drugs.  No Known Allergies  Family History  Problem Relation Age of Onset   Hypertension Mother    Heart disease Mother    Stroke Mother    Leukemia Sister    Heart disease Sister    Down syndrome Son    Stroke Son    Diabetes type I Child      Prior to Admission medications   Medication Sig Start Date End Date Taking? Authorizing Provider  amitriptyline (ELAVIL) 25 MG tablet TAKE 1 TABLET BY MOUTH AT BEDTIME 01/26/22   Vanga, [name], MD  amoxicillin (AMOXIL) 500 MG tablet Take 500 mg by mouth 3 (three) times daily.    [provider]  B Complex-C (SUPER B  COMPLEX PO) Take 1 tablet by mouth daily.    [provider]  calcium carbonate (OS-CAL - DOSED IN MG OF ELEMENTAL CALCIUM) 1250 (500 Ca) MG tablet Take 1 tablet by mouth 2 (two) times daily with a meal.    [provider]  gabapentin (NEURONTIN) 100 MG capsule Take 1 capsule (100 mg total) by mouth 3 (three) times daily. Pt needs appt with PCP for further refills. 02/17/22   [name], MD  hydrOXYzine (ATARAX) 50 MG tablet TAKE 2 TABLETS BY MOUTH THREE TIMES DAILY AS NEEDED FOR ANXIETY  02/06/22   [name], MD  linaclotide [facility]) 290 MCG CAPS capsule Take 1 capsule (290 mcg total) by mouth daily before breakfast. Patient not taking: Reported on 12/04/2021 11/06/21   Lin Landsman, MD  Magnesium 250 MG TABS Take 250 mg by mouth daily.    [provider]  metoprolol succinate (TOPROL-XL) 25 MG 24 hr tablet Take 0.5 tablets (12.5 mg total) by mouth daily. 12/11/21   [name], MD  Multiple Vitamins-Minerals (MULTIVITAMIN WITH MINERALS) tablet Take 1 tablet by mouth daily. Gummy Patient not taking: Reported on 12/11/2021    [provider]  pantoprazole (PROTONIX) 40 MG tablet Take 1 tablet by mouth once daily 12/18/21   [name], MD  QUEtiapine (SEROQUEL) 100 MG tablet TAKE 1 TABLET BY MOUTH THREE TIMES DAILY 02/04/22   [name], MD  traZODone (DESYREL) 100 MG tablet TAKE 1 TABLET BY MOUTH AT BEDTIME 02/06/22   [name], MD  TURMERIC PO Take 500 mg by mouth daily. w [name]    [provider]  zolpidem (AMBIEN) 10 MG tablet TAKE 1 TABLET BY MOUTH AT BEDTIME 11/08/21   Pleas Koch, NP    Physical Exam: Vitals:   03/06/22 2130 03/06/22 2230 03/07/22 0300 03/07/22 0400  BP: 106/66 120/60 119/65 127/67  Pulse: 86 64 70 71  Resp: '15  15 15  '$ Temp:      TempSrc:      SpO2: 100% 97% 93% 94%    Constitutional: NAD, appears frail  Eyes: PERTLA, lids and conjunctivae normal ENMT: Mucous membranes are moist. Posterior pharynx clear of any exudate or lesions.   Neck: supple, no masses  Respiratory: no wheezing, no crackles. No accessory muscle use.  Cardiovascular: S1 & S2 heard, regular rate and rhythm. No extremity edema.   Abdomen: No distension, soft, no rebound or guarding. Bowel sounds active.  Musculoskeletal: no clubbing / cyanosis. No joint deformity upper and lower extremities.   Skin: no significant rashes, lesions, ulcers. Warm, dry, well-perfused. Neurologic: CN 2-12 grossly intact. Moving all  extremities. Resting tremor. Lethargic. Oriented to person, place, and situation.  Psychiatric: Calm. Cooperative.    Labs and Imaging on Admission: I have personally reviewed following labs and imaging studies  CBC: Recent Labs  Lab 03/06/22 1807  WBC 19.4*  NEUTROABS 17.3*  HGB 12.0  HCT 34.9*  MCV 84.3  PLT 790   Basic Metabolic Panel: Recent Labs  Lab 03/06/22 1807  NA 139  K 4.3  CL 96*  CO2 21*  GLUCOSE 115*  BUN 24*  CREATININE 0.77  CALCIUM 8.9  MG 1.7  PHOS 2.0*   GFR: CrCl cannot be calculated (Unknown ideal weight.). Liver Function Tests: Recent Labs  Lab 03/06/22 1807  AST 37  ALT 26  ALKPHOS 121  BILITOT 0.8  PROT 6.4*  ALBUMIN 3.6   No results for input(s): LIPASE, AMYLASE  in the last 168 hours. No results for input(s): AMMONIA in the last 168 hours. Coagulation Profile: No results for input(s): INR, PROTIME in the last 168 hours. Cardiac Enzymes: No results for input(s): CKTOTAL, CKMB, CKMBINDEX, TROPONINI in the last 168 hours. BNP (last 3 results) No results for input(s): PROBNP in the last 8760 hours. HbA1C: No results for input(s): HGBA1C in the last 72 hours. CBG: No results for input(s): GLUCAP in the last 168 hours. Lipid Profile: No results for input(s): CHOL, HDL, LDLCALC, TRIG, CHOLHDL, LDLDIRECT in the last 72 hours. Thyroid Function Tests: No results for input(s): TSH, T4TOTAL, FREET4, T3FREE, THYROIDAB in the last 72 hours. Anemia Panel: No results for input(s): VITAMINB12, FOLATE, FERRITIN, TIBC, IRON, RETICCTPCT in the last 72 hours. Urine analysis:    Component Value Date/Time   COLORURINE YELLOW (A) 08/01/2021 1606   APPEARANCEUR CLOUDY (A) 08/01/2021 1606   LABSPEC 1.006 08/01/2021 1606   PHURINE 7.0 08/01/2021 1606   GLUCOSEU NEGATIVE 08/01/2021 1606   HGBUR NEGATIVE 08/01/2021 1606   BILIRUBINUR NEGATIVE 08/01/2021 1606   BILIRUBINUR negative 02/14/2021 [place] 08/01/2021 1606   PROTEINUR  NEGATIVE 08/01/2021 1606   UROBILINOGEN 0.2 02/14/2021 1518   NITRITE NEGATIVE 08/01/2021 1606   LEUKOCYTESUR NEGATIVE 08/01/2021 1606   Sepsis Labs: '@LABRCNTIP'$ (procalcitonin:4,lacticidven:4) )No results found for this or any previous visit (from the past 240 hour(s)).   Radiological Exams on Admission: CT ABDOMEN PELVIS W CONTRAST  Result Date: 03/06/2022 CLINICAL DATA:  Nausea and vomiting, history of alcohol abuse EXAM: CT ABDOMEN AND PELVIS WITH CONTRAST TECHNIQUE: Multidetector CT imaging of the abdomen and pelvis was performed using the standard protocol following bolus administration of intravenous contrast. RADIATION DOSE REDUCTION: This exam was performed according to the departmental dose-optimization program which includes automated exposure control, adjustment of the mA and/or kV according to patient size and/or use of iterative reconstruction technique. CONTRAST:  25m OMNIPAQUE IOHEXOL 300 MG/ML  SOLN COMPARISON:  07/02/2021 FINDINGS: Lower chest: Hypoventilatory changes within the dependent lower lobes. No acute pleural or parenchymal lung disease. Hepatobiliary: Cholecystectomy. Hepatic steatosis. Stable right lobe liver hemangioma. Pancreas: Unremarkable. No pancreatic ductal dilatation or surrounding inflammatory changes. Spleen: Normal in size without focal abnormality. Adrenals/Urinary Tract: Adrenal glands are unremarkable. Kidneys are normal, without renal calculi, focal lesion, or hydronephrosis. Bladder is unremarkable. Stomach/Bowel: No bowel obstruction or ileus. Normal retrocecal appendix. No bowel wall thickening or inflammatory change. Vascular/Lymphatic: Aortic atherosclerosis. No enlarged abdominal or pelvic lymph nodes. Reproductive: Uterus and bilateral adnexa are unremarkable. Other: No free fluid or free intraperitoneal gas. No abdominal wall hernia. Musculoskeletal: No acute or destructive bony lesions. Chronic L2 compression deformity. Stable Tarlov cysts within the  lower lumbar spine and sacrum. Reconstructed images demonstrate no additional findings. IMPRESSION: 1. No acute intra-abdominal or intrapelvic process. 2. Hepatic steatosis. 3.  Aortic Atherosclerosis (ICD10-I70.0). Electronically Signed   By: MRanda NgoM.D.   On: 03/06/2022 23:09    EKG: Independently reviewed. Sinus rhythm, borderline repolarization abnormality, QTc 523 ms.   Assessment/Plan   1. Acute alcohol withdrawal  - Pt with alcohol dependence presents with tremor, anxiety, and N/V and found to be in withdrawal  - Continue to monitor with CIWA, treat with Ativan, and supplement vitamins    2. Prolonged QT interval  - QTc 523 ms in ED  - Supplement magnesium, avoid QT-prolonging medications   3. Leukocytosis  - No infectious s/s identified  - Culture if febrile   4. Depression, anxiety  - Unable  to assess on admission, patient acutely withdrawing from EtOH     DVT Prophylaxis: Lovenox  Code Status: Full  Level of Care: Level of care: Telemetry Family Communication: none present  Disposition Plan:  Patient is from: home  Anticipated d/c is to: TBD Anticipated d/c date is: 03/09/22  Patient currently: pending treatment of alcohol withdrawal  Consults called: none  Admission status: Inpatient     Vianne Bulls, MD [facility]  03/07/2022, 5:15 AM

## 2022-03-07 NOTE — Hospital Course (Signed)
Carla Martinez is Carla Martinez 61 y.o. female with medical history significant for alcoholism, depression, anxiety, atrial fibrillation in September 2022 not started on anticoagulation, and history of cardiomyopathy with recovered EF on echo from November 2022, now presenting to the emergency department with nausea and vomiting.  EMS was called to the patient's residence where she was found to have waxing and waning disorientation, reported that she had been drinking and more heavily than usual in recent days, and then developed some vague abdominal discomfort with recurrent episodes of nausea and nonbloody vomiting.  Patient reported that she typically consumes 5-6 alcoholic beverages per day, had been drinking more than that recently, and has had episodes of nausea and vomiting after binges in the past but her history becomes inconsistent.   She was admitted with alcohol withdrawal.  When I saw her this morning, she was asking to leave.  Discussed the risks of worsening withdrawal, seizures, DT's, she notes understanding.  Discharged with librium taper.  Instructed to hold QT prolonging meds until follow up with PCP.  Discussed with patient's mother.  See below for additional details

## 2022-03-07 NOTE — Progress Notes (Signed)
Substance abuse resources was attached to pt's AVS.  Arlie Solomons.Stella Encarnacion, MSW, [place]  Transitions of Care Clinical Social Worker I Direct Dial: [phone]  Fax: [phone] [name]

## 2022-03-07 NOTE — Discharge Summary (Signed)
Physician Discharge Summary  Carla Martinez KGM:[id number] DOB: [birth year] DOA: 03/06/2022  PCP: [name], MD  Admit date: 03/06/2022 Discharge date: 03/07/2022  Time spent: 40 minutes  Recommendations for Outpatient Follow-up:  Follow with outpatient CBC/CMP  Follow etoh withdrawal outpatient Encourage cessation  Holding QT prolonging meds at discharge, repeat EKG in follow up   Discharge Diagnoses:  Principal Problem:   Alcohol withdrawal ([place]) Active Problems:   Generalized anxiety disorder   Major depressive disorder, recurrent episode with anxious distress ([place])   Prolonged QT interval   Discharge Condition: stable  Diet recommendation: heart healthy  There were no vitals filed for this visit.  History of present illness:  Carla Martinez is Carla Martinez 61 y.o. female with medical history significant for alcoholism, depression, anxiety, atrial fibrillation in September 2022 not started on anticoagulation, and history of cardiomyopathy with recovered EF on echo from November 2022, now presenting to the emergency department with nausea and vomiting.  EMS was called to the patient's residence where she was found to have waxing and waning disorientation, reported that she had been drinking and more heavily than usual in recent days, and then developed some vague abdominal discomfort with recurrent episodes of nausea and nonbloody vomiting.  Patient reported that she typically consumes 5-6 alcoholic beverages per day, had been drinking more than that recently, and has had episodes of nausea and vomiting after binges in the past but her history becomes inconsistent.   She was admitted with alcohol withdrawal.  When I saw her this morning, she was asking to leave.  Discussed the risks of worsening withdrawal, seizures, DT's, she notes understanding.  Discharged with librium taper.  Instructed to hold QT prolonging meds until follow up with PCP.  Discussed with patient's  mother.  See below for additional details  Hospital Course:  Assessment and Plan:  1. Acute alcohol withdrawal  - Pt with alcohol dependence presents with tremor, anxiety, and N/V and found to be in withdrawal  - she notes distant hx seizures - she immediately asked me to leave when I saw her this morning, CIWA's in the single digits AM, but given hx of complicated withdrawal, I recommended she stay - discussed risks of discharging.  She was insistent on discharge, I prescribed librium taper for her and gave return precautions.    2. Prolonged QT interval  - QTc 523 ms in ED  - Supplement magnesium, avoid QT-prolonging medications (hold seroquel, elavil, and trazodone until outpatient follow up)   3. Leukocytosis  - no labs today, follow outpatient   4. Depression, anxiety  - follow outpatient     Procedures: none   Consultations: none  Discharge Exam: Vitals:   03/07/22 1039 03/07/22 1041  BP: 138/70   Pulse: 83   Resp: 17   Temp:  98.7 F (37.1 C)  SpO2: 100%    She's eager to leave Long discussion of risks and benefits.  I recommended staying, she has capacity and desired to leave after our discussion Discussed with mother   General: No acute distress. Cardiovascular: RRR Lungs: unlabored Abdomen: Soft, nontender, nondistended  Neurological: Alert and oriented 3. Moves all extremities 4. Cranial nerves II through XII grossly intact. Extremities: No clubbing or cyanosis. No edema.  Discharge Instructions   Discharge Instructions     Call MD for:  difficulty breathing, headache or visual disturbances   Complete by: As directed    Call MD for:  extreme fatigue   Complete by: As directed  Call MD for:  hives   Complete by: As directed    Call MD for:  persistant dizziness or light-headedness   Complete by: As directed    Call MD for:  persistant nausea and vomiting   Complete by: As directed    Call MD for:  redness, tenderness, or signs of  infection (pain, swelling, redness, odor or green/yellow discharge around incision site)   Complete by: As directed    Call MD for:  severe uncontrolled pain   Complete by: As directed    Call MD for:  temperature >100.4   Complete by: As directed    Diet - low sodium heart healthy   Complete by: As directed    Discharge instructions   Complete by: As directed    You were seen for alcohol withdrawal.  You desired to discharge from the hospital before I thought you were ready.  We'll send you home with librium (Carla Martinez benzodiazepine) to help with withdrawal symptoms.  Take this as written.  You'll taper this over the next several days.  Do NOT mix this with alcohol, it could cause dangerous respiratory depression leading to death or other medical complications.  Avoid mixing this with other sedating meds.    Alcohol cessation will be important to avoid the complications of alcoholism/alcohol abuse.  I think your medications are correct, but there was some confusion with your med list at discharge.  Please review these meds with your outpatient provider.  Please discuss the combination of seroquel, trazodone, and elavil (these are all potentially sedating meds with interactions) with your PCP.  Hold these medicines until you discuss with your outpatient doctor and can repeat an EKG and discuss whether you should take them all or change your regimen.  You had Qtc prolongation and need to follow up an EKG with your PCP to ensure you don't need to change medications.  Seroquel, trazodone, elavil can all affect QT prolongation and the doses may need to be reduced or stopped.  Hold these medications until you follow up with your PCP outpatient.  Return for new, recurrent, or worsening symptoms.  Please ask your PCP to request records from this hospitalization so they know what was done and what the next steps will be.   Increase activity slowly   Complete by: As directed       Allergies as of  03/07/2022   No Known Allergies      Medication List     STOP taking these medications    amitriptyline 25 MG tablet Commonly known as: ELAVIL   linaclotide 290 MCG Caps capsule Commonly known as: LINZESS   QUEtiapine 100 MG tablet Commonly known as: SEROQUEL   traZODone 100 MG tablet Commonly known as: DESYREL   zolpidem 10 MG tablet Commonly known as: AMBIEN       TAKE these medications    amoxicillin 500 MG tablet Commonly known as: AMOXIL Take 500 mg by mouth 3 (three) times daily.   calcium carbonate 1250 (500 Ca) MG tablet Commonly known as: OS-CAL - dosed in mg of elemental calcium Take 1 tablet by mouth 2 (two) times daily with Dyna Figuereo meal.   chlordiazePOXIDE 25 MG capsule Commonly known as: LIBRIUM Take 2 capsules (50 mg total) by mouth 4 (four) times daily for 1 day, THEN 1 capsule (25 mg total) 4 (four) times daily for 1 day, THEN 1 capsule (25 mg total) 3 (three) times daily for 2 days, THEN 1 capsule (25 mg total)  2 (two) times daily for 2 days, THEN 1 capsule (25 mg total) at bedtime for 2 days. Start taking on: Mar 07, 2022   gabapentin 100 MG capsule Commonly known as: NEURONTIN Take 1 capsule (100 mg total) by mouth 3 (three) times daily. Pt needs appt with PCP for further refills.   hydrOXYzine 50 MG tablet Commonly known as: ATARAX TAKE 2 TABLETS BY MOUTH THREE TIMES DAILY AS NEEDED FOR ANXIETY   Magnesium 250 MG Tabs Take 250 mg by mouth daily.   metoprolol succinate 25 MG 24 hr tablet Commonly known as: TOPROL-XL Take 0.5 tablets (12.5 mg total) by mouth daily.   multivitamin with minerals tablet Take 1 tablet by mouth daily. Gummy   pantoprazole 40 MG tablet Commonly known as: PROTONIX Take 1 tablet by mouth once daily   SUPER B COMPLEX PO Take 1 tablet by mouth daily.   thiamine 100 MG tablet Take 1 tablet (100 mg total) by mouth daily. Start taking on: Mar 08, 2022   TURMERIC PO Take 500 mg by mouth daily. w [name]        No Known Allergies    The results of significant diagnostics from this hospitalization (including imaging, microbiology, ancillary and laboratory) are listed below for reference.    Significant Diagnostic Studies: CT ABDOMEN PELVIS W CONTRAST  Result Date: 03/06/2022 CLINICAL DATA:  Nausea and vomiting, history of alcohol abuse EXAM: CT ABDOMEN AND PELVIS WITH CONTRAST TECHNIQUE: Multidetector CT imaging of the abdomen and pelvis was performed using the standard protocol following bolus administration of intravenous contrast. RADIATION DOSE REDUCTION: This exam was performed according to the departmental dose-optimization program which includes automated exposure control, adjustment of the mA and/or kV according to patient size and/or use of iterative reconstruction technique. CONTRAST:  16m OMNIPAQUE IOHEXOL 300 MG/ML  SOLN COMPARISON:  07/02/2021 FINDINGS: Lower chest: Hypoventilatory changes within the dependent lower lobes. No acute pleural or parenchymal lung disease. Hepatobiliary: Cholecystectomy. Hepatic steatosis. Stable right lobe liver hemangioma. Pancreas: Unremarkable. No pancreatic ductal dilatation or surrounding inflammatory changes. Spleen: Normal in size without focal abnormality. Adrenals/Urinary Tract: Adrenal glands are unremarkable. Kidneys are normal, without renal calculi, focal lesion, or hydronephrosis. Bladder is unremarkable. Stomach/Bowel: No bowel obstruction or ileus. Normal retrocecal appendix. No bowel wall thickening or inflammatory change. Vascular/Lymphatic: Aortic atherosclerosis. No enlarged abdominal or pelvic lymph nodes. Reproductive: Uterus and bilateral adnexa are unremarkable. Other: No free fluid or free intraperitoneal gas. No abdominal wall hernia. Musculoskeletal: No acute or destructive bony lesions. Chronic L2 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 Reconstructed images demonstrate no additional findings.  IMPRESSION: 1. No acute intra-abdominal or intrapelvic process. 2. Hepatic steatosis. 3.  Aortic Atherosclerosis (ICD10-I70.0). Electronically Signed   By: MRanda NgoM.D.   On: 03/06/2022 23:09    Microbiology: No results found for this or any previous visit (from the past 240 hour(s)).   Labs: Basic Metabolic Panel: Recent Labs  Lab 03/06/22 1807  NA 139  K 4.3  CL 96*  CO2 21*  GLUCOSE 115*  BUN 24*  CREATININE 0.77  CALCIUM 8.9  MG 1.7  PHOS 2.0*   Liver Function Tests: Recent Labs  Lab 03/06/22 1807  AST 37  ALT 26  ALKPHOS 121  BILITOT 0.8  PROT 6.4*  ALBUMIN 3.6   Recent Labs  Lab 03/06/22 2017  LIPASE 23   No results for input(s): AMMONIA in the last 168 hours. CBC: Recent Labs  Lab 03/06/22 1807  WBC 19.4*  NEUTROABS 17.3*  HGB 12.0  HCT 34.9*  MCV 84.3  PLT 360   Cardiac Enzymes: No results for input(s): CKTOTAL, CKMB, CKMBINDEX, TROPONINI in the last 168 hours. BNP: BNP (last 3 results) No results for input(s): BNP in the last 8760 hours.  ProBNP (last 3 results) No results for input(s): PROBNP in the last 8760 hours.  CBG: No results for input(s): GLUCAP in the last 168 hours.     Signed:  Fayrene Helper MD.  [facility] 03/07/2022, 8:06 PM

## 2022-03-10 ENCOUNTER — Ambulatory Visit (INDEPENDENT_AMBULATORY_CARE_PROVIDER_SITE_OTHER)
Admission: RE | Admit: 2022-03-10 | Discharge: 2022-03-10 | Disposition: A | Discharge status: Home / Self Care | Payer: 59 | Source: Ambulatory Visit | Attending: Family Medicine | Admitting: Family Medicine

## 2022-03-10 ENCOUNTER — Encounter: Payer: Self-pay | Admitting: Family Medicine

## 2022-03-10 ENCOUNTER — Ambulatory Visit (INDEPENDENT_AMBULATORY_CARE_PROVIDER_SITE_OTHER): Payer: 59 | Admitting: Family Medicine

## 2022-03-10 VITALS — BP 98/60 | HR 79 | Temp 97.5°F | Ht 63.0 in

## 2022-03-10 DIAGNOSIS — G4700 Insomnia, unspecified: Secondary | ICD-10-CM

## 2022-03-10 DIAGNOSIS — F1090 Alcohol use, unspecified, uncomplicated: Secondary | ICD-10-CM | POA: Diagnosis not present

## 2022-03-10 DIAGNOSIS — F10221 Alcohol dependence with intoxication delirium: Secondary | ICD-10-CM | POA: Insufficient documentation

## 2022-03-10 DIAGNOSIS — R9431 Abnormal electrocardiogram [ECG] [EKG]: Secondary | ICD-10-CM | POA: Diagnosis not present

## 2022-03-10 DIAGNOSIS — F411 Generalized anxiety disorder: Secondary | ICD-10-CM

## 2022-03-10 DIAGNOSIS — R0781 Pleurodynia: Secondary | ICD-10-CM | POA: Diagnosis not present

## 2022-03-10 DIAGNOSIS — F419 Anxiety disorder, unspecified: Secondary | ICD-10-CM

## 2022-03-10 DIAGNOSIS — F10930 Alcohol use, unspecified with withdrawal, uncomplicated: Secondary | ICD-10-CM | POA: Diagnosis not present

## 2022-03-10 DIAGNOSIS — R0789 Other chest pain: Secondary | ICD-10-CM | POA: Insufficient documentation

## 2022-03-10 DIAGNOSIS — Z87898 Personal history of other specified conditions: Secondary | ICD-10-CM | POA: Diagnosis not present

## 2022-03-10 DIAGNOSIS — D72829 Elevated white blood cell count, unspecified: Secondary | ICD-10-CM

## 2022-03-10 DIAGNOSIS — F339 Major depressive disorder, recurrent, unspecified: Secondary | ICD-10-CM

## 2022-03-10 MED ORDER — MELOXICAM 7.5 MG PO TABS: 7.5000 mg | ORAL_TABLET | Freq: Every day | ORAL | 0 refills | Status: DC

## 2022-03-10 MED ORDER — QUETIAPINE FUMARATE 100 MG PO TABS: 300.0000 mg | ORAL_TABLET | Freq: Every day | ORAL | 0 refills | Status: DC

## 2022-03-10 NOTE — Assessment & Plan Note (Signed)
On several QT C prolonging medications, with recurrence and several hospitalizations.  Current dose of trazodone is likely not helping her with sleep.  We will stop trazodone and switch Seroquel to nighttime.  Referral to psych in number provided.  Have instructed patient on multiple occasions that I think she needs and would benefit from psychiatric support and medication management.  Follow-up 1 month.

## 2022-03-10 NOTE — Assessment & Plan Note (Signed)
Discussed switching Seroquel from 100 mg 3 times a day to 300 mg at night.  Information provided for psychiatry, advised calling to schedule an appointment.  She does note that this is the 1 year anniversary of her daughter's death and feels that her drinking is largely due to increased mood changes.

## 2022-03-10 NOTE — Patient Instructions (Addendum)
Psychiatry Support  Dr Chucky May [address], [address] [place], [place] [address] Phone: [phone]  Schedule appointment with Psychiatry as soon as possible  Call to follow-up with GI's office  Change Seroquel to 300 mg at night  Stop Trazodone  Continue Amitriptyline    I will send a mychart with alcohol use resources    Consider contacting a professional therapist  -- Carla Martinez is one option. Call [phone] -- Or you can check out [email or web address] -- you can read bios of therapists and see if they accept insurance -- Check with your insurance to see if you have coverage and who may take your insurance

## 2022-03-10 NOTE — Assessment & Plan Note (Signed)
Resolution. Will still stop trazodone and switch seroquel to bedtime.

## 2022-03-10 NOTE — Assessment & Plan Note (Signed)
Poorly controlled, secondary to 1 year anniversary of her daughter's death.  Triggering significant alcohol use.  I have been trying to get her to see psychiatry for almost an entire year if not longer.  Number for psychiatrist provided today.  This is never been well controlled.  Appreciate psychiatry support.

## 2022-03-10 NOTE — Assessment & Plan Note (Signed)
This is the first time patient has endorsed her long history with alcohol use disorder including several rounds of rehab.  She declines rehab at this time.  She has not sought out AA but her desire is to stop alcohol consumption.  No alcohol since her hospitalization. Number for therapy and finding a local AA group provided -she reports this was what was most successful in the past.

## 2022-03-10 NOTE — Progress Notes (Signed)
Subjective:     Carla Martinez is a 61 y.o. female presenting for Hospitalization Follow-up (Fall, states that she has broken ribs because she knows how it feels)     HPI  #Fall - not sure - a few days ago, before the hospital stay - was in pain that day - but they did not do any imaging - told them she thought she had a broken rib - using ibuprofen occasionally but no help  #Alcohol withdrawal - has not had any alcohol since she left the hospital - does not want to drink alcohol again - does not want to go to rehab - did this several  - did go to AA in the past 2-3 times a day - was in therapy as well  #prolonged qtc - likely 2/2 meds - she did not stop as instructed  #Positive UDS - last THC in 1 week - still feels cloudy head, but thinks this may be her rib pain   Review of Systems  5/18-5/19/2023: Alcohol withdrawal - left against medical advice with librium taper. Prolonged qtc - magnesium supplement (hold seroquel, elavil, and trazodone) - needs EKG and cbc/cmp  Social History   Tobacco Use  Smoking Status Every Day   Packs/day: 0.25   Years: 17.00   Pack years: 4.25   Types: Cigarettes   Start date: 11/25/2003  Smokeless Tobacco Never  Tobacco Comments   used to smoke 1PPD but has decreased. Down to 3  cigarettes daily        Objective:    BP Readings from Last 3 Encounters:  03/10/22 98/60  03/07/22 138/70  12/11/21 (!) 82/60   Wt Readings from Last 3 Encounters:  12/11/21 96 lb 1 oz (43.6 kg)  12/04/21 95 lb 4 oz (43.2 kg)  09/17/21 90 lb (40.8 kg)    BP 98/60   Pulse 79   Temp (!) 97.5 F (36.4 C) (Temporal)   Ht '5\' 3"'$  (1.6 m)   SpO2 94%   BMI 16.47 kg/m    Physical Exam Constitutional:      General: She is not in acute distress.    Appearance: She is well-developed. She is not diaphoretic.     Comments: Laying flat on exam table.   HENT:     Right Ear: External ear normal.     Left Ear: External ear normal.      Nose: Nose normal.  Eyes:     Conjunctiva/sclera: Conjunctivae normal.  Cardiovascular:     Rate and Rhythm: Normal rate and regular rhythm.     Heart sounds: No murmur heard. Pulmonary:     Effort: Pulmonary effort is normal. No respiratory distress.     Breath sounds: Normal breath sounds. No wheezing.  Chest:     Chest wall: Tenderness (right lower ribs) present. No crepitus.  Musculoskeletal:     Cervical back: Neck supple.  Skin:    General: Skin is warm and dry.     Capillary Refill: Capillary refill takes less than 2 seconds.  Neurological:     Mental Status: She is alert. Mental status is at baseline.  Psychiatric:        Mood and Affect: Mood normal.        Behavior: Behavior normal.     EKG: QTC 424, no ST changes. NSR, 1 PAC  CXR (my read): lungs clear, appears to have a right rib fracture     Assessment & Plan:   Problem List Items  Addressed This Visit       Other   Insomnia disorder    On several QT C prolonging medications, with recurrence and several hospitalizations.  Current dose of trazodone is likely not helping her with sleep.  We will stop trazodone and switch Seroquel to nighttime.  Referral to psych in number provided.  Have instructed patient on multiple occasions that I think she needs and would benefit from psychiatric support and medication management.  Follow-up 1 month.       Generalized anxiety disorder    Discussed switching Seroquel from 100 mg 3 times a day to 300 mg at night.  Information provided for psychiatry, advised calling to schedule an appointment.  She does note that this is the 1 year anniversary of her daughter's death and feels that her drinking is largely due to increased mood changes.       Relevant Orders   Ambulatory referral to Psychology   Major depressive disorder, recurrent episode with anxious distress ([place])    Poorly controlled, secondary to 1 year anniversary of her daughter's death.  Triggering significant  alcohol use.  I have been trying to get her to see psychiatry for almost an entire year if not longer.  Number for psychiatrist provided today.  This is never been well controlled.  Appreciate psychiatry support.       Relevant Orders   Ambulatory referral to Psychology   History of prolonged Q-T interval on ECG    Resolution. Will still stop trazodone and switch seroquel to bedtime.        Relevant Orders   EKG 12-Lead (Completed)   Alcohol withdrawal ([place])    No longer suffering from withdrawal, however abnormal labs in the hospital we will repeat these.       Relevant Orders   Comprehensive metabolic panel   CBC with Differential   Alcohol use disorder - Primary    This is the first time patient has endorsed her long history with alcohol use disorder including several rounds of rehab.  She declines rehab at this time.  She has not sought out AA but her desire is to stop alcohol consumption.  No alcohol since her hospitalization. Number for therapy and finding a local AA group provided -she reports this was what was most successful in the past.       Relevant Orders   Comprehensive metabolic panel   CBC with Differential   Ambulatory referral to Psychology   Rib pain on right side    Poor historian, secondary to alcohol use as well as withdrawing.  She says that there was a fall a few days ago.  She thinks it was before her hospital stay.  No imaging was done at that time.  X-ray today we will follow-up final read.  Meloxicam.  She Clines lidocaine patches.  Recent UDS with marijuana, benzos (though unclear if this was administered in the hospital prior to urine).  As well as alcohol use history we will not use opiates for this patient.       Relevant Medications   meloxicam (MOBIC) 7.5 MG tablet   Other Relevant Orders   DG Chest 2 View   Other Visit Diagnoses     Anxiety  (Chronic)      Relevant Medications   QUEtiapine (SEROQUEL) 100 MG tablet   Leukocytosis,  unspecified type          I spent 60 minutes with pt , obtaining history, examining, reviewing chart, documenting encounter  and discussing the above plan of care.    Return in about 4 weeks (around 04/07/2022) for for check in.  [name], MD

## 2022-03-10 NOTE — Assessment & Plan Note (Signed)
Poor historian, secondary to alcohol use as well as withdrawing.  She says that there was a fall a few days ago.  She thinks it was before her hospital stay.  No imaging was done at that time.  X-ray today we will follow-up final read.  Meloxicam.  She Clines lidocaine patches.  Recent UDS with marijuana, benzos (though unclear if this was administered in the hospital prior to urine).  As well as alcohol use history we will not use opiates for this patient.

## 2022-03-10 NOTE — Assessment & Plan Note (Signed)
No longer suffering from withdrawal, however abnormal labs in the hospital we will repeat these.

## 2022-03-11 ENCOUNTER — Telehealth: Payer: Self-pay | Admitting: Family Medicine

## 2022-03-11 LAB — COMPREHENSIVE METABOLIC PANEL
ALT: 16 U/L (ref 0–35)
AST: 20 U/L (ref 0–37)
Albumin: 3.8 g/dL (ref 3.5–5.2)
Alkaline Phosphatase: 108 U/L (ref 39–117)
BUN: 19 mg/dL (ref 6–23)
CO2: 31 mEq/L (ref 19–32)
Calcium: 9.5 mg/dL (ref 8.4–10.5)
Chloride: 98 mEq/L (ref 96–112)
Creatinine, Ser: 0.95 mg/dL (ref 0.40–1.20)
GFR: 66.07 mL/min (ref >60.00)
Glucose, Bld: 120 mg/dL — ABNORMAL HIGH (ref 70–99)
Potassium: 3.4 mEq/L — ABNORMAL LOW (ref 3.5–5.1)
Sodium: 137 mEq/L (ref 135–145)
Total Bilirubin: 0.2 mg/dL (ref 0.2–1.2)
Total Protein: 6.3 g/dL (ref 6.0–8.3)

## 2022-03-11 LAB — CBC WITH DIFFERENTIAL/PLATELET
Basophils Absolute: 0.1 10*3/uL (ref 0.0–0.1)
Basophils Relative: 1 % (ref 0.0–3.0)
Eosinophils Absolute: 0.2 10*3/uL (ref 0.0–0.7)
Eosinophils Relative: 2.2 % (ref 0.0–5.0)
HCT: 36.6 % (ref 36.0–46.0)
Hemoglobin: 12.1 g/dL (ref 12.0–15.0)
Lymphocytes Relative: 30.1 % (ref 12.0–46.0)
Lymphs Abs: 2.1 10*3/uL (ref 0.7–4.0)
MCHC: 33 g/dL (ref 30.0–36.0)
MCV: 88 fl (ref 78.0–100.0)
Monocytes Absolute: 0.4 10*3/uL (ref 0.1–1.0)
Monocytes Relative: 5.7 % (ref 3.0–12.0)
Neutro Abs: 4.2 10*3/uL (ref 1.4–7.7)
Neutrophils Relative %: 61 % (ref 43.0–77.0)
Platelets: 268 10*3/uL (ref 150.0–400.0)
RBC: 4.16 Mil/uL (ref 3.87–5.11)
RDW: 13.6 % (ref 11.5–15.5)
WBC: 7 10*3/uL (ref 4.0–10.5)

## 2022-03-11 NOTE — Telephone Encounter (Signed)
Pt called and needs a "medication prescribed for her broken rib and toe pain. She wants to speak more with the nurse about this to see what Dr. Einar Pheasant would suggest." Pt was last seen by Dr. Einar Pheasant on 03/10/2022.  Please return a call when possible. Callback Number: [phone]

## 2022-03-11 NOTE — Telephone Encounter (Signed)
Please let pt know that her XR came back and does not show a broken rib.   She was prescribed Meloxicam 7.'5mg'$  for her pain.   If not working, she can try taking 2 of these.   She can also take Tylenol 1000 mg twice daily.

## 2022-03-11 NOTE — Telephone Encounter (Signed)
Mychart message sent to pt relaying Dr. Verda Cumins message.

## 2022-03-13 ENCOUNTER — Other Ambulatory Visit: Payer: Self-pay | Admitting: Gastroenterology

## 2022-03-13 ENCOUNTER — Ambulatory Visit (INDEPENDENT_AMBULATORY_CARE_PROVIDER_SITE_OTHER): Payer: 59 | Admitting: Family Medicine

## 2022-03-13 VITALS — BP 110/70 | HR 85 | Temp 97.2°F | Wt 95.9 lb

## 2022-03-13 DIAGNOSIS — R0789 Other chest pain: Secondary | ICD-10-CM

## 2022-03-13 DIAGNOSIS — I509 Heart failure, unspecified: Secondary | ICD-10-CM | POA: Diagnosis not present

## 2022-03-13 DIAGNOSIS — F1090 Alcohol use, unspecified, uncomplicated: Secondary | ICD-10-CM

## 2022-03-13 DIAGNOSIS — S99922A Unspecified injury of left foot, initial encounter: Secondary | ICD-10-CM | POA: Diagnosis not present

## 2022-03-13 DIAGNOSIS — Z634 Disappearance and death of family member: Secondary | ICD-10-CM

## 2022-03-13 NOTE — Assessment & Plan Note (Signed)
Suspect she may have a fracture of the third toe.  Offered x-ray given pain on the metatarsal area, but she declined.  Advised continued meloxicam and buddy taping for comfort.

## 2022-03-13 NOTE — Assessment & Plan Note (Signed)
She endorses continued sobriety.

## 2022-03-13 NOTE — Assessment & Plan Note (Signed)
She notes minimal improvement with meloxicam.  Advised continuing this as well as Tylenol.  Discussed given her other medications on her history do not feel opiates would be appropriate.  Also x-ray without any fracture.

## 2022-03-13 NOTE — Progress Notes (Signed)
Subjective:     Carla Martinez is a 61 y.o. female presenting for Follow-up (Pain and psychiatrist referral )     HPI  #Chest pain - since a fall - mom said that she fell - xr no fracture - breathing - endorses some SOB  - walking - typically at home with her son, does not walk much at home  - endorses runny nose - thinks this is allergies, sneezing - no cough   #toe pain - not sure what this started with   #Stress - smoking 1 ppd  - sob at baseline - does not feel this is   was   Review of Systems   Social History   Tobacco Use  Smoking Status Every Day   Packs/day: 0.25   Years: 17.00   Pack years: 4.25   Types: Cigarettes   Start date: 11/25/2003  Smokeless Tobacco Never  Tobacco Comments   used to smoke 1PPD but has decreased. Down to 3  cigarettes daily        Objective:    BP Readings from Last 3 Encounters:  03/13/22 110/70  03/10/22 98/60  03/07/22 138/70   Wt Readings from Last 3 Encounters:  03/13/22 95 lb 14.4 oz (43.5 kg)  12/16/21 93 lb (42.2 kg)  12/11/21 96 lb 1 oz (43.6 kg)    BP 110/70 (BP Location: Left Arm, Cuff Size: Small)   Pulse 85   Temp (!) 97.2 F (36.2 C) (Temporal)   Wt 95 lb 14.4 oz (43.5 kg) Comment: pt refused to weigh  SpO2 95%   BMI 16.99 kg/m    Physical Exam Constitutional:      General: She is not in acute distress.    Appearance: She is well-developed. She is not diaphoretic.     Comments: Thin   HENT:     Right Ear: External ear normal.     Left Ear: External ear normal.     Nose: Nose normal.  Eyes:     Conjunctiva/sclera: Conjunctivae normal.  Cardiovascular:     Rate and Rhythm: Normal rate and regular rhythm.  Pulmonary:     Effort: Pulmonary effort is normal. No respiratory distress.     Breath sounds: Normal breath sounds. No wheezing or rales.  Musculoskeletal:     Cervical back: Neck supple.     Comments: Left foot - bruising to the 2-4th metatarsal and toes with the greats  on the 3rd digit. TTP worse on the third toe with some along the metatarsal.   Skin:    General: Skin is warm and dry.     Capillary Refill: Capillary refill takes less than 2 seconds.  Neurological:     Mental Status: She is alert. Mental status is at baseline.  Psychiatric:        Mood and Affect: Mood normal.        Behavior: Behavior normal.          Assessment & Plan:   Problem List Items Addressed This Visit       Cardiovascular and Mediastinum   CHF (congestive heart failure) ([place])    Chest x-ray earlier this week with possible effusion.  Patient notes breathing is at baseline.  Most recent echo with return to normal function.  Appears to be euvolemic on exam today.  Continue metoprolol 12.5 mg daily.  Monitoring for worsening breathing.         Other   Bereavement    It is the 1  year anniversary of the death of her daughter.  She is experiencing more anxiety.  She does have an appointment with a psychiatrist on the 6.  I previously placed a referral to therapy.  Of note she says that she has been smoking more to deal with her anxiety, but no plans to continue this.  She also notes that she has gone 2 weeks without drinking.       Alcohol use disorder    She endorses continued sobriety.       Right-sided chest wall pain - Primary    She notes minimal improvement with meloxicam.  Advised continuing this as well as Tylenol.  Discussed given her other medications on her history do not feel opiates would be appropriate.  Also x-ray without any fracture.       Injury of left toe    Suspect she may have a fracture of the third toe.  Offered x-ray given pain on the metatarsal area, but she declined.  Advised continued meloxicam and buddy taping for comfort.         Return if symptoms worsen or fail to improve.  [name], MD

## 2022-03-13 NOTE — Patient Instructions (Addendum)
03/25/2022 11:00 AM Nwoko, Terese Door, PA [facility]   Phone: [phone]  Address: [address], [place]   Pain - Continue Meloxicam 7.5 mg - take 2 daily - Take Tylenol 1000 mg twice daily - Let me know if you change your mind about a foot x-ray - buddy tape your toes - this can help with pain  Call if you develop - cough, worsening shortness of breath, fevers  Call the GI office

## 2022-03-13 NOTE — Assessment & Plan Note (Signed)
Chest x-ray earlier this week with possible effusion.  Patient notes breathing is at baseline.  Most recent echo with return to normal function.  Appears to be euvolemic on exam today.  Continue metoprolol 12.5 mg daily.  Monitoring for worsening breathing.

## 2022-03-13 NOTE — Assessment & Plan Note (Signed)
It is the 1 year anniversary of the death of her daughter.  She is experiencing more anxiety.  She does have an appointment with a psychiatrist on the 6.  I previously placed a referral to therapy.  Of note she says that she has been smoking more to deal with her anxiety, but no plans to continue this.  She also notes that she has gone 2 weeks without drinking.

## 2022-03-25 ENCOUNTER — Encounter (HOSPITAL_COMMUNITY): Payer: Self-pay | Admitting: Physician Assistant

## 2022-03-25 ENCOUNTER — Ambulatory Visit (HOSPITAL_COMMUNITY): Payer: 59 | Admitting: Physician Assistant

## 2022-03-25 VITALS — BP 109/69 | HR 70 | Ht 62.0 in | Wt 98.0 lb

## 2022-03-25 DIAGNOSIS — F330 Major depressive disorder, recurrent, mild: Secondary | ICD-10-CM

## 2022-03-25 DIAGNOSIS — F419 Anxiety disorder, unspecified: Secondary | ICD-10-CM

## 2022-03-25 DIAGNOSIS — G4700 Insomnia, unspecified: Secondary | ICD-10-CM

## 2022-03-25 DIAGNOSIS — Z634 Disappearance and death of family member: Secondary | ICD-10-CM

## 2022-03-25 DIAGNOSIS — F1021 Alcohol dependence, in remission: Secondary | ICD-10-CM | POA: Diagnosis not present

## 2022-03-25 DIAGNOSIS — F5000 Anorexia nervosa, unspecified: Secondary | ICD-10-CM | POA: Diagnosis not present

## 2022-03-25 MED ORDER — GABAPENTIN 100 MG PO CAPS: 200.0000 mg | ORAL_CAPSULE | Freq: Three times a day (TID) | ORAL | 2 refills | Status: DC

## 2022-03-25 MED ORDER — HYDROXYZINE HCL 50 MG PO TABS: 50.0000 mg | ORAL_TABLET | Freq: Three times a day (TID) | ORAL | 1 refills | Status: DC | PRN

## 2022-03-25 MED ORDER — QUETIAPINE FUMARATE 300 MG PO TABS: 300.0000 mg | ORAL_TABLET | Freq: Every day | ORAL | 2 refills | Status: DC

## 2022-03-25 MED ORDER — AMITRIPTYLINE HCL 50 MG PO TABS: 50.0000 mg | ORAL_TABLET | Freq: Every day | ORAL | 2 refills | Status: DC

## 2022-03-25 NOTE — Progress Notes (Addendum)
Psychiatric Initial Adult Assessment   Patient Identification: Carla Martinez MRN:  [id number] Date of Evaluation:  03/28/2022 Referral Source: Referred by physician Chief Complaint:   Chief Complaint  Patient presents with   New Patient (Initial Visit)    in person, new pt eval. referral from PCP   Visit Diagnosis:    ICD-10-CM   1. Mild recurrent major depression ([place])  F33.0 amitriptyline (ELAVIL) 50 MG tablet    hydrOXYzine (ATARAX) 50 MG tablet    QUEtiapine (SEROQUEL) 300 MG tablet    2. Anorexia nervosa in remission  F50.00 hydrOXYzine (ATARAX) 50 MG tablet    3. Alcohol use disorder, severe, in early remission ([place])  F10.21     4. Bereavement  Z63.4     5. Anxiety  F41.9 gabapentin (NEURONTIN) 100 MG capsule    QUEtiapine (SEROQUEL) 300 MG tablet    6. Insomnia, unspecified type  G47.00 gabapentin (NEURONTIN) 100 MG capsule      History of Present Illness:    Carla Martinez "Carla Martinez" is a 61 year old female with a past psychiatric history significant for anorexia, depression, anxiety, PTSD, and alcohol use disorder who presents to [facility] to establish psychiatric care and for medication management.  Patient states that she moved from [place] and has no established psychiatrist.  Patient presents today stating that the passing of her eldest daughter has completely destroyed her.  Due to her daughter's passing, she states that she has been drinking again.  Patient is currently divorced and is the sole caretaker of her son who is currently nonverbal and incapacitated.  Patient endorses experiencing depression and anxiety that she describes are through the roof.  In addition to her depression and anxiety, patient endorses panic attacks and crying spells.  Patient endorses experiencing depression every day, especially at night.  Patient depressive episodes are characterized by the following symptoms: Sadness, crying spells,  difficulty getting out of bed, decreased concentration, lack of energy, lack of motivation, procrastination, feelings of guilt/worthlessness, and hopelessness.  Patient reports that the only reason why she is able to get out of bed because she has to take care of her son.  Patient endorses anxiety she rates a 10 out of 10.  Patient also experiences panic attacks once a week characterized by the following symptoms: agitation difficulty breathing, and feeling out of control.  Patient endorses having a bad relationship with her ex-husband and states that her ex-husband blames her for the death of their daughter.  She also states that her ex-husband stated that she should have been the one that died that day.  Patient reports that she was hospitalized in September and subsequently hospitalized a month later.  Patient reports that she was recently seen in the ED due to heart problems attributed to severe grief.  Patient reports that her medications have been helpful, especially the use of Seroquel.  Patient is currently taking the following medications: Gabapentin 100 mg 3 times daily, Seroquel 300 mg at bedtime, and amitriptyline.  Patient states that she is unsure if her gabapentin has been helpful.  Patient states that she was admitted to the ED and subsequently transferred over to [facility] for a week.  Patient denies a past history of suicide attempts.  She endorses several incidences of self-harm in the past characterized by excoriations.  A PHQ-9 screen was performed with the patient scoring a 21.  A GAD-7 screen was also performed with the patient scoring a 17.  Patient  is alert and oriented x4, calm, cooperative, and fully engaged in conversation during the encounter.  Patient denies suicidal ideations and reiterates that she would never commit suicide.  Patient denies homicidal ideations.  She further denies auditory or visual hallucinations and does not appear to be responding to internal/external  stimuli.  Patient endorses fair sleep and receives on average 5 to 6 hours of sleep each night.  Patient endorses decreased appetite and eats on average 1 meal per day.  Patient denies alcohol consumption and states that she has not drank alcohol since May 18th.  Patient endorses tobacco use and states that her use of tobacco products has increased.  Patient denies illicit drug use.  Associated Signs/Symptoms: Depression Symptoms:  depressed mood, anhedonia, psychomotor agitation, psychomotor retardation, fatigue, feelings of worthlessness/guilt, difficulty concentrating, hopelessness, impaired memory, recurrent thoughts of death, anxiety, panic attacks, loss of energy/fatigue, disturbed sleep, weight gain, decreased labido, decreased appetite, (Hypo) Manic Symptoms:  Distractibility, Flight of Ideas, Impulsivity, Anxiety Symptoms:  Excessive Worry, Panic Symptoms, Obsessive Compulsive Symptoms:   Patient states that she has to have numbers be even as well as things organized a certain way. Patient denies major issues involving OCD, Social Anxiety, Specific Phobias, Psychotic Symptoms:   None PTSD Symptoms: Had a traumatic exposure:  Patient reports that there other traumatic moments in her life but she does not want to talk about it. She does states that she has been diagnosed with PTSD. Had a traumatic exposure in the last month:  N/A Re-experiencing:  Flashbacks Intrusive Thoughts Nightmares Hypervigilance:  No Hyperarousal:  Difficulty Concentrating Avoidance:  Decreased Interest/Participation Foreshortened Future  Past Psychiatric History:  Anorexia Depression Anxiety PTSD Alcohol use disorder  Previous Psychotropic Medications: Yes   Substance Abuse History in the last 12 months:  No.  Consequences of Substance Abuse: Negative  Past Medical History:  Past Medical History:  Diagnosis Date   Anxiety    Depression    PAF (paroxysmal atrial fibrillation)  ([place])    Polysubstance abuse ([place])    Takotsubo cardiomyopathy     Past Surgical History:  Procedure Laterality Date   CHOLECYSTECTOMY     COLON RESECTION     12" removed   LEFT HEART CATH AND CORONARY ANGIOGRAPHY N/A 07/05/2021   Procedure: LEFT HEART CATH AND CORONARY ANGIOGRAPHY;  Surgeon: Andrez Grime, MD;  Location: [facility];  Service: Cardiovascular;  Laterality: N/A;   MENISCUS REPAIR Bilateral    RADIOLOGY WITH ANESTHESIA N/A 09/17/2021   Procedure: MRI LUMBAR WITH AND WITHOUT CONTRAST; MRI ABDOMEN WITH AND WITHOUT WITH ANESTHESIA;  Surgeon: Radiologist, Medication, MD;  Location: [facility];  Service: Radiology;  Laterality: N/A;    Family Psychiatric History:  Father - Depression  Family History:  Family History  Problem Relation Age of Onset   Hypertension Mother    Heart disease Mother    Stroke Mother    Leukemia Sister    Heart disease Sister    Down syndrome Son    Stroke Son    Diabetes type I Child     Social History:   Social History   Socioeconomic History   Marital status: Single    Spouse name: Not on file   Number of children: 6   Years of education: some college   Highest education level: Not on file  Occupational History   Not on file  Tobacco Use   Smoking status: Every Day    Packs/day: 0.25    Years: 17.00  Total pack years: 4.25    Types: Cigarettes    Start date: 11/25/2003   Smokeless tobacco: Never   Tobacco comments:    used to smoke 1PPD but has decreased. Down to 3  cigarettes daily  Vaping Use   Vaping Use: Never used  Substance and Sexual Activity   Alcohol use: Not Currently   Drug use: Never   Sexual activity: Not Currently  Other Topics Concern   Not on file  Social History Narrative   02/14/21   From: moved to [place] 2020 to be near family   Living: with mom and son who is dependent adult son with Down syndrome   Work: care giver      Family: 6 children - [name] lives with her, Surveyor, minerals (2002), - 4  living grandchildren      Enjoys: Training and development officer, Medical laboratory scientific officer      Exercise: use to exercise non-stop   Diet: good, today ate 2 hard boiled eggs, 1 meal and few snacks      Safety   Seat belts: Yes    Guns: No   Safe in relationships: Yes    Social Determinants of Radio broadcast assistant Strain: Not on file  Food Insecurity: Not on file  Transportation Needs: Not on file  Physical Activity: Not on file  Stress: Not on file  Social Connections: Not on file    Additional Social History:  Patient is currently not working.  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  Patient endorses both housing and transportation.  Patient endorses some social support but is receiving therapy.  Allergies:  No Known Allergies  Metabolic Disorder Labs: No results found for: "HGBA1C", "MPG" No results found for: "PROLACTIN" No results found for: "CHOL", "TRIG", "HDL", "CHOLHDL", "VLDL", "LDLCALC" Lab Results  Component Value Date   TSH 3.03 07/24/2021    Therapeutic Level Labs: No results found for: "LITHIUM" No results found for: "CBMZ" No results found for: "VALPROATE"  Current Medications: Current Outpatient Medications  Medication Sig Dispense Refill   amoxicillin (AMOXIL) 500 MG tablet Take 500 mg by mouth 3 (three) times daily.     B Complex-C (SUPER B COMPLEX PO) Take 1 tablet by mouth daily.     calcium carbonate (OS-CAL - DOSED IN MG OF ELEMENTAL CALCIUM) 1250 (500 Ca) MG tablet Take 1 tablet by mouth 2 (two) times daily with a meal.     Magnesium 250 MG TABS Take 250 mg by mouth daily.     meloxicam (MOBIC) 7.5 MG tablet Take 1 tablet (7.5 mg total) by mouth daily. 30 tablet 0   metoprolol succinate (TOPROL-XL) 25 MG 24 hr tablet Take 0.5 tablets (12.5 mg total) by mouth daily. 45 tablet 3   Multiple Vitamins-Minerals (MULTIVITAMIN WITH MINERALS) tablet Take 1 tablet by mouth daily. Gummy 30 tablet 0   pantoprazole (PROTONIX) 40 MG tablet Take 1 tablet by mouth once daily 90 tablet  1   thiamine 100 MG tablet Take 1 tablet (100 mg total) by mouth daily. 30 tablet 0   TURMERIC PO Take 500 mg by mouth daily. w [name]     amitriptyline (ELAVIL) 50 MG tablet Take 1 tablet (50 mg total) by mouth at bedtime. 30 tablet 2   gabapentin (NEURONTIN) 100 MG capsule Take 2 capsules (200 mg total) by mouth 3 (three) times daily. Pt needs appt with PCP for further refills. 180 capsule 2   hydrOXYzine (ATARAX) 50 MG tablet Take 1 tablet (50 mg  total) by mouth 3 (three) times daily as needed. 90 tablet 1   QUEtiapine (SEROQUEL) 300 MG tablet Take 1 tablet (300 mg total) by mouth at bedtime. 30 tablet 2   No current facility-administered medications for this visit.    Musculoskeletal: Strength & Muscle Tone: within normal limits Gait & Station: normal Patient leans: N/A  Psychiatric Specialty Exam: Review of Systems  Psychiatric/Behavioral:  Positive for decreased concentration, dysphoric mood and sleep disturbance. Negative for hallucinations, self-injury and suicidal ideas. The patient is nervous/anxious. The patient is not hyperactive.     Blood pressure 109/69, pulse 70, height '5\' 2"'$  (1.575 m), weight 98 lb (44.5 kg).Body mass index is 17.92 kg/m.  General Appearance: Casual  Eye Contact:  Good  Speech:  Clear and Coherent and Normal Rate  Volume:  Normal  Mood:  Anxious and Depressed  Affect:  Congruent and Depressed  Thought Process:  Coherent, Goal Directed, and Descriptions of Associations: Intact  Orientation:  Full (Time, Place, and Person)  Thought Content:  WDL  Suicidal Thoughts:  No  Homicidal Thoughts:  No  Memory:  Immediate;   Good Recent;   Good Remote;   Good  Judgement:  Good  Insight:  Good  Psychomotor Activity:  Normal  Concentration:  Concentration: Good and Attention Span: Good  Recall:  Good  Fund of Knowledge:Good  Language: Good  Akathisia:  No  Handed:  Right  AIMS (if indicated):  not done  Assets:  Communication Skills Desire for  Improvement Housing Transportation  ADL's:  Intact  Cognition: WNL  Sleep:  Fair   Screenings: GAD-7    Personnel officer Visit from 03/25/2022 in [facility] Office Visit from 05/09/2021 in [facility] at [facility] Visit from 08/17/2020 in [facility] at [place]  Total GAD-7 Score '17 12 15      '$ [employer]    [facility] Office Visit from 03/25/2022 in [facility] Office Visit from 09/04/2021 in [facility] Office Visit from 05/09/2021 in [facility] at [place] Visit from 02/14/2021 in [facility] Visit from 08/17/2020 in [facility] at [facility]  PHQ-2 Total Score '6 4 6 2 1  '$ PHQ-9 Total Score '21 19 24 12 15      '$ [place] Office Visit from 03/25/2022 in [facility] ED from 03/06/2022 in [facility] DEPT Admission (Discharged) from 09/17/2021 in [facility] No Risk No Risk       Assessment and Plan:   Carla Martinez "Carla Martinez" is a 61 year old female with a past psychiatric history significant for anorexia, depression, anxiety, PTSD, and alcohol use disorder who presents to [facility] to establish psychiatric care and for medication management.  Patient presents today dealing with worsening depression, anxiety, and panic attacks.  Patient expresses that her symptoms are attributed to the passing of her eldest daughter.  Patient is currently taking medications and states that they have been helpful in managing her symptoms.  Patient would like to continue taking her medications as prescribed.  Patient's medications to be e-prescribed to pharmacy of choice.  Collaboration of Care: Medication Management AEB provider managing patient's psychiatric medications, Primary Care  Provider AEB patient being seen by primary care provider, Psychiatrist AEB patient being followed by mental health provider, and Referral or follow-up with counselor/therapist AEB patient to be  set up with a licensed clinical social worker at the end of this encounter  Patient/Guardian was advised Release of Information must be obtained prior to any record release in order to collaborate their care with an outside provider. Patient/Guardian was advised if they have not already done so to contact the registration department to sign all necessary forms in order for [place] to release information regarding their care.   Consent: Patient/Guardian gives verbal consent for treatment and assignment of benefits for services provided during this visit. Patient/Guardian expressed understanding and agreed to proceed.   1. Mild recurrent major depression (HCC)  - amitriptyline (ELAVIL) 50 MG tablet; Take 1 tablet (50 mg total) by mouth at bedtime.  Dispense: 30 tablet; Refill: 2 - hydrOXYzine (ATARAX) 50 MG tablet; Take 1 tablet (50 mg total) by mouth 3 (three) times daily as needed.  Dispense: 90 tablet; Refill: 1 - QUEtiapine (SEROQUEL) 300 MG tablet; Take 1 tablet (300 mg total) by mouth at bedtime.  Dispense: 30 tablet; Refill: 2  2. Anorexia nervosa in remission  - hydrOXYzine (ATARAX) 50 MG tablet; Take 1 tablet (50 mg total) by mouth 3 (three) times daily as needed.  Dispense: 90 tablet; Refill: 1  3. Alcohol use disorder, severe, in early remission ([place])   4. Bereavement   5. Anxiety  - gabapentin (NEURONTIN) 100 MG capsule; Take 2 capsules (200 mg total) by mouth 3 (three) times daily. Pt needs appt with PCP for further refills.  Dispense: 180 capsule; Refill: 2 - QUEtiapine (SEROQUEL) 300 MG tablet; Take 1 tablet (300 mg total) by mouth at bedtime.  Dispense: 30 tablet; Refill: 2  6. Insomnia, unspecified type  - gabapentin (NEURONTIN) 100 MG capsule; Take 2 capsules (200 mg total) by mouth 3  (three) times daily. Pt needs appt with PCP for further refills.  Dispense: 180 capsule; Refill: 2  Patient to follow-up in 6 weeks Provider spent a total of 40 minutes with the patient/reviewing patient's chart  Malachy Mood, PA 6/9/20239:25 PM

## 2022-03-28 ENCOUNTER — Encounter (HOSPITAL_COMMUNITY): Payer: Self-pay | Admitting: Physician Assistant

## 2022-03-30 ENCOUNTER — Other Ambulatory Visit: Payer: Self-pay | Admitting: Family Medicine

## 2022-03-30 DIAGNOSIS — R0781 Pleurodynia: Secondary | ICD-10-CM

## 2022-03-31 NOTE — Telephone Encounter (Signed)
Last filled on 03/10/22 #90 with 0 refill

## 2022-04-06 ENCOUNTER — Other Ambulatory Visit: Payer: Self-pay | Admitting: Gastroenterology

## 2022-04-16 ENCOUNTER — Other Ambulatory Visit (HOSPITAL_COMMUNITY): Payer: Self-pay | Admitting: Physician Assistant

## 2022-04-16 DIAGNOSIS — F5000 Anorexia nervosa, unspecified: Secondary | ICD-10-CM

## 2022-04-16 DIAGNOSIS — F330 Major depressive disorder, recurrent, mild: Secondary | ICD-10-CM

## 2022-05-01 ENCOUNTER — Telehealth (HOSPITAL_COMMUNITY): Payer: Self-pay | Admitting: *Deleted

## 2022-05-01 NOTE — Telephone Encounter (Signed)
Pharmacy request for patients hydroxyzine. Reviewed record and she had a rx originated on 03/25/22 for a month and a refill. She should not be out till aug. She has a future appt on 05/10/22. Will not renew this at this time.

## 2022-05-07 ENCOUNTER — Encounter (HOSPITAL_COMMUNITY): Payer: Self-pay

## 2022-05-08 ENCOUNTER — Telehealth (HOSPITAL_COMMUNITY): Payer: 59 | Admitting: Physician Assistant

## 2022-05-09 ENCOUNTER — Other Ambulatory Visit: Payer: Self-pay | Admitting: Gastroenterology

## 2022-05-29 ENCOUNTER — Telehealth (HOSPITAL_COMMUNITY): Payer: Self-pay | Admitting: *Deleted

## 2022-05-29 ENCOUNTER — Other Ambulatory Visit (HOSPITAL_COMMUNITY): Payer: Self-pay | Admitting: Student in an Organized Health Care Education/Training Program

## 2022-05-29 DIAGNOSIS — F5000 Anorexia nervosa, unspecified: Secondary | ICD-10-CM

## 2022-05-29 DIAGNOSIS — F330 Major depressive disorder, recurrent, mild: Secondary | ICD-10-CM

## 2022-05-29 MED ORDER — HYDROXYZINE HCL 50 MG PO TABS: 50.0000 mg | ORAL_TABLET | Freq: Three times a day (TID) | ORAL | 3 refills | Status: DC | PRN

## 2022-05-29 NOTE — Telephone Encounter (Signed)
Rx Refill Request hydrOXYzine (ATARAX) 50 MG tablet  [place]

## 2022-06-08 ENCOUNTER — Other Ambulatory Visit: Payer: Self-pay | Admitting: Family Medicine

## 2022-06-09 ENCOUNTER — Ambulatory Visit (HOSPITAL_COMMUNITY): Payer: 59 | Admitting: Clinical

## 2022-06-12 ENCOUNTER — Ambulatory Visit: Payer: 59 | Admitting: Family Medicine

## 2022-06-13 ENCOUNTER — Ambulatory Visit: Payer: 59 | Admitting: Family Medicine

## 2022-06-19 ENCOUNTER — Other Ambulatory Visit (HOSPITAL_COMMUNITY): Payer: Self-pay | Admitting: Physician Assistant

## 2022-06-19 DIAGNOSIS — G4700 Insomnia, unspecified: Secondary | ICD-10-CM

## 2022-06-19 DIAGNOSIS — F330 Major depressive disorder, recurrent, mild: Secondary | ICD-10-CM

## 2022-06-19 DIAGNOSIS — F419 Anxiety disorder, unspecified: Secondary | ICD-10-CM

## 2022-06-26 ENCOUNTER — Telehealth (HOSPITAL_COMMUNITY): Payer: Self-pay | Admitting: *Deleted

## 2022-06-26 NOTE — Telephone Encounter (Signed)
Provider was contacted by Eliezer Lofts, RMA guarding medication refill.  Patient's medication was e-prescribed to pharmacy of choice.

## 2022-06-26 NOTE — Telephone Encounter (Signed)
Rx Refill Request -- [place], [place] - [address]  gabapentin (NEURONTIN) 100 MG capsule                           && QUEtiapine (SEROQUEL) 300 MG tablet

## 2022-06-28 ENCOUNTER — Other Ambulatory Visit: Payer: Self-pay

## 2022-06-28 ENCOUNTER — Emergency Department (HOSPITAL_COMMUNITY)
Admission: EM | Admit: 2022-06-28 | Discharge: 2022-06-28 | Disposition: A | Discharge status: Home / Self Care | Payer: 59 | Attending: Emergency Medicine | Admitting: Emergency Medicine

## 2022-06-28 ENCOUNTER — Emergency Department (HOSPITAL_COMMUNITY): Payer: 59

## 2022-06-28 ENCOUNTER — Encounter (HOSPITAL_COMMUNITY): Payer: Self-pay | Admitting: Emergency Medicine

## 2022-06-28 DIAGNOSIS — R072 Precordial pain: Secondary | ICD-10-CM | POA: Insufficient documentation

## 2022-06-28 DIAGNOSIS — E876 Hypokalemia: Secondary | ICD-10-CM | POA: Insufficient documentation

## 2022-06-28 DIAGNOSIS — Z20822 Contact with and (suspected) exposure to covid-19: Secondary | ICD-10-CM | POA: Insufficient documentation

## 2022-06-28 DIAGNOSIS — F1090 Alcohol use, unspecified, uncomplicated: Secondary | ICD-10-CM | POA: Insufficient documentation

## 2022-06-28 DIAGNOSIS — I480 Paroxysmal atrial fibrillation: Secondary | ICD-10-CM | POA: Insufficient documentation

## 2022-06-28 DIAGNOSIS — R112 Nausea with vomiting, unspecified: Secondary | ICD-10-CM | POA: Insufficient documentation

## 2022-06-28 LAB — CBC
HCT: 35.5 % — ABNORMAL LOW (ref 36.0–46.0)
Hemoglobin: 12.2 g/dL (ref 12.0–15.0)
MCH: 30.3 pg (ref 26.0–34.0)
MCHC: 34.4 g/dL (ref 30.0–36.0)
MCV: 88.3 fL (ref 80.0–100.0)
Platelets: 301 10*3/uL (ref 150–400)
RBC: 4.02 MIL/uL (ref 3.87–5.11)
RDW: 14 % (ref 11.5–15.5)
WBC: 13 10*3/uL — ABNORMAL HIGH (ref 4.0–10.5)
nRBC: 0 % (ref 0.0–0.2)

## 2022-06-28 LAB — COMPREHENSIVE METABOLIC PANEL
ALT: 19 U/L (ref 0–44)
AST: 24 U/L (ref 15–41)
Albumin: 3.5 g/dL (ref 3.5–5.0)
Alkaline Phosphatase: 106 U/L (ref 38–126)
Anion gap: 18 — ABNORMAL HIGH (ref 5–15)
BUN: 18 mg/dL (ref 6–20)
CO2: 19 mmol/L — ABNORMAL LOW (ref 22–32)
Calcium: 8.4 mg/dL — ABNORMAL LOW (ref 8.9–10.3)
Chloride: 103 mmol/L (ref 98–111)
Creatinine, Ser: 1.08 mg/dL — ABNORMAL HIGH (ref 0.44–1.00)
GFR, Estimated: 60 mL/min — ABNORMAL LOW (ref >60)
Glucose, Bld: 105 mg/dL — ABNORMAL HIGH (ref 70–99)
Potassium: 3.3 mmol/L — ABNORMAL LOW (ref 3.5–5.1)
Sodium: 140 mmol/L (ref 135–145)
Total Bilirubin: 0.7 mg/dL (ref 0.3–1.2)
Total Protein: 6.2 g/dL — ABNORMAL LOW (ref 6.5–8.1)

## 2022-06-28 LAB — RAPID URINE DRUG SCREEN, HOSP PERFORMED
Amphetamines: NOT DETECTED
Barbiturates: NOT DETECTED
Benzodiazepines: NOT DETECTED
Cocaine: NOT DETECTED
Opiates: NOT DETECTED
Tetrahydrocannabinol: POSITIVE — AB

## 2022-06-28 LAB — ETHANOL: Alcohol, Ethyl (B): <10 mg/dL (ref <10)

## 2022-06-28 LAB — I-STAT BETA HCG BLOOD, ED (MC, WL, AP ONLY): I-stat hCG, quantitative: 7.8 m[IU]/mL — ABNORMAL HIGH (ref <5)

## 2022-06-28 LAB — CBG MONITORING, ED: Glucose-Capillary: 108 mg/dL — ABNORMAL HIGH (ref 70–99)

## 2022-06-28 LAB — ACETAMINOPHEN LEVEL: Acetaminophen (Tylenol), Serum: <10 ug/mL — ABNORMAL LOW (ref 10–30)

## 2022-06-28 LAB — SARS CORONAVIRUS 2 BY RT PCR: SARS Coronavirus 2 by RT PCR: NEGATIVE

## 2022-06-28 LAB — SALICYLATE LEVEL: Salicylate Lvl: <7 mg/dL — ABNORMAL LOW (ref 7.0–30.0)

## 2022-06-28 LAB — TROPONIN I (HIGH SENSITIVITY)
Troponin I (High Sensitivity): 2 ng/L (ref <18)
Troponin I (High Sensitivity): 3 ng/L (ref <18)

## 2022-06-28 LAB — HCG, QUANTITATIVE, PREGNANCY: hCG, Beta Chain, Quant, S: 2 m[IU]/mL (ref <5)

## 2022-06-28 MED ORDER — ALUM & MAG HYDROXIDE-SIMETH 200-200-20 MG/5ML PO SUSP
30.0000 mL | Freq: Once | ORAL | Status: AC
  Administered 2022-06-28: 30 mL via ORAL
  Filled 2022-06-28: qty 30

## 2022-06-28 MED ORDER — SUCRALFATE 1 GM/10ML PO SUSP: 1.0000 g | Freq: Three times a day (TID) | ORAL | 0 refills | Status: DC

## 2022-06-28 MED ORDER — ONDANSETRON HCL 4 MG/2ML IJ SOLN
4.0000 mg | Freq: Once | INTRAMUSCULAR | Status: AC
Start: 2022-06-28 — End: 2022-06-28
  Administered 2022-06-28: 4 mg via INTRAVENOUS
  Filled 2022-06-28: qty 2

## 2022-06-28 MED ORDER — FAMOTIDINE IN NACL 20-0.9 MG/50ML-% IV SOLN
20.0000 mg | Freq: Once | INTRAVENOUS | Status: AC
  Administered 2022-06-28: 20 mg via INTRAVENOUS
  Filled 2022-06-28: qty 50

## 2022-06-28 MED ORDER — SODIUM CHLORIDE 0.9 % IV SOLN: INTRAVENOUS | Status: DC

## 2022-06-28 MED ORDER — CARBAMAZEPINE 200 MG PO TABS
ORAL_TABLET | ORAL | 0 refills | Status: DC
Start: 2022-06-28 — End: 2022-08-05

## 2022-06-28 NOTE — ED Triage Notes (Signed)
Patient here with chest pain, shortness of breath, nausea and vomiting after being on a multiday indulgence of wine.  Patient does have a history of alcoholism, patient has been asleep in her bed for the last two days per mother.  Patient with sinus on monitor, PACs and PVCs.  Patient received one nitro and BP dropped systolic to 174.  Patient was given 569m of fluid en route to ED.

## 2022-06-28 NOTE — ED Provider Notes (Signed)
[facility] EMERGENCY DEPARTMENT Provider Note   CSN: [id number] Arrival date & time: 06/28/22  0150     History  Chief Complaint  Patient presents with   Chest Pain   Shortness of Breath    Carla Martinez is a 61 y.o. female.  The history is provided by the patient.  Chest Pain Pain location:  Substernal area Pain quality: dull   Pain radiates to:  Does not radiate Pain severity:  Moderate Onset quality:  Sudden Duration: hours after vomiting for hours following several days of drinking large amounts of wine. Timing:  Constant Progression:  Unchanged Chronicity:  New Context: at rest   Context comment:  Post emesis Relieved by:  Nothing Worsened by:  Nothing Ineffective treatments:  None tried Associated symptoms: nausea and vomiting   Associated symptoms: no diaphoresis, no fever, no palpitations and no weakness   Risk factors: not obese   Patient with a history of polysubstance abuse who presents with chest pain following 2 days of drinking wine heavily and then vomiting.  States she drank and unknown quantity of wine then began vomiting and then had chest pain and felt SOB.  She also thinks she has covid.      Past Medical History:  Diagnosis Date   Anxiety    Depression    PAF (paroxysmal atrial fibrillation) (HCC)    Polysubstance abuse ([place])    Takotsubo cardiomyopathy     Home Medications Prior to Admission medications   Medication Sig Start Date End Date Taking? Authorizing Provider  amitriptyline (ELAVIL) 25 MG tablet TAKE 1 TABLET BY MOUTH AT BEDTIME 05/12/22   Vanga, [name], MD  carbamazepine (TEGRETOL) 200 MG tablet '800mg'$  PO QD X 1D, then '600mg'$  PO QD X 1D, then '400mg'$  QD X 1D, then '200mg'$  PO QD X 2D 06/28/22  Yes Tarence Searcy, MD  sucralfate (CARAFATE) 1 GM/10ML suspension Take 10 mLs (1 g total) by mouth 4 (four) times daily -  with meals and at bedtime. 06/28/22  Yes Wesson Stith, MD  amitriptyline (ELAVIL) 50 MG tablet  Take 1 tablet (50 mg total) by mouth at bedtime. 03/25/22   Nwoko, Terese Door, PA  amoxicillin (AMOXIL) 500 MG tablet Take 500 mg by mouth 3 (three) times daily.    [provider]  B Complex-C (SUPER B COMPLEX PO) Take 1 tablet by mouth daily.    [provider]  calcium carbonate (OS-CAL - DOSED IN MG OF ELEMENTAL CALCIUM) 1250 (500 Ca) MG tablet Take 1 tablet by mouth 2 (two) times daily with a meal.    [provider]  gabapentin (NEURONTIN) 100 MG capsule TAKE 2 CAPSULES BY MOUTH THREE TIMES DAILY. PT NEEDS APPT WITH PCP FOR FURTHER REFILLS. 06/26/22   Malachy Mood, PA  hydrOXYzine (ATARAX) 50 MG tablet Take 1 tablet (50 mg total) by mouth 3 (three) times daily as needed. 05/29/22   Freida Busman, MD  Magnesium 250 MG TABS Take 250 mg by mouth daily.    [provider]  meloxicam (MOBIC) 7.5 MG tablet Take 1 tablet (7.5 mg total) by mouth daily. 03/10/22   [name], MD  metoprolol succinate (TOPROL-XL) 25 MG 24 hr tablet Take 0.5 tablets (12.5 mg total) by mouth daily. 12/11/21   [name], MD  pantoprazole (PROTONIX) 40 MG tablet Take 1 tablet by mouth once daily 06/09/22   [name], MD  QUEtiapine (SEROQUEL) 300 MG tablet TAKE 1 TABLET BY  MOUTH AT BEDTIME 06/26/22   Nwoko, Isidoro Donning E, PA  TURMERIC PO Take 500 mg by mouth daily. w [name]    [provider]      Allergies    Patient has no known allergies.    Review of Systems   Review of Systems  Constitutional:  Negative for diaphoresis and fever.  Respiratory:  Negative for wheezing and stridor.   Cardiovascular:  Positive for chest pain. Negative for palpitations.  Gastrointestinal:  Positive for nausea and vomiting.  Neurological:  Negative for weakness.  All other systems reviewed and are negative.   Physical Exam Updated Vital Signs BP (!) 111/52 (BP Location: Right Arm)   Pulse (!) 58   Temp 97.9 F (36.6 C) (Oral)   Resp 18   SpO2 99%  Physical Exam Vitals  and nursing note reviewed.  Constitutional:      General: She is not in acute distress.    Appearance: Normal appearance. She is well-developed.  HENT:     Head: Normocephalic and atraumatic.     Nose: Nose normal.     Mouth/Throat:     Mouth: Mucous membranes are moist.  Eyes:     Pupils: Pupils are equal, round, and reactive to light.  Cardiovascular:     Rate and Rhythm: Normal rate and regular rhythm.     Pulses: Normal pulses.     Heart sounds: Normal heart sounds.  Pulmonary:     Effort: Pulmonary effort is normal. No respiratory distress.     Breath sounds: Normal breath sounds.  Abdominal:     General: Bowel sounds are normal. There is no distension.     Palpations: Abdomen is soft.     Tenderness: There is no abdominal tenderness. There is no guarding or rebound.  Genitourinary:    Vagina: No vaginal discharge.  Musculoskeletal:        General: Normal range of motion.     Cervical back: Normal range of motion and neck supple.  Skin:    General: Skin is dry.     Capillary Refill: Capillary refill takes less than 2 seconds.     Findings: No erythema or rash.  Neurological:     General: No focal deficit present.     Mental Status: She is alert.     Deep Tendon Reflexes: Reflexes normal.  Psychiatric:        Mood and Affect: Mood normal.     ED Results / Procedures / Treatments   Labs (all labs ordered are listed, but only abnormal results are displayed) Results for orders placed or performed during the hospital encounter of 06/28/22  SARS Coronavirus 2 by RT PCR (hospital order, performed in [place] hospital lab) *cepheid single result test* Anterior Nasal Swab   Specimen: Anterior Nasal Swab  Result Value Ref Range   SARS Coronavirus 2 by RT PCR NEGATIVE NEGATIVE  CBC  Result Value Ref Range   WBC 13.0 (H) 4.0 - 10.5 K/uL   RBC 4.02 3.87 - 5.11 MIL/uL   Hemoglobin 12.2 12.0 - 15.0 g/dL   HCT 35.5 (L) 36.0 - 46.0 %   MCV 88.3 80.0 - 100.0 fL   MCH 30.3  26.0 - 34.0 pg   MCHC 34.4 30.0 - 36.0 g/dL   RDW 14.0 11.5 - 15.5 %   Platelets 301 150 - 400 K/uL   nRBC 0.0 0.0 - 0.2 %  Salicylate level  Result Value Ref Range   Salicylate Lvl <4.2 (L) 7.0 -  30.0 mg/dL  Acetaminophen level  Result Value Ref Range   Acetaminophen (Tylenol), Serum <10 (L) 10 - 30 ug/mL  Ethanol  Result Value Ref Range   Alcohol, Ethyl (B) <10 <10 mg/dL  Urine rapid drug screen (hosp performed)  Result Value Ref Range   Opiates NONE DETECTED NONE DETECTED   Cocaine NONE DETECTED NONE DETECTED   Benzodiazepines NONE DETECTED NONE DETECTED   Amphetamines NONE DETECTED NONE DETECTED   Tetrahydrocannabinol POSITIVE (A) NONE DETECTED   Barbiturates NONE DETECTED NONE DETECTED  hCG, quantitative, pregnancy  Result Value Ref Range   hCG, Beta Chain, Quant, S 2 <5 mIU/mL  Comprehensive metabolic panel  Result Value Ref Range   Sodium 140 135 - 145 mmol/L   Potassium 3.3 (L) 3.5 - 5.1 mmol/L   Chloride 103 98 - 111 mmol/L   CO2 19 (L) 22 - 32 mmol/L   Glucose, Bld 105 (H) 70 - 99 mg/dL   BUN 18 6 - 20 mg/dL   Creatinine, Ser 1.08 (H) 0.44 - 1.00 mg/dL   Calcium 8.4 (L) 8.9 - 10.3 mg/dL   Total Protein 6.2 (L) 6.5 - 8.1 g/dL   Albumin 3.5 3.5 - 5.0 g/dL   AST 24 15 - 41 U/L   ALT 19 0 - 44 U/L   Alkaline Phosphatase 106 38 - 126 U/L   Total Bilirubin 0.7 0.3 - 1.2 mg/dL   GFR, Estimated 60 (L) >60 mL/min   Anion gap 18 (H) 5 - 15  I-Stat beta hCG blood, ED  Result Value Ref Range   I-stat hCG, quantitative 7.8 (H) <5 mIU/mL   Comment 3          CBG monitoring, ED  Result Value Ref Range   Glucose-Capillary 108 (H) 70 - 99 mg/dL  Troponin I (High Sensitivity)  Result Value Ref Range   Troponin I (High Sensitivity) 2 <18 ng/L  Troponin I (High Sensitivity)  Result Value Ref Range   Troponin I (High Sensitivity) 3 <18 ng/L   DG Chest 2 View  Result Date: 06/28/2022 CLINICAL DATA:  Chest pain and dyspnea EXAM: CHEST - 2 VIEW COMPARISON:  Radiographs  03/10/2022 FINDINGS: No significant change from prior. Hyperinflation. Blunting of the costophrenic angles. No focal consolidation or pneumothorax. No acute osseous abnormality. IMPRESSION: No acute abnormality.  COPD. Electronically Signed   By: Placido Sou M.D.   On: 06/28/2022 03:18     EKG EKG Interpretation  Date/Time:  Saturday June 28 2022 02:00:24 EDT Ventricular Rate:  56 PR Interval:  150 QRS Duration: 99 QT Interval:  490 QTC Calculation: 473 R Axis:   78 Text Interpretation: Sinus rhythm Confirmed by Randal Buba, Venessa Wickham ([id number]) on 06/28/2022 2:43:28 AM  Radiology DG Chest 2 View  Result Date: 06/28/2022 CLINICAL DATA:  Chest pain and dyspnea EXAM: CHEST - 2 VIEW COMPARISON:  Radiographs 03/10/2022 FINDINGS: No significant change from prior. Hyperinflation. Blunting of the costophrenic angles. No focal consolidation or pneumothorax. No acute osseous abnormality. IMPRESSION: No acute abnormality.  COPD. Electronically Signed   By: Placido Sou M.D.   On: 06/28/2022 03:18    Procedures Procedures    Medications Ordered in ED Medications  0.9 %  sodium chloride infusion ( Intravenous New Bag/Given 06/28/22 0331)  famotidine (PEPCID) IVPB 20 mg premix (20 mg Intravenous New Bag/Given 06/28/22 0332)  alum & mag hydroxide-simeth (MAALOX/MYLANTA) 200-200-20 MG/5ML suspension 30 mL (30 mLs Oral Given 06/28/22 0331)  ondansetron (ZOFRAN) injection 4 mg (4 mg Intravenous Given  06/28/22 0330)    ED Course/ Medical Decision Making/ A&P                           Medical Decision Making Drinking heavily and then started vomiting and then had chest pain.    Problems Addressed: Nausea and vomiting, unspecified vomiting type:    Details: Like secondary to alcohol use  Precordial pain:    Details: Post emesis  Amount and/or Complexity of Data Reviewed Independent Historian: EMS    Details: See above  External Data Reviewed: notes.    Details: Previous notes reviewed  Labs:  ordered.    Details: All labs reviewed:  negative pregnancy test.  negative tylenol and salicylate levels.  2 negative troponins 2 and 3.  UDS is positive for THC.  Normal sodium potassium slightly low 3.3 creatinine slightly elevated 1.08.  White count slight elevation 13, normal hemoglobin and platelet count. Negative covid  Radiology: ordered and independent interpretation performed.    Details: No PNA on CXR by me  ECG/medicine tests: ordered and independent interpretation performed. Decision-making details documented in ED Course.  Risk OTC drugs. Prescription drug management. Risk Details: Vomiting secondary to alcohol and marijuana use.  This has likely caused the chest pain the patient was experience from gastritis.  I suspect she then became quite anxious and felt SOB.  Ruled out for MI in the ED.  Highly doubt PE.  Stable for discharge.  Tegretol taper for alcohol.  Strict return.     ing why or why not admission, treatments were needed:1} Final Clinical Impression(s) / ED Diagnoses Final diagnoses:  Nausea and vomiting, unspecified vomiting type  Alcohol use disorder  Precordial pain   Return for intractable cough, coughing up blood, fevers > 100.4 unrelieved by medication, shortness of breath, intractable vomiting, chest pain, shortness of breath, weakness, numbness, changes in speech, facial asymmetry, abdominal pain, passing out, Inability to tolerate liquids or food, cough, altered mental status or any concerns. No signs of systemic illness or infection. The patient is nontoxic-appearing on exam and vital signs are within normal limits.  I have reviewed the triage vital signs and the nursing notes. Pertinent labs & imaging results that were available during my care of the patient were reviewed by me and considered in my medical decision making (see chart for details). After history, exam, and medical workup I feel the patient has been appropriately medically screened and is safe  for discharge home. Pertinent diagnoses were discussed with the patient. Patient was given return precautions.  Rx / DC Orders ED Discharge Orders          Ordered    carbamazepine (TEGRETOL) 200 MG tablet        06/28/22 0340    sucralfate (CARAFATE) 1 GM/10ML suspension  3 times daily with meals & bedtime        06/28/22 [place], Relda Agosto, MD 06/28/22 0430

## 2022-06-28 NOTE — ED Notes (Addendum)
Tolerating PO fluids, previously ambulated to restroom

## 2022-06-29 ENCOUNTER — Telehealth (HOSPITAL_COMMUNITY): Payer: Self-pay | Admitting: Clinical

## 2022-06-29 NOTE — Telephone Encounter (Signed)
Therapist was able to make contact with the client via [facility] video for the scheduled appointment for clinical assessment.  Client reported she did not plan on keeping the appointment due to a change in her insurance and was not sure she will be able to continue services.  Therapist will have abdomen staff to call the client to reschedule her appointment.

## 2022-07-04 DIAGNOSIS — M25551 Pain in right hip: Secondary | ICD-10-CM

## 2022-07-04 HISTORY — DX: Pain in right hip: M25.551

## 2022-07-07 ENCOUNTER — Telehealth: Payer: Self-pay | Admitting: Family Medicine

## 2022-07-07 NOTE — Telephone Encounter (Signed)
Please advised patient I would recommend she set up an appointment with [facility].   They have a walk-in clinic. I can place a referral if needed
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
## 2022-07-07 NOTE — Telephone Encounter (Signed)
Spoke to pt and relayed info from Dr. Einar Pheasant. Pt will call [facility]

## 2022-07-08 ENCOUNTER — Ambulatory Visit (INDEPENDENT_AMBULATORY_CARE_PROVIDER_SITE_OTHER): Payer: No Payment, Other | Admitting: Physician Assistant

## 2022-07-08 ENCOUNTER — Encounter (HOSPITAL_COMMUNITY): Payer: Self-pay | Admitting: Physician Assistant

## 2022-07-08 DIAGNOSIS — F1021 Alcohol dependence, in remission: Secondary | ICD-10-CM

## 2022-07-08 DIAGNOSIS — F330 Major depressive disorder, recurrent, mild: Secondary | ICD-10-CM | POA: Diagnosis not present

## 2022-07-08 DIAGNOSIS — F419 Anxiety disorder, unspecified: Secondary | ICD-10-CM

## 2022-07-08 DIAGNOSIS — G4700 Insomnia, unspecified: Secondary | ICD-10-CM

## 2022-07-08 DIAGNOSIS — F329 Major depressive disorder, single episode, unspecified: Secondary | ICD-10-CM

## 2022-07-08 DIAGNOSIS — F5000 Anorexia nervosa, unspecified: Secondary | ICD-10-CM

## 2022-07-08 DIAGNOSIS — Z634 Disappearance and death of family member: Secondary | ICD-10-CM | POA: Diagnosis not present

## 2022-07-08 DIAGNOSIS — F1090 Alcohol use, unspecified, uncomplicated: Secondary | ICD-10-CM

## 2022-07-08 HISTORY — DX: Major depressive disorder, single episode, unspecified: F32.9

## 2022-07-08 HISTORY — DX: Insomnia, unspecified: G47.00

## 2022-07-08 HISTORY — DX: Anorexia nervosa, unspecified: F50.00

## 2022-07-08 HISTORY — DX: Alcohol use, unspecified, uncomplicated: F10.90

## 2022-07-08 MED ORDER — HYDROXYZINE HCL 50 MG PO TABS: 50.0000 mg | ORAL_TABLET | Freq: Three times a day (TID) | ORAL | 3 refills | Status: DC | PRN

## 2022-07-08 MED ORDER — GABAPENTIN 300 MG PO CAPS: 300.0000 mg | ORAL_CAPSULE | Freq: Three times a day (TID) | ORAL | 2 refills | Status: DC

## 2022-07-08 MED ORDER — AMITRIPTYLINE HCL 100 MG PO TABS: 100.0000 mg | ORAL_TABLET | Freq: Every day | ORAL | 2 refills | Status: DC

## 2022-07-08 MED ORDER — QUETIAPINE FUMARATE 300 MG PO TABS: 300.0000 mg | ORAL_TABLET | Freq: Every day | ORAL | 2 refills | Status: DC
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
## 2022-07-25 ENCOUNTER — Telehealth: Payer: Self-pay | Admitting: Family Medicine

## 2022-07-25 NOTE — Telephone Encounter (Signed)
Please call patient to set up [facility] appointment to discuss getting her repeat MRI as well as general care.   Last year she required sedation, if this is needed again she will need an office visit for a pre-sedation clearance  Follow up pancreatic protocol MRI is recommended in 1 years time. This recommendation follows ACR consensus guidelines: Management of Incidental Pancreatic Cysts: A White Paper of the ACR Incidental Findings Committee. [place] 8416;[id number].

## 2022-07-28 ENCOUNTER — Ambulatory Visit (INDEPENDENT_AMBULATORY_CARE_PROVIDER_SITE_OTHER)
Admission: RE | Admit: 2022-07-28 | Discharge: 2022-07-28 | Disposition: A | Discharge status: Home / Self Care | Payer: Self-pay | Source: Ambulatory Visit | Attending: Nurse Practitioner | Admitting: Nurse Practitioner

## 2022-07-28 ENCOUNTER — Encounter: Payer: Self-pay | Admitting: Nurse Practitioner

## 2022-07-28 ENCOUNTER — Ambulatory Visit (INDEPENDENT_AMBULATORY_CARE_PROVIDER_SITE_OTHER): Payer: Self-pay | Admitting: Nurse Practitioner

## 2022-07-28 DIAGNOSIS — M25551 Pain in right hip: Secondary | ICD-10-CM

## 2022-07-28 DIAGNOSIS — I509 Heart failure, unspecified: Secondary | ICD-10-CM

## 2022-07-28 DIAGNOSIS — R9431 Abnormal electrocardiogram [ECG] [EKG]: Secondary | ICD-10-CM

## 2022-07-28 DIAGNOSIS — F32A Depression, unspecified: Secondary | ICD-10-CM

## 2022-07-28 DIAGNOSIS — F411 Generalized anxiety disorder: Secondary | ICD-10-CM

## 2022-07-28 NOTE — Patient Instructions (Signed)
Nice to see you today I will be in touch with the xray once I have them Call the orthopedist about your foot Follow up with me in 3ish months to recheck to see how you are doing, sooner if you need me
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
## 2022-07-28 NOTE — Assessment & Plan Note (Signed)
On several QT prolonging medications.
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
## 2022-07-28 NOTE — Assessment & Plan Note (Signed)
Followed by behavioral health currently on amitriptyline, Seroquel, hydroxyzine.

## 2022-07-28 NOTE — Assessment & Plan Note (Signed)
Patient currently maintained by behavioral health provider.  He manages patient's Seroquel, hydroxyzine, amitriptyline.

## 2022-07-28 NOTE — Assessment & Plan Note (Signed)
Patient was maintained on lopressor. States she did not see a benefit and has stopped taking the medication. Not currently followed by cardiology

## 2022-07-30 ENCOUNTER — Telehealth: Payer: Self-pay | Admitting: Nurse Practitioner

## 2022-07-30 DIAGNOSIS — M25551 Pain in right hip: Secondary | ICD-10-CM

## 2022-07-30 NOTE — Telephone Encounter (Signed)
Patient states she does not feel like she is going to be able to try the open MRI, explained what it is, this MRI still does enclose patient in some, she states she is extremely claustrophobic.  Patient states if Catalina [place] thinks MRI is a necessity to rule out what is going on then she will proceed and will need the sedation. Understands it is not the safest option out of the 2 choices.

## 2022-07-30 NOTE — Telephone Encounter (Signed)
Spoke with our sport medicine physician and he recommends an open MRI. Has she had one of these before. This is a cheaper and safer option vs sedation

## 2022-07-30 NOTE — Telephone Encounter (Signed)
IV sedation- for more details in regards to previous MRI see phone notes and OV with Dr Einar Pheasant in 07/2021

## 2022-07-30 NOTE — Telephone Encounter (Signed)
To verify was see give medication to take by mouth or was she given IV medication
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
## 2022-07-30 NOTE — Telephone Encounter (Signed)
State order placed for [facility] at [facility] with sedation. We may need clearance from cardiology. Will need to await and see what is said

## 2022-07-30 NOTE — Telephone Encounter (Signed)
Left message to call back  

## 2022-07-30 NOTE — Telephone Encounter (Signed)
Pt called wanting to speak to Anastasiya about what all she needs to do for the MRI. Call back #  [phone]

## 2022-07-30 NOTE — Telephone Encounter (Signed)
Provider is working on the order. Do not have any information about this right now. Advised patient on 07/29/22 she would be contacted about this

## 2022-07-31 NOTE — Telephone Encounter (Signed)
Carla Martinez [phone] with [facility] called stating that they are not able to do MRI with sedation STAT, they only do these 2 days a week and booked out until November. No way for them to work patient in for this.  She would have to go to ER and maybe get it that way but I know patient was already reluctant with this MRI due to not having insurance. It may be best for provider to speak with patient directly on any recommendations.

## 2022-08-01 ENCOUNTER — Ambulatory Visit: Admit: 2022-08-01 | Payer: Self-pay

## 2022-08-01 SURGERY — MRI WITH ANESTHESIA
Anesthesia: General | Laterality: Left

## 2022-08-01 NOTE — Telephone Encounter (Signed)
Call ortho and let a message for Carla Martinez on next steps in regard to r/o AVN. Let me cell number for him to call me back

## 2022-08-05 ENCOUNTER — Telehealth: Payer: Self-pay | Admitting: Nurse Practitioner

## 2022-08-05 ENCOUNTER — Other Ambulatory Visit: Payer: Self-pay

## 2022-08-05 ENCOUNTER — Encounter (HOSPITAL_COMMUNITY): Payer: Self-pay | Admitting: *Deleted

## 2022-08-05 NOTE — Telephone Encounter (Signed)
Dr. Corky Sox,  I wanted to touch base. I recently took over care of [name] as Dr. Einar Pheasant left the practice. The patient needs an MRI and I have offered oral medications and open MRI but patient states she cannot tolerate it. I see that her EF is 25-30%. She has underwent a MRI in the past with sedation. We have that planned for this Thursday. Wanted to run that by you. I understand she not the best candidate for general anesthesia but has had it in the past  Thanks,  [facility]

## 2022-08-05 NOTE — Progress Notes (Signed)
PCP - Karl Ito, NP Cardiologist - n/a Gertie Fey - Dr Sherri Sear  Chest x-ray - 06/28/22 EKG - 06/28/22 Stress Test - n/a ECHO - 06/28/22 Cardiac Cath - 07/05/21  ICD Pacemaker/Loop - n/a  Sleep Study -  n/a CPAP - none  Anesthesia review: Yes Carla Hail, PA  Coronavirus Screening Do you have any of the following symptoms:  Cough yes/no: No Fever (>100.79F)  yes/no: No Runny nose yes/no: No Sore throat yes/no: No Difficulty breathing/shortness of breath No  Have you traveled in the last 14 days and where? yes/no: No  Patient verbalized understanding of instructions that were given via phone.

## 2022-08-06 ENCOUNTER — Other Ambulatory Visit: Payer: Self-pay | Admitting: Nurse Practitioner

## 2022-08-06 DIAGNOSIS — M25551 Pain in right hip: Secondary | ICD-10-CM
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
## 2022-08-06 NOTE — Anesthesia Preprocedure Evaluation (Addendum)
Anesthesia Evaluation  Patient identified by MRN, date of birth, ID band Patient awake    Reviewed: Allergy & Precautions, NPO status , Patient's Chart, lab work & pertinent test results  Airway Mallampati: II  TM Distance: >3 FB Neck ROM: Full    Dental  (+) Edentulous Upper, Edentulous Lower   Pulmonary neg pulmonary ROS, Current Smoker and Patient abstained from smoking.,    Pulmonary exam normal        Cardiovascular + CAD and +CHF  + dysrhythmias Atrial Fibrillation  Rhythm:Regular Rate:Normal  Echo 09/18/21 ([facility]): INTERPRETATION  NORMAL LEFT VENTRICULAR SYSTOLIC FUNCTION. Estimated LVEF 50%. No regional wall motion abnormalities.   NORMAL RIGHT VENTRICULAR SYSTOLIC FUNCTION  MILD VALVULAR REGURGITATION (Mild MR, Mild TR)  NO VALVULAR STENOSIS  MILD MITRAL VALVE PROLAPSE  SIGNIFICANT IMPROVEMENT IN LV SYSTOLIC FUNCTION WHEN COMPARED TO ECHO REPORT FROM  07/03/2021   Cardiac cath 07/05/21: . Prox RCA to Mid RCA lesion is 25% stenosed. Colon Flattery Cx to Prox Cx lesion is 25% stenosed. . LV end diastolic pressure is moderately elevated. . There is no aortic valve stenosis.  Conclusion 1. Mild non-obstructive coronary artery disease.  2. Moderately elevated LVEDP of 22 mmHg, with systemic hypotension.     Neuro/Psych Anxiety Depression negative neurological ROS     GI/Hepatic GERD  ,(+)     substance abuse  alcohol use and marijuana use,   Endo/Other  negative endocrine ROS  Renal/GU negative Renal ROS  negative genitourinary   Musculoskeletal negative musculoskeletal ROS (+)   Abdominal Normal abdominal exam  (+)   Peds  Hematology negative hematology ROS (+)   Anesthesia Other Findings   Reproductive/Obstetrics                         Anesthesia Physical Anesthesia Plan  ASA: 3  Anesthesia Plan: General   Post-op Pain Management:    Induction: Intravenous  PONV Risk  Score and Plan: 2 and Ondansetron, Dexamethasone, Treatment may vary due to age or medical condition and Midazolam  Airway Management Planned: Oral ETT  Additional Equipment: None  Intra-op Plan:   Post-operative Plan: Extubation in OR  Informed Consent: I have reviewed the patients History and Physical, chart, labs and discussed the procedure including the risks, benefits and alternatives for the proposed anesthesia with the patient or authorized representative who has indicated his/her understanding and acceptance.     Dental advisory given  Plan Discussed with: CRNA and Surgeon  Anesthesia Plan Comments: (- Patient reports drinking coffee with cream currently at the bedside. Discussed NPO guidelines for this procedure and patient adamant that she wasn't given these instructions. Procedure to be pushed 6 hours from scheduled to follow guidelines and MRI in agreement.   PAT note written 08/06/2022 by Myra Gianotti, PA-C. )   Anesthesia Quick Evaluation

## 2022-08-07 ENCOUNTER — Encounter (HOSPITAL_COMMUNITY): Admission: RE | Disposition: A | Discharge status: Home / Self Care | Payer: Self-pay | Source: Ambulatory Visit

## 2022-08-07 ENCOUNTER — Ambulatory Visit (HOSPITAL_COMMUNITY): Payer: No Typology Code available for payment source | Admitting: Vascular Surgery

## 2022-08-07 ENCOUNTER — Ambulatory Visit (HOSPITAL_COMMUNITY)
Admission: RE | Admit: 2022-08-07 | Discharge: 2022-08-07 | Disposition: A | Discharge status: Home / Self Care | Payer: No Typology Code available for payment source | Source: Ambulatory Visit | Attending: Nurse Practitioner | Admitting: Nurse Practitioner

## 2022-08-07 ENCOUNTER — Ambulatory Visit (HOSPITAL_BASED_OUTPATIENT_CLINIC_OR_DEPARTMENT_OTHER): Payer: No Typology Code available for payment source | Admitting: Vascular Surgery

## 2022-08-07 ENCOUNTER — Encounter (HOSPITAL_COMMUNITY): Payer: Self-pay

## 2022-08-07 DIAGNOSIS — F17210 Nicotine dependence, cigarettes, uncomplicated: Secondary | ICD-10-CM

## 2022-08-07 DIAGNOSIS — F32A Depression, unspecified: Secondary | ICD-10-CM | POA: Insufficient documentation

## 2022-08-07 DIAGNOSIS — K219 Gastro-esophageal reflux disease without esophagitis: Secondary | ICD-10-CM | POA: Insufficient documentation

## 2022-08-07 DIAGNOSIS — I509 Heart failure, unspecified: Secondary | ICD-10-CM | POA: Insufficient documentation

## 2022-08-07 DIAGNOSIS — Z79899 Other long term (current) drug therapy: Secondary | ICD-10-CM | POA: Diagnosis not present

## 2022-08-07 DIAGNOSIS — I2510 Atherosclerotic heart disease of native coronary artery without angina pectoris: Secondary | ICD-10-CM

## 2022-08-07 DIAGNOSIS — Y92415 Exit ramp or entrance ramp of street or highway as the place of occurrence of the external cause: Secondary | ICD-10-CM | POA: Insufficient documentation

## 2022-08-07 DIAGNOSIS — R9431 Abnormal electrocardiogram [ECG] [EKG]: Secondary | ICD-10-CM | POA: Diagnosis not present

## 2022-08-07 DIAGNOSIS — M25551 Pain in right hip: Secondary | ICD-10-CM

## 2022-08-07 DIAGNOSIS — F411 Generalized anxiety disorder: Secondary | ICD-10-CM | POA: Diagnosis not present

## 2022-08-07 HISTORY — DX: Pneumonia, unspecified organism: J18.9

## 2022-08-07 HISTORY — DX: Atherosclerotic heart disease of native coronary artery without angina pectoris: I25.10

## 2022-08-07 HISTORY — DX: Gastro-esophageal reflux disease without esophagitis: K21.9

## 2022-08-07 HISTORY — PX: RADIOLOGY WITH ANESTHESIA: SHX6223

## 2022-08-07 SURGERY — MRI WITH ANESTHESIA
Anesthesia: General | Laterality: Right

## 2022-08-07 MED ORDER — ORAL CARE MOUTH RINSE: 15.0000 mL | Freq: Once | OROMUCOSAL | Status: AC

## 2022-08-07 MED ORDER — ONDANSETRON HCL 4 MG/2ML IJ SOLN
INTRAMUSCULAR | Status: DC | PRN
  Administered 2022-08-07: 4 mg via INTRAVENOUS

## 2022-08-07 MED ORDER — PHENYLEPHRINE 80 MCG/ML (10ML) SYRINGE FOR IV PUSH (FOR BLOOD PRESSURE SUPPORT)
PREFILLED_SYRINGE | INTRAVENOUS | Status: DC | PRN
  Administered 2022-08-07 (×4): 160 ug via INTRAVENOUS

## 2022-08-07 MED ORDER — FENTANYL CITRATE (PF) 100 MCG/2ML IJ SOLN: 100.0000 ug | Freq: Once | INTRAMUSCULAR | Status: AC

## 2022-08-07 MED ORDER — MIDAZOLAM HCL 2 MG/2ML IJ SOLN
INTRAMUSCULAR | Status: DC | PRN
  Administered 2022-08-07: 2 mg via INTRAVENOUS

## 2022-08-07 MED ORDER — FENTANYL CITRATE (PF) 100 MCG/2ML IJ SOLN
INTRAMUSCULAR | Status: AC
  Administered 2022-08-07: 100 ug via INTRAVENOUS
  Filled 2022-08-07: qty 2

## 2022-08-07 MED ORDER — PROPOFOL 10 MG/ML IV BOLUS
INTRAVENOUS | Status: DC | PRN
  Administered 2022-08-07: 120 mg via INTRAVENOUS

## 2022-08-07 MED ORDER — LIDOCAINE 2% (20 MG/ML) 5 ML SYRINGE
INTRAMUSCULAR | Status: DC | PRN
  Administered 2022-08-07: 100 mg via INTRAVENOUS

## 2022-08-07 MED ORDER — LACTATED RINGERS IV SOLN: INTRAVENOUS | Status: DC

## 2022-08-07 MED ORDER — CHLORHEXIDINE GLUCONATE 0.12 % MT SOLN
15.0000 mL | Freq: Once | OROMUCOSAL | Status: AC
  Administered 2022-08-07: 15 mL via OROMUCOSAL
  Filled 2022-08-07 (×2): qty 15

## 2022-08-07 NOTE — Anesthesia Procedure Notes (Signed)
Procedure Name: LMA Insertion Date/Time: 08/07/2022 5:45 PM  Performed by: Rande Brunt, CRNAPre-anesthesia Checklist: Patient identified, Emergency Drugs available, Suction available and Patient being monitored Patient Re-evaluated:Patient Re-evaluated prior to induction Oxygen Delivery Method: Circle System Utilized Preoxygenation: Pre-oxygenation with 100% oxygen Induction Type: IV induction Ventilation: Mask ventilation without difficulty LMA: LMA inserted LMA Size: 3.0 Number of attempts: 1 Airway Equipment and Method: Bite block Placement Confirmation: positive ETCO2 and CO2 detector Tube secured with: Tape Dental Injury: Teeth and Oropharynx as per pre-operative assessment

## 2022-08-07 NOTE — Anesthesia Postprocedure Evaluation (Signed)
Anesthesia Post Note  Patient: Carla Martinez  Procedure(s) Performed: MRI WITH RIGHT  HIP WITHOUT CONTRAST (Right)     Patient location during evaluation: PACU Anesthesia Type: General Level of consciousness: awake and alert Pain management: pain level controlled Vital Signs Assessment: post-procedure vital signs reviewed and stable Respiratory status: spontaneous breathing, nonlabored ventilation, respiratory function stable and patient connected to nasal cannula oxygen Cardiovascular status: blood pressure returned to baseline and stable Postop Assessment: no apparent nausea or vomiting Anesthetic complications: no   No notable events documented.  Last Vitals:  Vitals:   08/07/22 1849 08/07/22 1904  BP: 128/65 112/64  Pulse: 74 66  Resp: 15 15  Temp:  36.7 C  SpO2: 95% 99%    Last Pain:  Vitals:   08/07/22 1834  PainSc: 0-No pain                 Santa Lighter

## 2022-08-07 NOTE — Transfer of Care (Addendum)
Immediate Anesthesia Transfer of Care Note  Patient: Carla Martinez  Procedure(s) Performed: MRI WITH RIGHT  HIP WITHOUT CONTRAST (Right)  Patient Location: PACU  Anesthesia Type:General  Level of Consciousness: awake, alert  and oriented  Airway & Oxygen Therapy: Patient Spontanous Breathing  Post-op Assessment: Report given to RN, Post -op Vital signs reviewed and stable and Patient moving all extremities X 4  Post vital signs: Reviewed and stable  Last Vitals:  Vitals Value Taken Time  BP 112/64 08/07/22 1904  Temp 36.7 C 08/07/22 1904  Pulse 66 08/07/22 1904  Resp 15 08/07/22 1904  SpO2 99 % 08/07/22 1904    Last Pain:  Vitals:   08/07/22 1834  PainSc: 0-No pain         Complications: No notable events documented.

## 2022-08-07 NOTE — Progress Notes (Signed)
Per Dr. Gloris Manchester, labs collected on 06/28/22 are acceptable for today. No labs drawn.

## 2022-08-07 NOTE — Progress Notes (Signed)
Pt states her MRI should be performed on her Right Hip and Right Foot, even though the order says Right Hip only.

## 2022-08-08 ENCOUNTER — Encounter (HOSPITAL_COMMUNITY): Payer: Self-pay | Admitting: Radiology

## 2022-08-08 ENCOUNTER — Telehealth: Payer: Self-pay | Admitting: Nurse Practitioner

## 2022-08-08 NOTE — Telephone Encounter (Signed)
Will review once results are in and when I'm able to do so.

## 2022-08-08 NOTE — Telephone Encounter (Signed)
Carla Martinez is calling radiologist in regards to these results that are not finalized yet and then Carla Martinez will review them and result them

## 2022-08-08 NOTE — Telephone Encounter (Signed)
Pt called in request a call back regarding MRI results # [phone]

## 2022-08-11 ENCOUNTER — Telehealth: Payer: Self-pay | Admitting: Nurse Practitioner

## 2022-08-11 NOTE — Telephone Encounter (Signed)
Thanks for handling this. I did look in the chart and did not see that she went to an ED or ortho urgent care. Can we check with the patient please

## 2022-08-11 NOTE — Telephone Encounter (Signed)
Spoke with patient. She did not go anywhere over the weekend. Her right foot has been hurting more and swollen, she suppose to have MRI for the foot with [facility]. Hip pain is the same. Patient fell x 2 yesterday 08/10/22 due to loosing balance. Discussed the importance of been seen for both hip and foot and since she is established with [facility] in [place] to be seen at urgent care with them. Patient states that is the plan for today. She could not promise me that she would go today but that is the plan. I reiterated the importance of been seen and possible complications from not been evaluated especially with her still falling. Patient verbalized understanding and thanked me for the call.

## 2022-08-11 NOTE — Telephone Encounter (Signed)
Noted. Agree with her being evaluated at ortho today

## 2022-08-11 NOTE — Telephone Encounter (Signed)
-----   Message from [place] sent at 08/08/2022  4:46 PM EDT ----- Patient advised. Patient states pain is so much worse "thru the roof," and her foot has been in more pain today. She is going to go to ER for evaluation. Patient states her ortho is [facility] In [place] location.

## 2022-08-20 DIAGNOSIS — M79671 Pain in right foot: Secondary | ICD-10-CM | POA: Insufficient documentation

## 2022-08-28 ENCOUNTER — Other Ambulatory Visit: Payer: Self-pay

## 2022-08-28 ENCOUNTER — Encounter (HOSPITAL_COMMUNITY): Payer: Self-pay

## 2022-08-28 ENCOUNTER — Inpatient Hospital Stay (HOSPITAL_COMMUNITY): Payer: Self-pay

## 2022-08-28 ENCOUNTER — Emergency Department (HOSPITAL_COMMUNITY): Payer: Self-pay

## 2022-08-28 ENCOUNTER — Inpatient Hospital Stay (HOSPITAL_COMMUNITY)
Admission: EM | Admit: 2022-08-28 | Discharge: 2022-09-02 | DRG: 481 | Disposition: A | Discharge status: Home Health | Payer: Self-pay | Attending: Internal Medicine | Admitting: Internal Medicine

## 2022-08-28 DIAGNOSIS — R296 Repeated falls: Secondary | ICD-10-CM | POA: Diagnosis present

## 2022-08-28 DIAGNOSIS — L89151 Pressure ulcer of sacral region, stage 1: Secondary | ICD-10-CM | POA: Diagnosis present

## 2022-08-28 DIAGNOSIS — Z8249 Family history of ischemic heart disease and other diseases of the circulatory system: Secondary | ICD-10-CM

## 2022-08-28 DIAGNOSIS — F419 Anxiety disorder, unspecified: Secondary | ICD-10-CM | POA: Diagnosis present

## 2022-08-28 DIAGNOSIS — I480 Paroxysmal atrial fibrillation: Secondary | ICD-10-CM | POA: Diagnosis present

## 2022-08-28 DIAGNOSIS — S2231XA Fracture of one rib, right side, initial encounter for closed fracture: Secondary | ICD-10-CM

## 2022-08-28 DIAGNOSIS — F32A Depression, unspecified: Secondary | ICD-10-CM | POA: Diagnosis present

## 2022-08-28 DIAGNOSIS — Z806 Family history of leukemia: Secondary | ICD-10-CM

## 2022-08-28 DIAGNOSIS — F10221 Alcohol dependence with intoxication delirium: Secondary | ICD-10-CM | POA: Diagnosis present

## 2022-08-28 DIAGNOSIS — Z789 Other specified health status: Secondary | ICD-10-CM | POA: Diagnosis present

## 2022-08-28 DIAGNOSIS — Z20822 Contact with and (suspected) exposure to covid-19: Secondary | ICD-10-CM | POA: Diagnosis present

## 2022-08-28 DIAGNOSIS — Y92008 Other place in unspecified non-institutional (private) residence as the place of occurrence of the external cause: Secondary | ICD-10-CM

## 2022-08-28 DIAGNOSIS — Z8781 Personal history of (healed) traumatic fracture: Secondary | ICD-10-CM

## 2022-08-28 DIAGNOSIS — Z823 Family history of stroke: Secondary | ICD-10-CM

## 2022-08-28 DIAGNOSIS — Z833 Family history of diabetes mellitus: Secondary | ICD-10-CM

## 2022-08-28 DIAGNOSIS — D62 Acute posthemorrhagic anemia: Secondary | ICD-10-CM | POA: Diagnosis not present

## 2022-08-28 DIAGNOSIS — I2510 Atherosclerotic heart disease of native coronary artery without angina pectoris: Secondary | ICD-10-CM | POA: Diagnosis present

## 2022-08-28 DIAGNOSIS — Z79899 Other long term (current) drug therapy: Secondary | ICD-10-CM

## 2022-08-28 DIAGNOSIS — Z825 Family history of asthma and other chronic lower respiratory diseases: Secondary | ICD-10-CM

## 2022-08-28 DIAGNOSIS — F1010 Alcohol abuse, uncomplicated: Secondary | ICD-10-CM | POA: Diagnosis present

## 2022-08-28 DIAGNOSIS — F1090 Alcohol use, unspecified, uncomplicated: Secondary | ICD-10-CM | POA: Diagnosis present

## 2022-08-28 DIAGNOSIS — I5022 Chronic systolic (congestive) heart failure: Secondary | ICD-10-CM | POA: Diagnosis present

## 2022-08-28 DIAGNOSIS — I5181 Takotsubo syndrome: Secondary | ICD-10-CM | POA: Diagnosis present

## 2022-08-28 DIAGNOSIS — W010XXA Fall on same level from slipping, tripping and stumbling without subsequent striking against object, initial encounter: Secondary | ICD-10-CM | POA: Diagnosis present

## 2022-08-28 DIAGNOSIS — K219 Gastro-esophageal reflux disease without esophagitis: Secondary | ICD-10-CM | POA: Diagnosis present

## 2022-08-28 DIAGNOSIS — F17210 Nicotine dependence, cigarettes, uncomplicated: Secondary | ICD-10-CM | POA: Diagnosis present

## 2022-08-28 DIAGNOSIS — S72141A Displaced intertrochanteric fracture of right femur, initial encounter for closed fracture: Principal | ICD-10-CM | POA: Diagnosis present

## 2022-08-28 LAB — BASIC METABOLIC PANEL
Anion gap: 6 (ref 5–15)
BUN: 13 mg/dL (ref 6–20)
CO2: 27 mmol/L (ref 22–32)
Calcium: 7.9 mg/dL — ABNORMAL LOW (ref 8.9–10.3)
Chloride: 105 mmol/L (ref 98–111)
Creatinine, Ser: 0.77 mg/dL (ref 0.44–1.00)
GFR, Estimated: >60 mL/min (ref >60)
Glucose, Bld: 107 mg/dL — ABNORMAL HIGH (ref 70–99)
Potassium: 3.7 mmol/L (ref 3.5–5.1)
Sodium: 138 mmol/L (ref 135–145)

## 2022-08-28 LAB — CBC WITH DIFFERENTIAL/PLATELET
Abs Immature Granulocytes: 0.05 10*3/uL (ref 0.00–0.07)
Basophils Absolute: 0 10*3/uL (ref 0.0–0.1)
Basophils Relative: 0 %
Eosinophils Absolute: 0 10*3/uL (ref 0.0–0.5)
Eosinophils Relative: 0 %
HCT: 33.5 % — ABNORMAL LOW (ref 36.0–46.0)
Hemoglobin: 11 g/dL — ABNORMAL LOW (ref 12.0–15.0)
Immature Granulocytes: 0 %
Lymphocytes Relative: 20 %
Lymphs Abs: 2.4 10*3/uL (ref 0.7–4.0)
MCH: 28.6 pg (ref 26.0–34.0)
MCHC: 32.8 g/dL (ref 30.0–36.0)
MCV: 87 fL (ref 80.0–100.0)
Monocytes Absolute: 1 10*3/uL (ref 0.1–1.0)
Monocytes Relative: 8 %
Neutro Abs: 8.5 10*3/uL — ABNORMAL HIGH (ref 1.7–7.7)
Neutrophils Relative %: 72 %
Platelets: 393 10*3/uL (ref 150–400)
RBC: 3.85 MIL/uL — ABNORMAL LOW (ref 3.87–5.11)
RDW: 15.2 % (ref 11.5–15.5)
WBC: 11.9 10*3/uL — ABNORMAL HIGH (ref 4.0–10.5)
nRBC: 0 % (ref 0.0–0.2)

## 2022-08-28 LAB — PROTIME-INR
INR: 1 (ref 0.8–1.2)
Prothrombin Time: 13.1 seconds (ref 11.4–15.2)

## 2022-08-28 LAB — RESP PANEL BY RT-PCR (FLU A&B, COVID) ARPGX2
Influenza A by PCR: NEGATIVE
Influenza B by PCR: NEGATIVE
SARS Coronavirus 2 by RT PCR: NEGATIVE

## 2022-08-28 LAB — HIV ANTIBODY (ROUTINE TESTING W REFLEX): HIV Screen 4th Generation wRfx: NONREACTIVE

## 2022-08-28 MED ORDER — QUETIAPINE FUMARATE 100 MG PO TABS
300.0000 mg | ORAL_TABLET | Freq: Every day | ORAL | Status: DC
  Administered 2022-08-29 – 2022-09-01 (×5): 300 mg via ORAL
  Filled 2022-08-28 (×2): qty 3
  Filled 2022-08-28 (×2): qty 1
  Filled 2022-08-28: qty 3
  Filled 2022-08-28: qty 1

## 2022-08-28 MED ORDER — HYDROXYZINE HCL 25 MG PO TABS
50.0000 mg | ORAL_TABLET | Freq: Three times a day (TID) | ORAL | Status: DC | PRN
  Administered 2022-08-29 – 2022-09-01 (×7): 50 mg via ORAL
  Filled 2022-08-28 (×7): qty 2

## 2022-08-28 MED ORDER — BISACODYL 10 MG RE SUPP
10.0000 mg | Freq: Every day | RECTAL | Status: DC | PRN
  Administered 2022-09-01: 10 mg via RECTAL
  Filled 2022-08-28: qty 1

## 2022-08-28 MED ORDER — SODIUM CHLORIDE 0.9 % IV SOLN: INTRAVENOUS | Status: DC

## 2022-08-28 MED ORDER — ACETAMINOPHEN 325 MG PO TABS: 650.0000 mg | ORAL_TABLET | Freq: Four times a day (QID) | ORAL | Status: DC | PRN

## 2022-08-28 MED ORDER — ACETAMINOPHEN 650 MG RE SUPP: 650.0000 mg | Freq: Four times a day (QID) | RECTAL | Status: DC | PRN

## 2022-08-28 MED ORDER — ENOXAPARIN SODIUM 30 MG/0.3ML IJ SOSY
30.0000 mg | PREFILLED_SYRINGE | INTRAMUSCULAR | Status: DC
  Administered 2022-08-28 – 2022-08-29 (×2): 30 mg via SUBCUTANEOUS
  Filled 2022-08-28 (×2): qty 0.3

## 2022-08-28 MED ORDER — OXYCODONE-ACETAMINOPHEN 5-325 MG PO TABS
1.0000 | ORAL_TABLET | Freq: Once | ORAL | Status: AC
  Administered 2022-08-28: 1 via ORAL
  Filled 2022-08-28: qty 1

## 2022-08-28 MED ORDER — GABAPENTIN 300 MG PO CAPS
300.0000 mg | ORAL_CAPSULE | Freq: Three times a day (TID) | ORAL | Status: DC
  Administered 2022-08-28 – 2022-09-02 (×14): 300 mg via ORAL
  Filled 2022-08-28 (×14): qty 1

## 2022-08-28 MED ORDER — AMITRIPTYLINE HCL 50 MG PO TABS
100.0000 mg | ORAL_TABLET | Freq: Every day | ORAL | Status: DC
  Administered 2022-08-28 – 2022-09-01 (×5): 100 mg via ORAL
  Filled 2022-08-28: qty 2
  Filled 2022-08-28 (×2): qty 4
  Filled 2022-08-28: qty 2
  Filled 2022-08-28: qty 4
  Filled 2022-08-28: qty 2

## 2022-08-28 MED ORDER — HYDROCODONE-ACETAMINOPHEN 5-325 MG PO TABS
1.0000 | ORAL_TABLET | ORAL | Status: DC | PRN
  Administered 2022-08-28 – 2022-08-30 (×6): 2 via ORAL
  Administered 2022-08-31: 1 via ORAL
  Administered 2022-09-01: 2 via ORAL
  Administered 2022-09-01: 1 via ORAL
  Administered 2022-09-02 (×2): 2 via ORAL
  Administered 2022-09-02: 1 via ORAL
  Filled 2022-08-28 (×10): qty 2
  Filled 2022-08-28 (×2): qty 1

## 2022-08-28 MED ORDER — HYDROMORPHONE HCL 1 MG/ML IJ SOLN
1.0000 mg | Freq: Once | INTRAMUSCULAR | Status: AC
  Administered 2022-08-28: 1 mg via INTRAVENOUS
  Filled 2022-08-28: qty 1

## 2022-08-28 MED ORDER — POLYETHYLENE GLYCOL 3350 17 G PO PACK: 17.0000 g | PACK | Freq: Every day | ORAL | Status: DC | PRN

## 2022-08-28 MED ORDER — PANTOPRAZOLE SODIUM 40 MG PO TBEC
40.0000 mg | DELAYED_RELEASE_TABLET | Freq: Every day | ORAL | Status: DC
  Administered 2022-08-28 – 2022-09-02 (×6): 40 mg via ORAL
  Filled 2022-08-28 (×6): qty 1

## 2022-08-28 NOTE — ED Provider Notes (Signed)
[place] DEPT Provider Note   CSN: [id number] Arrival date & time: 08/28/22  1454     History  Chief Complaint  Patient presents with   Hip Pain    Carla Martinez is a 61 y.o. female with a past medical history of alcohol use disorder, CHF, chronic right hip pain presenting today due to a fall.  She has a recent foot fracture and has been using crutches but this time she was not using her crutches and had a mechanical fall onto her right hip.  Also complaining of right rib cage pain   Hip Pain       Home Medications Prior to Admission medications   Medication Sig Start Date End Date Taking? Authorizing Provider  amitriptyline (ELAVIL) 100 MG tablet Take 1 tablet (100 mg total) by mouth at bedtime. 07/08/22   Nwoko, Terese Door, PA  B Complex-C (SUPER B COMPLEX PO) Take 1 tablet by mouth daily.    [provider]  calcium carbonate (OS-CAL - DOSED IN MG OF ELEMENTAL CALCIUM) 1250 (500 Ca) MG tablet Take 1 tablet by mouth 2 (two) times daily with a meal.    [provider]  gabapentin (NEURONTIN) 300 MG capsule Take 1 capsule (300 mg total) by mouth 3 (three) times daily. 07/08/22   Nwoko, Terese Door, PA  hydrOXYzine (ATARAX) 50 MG tablet Take 1 tablet (50 mg total) by mouth 3 (three) times daily as needed. 07/08/22   Nwoko, Terese Door, PA  Magnesium 250 MG TABS Take 250 mg by mouth daily.    [provider]  meloxicam (MOBIC) 7.5 MG tablet Take 1 tablet (7.5 mg total) by mouth daily. Patient not taking: Reported on 07/28/2022 03/10/22   Waunita Schooner, MD  metoprolol succinate (TOPROL-XL) 25 MG 24 hr tablet Take 0.5 tablets (12.5 mg total) by mouth daily. Patient not taking: Reported on 07/28/2022 12/11/21   Waunita Schooner, MD  pantoprazole (PROTONIX) 40 MG tablet Take 1 tablet by mouth once daily 06/09/22   Waunita Schooner, MD  QUEtiapine (SEROQUEL) 300 MG tablet Take 1 tablet (300 mg total) by mouth at bedtime. 07/08/22   Nwoko,  Terese Door, PA  sucralfate (CARAFATE) 1 GM/10ML suspension Take 10 mLs (1 g total) by mouth 4 (four) times daily -  with meals and at bedtime. Patient not taking: Reported on 07/28/2022 06/28/22   Palumbo, April, MD  TURMERIC PO Take 500 mg by mouth daily. w [name]    [provider]      Allergies    Patient has no known allergies.    Review of Systems   Review of Systems  Physical Exam Updated Vital Signs BP 129/76 (BP Location: Right Arm)   Pulse 95   Temp 98.8 F (37.1 C) (Oral)   Resp 16   Ht '5\' 3"'$  (1.6 m)   Wt 45.4 kg   SpO2 96%   BMI 17.71 kg/m  Physical Exam Vitals and nursing note reviewed.  Constitutional:      Appearance: Normal appearance.  HENT:     Head: Normocephalic and atraumatic.  Eyes:     General: No scleral icterus.    Conjunctiva/sclera: Conjunctivae normal.  Cardiovascular:     Rate and Rhythm: Normal rate and regular rhythm.     Pulses: Normal pulses.  Pulmonary:     Effort: Pulmonary effort is normal. No respiratory distress.  Abdominal:     General: Abdomen is flat.     Palpations: Abdomen is soft.  Tenderness: There is no abdominal tenderness.     Comments: No brusing  Musculoskeletal:     Comments: Shortened and externally rotated right lower extremity.  Strong DP pulse.  Able to range the MTPs and ankle.  Severe tenderness over the proximal femur  Tenderness on right lateral rib cage  Skin:    General: Skin is warm and dry.     Findings: No rash.  Neurological:     Mental Status: She is alert.  Psychiatric:        Mood and Affect: Mood normal.     ED Results / Procedures / Treatments   Labs (all labs ordered are listed, but only abnormal results are displayed) Labs Reviewed - No data to display   EKG None  Radiology DG Chest 1 View  Result Date: 08/28/2022 CLINICAL DATA:  Fall. EXAM: CHEST  1 VIEW COMPARISON:  June 28, 2022. FINDINGS: The heart size and mediastinal contours are within normal limits. Both  lungs are clear. Nondisplaced right sixth rib fracture is noted. IMPRESSION: No acute cardiopulmonary abnormality seen. Nondisplaced right sixth rib fracture. Electronically Signed   By: Marijo Conception M.D.   On: 08/28/2022 16:13   DG Foot Complete Right  Result Date: 08/28/2022 CLINICAL DATA:  Right foot pain after fall. EXAM: RIGHT FOOT COMPLETE - 3+ VIEW COMPARISON:  None Available. FINDINGS: There is no evidence of fracture or dislocation. There is no evidence of arthropathy or other focal bone abnormality. Soft tissues are unremarkable. IMPRESSION: Negative. Electronically Signed   By: Marijo Conception M.D.   On: 08/28/2022 16:11   DG HIP UNILAT WITH PELVIS 2-3 VIEWS RIGHT  Result Date: 08/28/2022 CLINICAL DATA:  Right hip pain after fall. EXAM: DG HIP (WITH OR WITHOUT PELVIS) 2-3V RIGHT COMPARISON:  None Available. FINDINGS: Severely displaced and comminuted fracture is seen involving the intertrochanteric region of the proximal right femur. IMPRESSION: Severely displaced and comminuted intertrochanteric fracture of the proximal right femur. Electronically Signed   By: Marijo Conception M.D.   On: 08/28/2022 16:10   DG Knee 1-2 Views Right  Result Date: 08/28/2022 CLINICAL DATA:  Right knee pain after fall. EXAM: RIGHT KNEE - 1-2 VIEW COMPARISON:  None Available. FINDINGS: No evidence of fracture, dislocation, or joint effusion. No evidence of arthropathy or other focal bone abnormality. Soft tissues are unremarkable. IMPRESSION: Negative. Electronically Signed   By: Marijo Conception M.D.   On: 08/28/2022 16:09    Procedures Procedures   Medications Ordered in ED Medications  oxyCODONE-acetaminophen (PERCOCET/ROXICET) 5-325 MG per tablet 1 tablet (1 tablet Oral Given 08/28/22 1608)    ED Course/ Medical Decision Making/ A&P Clinical Course as of 08/28/22 1736  Thu Aug 28, 2022  1732 Attending spoke with the patient who ultimately was proceeded to stay. [MR]    Clinical Course User  Index [MR] Tekia Waterbury, Cecilio Asper, PA-C                           Medical Decision Making Amount and/or Complexity of Data Reviewed Labs: ordered. Radiology: ordered.  Risk Prescription drug management. Decision regarding hospitalization.   61 year old female presenting after a fall. Differential for the fall includes but is not limited to arrhythmia, seizure, syncope, CVA, hypoglycemia, orthostasis, vasovagal, intoxication.   This is not an exhaustive differential.    Past Medical History / Co-morbidities / Social History: Alcohol use disorder, chronic right hip and right back pain  Physical Exam: Pertinent physical exam findings include Shortened and externally rotated right lower extremity.  Strong DP pulse.  Able to range the MTPs and ankle.  Severe tenderness over the proximal femur  Lab Tests: Patient declining surgery, no labs indicated at this time   Imaging Studies: I ordered and independently visualized and interpreted the following Right hip film: Right intertrochanteric proximal femur fracture.  Severely displaced and comminuted Nondisplaced right sixth rib fracture    Medications: I ordered medication including Percocet. Reevaluation of the patient after these medicines showed that the patient improved.    Consultations Obtained: I spoke with Dr. Kathaleen Bury with orthopedics who recommends admission and surgery  MDM/Disposition: 61 year old female presenting after fall.  We discussed multiple etiologies of the fall however I do believe it was just mechanical as she endorses.  Work-up revealing of a right intertrochanteric fracture of the proximal femur.  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  I spoke about this and she says that she will not stay but she "needs 2 weeks worth of pain medication."  I told her I would not be able to provide this  and she said "at least 1 week."  Per PDMP review patient feels like it and monthly.  Also has a history of alcohol use disorder, recent visit for substance use.  I am not willing to discharge her with a large prescription of narcotics.  She was made aware of this and she continues to refuse admission.  She understands that she may lose her leg or ultimately her life from vascular compromise.  5:30P: My attending physician went and spoke with the patient and ultimately she decided she wanted to stay. Patient will remain NPO.     I discussed this case with my attending physician Dr. Zenia Resides who cosigned this note including patient's presenting symptoms, physical exam, and planned diagnostics and interventions. Attending physician stated agreement with plan or made changes to plan which were implemented.     Final Clinical Impression(s) / ED Diagnoses Final diagnoses:  Closed fracture of one rib of right side, initial encounter  Intertrochanteric fracture of right femur, closed, initial encounter [facility])    Rx / DC Orders ED Discharge Orders     None      Admit to Dr. Danae Orleans, [place], PA-C 08/28/22 1754    Lacretia Leigh, MD 09/01/22 1429
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
## 2022-08-28 NOTE — Discharge Instructions (Addendum)
ORTHOPEDIC DISCHARGE INSTRUCTIONS:   - Weightbearing : you are weightbearing as tolerated may need to put full weight on that leg as tolerated.  No restrictions with weight but you should use a walker to help ambulate.   Work on bending your knee fully bent and fully straight.   - Blood clot prevention:'81mg'$  of aspirin daily for prevention.  You should buy the aspirin over-the-counter. If you get severe calf pain, swelling and foot swelling that persists all the office as we may need to get an ultrasound to rule out a blood clot which can form in the calf.   - Dressings : Keep dressings on for 7 days after surgery. You can remove them and shower but no baths or submerging. Keep incision covered for the next 2 weeks.  You have staples keeping your wound closed so it can heal, these will be removed at your 2 week visit in the office.   - Follow up at [facility] and call [phone] to make an appointment with Jonelle [facility]le PA-C 2 weeks from surgery around 09/16/22. Call the office for any other concerns such as fever over 101.4, drainage, severe pain out of proportion.

## 2022-08-28 NOTE — Plan of Care (Signed)
  Problem: Pain Managment: Goal: General experience of comfort will improve Outcome: Progressing   
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
## 2022-08-28 NOTE — ED Provider Notes (Signed)
I provided a substantive portion of the care of this patient.  I personally performed the entirety of the medical decision making for this encounter.     61 year old woman here complaining of hip pain after mechanical fall.  Has evidence of a right hip fracture.  Will consult orthopedics and admit to the hospital service   Lacretia Leigh, MD 08/28/22 1736

## 2022-08-28 NOTE — ED Provider Triage Note (Signed)
Emergency Medicine Provider Triage Evaluation Note  Carla Martinez , a 61 y.o. female  was evaluated in triage.  Pt complains of right hip pain after fall.  Patient has a recent fracture to the right foot and is supposed to be using crutches.  She was trying to maneuver without the crutches earlier today and had a mechanical fall.  Fell onto her right hip and is having severe pain.  She was unable to move her leg or stand up so she crawled around until she can find someone to help her.  Review of Systems  Positive:  Negative:   Physical Exam  BP 129/76 (BP Location: Right Arm)   Pulse 95   Temp 98.8 F (37.1 C) (Oral)   Resp 16   Ht '5\' 3"'$  (1.6 m)   Wt 45.4 kg   SpO2 96%   BMI 17.71 kg/m  Gen:   Awake, no distress   Resp:  Normal effort  MSK:   Moves extremities without difficulty  Other:  Shortened and externally rotated right lower extremity however she also is not extending her left lower extremity.  Strong DP pulse bilaterally.  Medical Decision Making  Medically screening exam initiated at 3:14 PM.  Appropriate orders placed.  Silvina Hackleman was informed that the remainder of the evaluation will be completed by another provider, this initial triage assessment does not replace that evaluation, and the importance of remaining in the ED until their evaluation is complete.  Pelvis and right knee and ankle   Kayla Weekes A, PA-C 08/28/22 1517

## 2022-08-29 ENCOUNTER — Inpatient Hospital Stay (HOSPITAL_COMMUNITY): Payer: Self-pay | Admitting: Anesthesiology

## 2022-08-29 LAB — COMPREHENSIVE METABOLIC PANEL
ALT: 15 U/L (ref 0–44)
AST: 18 U/L (ref 15–41)
Albumin: 2.8 g/dL — ABNORMAL LOW (ref 3.5–5.0)
Alkaline Phosphatase: 138 U/L — ABNORMAL HIGH (ref 38–126)
Anion gap: 8 (ref 5–15)
BUN: 14 mg/dL (ref 6–20)
CO2: 25 mmol/L (ref 22–32)
Calcium: 8.6 mg/dL — ABNORMAL LOW (ref 8.9–10.3)
Chloride: 102 mmol/L (ref 98–111)
Creatinine, Ser: 0.81 mg/dL (ref 0.44–1.00)
GFR, Estimated: >60 mL/min (ref >60)
Glucose, Bld: 119 mg/dL — ABNORMAL HIGH (ref 70–99)
Potassium: 3.8 mmol/L (ref 3.5–5.1)
Sodium: 135 mmol/L (ref 135–145)
Total Bilirubin: 0.7 mg/dL (ref 0.3–1.2)
Total Protein: 5.5 g/dL — ABNORMAL LOW (ref 6.5–8.1)

## 2022-08-29 LAB — CBC
HCT: 29.3 % — ABNORMAL LOW (ref 36.0–46.0)
Hemoglobin: 9.4 g/dL — ABNORMAL LOW (ref 12.0–15.0)
MCH: 28.5 pg (ref 26.0–34.0)
MCHC: 32.1 g/dL (ref 30.0–36.0)
MCV: 88.8 fL (ref 80.0–100.0)
Platelets: 318 10*3/uL (ref 150–400)
RBC: 3.3 MIL/uL — ABNORMAL LOW (ref 3.87–5.11)
RDW: 15.1 % (ref 11.5–15.5)
WBC: 9.4 10*3/uL (ref 4.0–10.5)
nRBC: 0 % (ref 0.0–0.2)

## 2022-08-29 LAB — HIV ANTIBODY (ROUTINE TESTING W REFLEX): HIV Screen 4th Generation wRfx: NONREACTIVE

## 2022-08-29 LAB — SURGICAL PCR SCREEN
MRSA, PCR: POSITIVE — AB
Staphylococcus aureus: POSITIVE — AB

## 2022-08-29 MED ORDER — MIDAZOLAM HCL 2 MG/2ML IJ SOLN
1.0000 mg | INTRAMUSCULAR | Status: DC
  Administered 2022-08-29: 2 mg via INTRAVENOUS

## 2022-08-29 MED ORDER — FENTANYL CITRATE PF 50 MCG/ML IJ SOSY
PREFILLED_SYRINGE | INTRAMUSCULAR | Status: AC
  Filled 2022-08-29: qty 2

## 2022-08-29 MED ORDER — HYDROMORPHONE HCL 1 MG/ML IJ SOLN
0.5000 mg | INTRAMUSCULAR | Status: DC | PRN
  Administered 2022-08-29 (×3): 0.5 mg via INTRAVENOUS
  Filled 2022-08-29 (×3): qty 0.5

## 2022-08-29 MED ORDER — FENTANYL CITRATE PF 50 MCG/ML IJ SOSY
50.0000 ug | PREFILLED_SYRINGE | INTRAMUSCULAR | Status: DC
  Administered 2022-08-29: 50 ug via INTRAVENOUS

## 2022-08-29 MED ORDER — MIDAZOLAM HCL 2 MG/2ML IJ SOLN
INTRAMUSCULAR | Status: AC
  Filled 2022-08-29: qty 2

## 2022-08-29 MED ORDER — ROPIVACAINE HCL 5 MG/ML IJ SOLN
INTRAMUSCULAR | Status: DC | PRN
  Administered 2022-08-29: 30 mL via PERINEURAL

## 2022-08-29 MED ORDER — MUPIROCIN 2 % EX OINT
1.0000 | TOPICAL_OINTMENT | Freq: Two times a day (BID) | CUTANEOUS | Status: DC
  Administered 2022-08-29 – 2022-09-02 (×8): 1 via NASAL
  Filled 2022-08-29 (×5): qty 22

## 2022-08-29 MED ORDER — CHLORHEXIDINE GLUCONATE CLOTH 2 % EX PADS
6.0000 | MEDICATED_PAD | Freq: Every day | CUTANEOUS | Status: DC
  Administered 2022-08-30 – 2022-08-31 (×2): 6 via TOPICAL

## 2022-08-29 NOTE — Anesthesia Postprocedure Evaluation (Signed)
Anesthesia Post Note  Patient: Carla Martinez  Procedure(s) Performed: AN AD Teller     Patient location during evaluation: PACU Anesthesia Type: Regional Level of consciousness: awake and alert Pain management: pain level controlled Vital Signs Assessment: post-procedure vital signs reviewed and stable Respiratory status: spontaneous breathing, nonlabored ventilation, respiratory function stable and patient connected to nasal cannula oxygen Cardiovascular status: stable and blood pressure returned to baseline Postop Assessment: no apparent nausea or vomiting Anesthetic complications: no Comments: Pain improving (1/10). See RN for vitals.   No notable events documented.  Last Vitals:  Vitals:   08/29/22 1418 08/29/22 1419  BP:    Pulse: 94 93  Resp: 15 10  Temp:    SpO2: 97% 95%                 Effie Berkshire

## 2022-08-29 NOTE — Progress Notes (Signed)
Dorris Nancie Neas removed as emergency contact for patient per patient request. Patient states "I don't want my mom or anyone else listed in my chart, I want any information about me to come directly from me".  Patient does not want anyone listed as a contact in her chart.

## 2022-08-29 NOTE — Progress Notes (Signed)
Report given to Nira Conn, RN at [facility]), patient belongings all packed up and room double checked, surgical consent and all paperwork placed in envelope and ready for carelink, patient sleeping at this time in no acute distress, will continue to monitor.
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
## 2022-08-29 NOTE — Anesthesia Procedure Notes (Signed)
Anesthesia Regional Block: Femoral nerve block   Pre-Anesthetic Checklist: , timeout performed,  Correct Patient, Correct Site, Correct Laterality,  Correct Procedure, Correct Position, site marked,  Risks and benefits discussed,  Surgical consent,  Pre-op evaluation,  At surgeon's request and post-op pain management  Laterality: Right  Prep: chloraprep       Needles:  Injection technique: Single-shot  Needle Type: Echogenic Stimulator Needle     Needle Length: 9cm  Needle Gauge: 21     Additional Needles:   Procedures:,,,, ultrasound used (permanent image in chart),,    Narrative:  Start time: 08/29/2022 2:23 PM End time: 08/29/2022 2:28 PM Injection made incrementally with aspirations every 5 mL.  Performed by: Personally  Anesthesiologist: Effie Berkshire, MD  Additional Notes: Discussed risks and benefits of the nerve block in detail, including but not limited vascular injury, permanent nerve damage and infection.   Patient tolerated the procedure well. Local anesthetic introduced in an incremental fashion under minimal resistance after negative aspirations. No paresthesias were elicited. After completion of the procedure, no acute issues were identified and patient continued to be monitored by RN.

## 2022-08-29 NOTE — Plan of Care (Signed)
  Problem: Education: Goal: Knowledge of General Education information will improve Description Including pain rating scale, medication(s)/side effects and non-pharmacologic comfort measures Outcome: Progressing   

## 2022-08-29 NOTE — Anesthesia Preprocedure Evaluation (Addendum)
Anesthesia Evaluation  Patient identified by MRN, date of birth, ID band Patient awake    Reviewed: Allergy & Precautions, Patient's Chart, lab work & pertinent test results, reviewed documented beta blocker date and time   Airway Mallampati: I       Dental  (+) Upper Dentures, Lower Dentures   Pulmonary Current Smoker and Patient abstained from smoking.   breath sounds clear to auscultation       Cardiovascular + CAD and +CHF  + dysrhythmias Atrial Fibrillation  Rhythm:Regular Rate:Normal     Neuro/Psych  PSYCHIATRIC DISORDERS Anxiety Depression       GI/Hepatic Neg liver ROS,GERD  Medicated,,  Endo/Other  negative endocrine ROS    Renal/GU negative Renal ROS     Musculoskeletal negative musculoskeletal ROS (+)    Abdominal   Peds  Hematology negative hematology ROS (+)   Anesthesia Other Findings   Reproductive/Obstetrics                             Anesthesia Physical Anesthesia Plan  ASA: 3  Anesthesia Plan: Regional   Post-op Pain Management:    Induction:   PONV Risk Score and Plan: 0  Airway Management Planned: Natural Airway and Nasal Cannula  Additional Equipment: None  Intra-op Plan:   Post-operative Plan:   Informed Consent: I have reviewed the patients History and Physical, chart, labs and discussed the procedure including the risks, benefits and alternatives for the proposed anesthesia with the patient or authorized representative who has indicated his/her understanding and acceptance.       Plan Discussed with:   Anesthesia Plan Comments: (Confirmed R hip fracture by xray. )       Anesthesia Quick Evaluation
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
## 2022-08-29 NOTE — Progress Notes (Signed)
Patient has been transported to the O.R. for nerve block.

## 2022-08-30 ENCOUNTER — Encounter (HOSPITAL_COMMUNITY): Payer: Self-pay | Admitting: Family Medicine

## 2022-08-30 ENCOUNTER — Inpatient Hospital Stay (HOSPITAL_COMMUNITY): Payer: Self-pay | Admitting: Anesthesiology

## 2022-08-30 ENCOUNTER — Inpatient Hospital Stay (HOSPITAL_COMMUNITY): Payer: Self-pay

## 2022-08-30 ENCOUNTER — Encounter (HOSPITAL_COMMUNITY)
Admission: EM | Disposition: A | Discharge status: Home Health | Payer: Self-pay | Source: Home / Self Care | Attending: Family Medicine

## 2022-08-30 DIAGNOSIS — S72141A Displaced intertrochanteric fracture of right femur, initial encounter for closed fracture: Secondary | ICD-10-CM

## 2022-08-30 DIAGNOSIS — S7221XA Displaced subtrochanteric fracture of right femur, initial encounter for closed fracture: Secondary | ICD-10-CM

## 2022-08-30 DIAGNOSIS — Z789 Other specified health status: Secondary | ICD-10-CM

## 2022-08-30 DIAGNOSIS — I2510 Atherosclerotic heart disease of native coronary artery without angina pectoris: Secondary | ICD-10-CM

## 2022-08-30 DIAGNOSIS — F17210 Nicotine dependence, cigarettes, uncomplicated: Secondary | ICD-10-CM

## 2022-08-30 HISTORY — PX: INTRAMEDULLARY (IM) NAIL INTERTROCHANTERIC: SHX5875

## 2022-08-30 LAB — CBC
HCT: 23 % — ABNORMAL LOW (ref 36.0–46.0)
Hemoglobin: 7.3 g/dL — ABNORMAL LOW (ref 12.0–15.0)
MCH: 28.2 pg (ref 26.0–34.0)
MCHC: 31.7 g/dL (ref 30.0–36.0)
MCV: 88.8 fL (ref 80.0–100.0)
Platelets: 270 10*3/uL (ref 150–400)
RBC: 2.59 MIL/uL — ABNORMAL LOW (ref 3.87–5.11)
RDW: 15 % (ref 11.5–15.5)
WBC: 11.5 10*3/uL — ABNORMAL HIGH (ref 4.0–10.5)
nRBC: 0 % (ref 0.0–0.2)

## 2022-08-30 LAB — CREATININE, SERUM
Creatinine, Ser: 0.84 mg/dL (ref 0.44–1.00)
GFR, Estimated: >60 mL/min (ref >60)

## 2022-08-30 SURGERY — FIXATION, FRACTURE, INTERTROCHANTERIC, WITH INTRAMEDULLARY ROD
Anesthesia: Choice | Laterality: Right

## 2022-08-30 MED ORDER — ROCURONIUM BROMIDE 10 MG/ML (PF) SYRINGE
PREFILLED_SYRINGE | INTRAVENOUS | Status: DC | PRN
  Administered 2022-08-30: 70 mg via INTRAVENOUS

## 2022-08-30 MED ORDER — OXYCODONE HCL 5 MG/5ML PO SOLN: 5.0000 mg | Freq: Once | ORAL | Status: DC | PRN

## 2022-08-30 MED ORDER — OXYCODONE HCL 5 MG PO TABS: 5.0000 mg | ORAL_TABLET | Freq: Once | ORAL | Status: DC | PRN

## 2022-08-30 MED ORDER — ENOXAPARIN SODIUM 40 MG/0.4ML IJ SOSY
40.0000 mg | PREFILLED_SYRINGE | INTRAMUSCULAR | Status: DC
  Administered 2022-08-31 – 2022-09-02 (×3): 40 mg via SUBCUTANEOUS
  Filled 2022-08-30 (×3): qty 0.4

## 2022-08-30 MED ORDER — MIDAZOLAM HCL 2 MG/2ML IJ SOLN
INTRAMUSCULAR | Status: AC
  Filled 2022-08-30: qty 2

## 2022-08-30 MED ORDER — FENTANYL CITRATE (PF) 100 MCG/2ML IJ SOLN: 25.0000 ug | INTRAMUSCULAR | Status: DC | PRN

## 2022-08-30 MED ORDER — LIDOCAINE 2% (20 MG/ML) 5 ML SYRINGE
INTRAMUSCULAR | Status: AC
  Filled 2022-08-30: qty 5

## 2022-08-30 MED ORDER — NICOTINE 7 MG/24HR TD PT24
7.0000 mg | MEDICATED_PATCH | Freq: Every day | TRANSDERMAL | Status: AC
  Administered 2022-08-31: 7 mg via TRANSDERMAL
  Filled 2022-08-30: qty 1

## 2022-08-30 MED ORDER — HYDROMORPHONE HCL 1 MG/ML IJ SOLN
0.5000 mg | INTRAMUSCULAR | Status: DC | PRN
  Administered 2022-08-30 – 2022-08-31 (×4): 0.5 mg via INTRAVENOUS
  Filled 2022-08-30 (×4): qty 0.5

## 2022-08-30 MED ORDER — PHENYLEPHRINE 80 MCG/ML (10ML) SYRINGE FOR IV PUSH (FOR BLOOD PRESSURE SUPPORT)
PREFILLED_SYRINGE | INTRAVENOUS | Status: DC | PRN
  Administered 2022-08-30: 160 ug via INTRAVENOUS
  Administered 2022-08-30: 240 ug via INTRAVENOUS

## 2022-08-30 MED ORDER — METOCLOPRAMIDE HCL 5 MG PO TABS: 5.0000 mg | ORAL_TABLET | Freq: Three times a day (TID) | ORAL | Status: DC | PRN

## 2022-08-30 MED ORDER — ACETAMINOPHEN 500 MG PO TABS: 1000.0000 mg | ORAL_TABLET | Freq: Once | ORAL | Status: DC | PRN

## 2022-08-30 MED ORDER — ALBUMIN HUMAN 5 % IV SOLN: INTRAVENOUS | Status: DC | PRN

## 2022-08-30 MED ORDER — PROPOFOL 10 MG/ML IV BOLUS
INTRAVENOUS | Status: AC
  Filled 2022-08-30: qty 20

## 2022-08-30 MED ORDER — POVIDONE-IODINE 10 % EX SWAB: 2.0000 | Freq: Once | CUTANEOUS | Status: DC

## 2022-08-30 MED ORDER — CEFAZOLIN SODIUM-DEXTROSE 2-4 GM/100ML-% IV SOLN
2.0000 g | Freq: Four times a day (QID) | INTRAVENOUS | Status: AC
  Administered 2022-08-30 – 2022-08-31 (×2): 2 g via INTRAVENOUS
  Filled 2022-08-30 (×2): qty 100

## 2022-08-30 MED ORDER — ACETAMINOPHEN 500 MG PO TABS
1000.0000 mg | ORAL_TABLET | Freq: Once | ORAL | Status: AC
  Administered 2022-08-30: 1000 mg via ORAL
  Filled 2022-08-30: qty 2

## 2022-08-30 MED ORDER — EPHEDRINE 5 MG/ML INJ
INTRAVENOUS | Status: AC
  Filled 2022-08-30: qty 5

## 2022-08-30 MED ORDER — MIDAZOLAM HCL 2 MG/2ML IJ SOLN
INTRAMUSCULAR | Status: DC | PRN
  Administered 2022-08-30: 2 mg via INTRAVENOUS

## 2022-08-30 MED ORDER — ROCURONIUM BROMIDE 10 MG/ML (PF) SYRINGE
PREFILLED_SYRINGE | INTRAVENOUS | Status: AC
  Filled 2022-08-30: qty 10

## 2022-08-30 MED ORDER — PHENOL 1.4 % MT LIQD: 1.0000 | OROMUCOSAL | Status: DC | PRN

## 2022-08-30 MED ORDER — CEFAZOLIN SODIUM-DEXTROSE 2-4 GM/100ML-% IV SOLN
2.0000 g | INTRAVENOUS | Status: AC
  Administered 2022-08-30: 2 g via INTRAVENOUS
  Filled 2022-08-30: qty 100

## 2022-08-30 MED ORDER — LIDOCAINE 2% (20 MG/ML) 5 ML SYRINGE
INTRAMUSCULAR | Status: DC | PRN
  Administered 2022-08-30: 50 mg via INTRAVENOUS

## 2022-08-30 MED ORDER — LACTATED RINGERS IV SOLN: INTRAVENOUS | Status: DC

## 2022-08-30 MED ORDER — PHENYLEPHRINE HCL-NACL 20-0.9 MG/250ML-% IV SOLN
INTRAVENOUS | Status: DC | PRN
  Administered 2022-08-30: 30 ug/min via INTRAVENOUS

## 2022-08-30 MED ORDER — 0.9 % SODIUM CHLORIDE (POUR BTL) OPTIME
TOPICAL | Status: DC | PRN
  Administered 2022-08-30: 1000 mL

## 2022-08-30 MED ORDER — FENTANYL CITRATE (PF) 250 MCG/5ML IJ SOLN
INTRAMUSCULAR | Status: AC
  Filled 2022-08-30: qty 5

## 2022-08-30 MED ORDER — MENTHOL 3 MG MT LOZG: 1.0000 | LOZENGE | OROMUCOSAL | Status: DC | PRN

## 2022-08-30 MED ORDER — ONDANSETRON HCL 4 MG/2ML IJ SOLN
INTRAMUSCULAR | Status: DC | PRN
  Administered 2022-08-30: 4 mg via INTRAVENOUS

## 2022-08-30 MED ORDER — CHLORHEXIDINE GLUCONATE 0.12 % MT SOLN: 15.0000 mL | Freq: Once | OROMUCOSAL | Status: AC

## 2022-08-30 MED ORDER — ONDANSETRON HCL 4 MG/2ML IJ SOLN
INTRAMUSCULAR | Status: AC
  Filled 2022-08-30: qty 2

## 2022-08-30 MED ORDER — ACETAMINOPHEN 10 MG/ML IV SOLN: 1000.0000 mg | Freq: Once | INTRAVENOUS | Status: DC | PRN

## 2022-08-30 MED ORDER — SUGAMMADEX SODIUM 200 MG/2ML IV SOLN
INTRAVENOUS | Status: DC | PRN
  Administered 2022-08-30: 90 mg via INTRAVENOUS

## 2022-08-30 MED ORDER — CHLORHEXIDINE GLUCONATE 4 % EX LIQD
60.0000 mL | Freq: Once | CUTANEOUS | Status: DC
  Filled 2022-08-30: qty 15

## 2022-08-30 MED ORDER — METOCLOPRAMIDE HCL 5 MG/ML IJ SOLN: 5.0000 mg | Freq: Three times a day (TID) | INTRAMUSCULAR | Status: DC | PRN

## 2022-08-30 MED ORDER — ACETAMINOPHEN 160 MG/5ML PO SOLN: 1000.0000 mg | Freq: Once | ORAL | Status: DC | PRN

## 2022-08-30 MED ORDER — TRANEXAMIC ACID-NACL 1000-0.7 MG/100ML-% IV SOLN
1000.0000 mg | Freq: Once | INTRAVENOUS | Status: AC
  Administered 2022-08-30: 1000 mg via INTRAVENOUS
  Filled 2022-08-30: qty 100

## 2022-08-30 MED ORDER — DOCUSATE SODIUM 100 MG PO CAPS
100.0000 mg | ORAL_CAPSULE | Freq: Two times a day (BID) | ORAL | Status: DC
  Administered 2022-08-30 – 2022-09-02 (×7): 100 mg via ORAL
  Filled 2022-08-30 (×7): qty 1

## 2022-08-30 MED ORDER — PROPOFOL 10 MG/ML IV BOLUS
INTRAVENOUS | Status: DC | PRN
  Administered 2022-08-30: 90 mg via INTRAVENOUS

## 2022-08-30 MED ORDER — FENTANYL CITRATE (PF) 250 MCG/5ML IJ SOLN
INTRAMUSCULAR | Status: DC | PRN
  Administered 2022-08-30: 50 ug via INTRAVENOUS
  Administered 2022-08-30 (×2): 100 ug via INTRAVENOUS

## 2022-08-30 MED ORDER — DEXAMETHASONE SODIUM PHOSPHATE 10 MG/ML IJ SOLN
INTRAMUSCULAR | Status: AC
  Filled 2022-08-30: qty 1

## 2022-08-30 MED ORDER — PHENYLEPHRINE 80 MCG/ML (10ML) SYRINGE FOR IV PUSH (FOR BLOOD PRESSURE SUPPORT)
PREFILLED_SYRINGE | INTRAVENOUS | Status: AC
  Filled 2022-08-30: qty 10

## 2022-08-30 MED ORDER — CHLORHEXIDINE GLUCONATE 0.12 % MT SOLN
OROMUCOSAL | Status: AC
  Administered 2022-08-30: 15 mL via OROMUCOSAL
  Filled 2022-08-30: qty 15

## 2022-08-30 MED ORDER — DEXAMETHASONE SODIUM PHOSPHATE 10 MG/ML IJ SOLN
INTRAMUSCULAR | Status: DC | PRN
  Administered 2022-08-30: 5 mg via INTRAVENOUS

## 2022-08-30 MED ORDER — ORAL CARE MOUTH RINSE: 15.0000 mL | Freq: Once | OROMUCOSAL | Status: AC

## 2022-08-30 SURGICAL SUPPLY — 43 items
ALCOHOL 70% 16 OZ (MISCELLANEOUS) ×1 IMPLANT
BAG COUNTER SPONGE SURGICOUNT (BAG) ×1 IMPLANT
BIT DRILL CALIBRATED 4.2 (BIT) IMPLANT
BIT DRILL CANN 16 HIP (BIT) IMPLANT
BIT DRILL CANN STP 6/9 HIP (BIT) IMPLANT
BNDG COHESIVE 6X5 TAN STRL LF (GAUZE/BANDAGES/DRESSINGS) ×2 IMPLANT
CANISTER SUCT 3000ML PPV (MISCELLANEOUS) ×1 IMPLANT
COVER PERINEAL POST (MISCELLANEOUS) ×1 IMPLANT
COVER SURGICAL LIGHT HANDLE (MISCELLANEOUS) ×1 IMPLANT
DRAPE C-ARM 42X72 X-RAY (DRAPES) ×1 IMPLANT
DRAPE HALF SHEET 40X57 (DRAPES) IMPLANT
DRAPE INCISE IOBAN 66X45 STRL (DRAPES) ×1 IMPLANT
DRAPE STERI IOBAN 125X83 (DRAPES) ×1 IMPLANT
DRILL BIT CALIBRATED 4.2 (BIT) ×1
DRSG ADAPTIC 3X8 NADH LF (GAUZE/BANDAGES/DRESSINGS) ×1 IMPLANT
DURAPREP 26ML APPLICATOR (WOUND CARE) ×1 IMPLANT
ELECT CAUTERY BLADE 6.4 (BLADE) ×1 IMPLANT
ELECT REM PT RETURN 9FT ADLT (ELECTROSURGICAL) ×1
ELECTRODE REM PT RTRN 9FT ADLT (ELECTROSURGICAL) ×1 IMPLANT
GAUZE SPONGE 4X4 12PLY STRL (GAUZE/BANDAGES/DRESSINGS) IMPLANT
GAUZE SPONGE 4X4 12PLY STRL LF (GAUZE/BANDAGES/DRESSINGS) ×1 IMPLANT
GLOVE BIO SURGEON STRL SZ7.5 (GLOVE) ×2 IMPLANT
GLOVE BIOGEL PI IND STRL 8 (GLOVE) ×2 IMPLANT
GOWN STRL REUS W/ TWL LRG LVL3 (GOWN DISPOSABLE) ×1 IMPLANT
GOWN STRL REUS W/ TWL XL LVL3 (GOWN DISPOSABLE) ×2 IMPLANT
GOWN STRL REUS W/TWL LRG LVL3 (GOWN DISPOSABLE) ×1
GOWN STRL REUS W/TWL XL LVL3 (GOWN DISPOSABLE) ×2
GUIDEWIRE 3.2X400 (WIRE) IMPLANT
KIT BASIN OR (CUSTOM PROCEDURE TRAY) ×1 IMPLANT
KIT TURNOVER KIT B (KITS) ×1 IMPLANT
NAIL IM TFNA 9X235 130D RT (Nail) IMPLANT
NS IRRIG 1000ML POUR BTL (IV SOLUTION) ×1 IMPLANT
PACK GENERAL/GYN (CUSTOM PROCEDURE TRAY) ×1 IMPLANT
PAD ARMBOARD 7.5X6 YLW CONV (MISCELLANEOUS) ×2 IMPLANT
SCREW FENES TFNA 95 (Screw) IMPLANT
SCREW LOCK IM TI 5X30 (Screw) IMPLANT
STAPLER VISISTAT 35W (STAPLE) ×1 IMPLANT
SUT MON AB 2-0 CT1 36 (SUTURE) ×1 IMPLANT
SUT VIC AB 2-0 CT1 27 (SUTURE) ×2
SUT VIC AB 2-0 CT1 TAPERPNT 27 (SUTURE) IMPLANT
TOWEL GREEN STERILE (TOWEL DISPOSABLE) ×1 IMPLANT
TOWEL GREEN STERILE FF (TOWEL DISPOSABLE) ×1 IMPLANT
WATER STERILE IRR 1000ML POUR (IV SOLUTION) ×1 IMPLANT

## 2022-08-30 NOTE — Progress Notes (Signed)
[facility] on floor to transport patient to [facility], called Heather, RN on [place] to update on pain medications that were given, all belongings packed and ready to go with patient.

## 2022-08-30 NOTE — Anesthesia Preprocedure Evaluation (Addendum)
Anesthesia Evaluation  Patient identified by MRN, date of birth, ID band Patient awake    Reviewed: Allergy & Precautions, NPO status , Patient's Chart, lab work & pertinent test results  History of Anesthesia Complications Negative for: history of anesthetic complications  Airway Mallampati: II  TM Distance: >3 FB Neck ROM: Full    Dental  (+) Edentulous Upper, Edentulous Lower, Dental Advisory Given   Pulmonary Current Smoker and Patient abstained from smoking.   breath sounds clear to auscultation       Cardiovascular (-) hypertension(-) angina + CAD  (-) Past MI  Rhythm:Regular  INTERPRETATION  NORMAL LEFT VENTRICULAR SYSTOLIC FUNCTION  NORMAL RIGHT VENTRICULAR SYSTOLIC FUNCTION  MILD VALVULAR REGURGITATION (See above)  NO VALVULAR STENOSIS  MILD MITRAL VALVE PROLAPSE  SIGNIFICANT IMPROVEMENT IN LV SYSTOLIC FUNCTION WHEN COMPARED TO ECHO REPORT FROM  07/03/2021      Prox RCA to Mid RCA lesion is 25% stenosed.   Ost Cx to Prox Cx lesion is 25% stenosed.   LV end diastolic pressure is moderately elevated.   There is no aortic valve stenosis.   Conclusion 1. Mild non-obstructive coronary artery disease.  2. Moderately elevated LVEDP of 22 mmHg, with systemic hypotension.   Recommendations: 1. Medical management of Stress cardiomyopathy (Takotsubo). Initiation of beta blocker and ACE/ARB as BP allows.  2. Aspirin 81 mg indefinitely 3. Aggressive secondary prevention    Neuro/Psych  PSYCHIATRIC DISORDERS Anxiety Depression    negative neurological ROS     GI/Hepatic ,GERD  ,,(+)     substance abuse  alcohol use  Endo/Other  negative endocrine ROS    Renal/GU negative Renal ROS     Musculoskeletal Right Femoral fracture   Abdominal   Peds  Hematology  (+) Blood dyscrasia, anemia   Anesthesia Other Findings   Reproductive/Obstetrics                             Anesthesia  Physical Anesthesia Plan  ASA: 3  Anesthesia Plan: General   Post-op Pain Management: Ofirmev IV (intra-op)*   Induction: Intravenous  PONV Risk Score and Plan: 2 and Ondansetron and Dexamethasone  Airway Management Planned: Oral ETT  Additional Equipment: None  Intra-op Plan:   Post-operative Plan: Extubation in OR  Informed Consent: I have reviewed the patients History and Physical, chart, labs and discussed the procedure including the risks, benefits and alternatives for the proposed anesthesia with the patient or authorized representative who has indicated his/her understanding and acceptance.     Dental advisory given  Plan Discussed with: CRNA  Anesthesia Plan Comments:        Anesthesia Quick Evaluation

## 2022-08-30 NOTE — Anesthesia Procedure Notes (Signed)
Procedure Name: Intubation Date/Time: 08/30/2022 11:42 AM  Performed by: Oleta Mouse, MDPre-anesthesia Checklist: Patient identified, Emergency Drugs available, Suction available and Patient being monitored Patient Re-evaluated:Patient Re-evaluated prior to induction Oxygen Delivery Method: Circle system utilized Preoxygenation: Pre-oxygenation with 100% oxygen Induction Type: IV induction Ventilation: Mask ventilation without difficulty Laryngoscope Size: Mac and 3 Grade View: Grade I Tube type: Oral Tube size: 7.0 mm Number of attempts: 1 Airway Equipment and Method: Stylet and Oral airway Placement Confirmation: ETT inserted through vocal cords under direct vision, positive ETCO2 and breath sounds checked- equal and bilateral Secured at: 19 cm Tube secured with: Tape Dental Injury: Teeth and Oropharynx as per pre-operative assessment

## 2022-08-30 NOTE — Anesthesia Postprocedure Evaluation (Signed)
Anesthesia Post Note  Patient: Carla Martinez  Procedure(s) Performed: INTRAMEDULLARY (IM) NAIL INTERTROCHANTERIC (Right)     Patient location during evaluation: PACU Anesthesia Type: General Level of consciousness: awake and patient cooperative Pain management: pain level controlled Vital Signs Assessment: post-procedure vital signs reviewed and stable Respiratory status: spontaneous breathing, nonlabored ventilation, respiratory function stable and patient connected to nasal cannula oxygen Cardiovascular status: blood pressure returned to baseline and stable Postop Assessment: no apparent nausea or vomiting Anesthetic complications: no   No notable events documented.  Last Vitals:  Vitals:   08/30/22 1423 08/30/22 1424  BP:  123/71  Pulse:  (!) 106  Resp:    Temp:  36.5 C  SpO2: (!) 86% 92%    Last Pain:  Vitals:   08/30/22 1424  TempSrc: Oral  PainSc:                  [name]

## 2022-08-30 NOTE — Brief Op Note (Signed)
08/30/2022  12:50 PM  PATIENT:  Carla Martinez  61 y.o. female  PRE-OPERATIVE DIAGNOSIS:  Right Femoral fracture  POST-OPERATIVE DIAGNOSIS:  right femoral fraction  PROCEDURE:  Procedure(s): INTRAMEDULLARY (IM) NAIL INTERTROCHANTERIC (Right)  SURGEON:  Surgeon(s) and Role:    * Nicholes Stairs, MD - Primary  PHYSICIAN ASSISTANT: Jonelle Sidle, PA-C  ANESTHESIA:   general  EBL:  50 mL   BLOOD ADMINISTERED:none  DRAINS: none   LOCAL MEDICATIONS USED:  NONE  SPECIMEN:  No Specimen  DISPOSITION OF SPECIMEN:  N/A  COUNTS:  YES  TOURNIQUET:  * No tourniquets in log *  DICTATION: .Note written in EPIC  PLAN OF CARE: Admit to inpatient   PATIENT DISPOSITION:  PACU - hemodynamically stable.   Delay start of Pharmacological VTE agent (>24hrs) due to surgical blood loss or risk of bleeding: not applicable

## 2022-08-30 NOTE — Plan of Care (Signed)
  Problem: Coping: Goal: Level of anxiety will decrease Outcome: Progressing   Problem: Pain Managment: Goal: General experience of comfort will improve Outcome: Progressing   Problem: Safety: Goal: Ability to remain free from injury will improve Outcome: Progressing   

## 2022-08-30 NOTE — H&P (Signed)
H&P update  The surgical history has been reviewed and remains accurate without interval change.  The patient was re-examined and patient's physiologic condition has not changed significantly in the last 30 days. The condition still exists that makes this procedure necessary. The treatment plan remains the same, without new options for care.  No new pharmacological allergies or types of therapy has been initiated that would change the plan or the appropriateness of the plan.  The patient and/or family understand the potential benefits and risks.  Sonni Barse P. Stann Mainland, MD 08/30/2022 10:44 AM
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
## 2022-08-30 NOTE — Transfer of Care (Signed)
Immediate Anesthesia Transfer of Care Note  Patient: Carla Martinez  Procedure(s) Performed: INTRAMEDULLARY (IM) NAIL INTERTROCHANTERIC (Right)  Patient Location: PACU  Anesthesia Type:General  Level of Consciousness: drowsy and patient cooperative  Airway & Oxygen Therapy: Patient Spontanous Breathing and Patient connected to face mask oxygen  Post-op Assessment: Report given to RN, Post -op Vital signs reviewed and stable, and Patient moving all extremities X 4  Post vital signs: Reviewed and stable  Last Vitals:  Vitals Value Taken Time  BP 98/54 08/30/22 1250  Temp    Pulse 91 08/30/22 1254  Resp 11 08/30/22 1254  SpO2 100 % 08/30/22 1254  Vitals shown include unvalidated device data.  Last Pain:  Vitals:   08/30/22 0919  TempSrc: Oral  PainSc: 0-No pain      Patients Stated Pain Goal: 4 (32/02/33 4356)  Complications: No notable events documented.

## 2022-08-30 NOTE — Progress Notes (Signed)
Informed by patient she had pulled off her post op dressing. [facility] on call PA notified. Stated to place xeroform, gauze and medipore tape over site. Dressing replaced with above. Will continue to monitor.

## 2022-08-30 NOTE — Op Note (Signed)
Date of Surgery: 08/30/2022  INDICATIONS: Ms. Eastep is a 61 y.o.-year-old female who sustained a right hip fracture. The risks and benefits of the procedure discussed with the patient prior to the procedure and all questions were answered; consent was obtained.  PREOPERATIVE DIAGNOSIS: right hip fracture   POSTOPERATIVE DIAGNOSIS: Same   PROCEDURE: Treatment of intertrochanteric, pertrochanteric, subtrochanteric fracture with intramedullary implant. CPT [phone]   SURGEON: Dannielle Karvonen. Stann Mainland, M.D.   Assistant:  Jonelle Sidle, PA-C  Assistant attestation:  PA Thereasa Solo present for the entire procedure.  ANESTHESIA: general   IV FLUIDS AND URINE: See anesthesia record   ESTIMATED BLOOD LOSS: 50 cc  IMPLANTS:   Synthes TFN a 9 mm x 265 mm nail 95 mm compression screw 30 mm distal interlock  DRAINS: None.   COMPLICATIONS: None.   DESCRIPTION OF PROCEDURE: The patient was brought to the operating room and placed supine on the operating table. The patient's leg had been signed prior to the procedure. The patient had the anesthesia placed by the anesthesiologist. The prep verification and incision time-outs were performed to confirm that this was the correct patient, site, side and location. The patient had an SCD on the opposite lower extremity. The patient did receive antibiotics prior to the incision and was re-dosed during the procedure as needed at indicated intervals. The patient was positioned on the fracture table with the table in traction and internal rotation to reduce the hip. The well leg was placed in a scissor position and all bony prominences were well-padded. The patient had the lower extremity prepped and draped in the standard surgical fashion. The incision was made 4 finger breadths superior to the greater trochanter. A guide pin was inserted into the tip of the greater trochanter under fluoroscopic guidance. An opening reamer was used to gain access to the femoral  canal. The nail length was measured and inserted down the femoral canal to its proper depth. The appropriate version of insertion for the lag screw was found under fluoroscopy. A pin was inserted up the femoral neck through the jig. The length of the lag screw was then measured. The lag screw was inserted as near to center-center in the head as possible. The leg was taken out of traction, then the compression screw was used to compress across the fracture. Compression was visualized on serial xrays.   We next turned our attention to the distal interlocking screw.  This was placed through the drill guide of the nail inserter.  A small incision was made overlying the lateral thigh at the screw site, and a tonsil was used to disect down to bone.  A drill pass was made through the jig and across the nail through both cortices.  This was measured, and the appropriate screw was placed under hand power and found to have good bite.    The wound was copiously irrigated with saline and the subcutaneous layer closed with 2.0 vicryl and the skin was reapproximated with staples. The wounds were cleaned and dried a final time and a sterile dressing was placed. The hip was taken through a range of motion at the end of the case under fluoroscopic imaging to visualize the approach-withdraw phenomenon and confirm implant length in the head. The patient was then awakened from anesthesia and taken to the recovery room in stable condition. All counts were correct at the end of the case.   POSTOPERATIVE PLAN: The patient will be weight bearing as tolerated and will return in  2 weeks for staple removal and the patient will receive DVT prophylaxis based on other medications, activity level, and risk ratio of bleeding to thrombosis.     Geralynn Rile, MD [facility] [phone] 12:50 PM

## 2022-08-31 ENCOUNTER — Other Ambulatory Visit: Payer: Self-pay

## 2022-08-31 LAB — BASIC METABOLIC PANEL
Anion gap: 11 (ref 5–15)
BUN: 9 mg/dL (ref 6–20)
CO2: 22 mmol/L (ref 22–32)
Calcium: 8.8 mg/dL — ABNORMAL LOW (ref 8.9–10.3)
Chloride: 106 mmol/L (ref 98–111)
Creatinine, Ser: 0.98 mg/dL (ref 0.44–1.00)
GFR, Estimated: >60 mL/min (ref >60)
Glucose, Bld: 121 mg/dL — ABNORMAL HIGH (ref 70–99)
Potassium: 4 mmol/L (ref 3.5–5.1)
Sodium: 139 mmol/L (ref 135–145)

## 2022-08-31 LAB — CBC
HCT: 25.7 % — ABNORMAL LOW (ref 36.0–46.0)
Hemoglobin: 8.4 g/dL — ABNORMAL LOW (ref 12.0–15.0)
MCH: 28.9 pg (ref 26.0–34.0)
MCHC: 32.7 g/dL (ref 30.0–36.0)
MCV: 88.3 fL (ref 80.0–100.0)
Platelets: 264 10*3/uL (ref 150–400)
RBC: 2.91 MIL/uL — ABNORMAL LOW (ref 3.87–5.11)
RDW: 14.9 % (ref 11.5–15.5)
WBC: 9.6 10*3/uL (ref 4.0–10.5)
nRBC: 0 % (ref 0.0–0.2)

## 2022-08-31 MED ORDER — LORAZEPAM 2 MG/ML IJ SOLN
1.0000 mg | INTRAMUSCULAR | Status: DC | PRN
  Administered 2022-08-31: 4 mg via INTRAVENOUS
  Filled 2022-08-31: qty 2

## 2022-08-31 MED ORDER — FOLIC ACID 1 MG PO TABS
1.0000 mg | ORAL_TABLET | Freq: Every day | ORAL | Status: DC
  Administered 2022-08-31 – 2022-09-02 (×3): 1 mg via ORAL
  Filled 2022-08-31 (×3): qty 1

## 2022-08-31 MED ORDER — THIAMINE MONONITRATE 100 MG PO TABS
100.0000 mg | ORAL_TABLET | Freq: Every day | ORAL | Status: DC
  Administered 2022-08-31 – 2022-09-02 (×3): 100 mg via ORAL
  Filled 2022-08-31 (×3): qty 1

## 2022-08-31 MED ORDER — ADULT MULTIVITAMIN W/MINERALS CH
1.0000 | ORAL_TABLET | Freq: Every day | ORAL | Status: DC
  Administered 2022-08-31 – 2022-09-02 (×3): 1 via ORAL
  Filled 2022-08-31 (×3): qty 1

## 2022-08-31 MED ORDER — LORAZEPAM 1 MG PO TABS
1.0000 mg | ORAL_TABLET | ORAL | Status: DC | PRN
  Administered 2022-08-31: 1 mg via ORAL
  Filled 2022-08-31: qty 1

## 2022-08-31 MED ORDER — THIAMINE HCL 100 MG/ML IJ SOLN
100.0000 mg | Freq: Every day | INTRAMUSCULAR | Status: DC
  Filled 2022-08-31: qty 2
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
## 2022-08-31 NOTE — Progress Notes (Signed)
Patient was found by tech attempting to get out of bed, alarm sounding, Patient stated that she was looking for the little boy she just saw in her room. Patient nauseated and notably has tremors. She is agitated towards staff. CIWA score of 16 obtained, medicated per protocol ordered. Dr. Darrick Meigs notified of score.

## 2022-08-31 NOTE — Evaluation (Addendum)
Physical Therapy Evaluation Patient Details Name: Carla Martinez MRN: [id number] DOB: 02-10-1961 Today's Date: 08/31/2022  History of Present Illness  The pt is a 61 yo female presenting 11/9 with R hip pain after fall at home. Found to have R intertrochanteric proximal femur fracture now s/p IMN placement 11/11 and R 6th rib fx. PMH includes: alcohol abuse, anxiety, CHF, depression, afib, and polysubstance abuse.    Clinical Impression  Pt in bed upon arrival of PT, agreeable to evaluation at this time. Prior to admission, the pt reports she was mobilizing in the home using crutches, falling at least 1x/day and needing some assist from the aide she has hired to assist her son and mother (pt unable to clarify what kind or how much assist). She reports she is typically independent and the caretaker of her adult son and 60 yo mother. However, no family was present to confirm information and the pt gave varying information through session, demos poor memory of events of earlier in day or within session, and at one point asked if she was still in the hospital. I attempted to re-orient and re-direct the pt to ascertain accurate information with little success. The pt is very impulsive with all movements, and demos significant deficits in safety awareness, problem solving, and awareness of deficits. She required min-modA to complete all bed mobility and OOB transfers at this time and was limited to short bout of ambulation in the room due to pain, impulsivity, and safety.   Given the pt's cognitive deficits and need for assist, I would recommend SNF rehab for increased supervision and assist, however the pt is adamant about returning home as soon as possible due to lack of insurance. Therefore, recommend max HHPT but feel the pt is not yet safe to return home given significant confusion and need for constant cues for safety.     Recommendations for follow up therapy are one component of a  multi-disciplinary discharge planning process, led by the attending physician.  Recommendations may be updated based on patient status, additional functional criteria and insurance authorization.  Follow Up Recommendations Home health PT (if family, daughter can provide assist)      Assistance Recommended at Discharge Frequent or constant Supervision/Assistance  Patient can return home with the following  A little help with walking and/or transfers;A little help with bathing/dressing/bathroom;Assistance with cooking/housework;Direct supervision/assist for medications management;Assistance with feeding;Direct supervision/assist for financial management;Assist for transportation;Help with stairs or ramp for entrance    Equipment Recommendations Rolling walker (2 wheels);BSC/3in1  Recommendations for Other Services  OT consult    Functional Status Assessment Patient has had a recent decline in their functional status and demonstrates the ability to make significant improvements in function in a reasonable and predictable amount of time.     Precautions / Restrictions Precautions Precautions: Fall Precaution Comments: impulsive with poor safety awareness Restrictions Weight Bearing Restrictions: Yes RLE Weight Bearing: Weight bearing as tolerated Other Position/Activity Restrictions: per op note      Mobility  Bed Mobility Overal bed mobility: Needs Assistance Bed Mobility: Supine to Sit     Supine to sit: Min assist     General bed mobility comments: minA to manage LE    Transfers Overall transfer level: Needs assistance Equipment used: Rolling walker (2 wheels) Transfers: Sit to/from Stand Sit to Stand: Min assist, Mod assist           General transfer comment: minA initially, modA at times with transition to sitting due to impulsivity  and pt sitting before she has reached chair/toilet.    Ambulation/Gait Ambulation/Gait assistance: Min assist, Mod assist Gait  Distance (Feet): 15 Feet (+ 10 ft + 5 ft) Assistive device: Rolling walker (2 wheels)   Gait velocity: decreased     General Gait Details: hop-to pattern initially with pt maintaining RLE NWB due to pain, able to attempt some touching of RLE to ground with encouragement. dependent on BUE support and min-modA to manage RW movement     Balance Overall balance assessment: Needs assistance Sitting-balance support: No upper extremity supported, Feet supported Sitting balance-Leahy Scale: Good     Standing balance support: Bilateral upper extremity supported, During functional activity Standing balance-Leahy Scale: Poor                               Pertinent Vitals/Pain Pain Assessment Pain Assessment: Faces Faces Pain Scale: Hurts even more Pain Location: R hip/thigh Pain Descriptors / Indicators: Aching, Discomfort, Grimacing, Moaning, Sore Pain Intervention(s): Limited activity within patient's tolerance, Monitored during session, Repositioned, Patient requesting pain meds-RN notified, Ice applied    Home Living Family/patient expects to be discharged to:: Private residence Living Arrangements: Children;Parent Available Help at Discharge: Family;Personal care attendant;Available PRN/intermittently Type of Home: Apartment Home Access: Stairs to enter Entrance Stairs-Rails: None Entrance Stairs-Number of Steps: 2 Alternate Level Stairs-Number of Steps: flight Home Layout: Multi-level;Able to live on main level with bedroom/bathroom Home Equipment: Rolling Walker (2 wheels);Crutches;Wheelchair - manual (knee scooter) Additional Comments: pt states she does not go upstairs to where her mother lives, but that she falls when she tries to bring her mother a drink (so might be going up and down stairs?). has aide 4 hours in AM and 4 hours in PM to assist with son, but that the aide also helps her to get out of the tub. pt very inconsistent with responses    Prior Function  Prior Level of Function : Needs assist;History of Falls (last six months);Patient poor historian/Family not available             Mobility Comments: pt reports falling at least 1x/day when attempting to walk since accident. states she typically is independent and assists her mother and son at the house. ADLs Comments: pt reports typically independent, has been relying on aide to get out of bath since accident     Hand Dominance   Dominant Hand: Right    Extremity/Trunk Assessment   Upper Extremity Assessment Upper Extremity Assessment: Generalized weakness    Lower Extremity Assessment Lower Extremity Assessment: Generalized weakness;RLE deficits/detail RLE Deficits / Details: limited by pain, pt reports sensation intact RLE: Unable to fully assess due to pain RLE Sensation: WNL    Cervical / Trunk Assessment Cervical / Trunk Assessment: Normal  Communication   Communication: No difficulties  Cognition Arousal/Alertness: Awake/alert Behavior During Therapy: Impulsive, Restless, Anxious Overall Cognitive Status: No family/caregiver present to determine baseline cognitive functioning Area of Impairment: Orientation, Attention, Memory, Following commands, Safety/judgement, Awareness, Problem solving                 Orientation Level: Disoriented to, Place Current Attention Level: Focused Memory: Decreased recall of precautions, Decreased short-term memory Following Commands: Follows one step commands inconsistently, Follows one step commands with increased time Safety/Judgement: Decreased awareness of safety, Decreased awareness of deficits Awareness: Intellectual Problem Solving: Slow processing, Decreased initiation, Difficulty sequencing, Requires verbal cues General Comments: pt with significantly inconsistent reports through session, asking  therapist at one point if she is still in hospital or if she is discharged, then forgot she had asked this question. pt  unsure if she has eaten or had pain medicine throughout day (per RN, had medicine 2 hours prior to session). Pt gave inconsistent answers to all home set up questions, is easily distracted, very impulsive with any mobility, and has poor insight to deficits and safety.        General Comments General comments (skin integrity, edema, etc.): VSS on RA, pt soiled in bed upon arrival, had not alerted staff        Assessment/Plan    PT Assessment Patient needs continued PT services  PT Problem List Decreased strength;Decreased range of motion;Decreased activity tolerance;Decreased balance;Decreased mobility;Decreased cognition;Decreased safety awareness       PT Treatment Interventions Gait training;DME instruction;Stair training;Functional mobility training;Therapeutic activities;Therapeutic exercise;Balance training;Patient/family education;Cognitive remediation    PT Goals (Current goals can be found in the Care Plan section)  Acute Rehab PT Goals Patient Stated Goal: return home PT Goal Formulation: With patient Time For Goal Achievement: 09/14/22 Potential to Achieve Goals: Good    Frequency Min 3X/week        AM-PAC PT "6 Clicks" Mobility  Outcome Measure Help needed turning from your back to your side while in a flat bed without using bedrails?: A Little Help needed moving from lying on your back to sitting on the side of a flat bed without using bedrails?: A Little Help needed moving to and from a bed to a chair (including a wheelchair)?: A Little Help needed standing up from a chair using your arms (e.g., wheelchair or bedside chair)?: A Little Help needed to walk in hospital room?: A Lot Help needed climbing 3-5 steps with a railing? : A Lot 6 Click Score: 16    End of Session Equipment Utilized During Treatment: Gait belt Activity Tolerance: Patient tolerated treatment well;Patient limited by pain Patient left: in chair;with call bell/phone within reach;with chair  alarm set Nurse Communication: Mobility status PT Visit Diagnosis: Other abnormalities of gait and mobility (R26.89);Muscle weakness (generalized) (M62.81);History of falling (Z91.81);Repeated falls (R29.6);Pain Pain - Right/Left: Right Pain - part of body: Hip    Time: 6213-0865 PT Time Calculation (min) (ACUTE ONLY): 49 min   Charges:   PT Evaluation $PT Eval Moderate Complexity: 1 Mod PT Treatments $Gait Training: 8-22 mins $Therapeutic Activity: 8-22 mins        West Carbo, PT, DPT   Acute Rehabilitation Department  Sandra Cockayne 08/31/2022, 3:52 PM

## 2022-09-01 ENCOUNTER — Encounter (HOSPITAL_COMMUNITY): Payer: Self-pay | Admitting: Orthopedic Surgery

## 2022-09-01 DIAGNOSIS — S72141A Displaced intertrochanteric fracture of right femur, initial encounter for closed fracture: Secondary | ICD-10-CM

## 2022-09-01 DIAGNOSIS — S2231XA Fracture of one rib, right side, initial encounter for closed fracture: Secondary | ICD-10-CM

## 2022-09-01 LAB — CBC
HCT: 21.7 % — ABNORMAL LOW (ref 36.0–46.0)
Hemoglobin: 7.3 g/dL — ABNORMAL LOW (ref 12.0–15.0)
MCH: 28.9 pg (ref 26.0–34.0)
MCHC: 33.6 g/dL (ref 30.0–36.0)
MCV: 85.8 fL (ref 80.0–100.0)
Platelets: 295 10*3/uL (ref 150–400)
RBC: 2.53 MIL/uL — ABNORMAL LOW (ref 3.87–5.11)
RDW: 15.2 % (ref 11.5–15.5)
WBC: 8.4 10*3/uL (ref 4.0–10.5)
nRBC: 0 % (ref 0.0–0.2)

## 2022-09-01 MED ORDER — METOPROLOL TARTRATE 5 MG/5ML IV SOLN: 5.0000 mg | Freq: Four times a day (QID) | INTRAVENOUS | Status: DC | PRN

## 2022-09-01 MED ORDER — BISACODYL 10 MG RE SUPP
10.0000 mg | Freq: Every day | RECTAL | Status: DC
  Administered 2022-09-02: 10 mg via RECTAL
  Filled 2022-09-01: qty 1

## 2022-09-01 MED ORDER — METOPROLOL SUCCINATE ER 25 MG PO TB24
12.5000 mg | ORAL_TABLET | Freq: Every day | ORAL | Status: DC
  Administered 2022-09-01 – 2022-09-02 (×2): 12.5 mg via ORAL
  Filled 2022-09-01 (×2): qty 1

## 2022-09-01 MED ORDER — POLYETHYLENE GLYCOL 3350 17 G PO PACK
17.0000 g | PACK | Freq: Two times a day (BID) | ORAL | Status: DC
  Administered 2022-09-01 – 2022-09-02 (×3): 17 g via ORAL
  Filled 2022-09-01 (×3): qty 1

## 2022-09-01 MED ORDER — SENNOSIDES-DOCUSATE SODIUM 8.6-50 MG PO TABS
1.0000 | ORAL_TABLET | Freq: Two times a day (BID) | ORAL | Status: AC
  Administered 2022-09-01 (×2): 1 via ORAL
  Filled 2022-09-01: qty 1

## 2022-09-01 NOTE — Progress Notes (Signed)
Physical Therapy Treatment Patient Details Name: Carla Martinez MRN: [id number] DOB: 09-Sep-1961 Today's Date: 09/01/2022   History of Present Illness The pt is a 61 yo female presenting 11/9 with R hip pain after fall at home. Found to have R intertrochanteric proximal femur fracture now s/p ORIF of R femur fx 11/11, and R 6th rib fx. PMH includes: alcohol abuse, anxiety, CHF, depression, afib, and polysubstance abuse.    PT Comments    Pt received in supine, agreeable to limited supine exercise instruction, but deferring EOB/OOB mobility due to increased R hip pain and fatigue after recent bed bath with assist from RN. Pt following commands for LE exercises with multimodal cues and reports her mother who lives with her also has balance issues and history of falls and will not be able to assist her. Need to clarify level of support available at home upon DC as pt currently would need constant supervision/assist and needing physical assist to stand/transfer, pt unable to tolerate gait/OOB this session due to pain. Pt premedicated ~90 mins prior to session. Pt continues to benefit from PT services to progress toward functional mobility goals. May need to consider post-acute therapies if pt still unable to transfer/ambulate next session, will continue to monitor progression.   Recommendations for follow up therapy are one component of a multi-disciplinary discharge planning process, led by the attending physician.  Recommendations may be updated based on patient status, additional functional criteria and insurance authorization.  Follow Up Recommendations  Home health PT (if family, daughter can provide physical lifting assist for her to transfer)     Assistance Recommended at Discharge Frequent or constant Supervision/Assistance  Patient can return home with the following A little help with walking and/or transfers;A little help with bathing/dressing/bathroom;Assistance with  cooking/housework;Direct supervision/assist for medications management;Assistance with feeding;Direct supervision/assist for financial management;Assist for transportation;Help with stairs or ramp for entrance   Equipment Recommendations  Rolling walker (2 wheels);BSC/3in1;Other (comment) (may need to consider wheelchair if still unable to progress gait on Tuesday's session.)    Recommendations for Other Services       Precautions / Restrictions Precautions Precautions: Fall Precaution Comments: impulsive with poor safety awareness Restrictions Weight Bearing Restrictions: Yes RLE Weight Bearing: Weight bearing as tolerated     Mobility  Bed Mobility Overal bed mobility: Needs Assistance Bed Mobility: Supine to Sit, Sit to Supine, Rolling Rolling: Mod assist   Supine to sit: Mod assist, +2 for safety/equipment Sit to supine: Mod assist   General bed mobility comments: assist for L LE and to raise trunk, pt cued to pull up on B side rails; once in long sitting, pt very pain limited and refusing EOB/OOB mobility; pt also recently had bath with nursing assist so pain was already elevated;    Transfers                   General transfer comment: pt defers due to pain after supine LE exercises and long sitting    Ambulation/Gait                   Stairs             Wheelchair Mobility    Modified Rankin (Stroke Patients Only)       Balance Overall balance assessment: Needs assistance Sitting-balance support: Single extremity supported Sitting balance-Leahy Scale: Poor Sitting balance - Comments: possibly more due to pain than actual poor balance but impulsive to return to supine, pt able to self-support with  single UE only briefly       Standing balance comment: pt defers                            Cognition Arousal/Alertness: Awake/alert Behavior During Therapy: Anxious, Impulsive Overall Cognitive Status: No family/caregiver  present to determine baseline cognitive functioning Area of Impairment: Orientation, Attention, Memory, Following commands, Safety/judgement, Awareness, Problem solving                 Orientation Level: Disoriented to, Situation Current Attention Level: Focused Memory: Decreased short-term memory Following Commands: Follows one step commands with increased time, Follows one step commands inconsistently Safety/Judgement: Decreased awareness of safety, Decreased awareness of deficits Awareness: Intellectual Problem Solving: Slow processing, Decreased initiation, Difficulty sequencing, Requires verbal cues, Requires tactile cues General Comments: Pt needs frequent redirection to task; tangential, confused but slightly more alert and aware in PM session; pt self-limting due to pain, attempted ~345pm but pt defers and then again ~6pm and still very pain limited.        Exercises Other Exercises Other Exercises: supine RLE AAROM and LLE AROM: hip abduction/ADduction, heel slides (AA on R for improved ROM, pt able to partial AROM unassisted), SLR (x5 only), ankle pumps, quad sets, glute sets x10 reps ea Other Exercises: supine to long sitting using BUE to pull up on side rails x2 reps, pt defers more due to R hip pain Other Exercises: bridging her hips x2 reps in supine (donning briefs and for LLE strengthening)    General Comments General comments (skin integrity, edema, etc.): Pt states her mother who lives with her also has balance issues and they take care of her son with down syndrome. HR to ~110 bpm with supine LE exercises      Pertinent Vitals/Pain Pain Assessment Pain Assessment: Faces Faces Pain Scale: Hurts even more Pain Location: R hip/thigh, after premedication Pain Descriptors / Indicators: Aching, Discomfort, Grimacing, Moaning, Sore, Guarding Pain Intervention(s): Monitored during session, Limited activity within patient's tolerance, Premedicated before session,  Repositioned, Ice applied     PT Goals (current goals can now be found in the care plan section) Acute Rehab PT Goals Patient Stated Goal: less pain, to go home PT Goal Formulation: With patient Time For Goal Achievement: 09/14/22 Progress towards PT goals: Progressing toward goals (pain limiting participation)    Frequency    Min 3X/week      PT Plan Current plan remains appropriate (pending progress)       AM-PAC PT "6 Clicks" Mobility   Outcome Measure  Help needed turning from your back to your side while in a flat bed without using bedrails?: A Lot Help needed moving from lying on your back to sitting on the side of a flat bed without using bedrails?: A Lot Help needed moving to and from a bed to a chair (including a wheelchair)?: A Lot Help needed standing up from a chair using your arms (e.g., wheelchair or bedside chair)?: A Lot Help needed to walk in hospital room?: Total Help needed climbing 3-5 steps with a railing? : Total 6 Click Score: 10    End of Session   Activity Tolerance: Patient limited by pain Patient left: in bed;with call bell/phone within reach;with bed alarm set;with SCD's reapplied Nurse Communication: Mobility status;Patient requests pain meds;Precautions;Other (comment) (encouraged use of call bell for OOB needs) PT Visit Diagnosis: Other abnormalities of gait and mobility (R26.89);Muscle weakness (generalized) (M62.81);History of falling (Z91.81);Repeated falls (  R29.6);Pain Pain - Right/Left: Right Pain - part of body: Hip     Time: 2320-0941 PT Time Calculation (min) (ACUTE ONLY): 18 min  Charges:  $Therapeutic Exercise: 8-22 mins                     Nakima Fluegge P., PTA Acute Rehabilitation Services Secure Chat Preferred 9a-5:30pm Office: [place] 09/01/2022, 6:35 PM
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
## 2022-09-01 NOTE — Progress Notes (Signed)
Mobility Specialist Progress Note   09/01/22 1801  Mobility  Activity Contraindicated/medical hold    Pt inappropriate for mobility specialist at this time given level of complexity, physical assist, and/or precautions as advised by RN (Pt very confused). Mobility specialist to hold today, will continue to follow for readiness.  Holland Falling Mobility Specialist Acute Rehab Office:  [phone]

## 2022-09-02 DIAGNOSIS — F1090 Alcohol use, unspecified, uncomplicated: Secondary | ICD-10-CM

## 2022-09-02 DIAGNOSIS — K219 Gastro-esophageal reflux disease without esophagitis: Secondary | ICD-10-CM

## 2022-09-02 LAB — CBC
HCT: 23.8 % — ABNORMAL LOW (ref 36.0–46.0)
Hemoglobin: 7.7 g/dL — ABNORMAL LOW (ref 12.0–15.0)
MCH: 28.2 pg (ref 26.0–34.0)
MCHC: 32.4 g/dL (ref 30.0–36.0)
MCV: 87.2 fL (ref 80.0–100.0)
Platelets: 305 10*3/uL (ref 150–400)
RBC: 2.73 MIL/uL — ABNORMAL LOW (ref 3.87–5.11)
RDW: 15.4 % (ref 11.5–15.5)
WBC: 10.1 10*3/uL (ref 4.0–10.5)
nRBC: 0.2 % (ref 0.0–0.2)

## 2022-09-02 MED ORDER — HYDROCODONE-ACETAMINOPHEN 5-325 MG PO TABS: 1.0000 | ORAL_TABLET | Freq: Four times a day (QID) | ORAL | 0 refills | Status: DC | PRN

## 2022-09-02 MED ORDER — MUPIROCIN 2 % EX OINT: 1.0000 | TOPICAL_OINTMENT | Freq: Two times a day (BID) | CUTANEOUS | 0 refills | Status: DC

## 2022-09-02 MED ORDER — VITAMIN B-1 100 MG PO TABS: 100.0000 mg | ORAL_TABLET | Freq: Every day | ORAL | Status: DC

## 2022-09-02 MED ORDER — BISACODYL 10 MG RE SUPP: 10.0000 mg | Freq: Every day | RECTAL | 0 refills | Status: DC | PRN

## 2022-09-02 MED ORDER — POLYETHYLENE GLYCOL 3350 17 G PO PACK: 17.0000 g | PACK | Freq: Every day | ORAL | 0 refills | Status: DC | PRN

## 2022-09-02 MED ORDER — SENNOSIDES-DOCUSATE SODIUM 8.6-50 MG PO TABS: 2.0000 | ORAL_TABLET | Freq: Every evening | ORAL | Status: DC | PRN

## 2022-09-02 MED ORDER — FLEET ENEMA 7-19 GM/118ML RE ENEM: 1.0000 | ENEMA | Freq: Every day | RECTAL | Status: DC | PRN

## 2022-09-02 MED ORDER — ADULT MULTIVITAMIN W/MINERALS CH: 1.0000 | ORAL_TABLET | Freq: Every day | ORAL | Status: DC

## 2022-09-02 MED ORDER — SENNOSIDES-DOCUSATE SODIUM 8.6-50 MG PO TABS
2.0000 | ORAL_TABLET | Freq: Two times a day (BID) | ORAL | Status: DC
  Administered 2022-09-02: 2 via ORAL
  Filled 2022-09-02: qty 2

## 2022-09-02 MED ORDER — BISACODYL 10 MG RE SUPP: 10.0000 mg | Freq: Every day | RECTAL | Status: DC | PRN

## 2022-09-02 MED ORDER — FOLIC ACID 1 MG PO TABS: 1.0000 mg | ORAL_TABLET | Freq: Every day | ORAL | Status: DC

## 2022-09-02 MED ORDER — ASPIRIN 81 MG PO TBEC: 81.0000 mg | DELAYED_RELEASE_TABLET | Freq: Every day | ORAL | Status: DC

## 2022-09-02 NOTE — TOC Progression Note (Signed)
Transition of Care [facility]) - Progression Note    Patient Details  Name: Carla Martinez MRN: [id number] Date of Birth: June 09, 1961  Transition of [facility] CM/SW Contact  Sharin Mons, RN Phone Number: 09/02/2022, 2:08 PM  Clinical Narrative:    Pt agreeable to home health services. Pt without jobless, limited income. Referral made with [facility] for charity home health services, acceptance pending. Pt states already has @ home W/C,RW, scooter, and crutches. Pt requesting shower chair. Order placed and referral made with [employer] for shower chair under charity care. Equipment will be delivered to bedside prior to d/c.  TOC team will continue to monitor and assist with needs...Marland KitchenMarland KitchenMarland Kitchen    Expected Discharge Plan: [place] Barriers to Discharge: Continued Medical Work up  Expected Discharge Plan and Services Expected Discharge Plan: [place] In-house Referral: Clinical Social Work     Living arrangements for the past 2 months: Single Family Home Expected Discharge Date: 09/02/22                         HH Arranged: PT [place]: [facility] Date [place]: 09/02/22 Time [place]: [id number] Representative spoke with at [place]: [place] Determinants of Health ([place]) Interventions    Readmission Risk Interventions     No data to display
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
## 2022-09-04 ENCOUNTER — Other Ambulatory Visit: Payer: Self-pay
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
## 2022-09-07 NOTE — Patient Instructions (Incomplete)
Thank you for attending your appointment today.  -- START Zoloft 25 mg daily for 6 days then INCREASE to 50 mg daily thereafter -- DECREASE amitriptyline to 50 mg nightly for 1 week then STOP -- Use hydroxyzine as needed for episodes of severe anxiety (instead of scheduled throughout the day) -- Continue other medications as prescribed.  Please do not make any changes to medications without first discussing with your provider. If you are experiencing a psychiatric emergency, please call 911 or present to your nearest emergency department. Additional crisis, medication management, and therapy resources are included below.  [facility]  [address], [place], [place] [address] [phone] WALK-IN URGENT CARE 24/7 FOR ANYONE [address], [place], [place]  [phone] Fax: [phone] [email or web address] *Interpreters available *Accepts all insurance and uninsured for Urgent Care needs *Accepts Medicaid and uninsured for outpatient treatment (below)      ONLY FOR [facility]  Below:    Outpatient New Patient Assessment/Therapy Walk-ins:        Monday -Thursday 8am until slots are full.        Every Friday 1pm-4pm  (first come, first served)                   New Patient Psychiatry/Medication Management        Monday-Friday 8am-11am (first come, first served)               For all walk-ins we ask that you arrive by 7:15am, because patients will be seen in the order of arrival.

## 2022-09-08 ENCOUNTER — Ambulatory Visit: Payer: Self-pay | Admitting: Gastroenterology

## 2022-09-08 ENCOUNTER — Ambulatory Visit (INDEPENDENT_AMBULATORY_CARE_PROVIDER_SITE_OTHER): Payer: No Payment, Other | Admitting: Psychiatry

## 2022-09-08 ENCOUNTER — Encounter (HOSPITAL_COMMUNITY): Payer: Self-pay | Admitting: Psychiatry

## 2022-09-08 DIAGNOSIS — G4700 Insomnia, unspecified: Secondary | ICD-10-CM | POA: Diagnosis not present

## 2022-09-08 DIAGNOSIS — F339 Major depressive disorder, recurrent, unspecified: Secondary | ICD-10-CM

## 2022-09-08 DIAGNOSIS — F411 Generalized anxiety disorder: Secondary | ICD-10-CM

## 2022-09-08 DIAGNOSIS — Z8659 Personal history of other mental and behavioral disorders: Secondary | ICD-10-CM | POA: Diagnosis not present

## 2022-09-08 DIAGNOSIS — F1090 Alcohol use, unspecified, uncomplicated: Secondary | ICD-10-CM

## 2022-09-08 MED ORDER — SERTRALINE HCL 50 MG PO TABS: ORAL_TABLET | ORAL | 2 refills | Status: DC

## 2022-09-08 MED ORDER — GABAPENTIN 300 MG PO CAPS: 300.0000 mg | ORAL_CAPSULE | Freq: Three times a day (TID) | ORAL | 2 refills | Status: DC

## 2022-09-08 MED ORDER — HYDROXYZINE HCL 50 MG PO TABS: 50.0000 mg | ORAL_TABLET | Freq: Three times a day (TID) | ORAL | 2 refills | Status: AC | PRN

## 2022-09-08 MED ORDER — QUETIAPINE FUMARATE 300 MG PO TABS: 300.0000 mg | ORAL_TABLET | Freq: Every day | ORAL | 2 refills | Status: DC

## 2022-09-22 ENCOUNTER — Other Ambulatory Visit: Payer: Self-pay

## 2022-09-22 NOTE — Telephone Encounter (Signed)
Last visit 07/28/2022 Next visit No follow up

## 2022-09-23 MED ORDER — PANTOPRAZOLE SODIUM 40 MG PO TBEC: 40.0000 mg | DELAYED_RELEASE_TABLET | Freq: Every day | ORAL | 0 refills | Status: DC

## 2022-10-17 ENCOUNTER — Other Ambulatory Visit (HOSPITAL_COMMUNITY): Payer: Self-pay | Admitting: Physician Assistant

## 2022-10-17 DIAGNOSIS — F330 Major depressive disorder, recurrent, mild: Secondary | ICD-10-CM

## 2022-10-21 ENCOUNTER — Telehealth (HOSPITAL_COMMUNITY): Payer: 59 | Admitting: Psychiatry

## 2022-10-21 DIAGNOSIS — Z91199 Patient's noncompliance with other medical treatment and regimen due to unspecified reason: Secondary | ICD-10-CM

## 2022-10-21 NOTE — Progress Notes (Signed)
Logged into [facility] app for patient's video visit and waited for 10 minutes but patient didn't show up. Tried calling patient on the phone number provided in the chart but was unsuccessful.  Armando Reichert, MD PGY3 Psychiatry Resident  [facility]

## 2022-10-30 ENCOUNTER — Other Ambulatory Visit (HOSPITAL_COMMUNITY): Payer: Self-pay | Admitting: Physician Assistant

## 2022-10-30 DIAGNOSIS — M79671 Pain in right foot: Secondary | ICD-10-CM

## 2022-10-31 ENCOUNTER — Other Ambulatory Visit (HOSPITAL_COMMUNITY): Payer: Self-pay | Admitting: Physician Assistant

## 2022-10-31 DIAGNOSIS — M79671 Pain in right foot: Secondary | ICD-10-CM

## 2022-11-07 ENCOUNTER — Ambulatory Visit (HOSPITAL_COMMUNITY)
Admission: RE | Admit: 2022-11-07 | Discharge: 2022-11-07 | Disposition: A | Discharge status: Home / Self Care | Payer: 59 | Source: Ambulatory Visit | Attending: Physician Assistant | Admitting: Physician Assistant

## 2022-11-07 DIAGNOSIS — M79671 Pain in right foot: Secondary | ICD-10-CM | POA: Diagnosis present

## 2022-11-13 ENCOUNTER — Other Ambulatory Visit: Payer: Self-pay

## 2022-11-14 ENCOUNTER — Telehealth: Payer: Self-pay | Admitting: Nurse Practitioner

## 2022-11-14 ENCOUNTER — Encounter: Payer: Self-pay | Admitting: Nurse Practitioner

## 2022-11-14 ENCOUNTER — Ambulatory Visit (INDEPENDENT_AMBULATORY_CARE_PROVIDER_SITE_OTHER): Payer: 59 | Admitting: Nurse Practitioner

## 2022-11-14 VITALS — BP 108/52 | HR 122 | Ht 63.0 in | Wt 103.0 lb

## 2022-11-14 DIAGNOSIS — R051 Acute cough: Secondary | ICD-10-CM

## 2022-11-14 DIAGNOSIS — J22 Unspecified acute lower respiratory infection: Secondary | ICD-10-CM | POA: Insufficient documentation

## 2022-11-14 DIAGNOSIS — R0602 Shortness of breath: Secondary | ICD-10-CM | POA: Diagnosis not present

## 2022-11-14 MED ORDER — DOXYCYCLINE HYCLATE 100 MG PO TABS: 100.0000 mg | ORAL_TABLET | Freq: Two times a day (BID) | ORAL | 0 refills | Status: AC

## 2022-11-14 MED ORDER — PREDNISONE 20 MG PO TABS: ORAL_TABLET | ORAL | 0 refills | Status: AC

## 2022-11-14 MED ORDER — GUAIFENESIN-CODEINE 100-10 MG/5ML PO SOLN: 5.0000 mL | Freq: Three times a day (TID) | ORAL | 0 refills | Status: DC | PRN

## 2022-11-14 NOTE — Assessment & Plan Note (Signed)
Was prescribed Tessalon Per[facility] from [facility] without great relief.  Will be used codeine-guaifenesin 5 mL 3 times daily as needed cough.  Sedation precautions reviewed with patient

## 2022-11-14 NOTE — Progress Notes (Signed)
Acute Office Visit  Subjective:     Patient ID: Carla Martinez, female    DOB: 04/23/1961, 62 y.o.   MRN: [id number]  Chief Complaint  Patient presents with   Cough    X2w, dry cough, wheezing, denies fever/chills   Nasal Congestion    Headache, some left ear fullness; taking mucinex     Patient is in today for Sick symptoms  Symptoms started approx 2 weeks ago. No Sick contacts Covid vaccine: moderan x2 Flu vaccine utd  [facility] States that they did test her for covid and flu. States that it was Wednesday Tessalon with no help States she has been taking mucinex, nyquill/dayquill. No great benefit   Review of Systems  Constitutional:  Positive for chills and malaise/fatigue. Negative for fever.  HENT:  Positive for ear pain (stopped up), sinus pain and sore throat. Negative for ear discharge.   Respiratory:  Positive for cough and shortness of breath. Negative for sputum production and wheezing.   Musculoskeletal:  Negative for joint pain and myalgias.  Neurological:  Positive for headaches.        Objective:    BP (!) 108/52   Pulse (!) 122   Ht '5\' 3"'$  (1.6 m)   Wt 103 lb (46.7 kg) Comment: PT REPORTED  SpO2 92%   BMI 18.25 kg/m  BP Readings from Last 3 Encounters:  11/14/22 (!) 108/52  09/02/22 107/61  08/07/22 112/64   Wt Readings from Last 3 Encounters:  11/14/22 103 lb (46.7 kg)  09/02/22 126 lb 12.2 oz (57.5 kg)  08/07/22 100 lb (45.4 kg)   SpO2 Readings from Last 3 Encounters:  11/14/22 92%  09/02/22 98%  08/07/22 99%      Physical Exam Vitals and nursing note reviewed.  Constitutional:      Appearance: Normal appearance.  HENT:     Right Ear: Tympanic membrane, ear canal and external ear normal.     Left Ear: Tympanic membrane, ear canal and external ear normal.     Mouth/Throat:     Mouth: Mucous membranes are moist.     Pharynx: Oropharynx is clear.  Cardiovascular:     Rate and Rhythm: Normal rate  and regular rhythm.     Heart sounds: Normal heart sounds.  Pulmonary:     Effort: Pulmonary effort is normal.     Breath sounds: Normal breath sounds.  Lymphadenopathy:     Cervical: No cervical adenopathy.  Neurological:     Mental Status: She is alert.     No results found for any visits on 11/14/22.      Assessment & Plan:   Problem List Items Addressed This Visit       Respiratory   Lower respiratory infection - Primary    Given length of illness and presentation and exam will like to treat with doxycycline 100 mg twice daily for 7 days for atypical lower respiratory infection.  Patient does have a history of smoking.  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  Will be used codeine-guaifenesin 5 mL 3 times daily as needed cough.  Sedation precautions reviewed with patient      Relevant Medications   guaiFENesin-codeine 100-10 MG/5ML syrup   Shortness of breath  Subjective shortness of breath lungs clear to auscultation office.  Patient's O2 sat lower than normal.  Will treat with prednisone taper.  Avoid NSAIDs.      Relevant Medications   predniSONE (DELTASONE) 20 MG tablet    Meds ordered this encounter  Medications   doxycycline (VIBRA-TABS) 100 MG tablet    Sig: Take 1 tablet (100 mg total) by mouth 2 (two) times daily for 7 days.    Dispense:  14 tablet    Refill:  0    Order Specific Question:   Supervising Provider    Answer:   TOWER, MARNE A [[id number]]   guaiFENesin-codeine 100-10 MG/5ML syrup    Sig: Take 5 mLs by mouth 3 (three) times daily as needed for cough.    Dispense:  120 mL    Refill:  0    Order Specific Question:   Supervising Provider    Answer:   Loura Pardon A [[id number]]   predniSONE (DELTASONE) 20 MG tablet    Sig: Take 1 tablet (20 mg total) by mouth 2 (two) times daily  with a meal for 3 days, THEN 1 tablet (20 mg total) daily with breakfast for 3 days. Avoid NSAIDS like: ibuprofen, aleve, naproxen, BC/Goody powders.    Dispense:  9 tablet    Refill:  0    Order Specific Question:   Supervising Provider    Answer:   Loura Pardon A [[id number]]    Return in about 3 months (around 02/13/2023) for CPE and Labs.  Romilda Garret, NP

## 2022-11-14 NOTE — Patient Instructions (Signed)
Nice to see you today I have sent in 3 medications If you do not improve let me know The cough medication will make you sleepy so use caution

## 2022-11-14 NOTE — Assessment & Plan Note (Signed)
Subjective shortness of breath lungs clear to auscultation office.  Patient's O2 sat lower than normal.  Will treat with prednisone taper.  Avoid NSAIDs.

## 2022-11-14 NOTE — Assessment & Plan Note (Signed)
Given length of illness and presentation and exam will like to treat with doxycycline 100 mg twice daily for 7 days for atypical lower respiratory infection.  Patient does have a history of smoking.  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

## 2022-11-14 NOTE — Telephone Encounter (Signed)
Patient called in and stated that [facility] is out of the guaiFENesin-codeine 100-10 MG/5ML syrup. She was wanting to know if it could be called in to [facility], [address]. Thank you!

## 2022-11-14 NOTE — Addendum Note (Signed)
Addended by: Michela Pitcher on: 11/14/2022 05:02 PM   Modules accepted: Orders

## 2022-11-17 ENCOUNTER — Telehealth: Payer: Self-pay | Admitting: Nurse Practitioner

## 2022-11-17 DIAGNOSIS — R051 Acute cough: Secondary | ICD-10-CM

## 2022-11-17 MED ORDER — GUAIFENESIN-CODEINE 100-10 MG/5ML PO SOLN: 5.0000 mL | Freq: Three times a day (TID) | ORAL | 0 refills | Status: DC | PRN

## 2022-11-17 NOTE — Telephone Encounter (Signed)
Rx at [facility] has been canceled.  Please review, thanks!

## 2022-11-17 NOTE — Telephone Encounter (Signed)
Pt called stating [facility] on [address] has meds in stock & is asking for prescription to be sent there. Call back # [phone]

## 2022-11-17 NOTE — Telephone Encounter (Signed)
Patient called in stating that pharmacy does not have guaiFENesin-codeine 100-10 MG/5ML syrup   Needs to be called in to  [place], [address] Phone: [phone]  Fax: [phone]

## 2022-11-17 NOTE — Telephone Encounter (Signed)
Medication sent to [facility] pharmacy after staff called to make sure it was in stock

## 2022-11-17 NOTE — Telephone Encounter (Signed)
Spoke with patient and informed [facility]. As this is a controlled substance we cannot continue to send this rx to various pharmacies. [facility] to confirm [facility] to fill her rx at this time. Patient is aware and [facility].  Nothing further needed at this time.

## 2022-11-20 ENCOUNTER — Telehealth (HOSPITAL_COMMUNITY): Payer: 59 | Admitting: Psychiatry

## 2022-11-20 DIAGNOSIS — Z91199 Patient's noncompliance with other medical treatment and regimen due to unspecified reason: Secondary | ICD-10-CM

## 2022-11-20 NOTE — Progress Notes (Signed)
Logged into caregility app but patient states she can either talk or use video but cannot do both at the same time. Discussed that she would have to come for in person appointment as we cannot do phone consultation.  Patient can come in person for follow-up appointment on Monday, 11/24/2022. Epic message sent to staff to reschedule patient's appointment to 2/5 afternoon.   Armando Reichert, MD PGY3 Psychiatry Resident  [facility]

## 2022-11-24 DIAGNOSIS — S92353A Displaced fracture of fifth metatarsal bone, unspecified foot, initial encounter for closed fracture: Secondary | ICD-10-CM | POA: Insufficient documentation

## 2022-12-01 ENCOUNTER — Encounter (HOSPITAL_COMMUNITY): Payer: Self-pay | Admitting: Psychiatry

## 2022-12-11 ENCOUNTER — Encounter (HOSPITAL_COMMUNITY): Payer: No Payment, Other | Admitting: Psychiatry

## 2022-12-12 ENCOUNTER — Telehealth (HOSPITAL_COMMUNITY): Payer: 59 | Admitting: Psychiatry

## 2022-12-14 ENCOUNTER — Other Ambulatory Visit (HOSPITAL_COMMUNITY): Payer: Self-pay | Admitting: Psychiatry

## 2022-12-14 DIAGNOSIS — F339 Major depressive disorder, recurrent, unspecified: Secondary | ICD-10-CM

## 2022-12-18 ENCOUNTER — Telehealth (HOSPITAL_COMMUNITY): Payer: Self-pay

## 2022-12-23 ENCOUNTER — Other Ambulatory Visit (HOSPITAL_COMMUNITY): Payer: Self-pay | Admitting: Physician Assistant

## 2022-12-23 ENCOUNTER — Telehealth (HOSPITAL_COMMUNITY): Payer: Self-pay | Admitting: *Deleted

## 2022-12-23 DIAGNOSIS — F339 Major depressive disorder, recurrent, unspecified: Secondary | ICD-10-CM

## 2022-12-23 MED ORDER — SERTRALINE HCL 50 MG PO TABS: 50.0000 mg | ORAL_TABLET | Freq: Every day | ORAL | 1 refills | Status: DC

## 2022-12-23 NOTE — Telephone Encounter (Signed)
Message acknowledged and reviewed. I will fill patient's sertraline prescription.

## 2022-12-23 NOTE — Telephone Encounter (Signed)
Message acknowledged and reviewed. Patient's medication to be e-prescribed to pharmacy of choice.

## 2022-12-23 NOTE — Progress Notes (Signed)
Provider was contacted by Glory Buff. Beck, regarding patient's medication refill. Patient is requesting refills on her Sertraline. Patient's medication to be e-prescribed to pharmacy of choice. Patient has a follow up appointment with Armando Reichert, MD scheduled for 12/26/2022.

## 2022-12-23 NOTE — Telephone Encounter (Signed)
Pharmacy faxed request for sertraline, she last filled on 11/18/22 . She has an appt with Dr Rosita Kea 12/26/22. She should be out of meds but Dr Lu Duffel have her wait till her appt, last two appts were cancelled but I dont know by who. I will forward the concern to the provider.

## 2022-12-24 ENCOUNTER — Other Ambulatory Visit (HOSPITAL_COMMUNITY): Payer: Self-pay | Admitting: Psychiatry

## 2022-12-24 DIAGNOSIS — F339 Major depressive disorder, recurrent, unspecified: Secondary | ICD-10-CM

## 2022-12-24 DIAGNOSIS — G4700 Insomnia, unspecified: Secondary | ICD-10-CM

## 2022-12-24 DIAGNOSIS — F1090 Alcohol use, unspecified, uncomplicated: Secondary | ICD-10-CM

## 2022-12-26 ENCOUNTER — Telehealth (HOSPITAL_COMMUNITY): Payer: 59 | Admitting: Psychiatry

## 2022-12-30 ENCOUNTER — Telehealth: Payer: Self-pay | Admitting: Nurse Practitioner

## 2022-12-30 NOTE — Telephone Encounter (Signed)
Pt called in stated she is out of town and forgot all her medication and is requesting a [facility] [place] . Pt did not know what RX stated all the ones she takes . Please advise # [phone]

## 2022-12-31 ENCOUNTER — Telehealth (HOSPITAL_COMMUNITY): Payer: Self-pay | Admitting: *Deleted

## 2022-12-31 MED ORDER — PANTOPRAZOLE SODIUM 40 MG PO TBEC: 40.0000 mg | DELAYED_RELEASE_TABLET | Freq: Every day | ORAL | 0 refills | Status: DC

## 2022-12-31 NOTE — Telephone Encounter (Signed)
Pt called back returning [facility] call. Told pt [employer]'s response. Pt asked who prescribed gabapentin (NEURONTIN) 300 MG capsule & QUEtiapine (SEROQUEL) 300 MG tablet, told pt the provider. Call back # [phone]

## 2022-12-31 NOTE — Telephone Encounter (Signed)
Left message to return call to our office.  

## 2022-12-31 NOTE — Telephone Encounter (Signed)
The only medication I write for her is protonix. I will send in a two week supply. The other medications are from other providers

## 2022-12-31 NOTE — Telephone Encounter (Addendum)
Fax received for refill request of Seroquel '300mg'$  and Gabapentin '300mg'$ .

## 2022-12-31 NOTE — Telephone Encounter (Signed)
Left message to return call to our office. Her behavior health prescribed those medicine for her.

## 2023-01-01 ENCOUNTER — Other Ambulatory Visit (HOSPITAL_COMMUNITY): Payer: Self-pay | Admitting: Physician Assistant

## 2023-01-01 ENCOUNTER — Telehealth (HOSPITAL_COMMUNITY): Payer: Self-pay | Admitting: *Deleted

## 2023-01-01 DIAGNOSIS — G4700 Insomnia, unspecified: Secondary | ICD-10-CM

## 2023-01-01 DIAGNOSIS — F1090 Alcohol use, unspecified, uncomplicated: Secondary | ICD-10-CM

## 2023-01-01 DIAGNOSIS — F339 Major depressive disorder, recurrent, unspecified: Secondary | ICD-10-CM

## 2023-01-01 MED ORDER — GABAPENTIN 300 MG PO CAPS: 300.0000 mg | ORAL_CAPSULE | Freq: Three times a day (TID) | ORAL | 2 refills | Status: DC

## 2023-01-01 MED ORDER — QUETIAPINE FUMARATE 300 MG PO TABS: 300.0000 mg | ORAL_TABLET | Freq: Every day | ORAL | 2 refills | Status: DC

## 2023-01-01 NOTE — Telephone Encounter (Signed)
Message acknowledged and reviewed. Medication to be e-prescribed to pharmacy of choice.

## 2023-01-01 NOTE — Progress Notes (Signed)
Provider was contacted by Claudius Sis, RN regarding medication refill. Provider to e-prescribe patient's medication to pharmacy of choice.

## 2023-01-01 NOTE — Telephone Encounter (Signed)
VM from patient needing her meds to be sent to [facility] which is where she currently is. Called her back after reviewing chart. Rx sent to her pharmacy today per [place] [facility]. Called her and left a message we are not able to sent meds out of [place]. She can call her pharmacy which is a [facility] and ask them to move it to one of there pharmacies in [place] [facility] [place] [facility] [place].

## 2023-01-10 ENCOUNTER — Other Ambulatory Visit: Payer: Self-pay | Admitting: Nurse Practitioner

## 2023-01-13 ENCOUNTER — Encounter (HOSPITAL_COMMUNITY): Payer: Self-pay | Admitting: Physician Assistant

## 2023-01-13 ENCOUNTER — Ambulatory Visit (INDEPENDENT_AMBULATORY_CARE_PROVIDER_SITE_OTHER): Payer: 59 | Admitting: Physician Assistant

## 2023-01-13 DIAGNOSIS — F1090 Alcohol use, unspecified, uncomplicated: Secondary | ICD-10-CM

## 2023-01-13 DIAGNOSIS — Z634 Disappearance and death of family member: Secondary | ICD-10-CM

## 2023-01-13 DIAGNOSIS — F411 Generalized anxiety disorder: Secondary | ICD-10-CM | POA: Diagnosis not present

## 2023-01-13 DIAGNOSIS — G4700 Insomnia, unspecified: Secondary | ICD-10-CM | POA: Diagnosis not present

## 2023-01-13 DIAGNOSIS — F339 Major depressive disorder, recurrent, unspecified: Secondary | ICD-10-CM

## 2023-01-13 DIAGNOSIS — F1021 Alcohol dependence, in remission: Secondary | ICD-10-CM

## 2023-01-13 MED ORDER — SERTRALINE HCL 25 MG PO TABS: ORAL_TABLET | ORAL | 0 refills | Status: DC

## 2023-01-13 MED ORDER — QUETIAPINE FUMARATE 300 MG PO TABS: 300.0000 mg | ORAL_TABLET | Freq: Every day | ORAL | 2 refills | Status: DC

## 2023-01-13 MED ORDER — GABAPENTIN 300 MG PO CAPS: 300.0000 mg | ORAL_CAPSULE | Freq: Three times a day (TID) | ORAL | 2 refills | Status: DC

## 2023-01-13 MED ORDER — HYDROXYZINE HCL 50 MG PO TABS: 50.0000 mg | ORAL_TABLET | Freq: Three times a day (TID) | ORAL | 1 refills | Status: DC | PRN

## 2023-01-13 MED ORDER — TRAZODONE HCL 100 MG PO TABS: 100.0000 mg | ORAL_TABLET | Freq: Every day | ORAL | 1 refills | Status: DC
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
## 2023-01-14 ENCOUNTER — Telehealth (HOSPITAL_COMMUNITY): Payer: Self-pay | Admitting: *Deleted

## 2023-01-14 ENCOUNTER — Other Ambulatory Visit (HOSPITAL_COMMUNITY): Payer: Self-pay | Admitting: Physician Assistant

## 2023-01-14 DIAGNOSIS — F339 Major depressive disorder, recurrent, unspecified: Secondary | ICD-10-CM

## 2023-01-14 DIAGNOSIS — F411 Generalized anxiety disorder: Secondary | ICD-10-CM

## 2023-01-14 MED ORDER — FLUOXETINE HCL 10 MG PO CAPS: 10.0000 mg | ORAL_CAPSULE | Freq: Every day | ORAL | 1 refills | Status: DC

## 2023-01-14 NOTE — Telephone Encounter (Signed)
Provider was contacted by Cherylann Ratel, RN regarding voicemail left by patient.  Provider was able to reach out to patient regarding her medications.  Patient reports that she has been on Zoloft in the past for the management of her anxiety and states that the medication has not been effective.  Provider discussed using Prozac 10 mg daily to manage her anxiety.  Patient was open to the idea of utilizing medication.  Patient's medication to be e-prescribed to pharmacy of choice.

## 2023-01-14 NOTE — Progress Notes (Signed)
Provider was able to reach out to patient regarding her medications.  Patient reports that she has been on Zoloft in the past for the management of her anxiety and states that the medication has not been effective.  Provider discussed using Prozac 10 mg daily to manage her anxiety.  Patient was open to the idea of utilizing medication.  Patient's medication to be e-prescribed to pharmacy of choice.

## 2023-01-14 NOTE — Telephone Encounter (Signed)
Patient called left voicemail stating that she found out that she has been taking Sertraline and did not want to be on Zoloft. She would like a call back to discuss an alternative medication because it is not working for her.

## 2023-01-28 ENCOUNTER — Other Ambulatory Visit: Payer: Self-pay

## 2023-02-04 ENCOUNTER — Telehealth: Payer: Self-pay

## 2023-02-04 ENCOUNTER — Encounter: Payer: Self-pay | Admitting: Gastroenterology

## 2023-02-04 ENCOUNTER — Other Ambulatory Visit: Payer: Self-pay

## 2023-02-04 ENCOUNTER — Ambulatory Visit (INDEPENDENT_AMBULATORY_CARE_PROVIDER_SITE_OTHER): Payer: 59 | Admitting: Gastroenterology

## 2023-02-04 VITALS — BP 110/70 | HR 84 | Temp 99.1°F | Ht 63.0 in

## 2023-02-04 DIAGNOSIS — K219 Gastro-esophageal reflux disease without esophagitis: Secondary | ICD-10-CM

## 2023-02-04 DIAGNOSIS — K5909 Other constipation: Secondary | ICD-10-CM | POA: Diagnosis not present

## 2023-02-04 DIAGNOSIS — Z1211 Encounter for screening for malignant neoplasm of colon: Secondary | ICD-10-CM

## 2023-02-04 DIAGNOSIS — I5181 Takotsubo syndrome: Secondary | ICD-10-CM

## 2023-02-04 DIAGNOSIS — I255 Ischemic cardiomyopathy: Secondary | ICD-10-CM

## 2023-02-04 MED ORDER — NA SULFATE-K SULFATE-MG SULF 17.5-3.13-1.6 GM/177ML PO SOLN: 354.0000 mL | Freq: Once | ORAL | 0 refills | Status: AC

## 2023-02-04 MED ORDER — LINACLOTIDE 72 MCG PO CAPS: 72.0000 ug | ORAL_CAPSULE | Freq: Every day | ORAL | 2 refills | Status: DC

## 2023-02-04 NOTE — Telephone Encounter (Signed)
It has been more than 2 years since patient saw a cardiologist.  If patient is agreeable, recommend referral to cardiology regarding prolonged QT interval and her history of Takotsubo cardiomyopathy.  Depending on patient's preference, patient can be referred to [facility] clinic cardiology or [facility] cardiology and obtain cardiac clearance prior to her colonoscopy  Carla Martinez

## 2023-02-04 NOTE — Telephone Encounter (Addendum)
Patient states last time she did the prep she got nausea and started vomiting up the prep. Informed patient I would call in Zofran  to take as needed during the prep. 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 Consider removing the following QT Prolonging medication(s). For more information, providers can review medications that prolong the QT interval at www.[email or web address] (registration required).    Last K, Collected: 08/31/2022  2:03 AM = 4.0 mmol/L Last MG, Collected: 03/06/2022  6:07 PM = 1.7 mg/dL
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
## 2023-02-05 ENCOUNTER — Other Ambulatory Visit (HOSPITAL_COMMUNITY): Payer: Self-pay | Admitting: Orthopedic Surgery

## 2023-02-05 ENCOUNTER — Telehealth: Payer: Self-pay

## 2023-02-05 DIAGNOSIS — M25551 Pain in right hip: Secondary | ICD-10-CM

## 2023-02-05 NOTE — Telephone Encounter (Signed)
Submitted PA through cover my meds for Linzess . Waiting on response from insurance company

## 2023-02-05 NOTE — Telephone Encounter (Signed)
Insurance has approved medication from 02/05/2023 to 02/05/2024

## 2023-02-05 NOTE — Telephone Encounter (Signed)
Patient is okay with referral to cardiology and does not care who she is seen by because she has not had no problems. Put referral as urgent since procedure is schedule for 02/19/2023. Informed patient I would leave procedure as schedule so hopefully they will get her seen and cleared by procedure date

## 2023-02-05 NOTE — Addendum Note (Signed)
Addended by: Radene Knee L on: 02/05/2023 11:27 AM   Modules accepted: Orders

## 2023-02-05 NOTE — Telephone Encounter (Signed)
Called and left a message for call back  

## 2023-02-10 ENCOUNTER — Telehealth: Payer: Self-pay | Admitting: Gastroenterology

## 2023-02-10 ENCOUNTER — Other Ambulatory Visit (HOSPITAL_COMMUNITY): Payer: Self-pay | Admitting: Physician Assistant

## 2023-02-10 DIAGNOSIS — M25561 Pain in right knee: Secondary | ICD-10-CM

## 2023-02-10 NOTE — Telephone Encounter (Signed)
Pt left message returning call please return call 

## 2023-02-16 ENCOUNTER — Encounter: Payer: Self-pay | Admitting: Cardiology

## 2023-02-16 ENCOUNTER — Telehealth: Payer: Self-pay

## 2023-02-16 ENCOUNTER — Ambulatory Visit: Payer: 59 | Attending: Cardiology | Admitting: Cardiology

## 2023-02-16 VITALS — BP 112/70 | HR 73 | Ht 63.0 in | Wt 104.0 lb

## 2023-02-16 DIAGNOSIS — Z01810 Encounter for preprocedural cardiovascular examination: Secondary | ICD-10-CM | POA: Diagnosis not present

## 2023-02-16 DIAGNOSIS — I5181 Takotsubo syndrome: Secondary | ICD-10-CM

## 2023-02-16 DIAGNOSIS — I471 Supraventricular tachycardia, unspecified: Secondary | ICD-10-CM

## 2023-02-16 MED ORDER — METOPROLOL SUCCINATE ER 25 MG PO TB24: 25.0000 mg | ORAL_TABLET | Freq: Every day | ORAL | 3 refills | Status: DC

## 2023-02-16 NOTE — Patient Instructions (Signed)
Medication Instructions:   START Metoprolol Succinate - Tale one tablet (25mg ) by mouth daily.  *If you need a refill on your cardiac medications before your next appointment, please call your pharmacy*   Lab Work:  None Ordered  If you have labs (blood work) drawn today and your tests are completely normal, you will receive your results only by: MyChart Message (if you have MyChart) OR A paper copy in the mail If you have any lab test that is abnormal or we need to change your treatment, we will call you to review the results.   Testing/Procedures:  Your physician has requested that you have an echocardiogram ASAP in our office or Hospital . Echocardiography is a painless test that uses sound waves to create images of your heart. It provides your doctor with information about the size and shape of your heart and how well your heart's chambers and valves are working. This procedure takes approximately one hour. There are no restrictions for this procedure. Please do NOT wear cologne, perfume, aftershave, or lotions (deodorant is allowed). Please arrive 15 minutes prior to your appointment time.    Follow-Up: At [facility], you and your health needs are our priority.  As part of our continuing mission to provide you with exceptional heart care, we have created designated Provider Care Teams.  These Care Teams include your primary Cardiologist (physician) and Advanced Practice Providers (APPs -  Physician Assistants and Nurse Practitioners) who all work together to provide you with the care you need, when you need it.  We recommend signing up for the patient portal called "MyChart".  Sign up information is provided on this After Visit Summary.  MyChart is used to connect with patients for Virtual Visits (Telemedicine).  Patients are able to view lab/test results, encounter notes, upcoming appointments, etc.  Non-urgent messages can be sent to your provider as well.   To learn  more about what you can do with MyChart, go to [email or web address].    Your next appointment:   6 - 8  week(s)  Provider:   You may see Debbe Odea, MD or one of the following Advanced Practice Providers on your designated Care Team:   Nicolasa Ducking, NP Eula Listen, PA-C Cadence Fransico Michael, PA-C Charlsie Quest, NP
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
## 2023-02-16 NOTE — Telephone Encounter (Signed)
Per cardiologist office visit Plan:  Preop evaluation, colonoscopy and EGD being planned. She is asymptomatic with no chest pain, no shortness of breath, no edema no palpitations. Based on previous echo, patient is moderate risk. Okay to proceed with procedure, restart Toprol-XL as above.

## 2023-02-17 ENCOUNTER — Telehealth: Payer: Self-pay

## 2023-02-17 NOTE — Telephone Encounter (Signed)
Pt left message would like more info on upcoming procedure please return call

## 2023-02-17 NOTE — Telephone Encounter (Signed)
Patient was calling to make sure she was cleared to have the colonoscopy on 02/19/2023. Informed patient yes the cardiologist cleared her.

## 2023-02-17 NOTE — Telephone Encounter (Signed)
Called and left a message for call back  

## 2023-02-19 ENCOUNTER — Ambulatory Visit: Payer: 59 | Admitting: Anesthesiology

## 2023-02-19 ENCOUNTER — Encounter: Payer: Self-pay | Admitting: Gastroenterology

## 2023-02-19 ENCOUNTER — Ambulatory Visit
Admission: RE | Admit: 2023-02-19 | Discharge: 2023-02-19 | Disposition: A | Discharge status: Home / Self Care | Payer: 59 | Attending: Gastroenterology | Admitting: Gastroenterology

## 2023-02-19 ENCOUNTER — Encounter
Admission: RE | Disposition: A | Discharge status: Home / Self Care | Payer: Self-pay | Source: Home / Self Care | Attending: Gastroenterology

## 2023-02-19 DIAGNOSIS — K319 Disease of stomach and duodenum, unspecified: Secondary | ICD-10-CM | POA: Diagnosis not present

## 2023-02-19 DIAGNOSIS — I509 Heart failure, unspecified: Secondary | ICD-10-CM | POA: Diagnosis not present

## 2023-02-19 DIAGNOSIS — Z87891 Personal history of nicotine dependence: Secondary | ICD-10-CM | POA: Insufficient documentation

## 2023-02-19 DIAGNOSIS — F4323 Adjustment disorder with mixed anxiety and depressed mood: Secondary | ICD-10-CM | POA: Insufficient documentation

## 2023-02-19 DIAGNOSIS — K219 Gastro-esophageal reflux disease without esophagitis: Secondary | ICD-10-CM | POA: Insufficient documentation

## 2023-02-19 DIAGNOSIS — R1013 Epigastric pain: Secondary | ICD-10-CM | POA: Insufficient documentation

## 2023-02-19 DIAGNOSIS — I2510 Atherosclerotic heart disease of native coronary artery without angina pectoris: Secondary | ICD-10-CM | POA: Diagnosis not present

## 2023-02-19 DIAGNOSIS — K3189 Other diseases of stomach and duodenum: Secondary | ICD-10-CM | POA: Insufficient documentation

## 2023-02-19 DIAGNOSIS — D122 Benign neoplasm of ascending colon: Secondary | ICD-10-CM | POA: Insufficient documentation

## 2023-02-19 DIAGNOSIS — K635 Polyp of colon: Secondary | ICD-10-CM

## 2023-02-19 DIAGNOSIS — F419 Anxiety disorder, unspecified: Secondary | ICD-10-CM | POA: Insufficient documentation

## 2023-02-19 DIAGNOSIS — Z1211 Encounter for screening for malignant neoplasm of colon: Secondary | ICD-10-CM | POA: Insufficient documentation

## 2023-02-19 DIAGNOSIS — K2960 Other gastritis without bleeding: Secondary | ICD-10-CM | POA: Insufficient documentation

## 2023-02-19 DIAGNOSIS — I480 Paroxysmal atrial fibrillation: Secondary | ICD-10-CM | POA: Diagnosis not present

## 2023-02-19 DIAGNOSIS — Z993 Dependence on wheelchair: Secondary | ICD-10-CM | POA: Diagnosis not present

## 2023-02-19 DIAGNOSIS — G4700 Insomnia, unspecified: Secondary | ICD-10-CM | POA: Insufficient documentation

## 2023-02-19 DIAGNOSIS — R140 Abdominal distension (gaseous): Secondary | ICD-10-CM | POA: Diagnosis not present

## 2023-02-19 HISTORY — PX: COLONOSCOPY WITH PROPOFOL: SHX5780

## 2023-02-19 HISTORY — PX: ESOPHAGOGASTRODUODENOSCOPY (EGD) WITH PROPOFOL: SHX5813

## 2023-02-19 SURGERY — COLONOSCOPY WITH PROPOFOL
Anesthesia: General

## 2023-02-19 MED ORDER — EPHEDRINE SULFATE (PRESSORS) 50 MG/ML IJ SOLN
INTRAMUSCULAR | Status: DC | PRN
  Administered 2023-02-19: 10 mg via INTRAVENOUS
  Administered 2023-02-19: 15 mg via INTRAVENOUS

## 2023-02-19 MED ORDER — PROPOFOL 1000 MG/100ML IV EMUL
INTRAVENOUS | Status: AC
  Filled 2023-02-19: qty 100

## 2023-02-19 MED ORDER — PROPOFOL 500 MG/50ML IV EMUL
INTRAVENOUS | Status: DC | PRN
  Administered 2023-02-19: 100 ug/kg/min via INTRAVENOUS

## 2023-02-19 MED ORDER — LIDOCAINE HCL (PF) 2 % IJ SOLN
INTRAMUSCULAR | Status: AC
  Filled 2023-02-19: qty 5

## 2023-02-19 MED ORDER — PHENYLEPHRINE 80 MCG/ML (10ML) SYRINGE FOR IV PUSH (FOR BLOOD PRESSURE SUPPORT)
PREFILLED_SYRINGE | INTRAVENOUS | Status: DC | PRN
  Administered 2023-02-19: 160 ug via INTRAVENOUS
  Administered 2023-02-19: 80 ug via INTRAVENOUS

## 2023-02-19 MED ORDER — GLYCOPYRROLATE 0.2 MG/ML IJ SOLN
INTRAMUSCULAR | Status: DC | PRN
  Administered 2023-02-19: .1 mg via INTRAVENOUS

## 2023-02-19 MED ORDER — SODIUM CHLORIDE 0.9 % IV SOLN: INTRAVENOUS | Status: DC

## 2023-02-19 MED ORDER — LIDOCAINE HCL (CARDIAC) PF 100 MG/5ML IV SOSY
PREFILLED_SYRINGE | INTRAVENOUS | Status: DC | PRN
  Administered 2023-02-19: 50 mg via INTRAVENOUS

## 2023-02-19 MED ORDER — GLYCOPYRROLATE 0.2 MG/ML IJ SOLN
INTRAMUSCULAR | Status: AC
  Filled 2023-02-19: qty 1

## 2023-02-19 MED ORDER — PHENYLEPHRINE 80 MCG/ML (10ML) SYRINGE FOR IV PUSH (FOR BLOOD PRESSURE SUPPORT)
PREFILLED_SYRINGE | INTRAVENOUS | Status: AC
  Filled 2023-02-19: qty 10

## 2023-02-19 MED ORDER — PROPOFOL 10 MG/ML IV BOLUS
INTRAVENOUS | Status: DC | PRN
  Administered 2023-02-19: 30 mg via INTRAVENOUS
  Administered 2023-02-19: 20 mg via INTRAVENOUS
  Administered 2023-02-19: 100 mg via INTRAVENOUS
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
## 2023-02-19 NOTE — Anesthesia Postprocedure Evaluation (Signed)
Anesthesia Post Note  Patient: Latarshia Jersey  Procedure(s) Performed: COLONOSCOPY WITH PROPOFOL ESOPHAGOGASTRODUODENOSCOPY (EGD) WITH PROPOFOL  Patient location during evaluation: Endoscopy Anesthesia Type: General Level of consciousness: awake and alert Pain management: pain level controlled Vital Signs Assessment: post-procedure vital signs reviewed and stable Respiratory status: spontaneous breathing, nonlabored ventilation, respiratory function stable and patient connected to nasal cannula oxygen Cardiovascular status: blood pressure returned to baseline and stable Postop Assessment: no apparent nausea or vomiting Anesthetic complications: no  No notable events documented.   Last Vitals:  Vitals:   02/19/23 0858 02/19/23 0908  BP: 122/73 120/71  Pulse: 91 81  Resp: 15 18  Temp:    SpO2: 99% 96%    Last Pain:  Vitals:   02/19/23 0908  TempSrc:   PainSc: 0-No pain                 Stephanie Coup

## 2023-02-19 NOTE — Op Note (Signed)
[facility] Patient Name: Carla Martinez Procedure Date: 02/19/2023 7:29 AM MRN: [id number] Account #: [id number] Date of Birth: 07/11/1961 Admit Type: Outpatient Age: 62 Room: [facility] ENDO ROOM 3 Gender: Female Note Status: Finalized Instrument Name: Upper Endoscope [id number] Procedure:             Upper GI endoscopy Indications:           Epigastric abdominal pain, Abdominal bloating Providers:             Toney Reil MD, MD Referring MD:          Toney Reil MD, MD (Referring MD), Genene Churn.                         Toney Reil (Referring MD) Medicines:             General Anesthesia Complications:         No immediate complications. Estimated blood loss: None. Procedure:             Pre-Anesthesia Assessment:                        - Prior to the procedure, a History and Physical was                         performed, and patient medications and allergies were                         reviewed. The patient is competent. The risks and                         benefits of the procedure and the sedation options and                         risks were discussed with the patient. All questions                         were answered and informed consent was obtained.                         Patient identification and proposed procedure were                         verified by the physician, the nurse, the                         anesthesiologist, the anesthetist and the technician                         in the pre-procedure area in the procedure room in the                         endoscopy suite. Mental Status Examination: alert and                         oriented. Airway Examination: normal oropharyngeal                         airway and neck mobility. Respiratory Examination:  clear to auscultation. CV Examination: normal.                         Prophylactic Antibiotics: The patient does not require                          prophylactic antibiotics. Prior Anticoagulants: The                         patient has taken no anticoagulant or antiplatelet                         agents. ASA Grade Assessment: III - A patient with                         severe systemic disease. After reviewing the risks and                         benefits, the patient was deemed in satisfactory                         condition to undergo the procedure. The anesthesia                         plan was to use general anesthesia. Immediately prior                         to administration of medications, the patient was                         re-assessed for adequacy to receive sedatives. The                         heart rate, respiratory rate, oxygen saturations,                         blood pressure, adequacy of pulmonary ventilation, and                         response to care were monitored throughout the                         procedure. The physical status of the patient was                         re-assessed after the procedure.                        After obtaining informed consent, the endoscope was                         passed under direct vision. Throughout the procedure,                         the patient's blood pressure, pulse, and oxygen                         saturations were monitored continuously. The Endoscope  was introduced through the mouth, and advanced to the                         second part of duodenum. The upper GI endoscopy was                         accomplished without difficulty. The patient tolerated                         the procedure well. Findings:      The duodenal bulb and second portion of the duodenum were normal.       Biopsies for histology were taken with a cold forceps for evaluation of       celiac disease.      Diffuse mildly erythematous mucosa without bleeding was found on the       greater curvature of the gastric body, at the incisura and in the        gastric antrum. Biopsies were taken with a cold forceps for Helicobacter       pylori testing.      The cardia and gastric fundus were normal on retroflexion.      The gastroesophageal junction and examined esophagus were normal. Impression:            - Normal duodenal bulb and second portion of the                         duodenum. Biopsied.                        - Erythematous mucosa in the greater curvature of the                         gastric body, incisura and antrum. Biopsied.                        - Normal gastroesophageal junction and esophagus. Recommendation:        - Await pathology results.                        - Proceed with colonoscopy as scheduled                        See colonoscopy report Procedure Code(s):     --- Professional ---                        [phone], Esophagogastroduodenoscopy, flexible,                         transoral; with biopsy, single or multiple Diagnosis Code(s):     --- Professional ---                        K31.89, Other diseases of stomach and duodenum                        R14.0, Abdominal distension (gaseous)                        R10.13, Epigastric pain CPT copyright 2022 American Medical Association. All rights reserved. The  codes documented in this report are preliminary and upon coder review may  be revised to meet current compliance requirements. Dr. Libby Maw Toney Reil MD, MD 02/19/2023 8:26:01 AM This report has been signed electronically. Number of Addenda: 0 Note Initiated On: 02/19/2023 7:29 AM Estimated Blood Loss:  Estimated blood loss: none.      [facility]

## 2023-02-19 NOTE — Transfer of Care (Signed)
Immediate Anesthesia Transfer of Care Note  Patient: Carla Martinez  Procedure(s) Performed: COLONOSCOPY WITH PROPOFOL ESOPHAGOGASTRODUODENOSCOPY (EGD) WITH PROPOFOL  Patient Location: PACU and Endoscopy Unit  Anesthesia Type:General  Level of Consciousness: awake, alert , and oriented  Airway & Oxygen Therapy: Patient Spontanous Breathing  Post-op Assessment: Report given to RN and Post -op Vital signs reviewed and stable  Post vital signs: Reviewed and stable  Last Vitals:  Vitals Value Taken Time  BP 91/65 02/19/23 0849  Temp 36.2 C 02/19/23 0848  Pulse 92 02/19/23 0849  Resp 28 02/19/23 0849  SpO2 98 % 02/19/23 0849  Vitals shown include unvalidated device data.  Last Pain:  Vitals:   02/19/23 0848  TempSrc: Temporal  PainSc: Asleep         Complications: No notable events documented.
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
## 2023-02-19 NOTE — Op Note (Signed)
[facility] Patient Name: Carla Martinez Procedure Date: 02/19/2023 7:28 AM MRN: [id number] Account #: [id number] Date of Birth: 1961-07-21 Admit Type: Outpatient Age: 62 Room: [facility] ENDO ROOM 3 Gender: Female Note Status: Finalized Instrument Name: Nelda Marseille [id number] Procedure:             Colonoscopy Indications:           Screening for colorectal malignant neoplasm, This is                         the patient's first colonoscopy Providers:             Toney Reil MD, MD Referring MD:          Toney Reil MD, MD (Referring MD), Genene Churn.                         Toney Reil (Referring MD) Medicines:             General Anesthesia Complications:         No immediate complications. Estimated blood loss: None. Procedure:             Pre-Anesthesia Assessment:                        - Prior to the procedure, a History and Physical was                         performed, and patient medications and allergies were                         reviewed. The patient is competent. The risks and                         benefits of the procedure and the sedation options and                         risks were discussed with the patient. All questions                         were answered and informed consent was obtained.                         Patient identification and proposed procedure were                         verified by the physician, the nurse, the                         anesthesiologist, the anesthetist and the technician                         in the pre-procedure area in the procedure room in the                         endoscopy suite. Mental Status Examination: alert and                         oriented. Airway Examination: normal oropharyngeal  airway and neck mobility. Respiratory Examination:                         clear to auscultation. CV Examination: normal.                         Prophylactic Antibiotics: The  patient does not require                         prophylactic antibiotics. Prior Anticoagulants: The                         patient has taken no anticoagulant or antiplatelet                         agents. ASA Grade Assessment: III - A patient with                         severe systemic disease. After reviewing the risks and                         benefits, the patient was deemed in satisfactory                         condition to undergo the procedure. The anesthesia                         plan was to use general anesthesia. Immediately prior                         to administration of medications, the patient was                         re-assessed for adequacy to receive sedatives. The                         heart rate, respiratory rate, oxygen saturations,                         blood pressure, adequacy of pulmonary ventilation, and                         response to care were monitored throughout the                         procedure. The physical status of the patient was                         re-assessed after the procedure.                        After obtaining informed consent, the colonoscope was                         passed under direct vision. Throughout the procedure,                         the patient's blood pressure, pulse, and oxygen  saturations were monitored continuously. The                         Colonoscope was introduced through the anus and                         advanced to the the cecum, identified by appendiceal                         orifice and ileocecal valve. The colonoscopy was                         performed with moderate difficulty due to inadequate                         bowel prep. The patient tolerated the procedure well.                         The quality of the bowel preparation was poor. The                         ileocecal valve, appendiceal orifice, and rectum were                          photographed. Findings:      The perianal and digital rectal examinations were normal. Pertinent       negatives include normal sphincter tone and no palpable rectal lesions.      Two sessile polyps were found in the transverse colon. The polyps were 4       to 5 mm in size. These polyps were removed with a cold snare. Resection       and retrieval were complete.      A diminutive polyp was found in the ascending colon. The polyp was       sessile. The polyp was removed with a jumbo cold forceps. Resection and       retrieval were complete.      Copious quantities of semi-liquid stool was found in the rectum, in the       recto-sigmoid colon, in the sigmoid colon and in the descending colon,       precluding visualization.      The retroflexed view of the distal rectum and anal verge was normal and       showed no anal or rectal abnormalities. Impression:            - Preparation of the colon was poor.                        - Two 4 to 5 mm polyps in the transverse colon,                         removed with a cold snare. Resected and retrieved.                        - One diminutive polyp in the ascending colon, removed                         with a jumbo cold forceps. Resected and retrieved.                        -  Stool in the rectum, in the recto-sigmoid colon, in                         the sigmoid colon and in the descending colon.                        - The distal rectum and anal verge are normal on                         retroflexion view. Recommendation:        - Discharge patient to home (with escort).                        - Resume previous diet today.                        - Repeat colonoscopy either tomorrow with repeat bowel                         prep or in 6 months with 2 day prep because the bowel                         preparation was suboptimal. Procedure Code(s):     --- Professional ---                        [phone], Colonoscopy, flexible; with removal of                          tumor(s), polyp(s), or other lesion(s) by snare                         technique                        [id number], 59, Colonoscopy, flexible; with biopsy, single                         or multiple Diagnosis Code(s):     --- Professional ---                        Z12.11, Encounter for screening for malignant neoplasm                         of colon                        D12.3, Benign neoplasm of transverse colon (hepatic                         flexure or splenic flexure)                        D12.2, Benign neoplasm of ascending colon CPT copyright 2022 [employer]. All rights reserved. The codes documented in this report are preliminary and upon coder review may  be revised to meet current compliance requirements. Dr. Libby Maw Toney Reil MD, MD 02/19/2023 8:47:05 AM This report has been signed electronically. Number of Addenda: 0 Note Initiated On: 02/19/2023 7:28 AM Scope Withdrawal Time:  0 hours 11 minutes 57 seconds  Total Procedure Duration: 0 hours 14 minutes 48 seconds  Estimated Blood Loss:  Estimated blood loss: none.      [facility]

## 2023-02-20 ENCOUNTER — Encounter: Payer: Self-pay | Admitting: Gastroenterology

## 2023-02-23 ENCOUNTER — Encounter: Payer: Self-pay | Admitting: Gastroenterology

## 2023-02-23 LAB — SURGICAL PATHOLOGY

## 2023-02-23 NOTE — Telephone Encounter (Signed)
error 

## 2023-02-24 ENCOUNTER — Encounter (HOSPITAL_COMMUNITY): Payer: Self-pay

## 2023-02-24 ENCOUNTER — Telehealth (HOSPITAL_COMMUNITY): Payer: Self-pay | Admitting: Physician Assistant

## 2023-02-25 ENCOUNTER — Ambulatory Visit (HOSPITAL_COMMUNITY)
Admission: RE | Admit: 2023-02-25 | Discharge: 2023-02-25 | Disposition: A | Discharge status: Home / Self Care | Payer: 59 | Source: Ambulatory Visit | Attending: Orthopedic Surgery | Admitting: Orthopedic Surgery

## 2023-02-25 ENCOUNTER — Telehealth (INDEPENDENT_AMBULATORY_CARE_PROVIDER_SITE_OTHER): Payer: 59 | Admitting: Physician Assistant

## 2023-02-25 ENCOUNTER — Encounter: Payer: Self-pay | Admitting: Gastroenterology

## 2023-02-25 DIAGNOSIS — F1090 Alcohol use, unspecified, uncomplicated: Secondary | ICD-10-CM | POA: Diagnosis not present

## 2023-02-25 DIAGNOSIS — M25561 Pain in right knee: Secondary | ICD-10-CM | POA: Diagnosis present

## 2023-02-25 DIAGNOSIS — F411 Generalized anxiety disorder: Secondary | ICD-10-CM

## 2023-02-25 DIAGNOSIS — M25551 Pain in right hip: Secondary | ICD-10-CM | POA: Diagnosis not present

## 2023-02-25 DIAGNOSIS — F339 Major depressive disorder, recurrent, unspecified: Secondary | ICD-10-CM | POA: Diagnosis not present

## 2023-02-25 DIAGNOSIS — G4700 Insomnia, unspecified: Secondary | ICD-10-CM

## 2023-02-25 DIAGNOSIS — Z634 Disappearance and death of family member: Secondary | ICD-10-CM

## 2023-02-25 MED ORDER — TRAZODONE HCL 100 MG PO TABS
100.0000 mg | ORAL_TABLET | Freq: Every day | ORAL | 1 refills | Status: DC
Start: 2023-02-25 — End: 2023-05-25

## 2023-02-25 MED ORDER — GABAPENTIN 300 MG PO CAPS
300.0000 mg | ORAL_CAPSULE | Freq: Three times a day (TID) | ORAL | 1 refills | Status: DC
Start: 2023-02-25 — End: 2023-05-25

## 2023-02-25 MED ORDER — FLUOXETINE HCL 20 MG PO CAPS
20.0000 mg | ORAL_CAPSULE | Freq: Every day | ORAL | 1 refills | Status: DC
Start: 2023-02-25 — End: 2023-05-25

## 2023-02-25 MED ORDER — QUETIAPINE FUMARATE 300 MG PO TABS: 300.0000 mg | ORAL_TABLET | Freq: Every day | ORAL | 1 refills | Status: DC
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
## 2023-02-26 ENCOUNTER — Encounter (HOSPITAL_COMMUNITY): Payer: Self-pay | Admitting: Physician Assistant

## 2023-03-04 ENCOUNTER — Telehealth: Payer: Self-pay | Admitting: Nurse Practitioner

## 2023-03-04 ENCOUNTER — Encounter: Payer: Self-pay | Admitting: Nurse Practitioner

## 2023-03-04 ENCOUNTER — Ambulatory Visit (INDEPENDENT_AMBULATORY_CARE_PROVIDER_SITE_OTHER): Payer: 59 | Admitting: Nurse Practitioner

## 2023-03-04 VITALS — BP 90/60 | HR 100 | Temp 97.7°F | Resp 16 | Ht 63.0 in | Wt 102.1 lb

## 2023-03-04 DIAGNOSIS — F1090 Alcohol use, unspecified, uncomplicated: Secondary | ICD-10-CM | POA: Diagnosis not present

## 2023-03-04 DIAGNOSIS — R636 Underweight: Secondary | ICD-10-CM | POA: Diagnosis not present

## 2023-03-04 DIAGNOSIS — K219 Gastro-esophageal reflux disease without esophagitis: Secondary | ICD-10-CM

## 2023-03-04 LAB — COMPREHENSIVE METABOLIC PANEL
ALT: 17 U/L (ref 0–35)
AST: 23 U/L (ref 0–37)
Albumin: 4.5 g/dL (ref 3.5–5.2)
Alkaline Phosphatase: 104 U/L (ref 39–117)
BUN: 16 mg/dL (ref 6–23)
CO2: 32 mEq/L (ref 19–32)
Calcium: 10.5 mg/dL (ref 8.4–10.5)
Chloride: 93 mEq/L — ABNORMAL LOW (ref 96–112)
Creatinine, Ser: 1.17 mg/dL (ref 0.40–1.20)
GFR: 51.1 mL/min — ABNORMAL LOW (ref >60.00)
Glucose, Bld: 70 mg/dL (ref 70–99)
Potassium: 3.1 mEq/L — ABNORMAL LOW (ref 3.5–5.1)
Sodium: 138 mEq/L (ref 135–145)
Total Bilirubin: 0.3 mg/dL (ref 0.2–1.2)
Total Protein: 7.5 g/dL (ref 6.0–8.3)

## 2023-03-04 LAB — CBC
HCT: 41.7 % (ref 36.0–46.0)
Hemoglobin: 14.2 g/dL (ref 12.0–15.0)
MCHC: 34.1 g/dL (ref 30.0–36.0)
MCV: 92.1 fl (ref 78.0–100.0)
Platelets: 345 10*3/uL (ref 150.0–400.0)
RBC: 4.53 Mil/uL (ref 3.87–5.11)
RDW: 13.6 % (ref 11.5–15.5)
WBC: 8.6 10*3/uL (ref 4.0–10.5)

## 2023-03-04 LAB — FOLATE: Folate: >23.9 ng/mL (ref >5.9)

## 2023-03-04 MED ORDER — PANTOPRAZOLE SODIUM 40 MG PO TBEC
40.0000 mg | DELAYED_RELEASE_TABLET | Freq: Every day | ORAL | 1 refills | Status: DC
Start: 2023-03-04 — End: 2024-01-14

## 2023-03-04 NOTE — Assessment & Plan Note (Addendum)
Patient was concerned with weight gain.  States she is normally around 95 pounds.  Did discuss with patient that she is considered underweight at her current weight and I would be more comfortable with her being around 115 to 120 pounds.  Patient does have a history of being diagnosed with a eating disorder.  Encourage patient to gain weight foods versus losing weight

## 2023-03-04 NOTE — Telephone Encounter (Signed)
Thank you for this message.  Patient mentioned that she has been having issues with alcohol use and informed me of her tremors when not drinking during her last encounter with me.  She had informed me that she has been given Valium in the past to help with her tremors when withdrawing from alcohol.  Unfortunately, treating someone with active withdrawal symptoms from alcohol is not something that is routinely done at our facility due to patient not being in a controlled setting when taking the medication.  I would instruct patient to go to [facility] for alcohol detox.

## 2023-03-04 NOTE — Telephone Encounter (Signed)
See message for Carla Martinez (eddy) and let patient know

## 2023-03-04 NOTE — Assessment & Plan Note (Signed)
History of the same had to go to rehab.  Currently drinking 3 bottles of personal wine a day these are the smaller ones not a 750 mL bottle.  She is interested in cutting back but is getting the shakes.  I did reach out to patient's behavioral health provider to see if they can a patient in doing outpatient at home withdrawal or if she needs to go to rehab

## 2023-03-04 NOTE — Patient Instructions (Signed)
Nice to see you today I will be in touch with the labs once I have reviewed them Follow up with me in 1 month, sooner if you need me

## 2023-03-04 NOTE — Telephone Encounter (Signed)
Carla Martinez,  I saw [place] today and she mentioned about her alcohol use and her desire to stop. She states that she will get shakes when she stops using. She was looking for medication to help come off the alcohol (I assume a benzo). Not something I do routinely. Is that something you are comfortable to do?  Thanks, [employer]

## 2023-03-04 NOTE — Assessment & Plan Note (Signed)
Patient currently maintained on Protonix 40 mg daily.  Needs repeat.  Refill provided today.  Will medication symptoms well-controlled

## 2023-03-04 NOTE — Telephone Encounter (Signed)
Left message to return call to our office.  

## 2023-03-04 NOTE — Progress Notes (Signed)
Acute Office Visit  Subjective:     Patient ID: Carla Martinez, female    DOB: [birth year], 62 y.o.   MRN: [id number]  Chief Complaint  Patient presents with   Weight Check   Bloated    HPI Patient is in today for weight gain and bloated  Weight: states that she is not eating hardly anything and she is continued to gain weight. States that her daughter passed awawy and her liver was large. States that she did drink alcoho.. States that she has been drinking some. States that she has been cutting back on the alcohol  States that she is drinking 3 small bottles of wine. States that she was drinking constanlty for 3 or 4 days . States that her last drink was 10pm and she did take zzz quill this am to level her out.  States that she has been bloating more. States that she was seen by Dr. Allegra Lai and had a colonosocpy and endoscopy. States that it has been going on for 6 months and getting worse. Does feel like she is more gassy and constipated. States that she is on linzess but does not feel that it is helping. States that she has been out of the medication for a week. States that it controls her heart burn. States that she does take a probiotic and Pro vitalize. States that she has had a crampy pain and diarrhea yesterdya. Prior to that she took a laxative a couple nights    Review of Systems  Constitutional:  Positive for weight loss. Negative for chills and fever.  Gastrointestinal:  Positive for constipation and heartburn. Negative for abdominal pain, blood in stool, nausea and vomiting.        Objective:    BP 90/60   Pulse 100   Temp 97.7 F (36.5 C)   Resp 16   Ht 5\' 3"  (1.6 m)   Wt 102 lb 2 oz (46.3 kg) Comment: Pt refused to weigh at office. 102lbs 2oz is home weight  SpO2 94%   BMI 18.09 kg/m  BP Readings from Last 3 Encounters:  03/04/23 90/60  02/19/23 120/71  02/16/23 112/70   Wt Readings from Last 3 Encounters:  03/04/23 102 lb 2 oz (46.3 kg)  02/19/23  104 lb (47.2 kg)  02/16/23 104 lb (47.2 kg)      Physical Exam Vitals and nursing note reviewed.  Constitutional:      Appearance: Normal appearance.  Cardiovascular:     Rate and Rhythm: Normal rate and regular rhythm.     Heart sounds: Normal heart sounds.  Pulmonary:     Effort: Pulmonary effort is normal.     Breath sounds: Normal breath sounds.  Abdominal:     General: Bowel sounds are normal. There is no distension.     Palpations: There is no mass.     Tenderness: There is no abdominal tenderness.     Hernia: No hernia is present.  Neurological:     Mental Status: She is alert.     No results found for any visits on 03/04/23.      Assessment & Plan:   Problem List Items Addressed This Visit       Digestive   Chronic GERD    Patient currently maintained on Protonix 40 mg daily.  Needs repeat.  Refill provided today.  Will medication symptoms well-controlled      Relevant Medications   pantoprazole (PROTONIX) 40 MG tablet     Other  Alcohol use disorder - Primary    History of the same had to go to rehab.  Currently drinking 3 bottles of personal wine a day these are the smaller ones not a 750 mL bottle.  She is interested in cutting back but is getting the shakes.  I did reach out to patient's behavioral health provider to see if they can a patient in doing outpatient at home withdrawal or if she needs to go to rehab      Relevant Orders   CBC   Comprehensive metabolic panel   Vitamin B1   Folate   Underweight    Patient was concerned with weight gain.  States she is normally around 95 pounds.  Did discuss with patient that she is considered underweight at her current weight and I would be more comfortable with her being around 115 to 120 pounds.  Patient does have a history of being diagnosed with a eating disorder.  Encourage patient to gain weight foods versus losing weight      Relevant Orders   CBC   Comprehensive metabolic panel    Meds  ordered this encounter  Medications   pantoprazole (PROTONIX) 40 MG tablet    Sig: Take 1 tablet (40 mg total) by mouth daily.    Dispense:  90 tablet    Refill:  1    Return in about 4 weeks (around 04/01/2023) for weight recheck .  Audria Nine, NP

## 2023-03-05 ENCOUNTER — Other Ambulatory Visit: Payer: Self-pay | Admitting: Nurse Practitioner

## 2023-03-05 DIAGNOSIS — E876 Hypokalemia: Secondary | ICD-10-CM

## 2023-03-05 MED ORDER — POTASSIUM CHLORIDE CRYS ER 20 MEQ PO TBCR
40.0000 meq | EXTENDED_RELEASE_TABLET | Freq: Once | ORAL | 0 refills | Status: DC
Start: 2023-03-05 — End: 2024-01-22

## 2023-03-05 NOTE — Telephone Encounter (Signed)
Patient returned call from 03/04/2023,would like a call back

## 2023-03-05 NOTE — Telephone Encounter (Signed)
Called and informed pt of this information. 

## 2023-03-07 LAB — VITAMIN B1: Vitamin B1 (Thiamine): 62 nmol/L — ABNORMAL HIGH (ref 8–30)

## 2023-03-13 ENCOUNTER — Telehealth: Payer: Self-pay

## 2023-03-13 NOTE — Telephone Encounter (Addendum)
Per pharmacy Pantoprazole requires PA.  PA started via [facility] Key: [id number]  Per CMM: QTY LIMIT EXCD PA REQ`D CALL [phone] MAX QTY OF 90.000 IN 365 DAYSQUANTITY REMAINING .000NEXT FILL DT [id number], LAST FILL [id number] @[facility]  PHARMAC,PH# [id number] HELP DESK [phone]  PA submitted, pending

## 2023-03-18 ENCOUNTER — Ambulatory Visit: Payer: 59 | Attending: Cardiology

## 2023-03-18 DIAGNOSIS — I471 Supraventricular tachycardia, unspecified: Secondary | ICD-10-CM | POA: Diagnosis not present

## 2023-03-18 DIAGNOSIS — Z01810 Encounter for preprocedural cardiovascular examination: Secondary | ICD-10-CM

## 2023-03-18 DIAGNOSIS — I5181 Takotsubo syndrome: Secondary | ICD-10-CM

## 2023-03-18 LAB — ECHOCARDIOGRAM COMPLETE
AR max vel: 2.45 cm2
AV Area VTI: 2.5 cm2
AV Area mean vel: 2.53 cm2
AV Mean grad: 2 mmHg
AV Peak grad: 4.2 mmHg
Ao pk vel: 1.03 m/s
Area-P 1/2: 2.95 cm2
Calc EF: 48.9 %
S' Lateral: 2.8 cm
Single Plane A2C EF: 38.7 %
Single Plane A4C EF: 55.7 %

## 2023-03-22 NOTE — Telephone Encounter (Signed)
Pharmacy Patient Advocate Encounter  Prior Authorization for Pantoprazole Sodium 40MG  dr tablets has been APPROVED by [facility] from 03/13/2023 to 03/11/2024.   PA #  [id number]  Copay is $0.00

## 2023-03-23 ENCOUNTER — Other Ambulatory Visit: Payer: Self-pay

## 2023-03-23 DIAGNOSIS — I509 Heart failure, unspecified: Secondary | ICD-10-CM

## 2023-03-23 MED ORDER — LOSARTAN POTASSIUM 25 MG PO TABS: 25.0000 mg | ORAL_TABLET | Freq: Every day | ORAL | 3 refills | Status: DC

## 2023-03-27 ENCOUNTER — Telehealth: Payer: Self-pay

## 2023-04-01 ENCOUNTER — Telehealth: Payer: Self-pay

## 2023-04-01 NOTE — Telephone Encounter (Signed)
Patient was referred to Dr. Gala Romney by Dr. Azucena Cecil. Left voicemail for patient to contact HF clinic to schedule appointment.

## 2023-04-02 ENCOUNTER — Encounter: Payer: Self-pay | Admitting: Cardiology

## 2023-04-02 ENCOUNTER — Ambulatory Visit: Payer: Self-pay | Attending: Cardiology | Admitting: Cardiology

## 2023-04-08 ENCOUNTER — Telehealth (HOSPITAL_COMMUNITY): Payer: Self-pay | Admitting: Physician Assistant

## 2023-04-08 ENCOUNTER — Encounter (HOSPITAL_COMMUNITY): Payer: Self-pay

## 2023-04-09 ENCOUNTER — Ambulatory Visit: Payer: 59 | Admitting: Nurse Practitioner

## 2023-04-15 ENCOUNTER — Other Ambulatory Visit (HOSPITAL_COMMUNITY): Payer: Self-pay | Admitting: Physician Assistant

## 2023-04-15 DIAGNOSIS — F411 Generalized anxiety disorder: Secondary | ICD-10-CM

## 2023-04-15 DIAGNOSIS — G4700 Insomnia, unspecified: Secondary | ICD-10-CM

## 2023-04-15 DIAGNOSIS — F339 Major depressive disorder, recurrent, unspecified: Secondary | ICD-10-CM

## 2023-04-21 ENCOUNTER — Other Ambulatory Visit (HOSPITAL_COMMUNITY): Payer: Self-pay | Admitting: Physician Assistant

## 2023-04-21 DIAGNOSIS — F339 Major depressive disorder, recurrent, unspecified: Secondary | ICD-10-CM

## 2023-04-22 ENCOUNTER — Telehealth (HOSPITAL_COMMUNITY): Payer: Self-pay | Admitting: *Deleted

## 2023-04-22 NOTE — Telephone Encounter (Signed)
Fax received for refill of Trazodone 100mg . No appointment scheduled. Left message for pharmacy to have patient call to schedule. Message sent to MD for review.

## 2023-04-24 NOTE — Telephone Encounter (Signed)
Message acknowledged and reviewed.

## 2023-05-01 NOTE — Telephone Encounter (Signed)
3rd attempt. LVM to return call. Attempted to call pt to schedule with Dr. Gala Romney per referral.

## 2023-05-06 ENCOUNTER — Other Ambulatory Visit (HOSPITAL_COMMUNITY): Payer: Self-pay | Admitting: Physician Assistant

## 2023-05-06 DIAGNOSIS — G4700 Insomnia, unspecified: Secondary | ICD-10-CM

## 2023-05-16 ENCOUNTER — Other Ambulatory Visit (HOSPITAL_COMMUNITY): Payer: Self-pay | Admitting: Physician Assistant

## 2023-05-16 DIAGNOSIS — F339 Major depressive disorder, recurrent, unspecified: Secondary | ICD-10-CM

## 2023-05-16 DIAGNOSIS — F411 Generalized anxiety disorder: Secondary | ICD-10-CM

## 2023-05-22 ENCOUNTER — Telehealth (HOSPITAL_COMMUNITY): Payer: Self-pay | Admitting: Psychiatry

## 2023-05-23 ENCOUNTER — Other Ambulatory Visit (HOSPITAL_COMMUNITY): Payer: Self-pay | Admitting: Physician Assistant

## 2023-05-23 DIAGNOSIS — F411 Generalized anxiety disorder: Secondary | ICD-10-CM

## 2023-05-23 DIAGNOSIS — F339 Major depressive disorder, recurrent, unspecified: Secondary | ICD-10-CM

## 2023-05-23 DIAGNOSIS — G4700 Insomnia, unspecified: Secondary | ICD-10-CM

## 2023-05-25 ENCOUNTER — Other Ambulatory Visit (HOSPITAL_COMMUNITY): Payer: Self-pay | Admitting: Psychiatry

## 2023-05-25 DIAGNOSIS — F411 Generalized anxiety disorder: Secondary | ICD-10-CM

## 2023-05-25 DIAGNOSIS — F339 Major depressive disorder, recurrent, unspecified: Secondary | ICD-10-CM

## 2023-05-25 DIAGNOSIS — G4700 Insomnia, unspecified: Secondary | ICD-10-CM

## 2023-05-25 DIAGNOSIS — F1090 Alcohol use, unspecified, uncomplicated: Secondary | ICD-10-CM

## 2023-05-25 MED ORDER — GABAPENTIN 300 MG PO CAPS
300.0000 mg | ORAL_CAPSULE | Freq: Three times a day (TID) | ORAL | 3 refills | Status: DC
Start: 2023-05-25 — End: 2023-07-14

## 2023-05-25 MED ORDER — FLUOXETINE HCL 20 MG PO CAPS
20.0000 mg | ORAL_CAPSULE | Freq: Every day | ORAL | 3 refills | Status: DC
Start: 2023-05-25 — End: 2023-07-14

## 2023-05-25 MED ORDER — QUETIAPINE FUMARATE 300 MG PO TABS
300.0000 mg | ORAL_TABLET | Freq: Every day | ORAL | 3 refills | Status: DC
Start: 2023-05-25 — End: 2023-07-14

## 2023-05-25 MED ORDER — TRAZODONE HCL 100 MG PO TABS
100.0000 mg | ORAL_TABLET | Freq: Every day | ORAL | 3 refills | Status: DC
Start: 2023-05-25 — End: 2023-07-14

## 2023-05-25 NOTE — Telephone Encounter (Signed)
Medication sent to preferred pharmacy

## 2023-06-03 NOTE — Telephone Encounter (Signed)
Message acknowledged and reviewed.

## 2023-06-17 ENCOUNTER — Other Ambulatory Visit (HOSPITAL_COMMUNITY): Payer: Self-pay | Admitting: Physician Assistant

## 2023-06-17 DIAGNOSIS — F411 Generalized anxiety disorder: Secondary | ICD-10-CM

## 2023-06-23 ENCOUNTER — Telehealth (HOSPITAL_COMMUNITY): Payer: Commercial Managed Care - HMO | Admitting: Physician Assistant

## 2023-06-23 ENCOUNTER — Encounter (HOSPITAL_COMMUNITY): Payer: Self-pay

## 2023-06-24 ENCOUNTER — Emergency Department: Payer: Commercial Managed Care - HMO

## 2023-06-24 ENCOUNTER — Other Ambulatory Visit: Payer: Self-pay

## 2023-06-24 ENCOUNTER — Emergency Department
Admission: EM | Admit: 2023-06-24 | Discharge: 2023-06-24 | Disposition: A | Discharge status: Home / Self Care | Payer: Commercial Managed Care - HMO | Attending: Emergency Medicine | Admitting: Emergency Medicine

## 2023-06-24 DIAGNOSIS — F10930 Alcohol use, unspecified with withdrawal, uncomplicated: Secondary | ICD-10-CM

## 2023-06-24 DIAGNOSIS — X58XXXS Exposure to other specified factors, sequela: Secondary | ICD-10-CM | POA: Diagnosis not present

## 2023-06-24 DIAGNOSIS — Y908 Blood alcohol level of 240 mg/100 ml or more: Secondary | ICD-10-CM | POA: Insufficient documentation

## 2023-06-24 DIAGNOSIS — E876 Hypokalemia: Secondary | ICD-10-CM | POA: Insufficient documentation

## 2023-06-24 DIAGNOSIS — F10239 Alcohol dependence with withdrawal, unspecified: Secondary | ICD-10-CM | POA: Diagnosis present

## 2023-06-24 DIAGNOSIS — S92512S Displaced fracture of proximal phalanx of left lesser toe(s), sequela: Secondary | ICD-10-CM | POA: Diagnosis not present

## 2023-06-24 DIAGNOSIS — I509 Heart failure, unspecified: Secondary | ICD-10-CM | POA: Insufficient documentation

## 2023-06-24 DIAGNOSIS — S92502S Displaced unspecified fracture of left lesser toe(s), sequela: Secondary | ICD-10-CM

## 2023-06-24 LAB — COMPREHENSIVE METABOLIC PANEL
ALT: 20 U/L (ref 0–44)
AST: 28 U/L (ref 15–41)
Albumin: 4 g/dL (ref 3.5–5.0)
Alkaline Phosphatase: 93 U/L (ref 38–126)
Anion gap: 13 (ref 5–15)
BUN: 9 mg/dL (ref 6–20)
CO2: 24 mmol/L (ref 22–32)
Calcium: 8.7 mg/dL — ABNORMAL LOW (ref 8.9–10.3)
Chloride: 101 mmol/L (ref 98–111)
Creatinine, Ser: 0.71 mg/dL (ref 0.44–1.00)
GFR, Estimated: >60 mL/min (ref >60)
Glucose, Bld: 97 mg/dL (ref 70–99)
Potassium: 3.2 mmol/L — ABNORMAL LOW (ref 3.5–5.1)
Sodium: 138 mmol/L (ref 135–145)
Total Bilirubin: 0.3 mg/dL (ref 0.3–1.2)
Total Protein: 7.1 g/dL (ref 6.5–8.1)

## 2023-06-24 LAB — URINE DRUG SCREEN, QUALITATIVE (ARMC ONLY)
Amphetamines, Ur Screen: NOT DETECTED
Barbiturates, Ur Screen: NOT DETECTED
Benzodiazepine, Ur Scrn: NOT DETECTED
Cannabinoid 50 Ng, Ur ~~LOC~~: NOT DETECTED
Cocaine Metabolite,Ur ~~LOC~~: NOT DETECTED
MDMA (Ecstasy)Ur Screen: NOT DETECTED
Methadone Scn, Ur: NOT DETECTED
Opiate, Ur Screen: NOT DETECTED
Phencyclidine (PCP) Ur S: NOT DETECTED
Tricyclic, Ur Screen: POSITIVE — AB

## 2023-06-24 LAB — SALICYLATE LEVEL: Salicylate Lvl: <7 mg/dL — ABNORMAL LOW (ref 7.0–30.0)

## 2023-06-24 LAB — CBC
HCT: 38.6 % (ref 36.0–46.0)
Hemoglobin: 12.3 g/dL (ref 12.0–15.0)
MCH: 29.7 pg (ref 26.0–34.0)
MCHC: 31.9 g/dL (ref 30.0–36.0)
MCV: 93.2 fL (ref 80.0–100.0)
Platelets: 306 10*3/uL (ref 150–400)
RBC: 4.14 MIL/uL (ref 3.87–5.11)
RDW: 13 % (ref 11.5–15.5)
WBC: 8.6 10*3/uL (ref 4.0–10.5)
nRBC: 0 % (ref 0.0–0.2)

## 2023-06-24 LAB — ACETAMINOPHEN LEVEL: Acetaminophen (Tylenol), Serum: <10 ug/mL — ABNORMAL LOW (ref 10–30)

## 2023-06-24 LAB — ETHANOL: Alcohol, Ethyl (B): 263 mg/dL — ABNORMAL HIGH (ref <10)

## 2023-06-24 MED ORDER — KETOROLAC TROMETHAMINE 15 MG/ML IJ SOLN
15.0000 mg | Freq: Once | INTRAMUSCULAR | Status: AC
  Administered 2023-06-24: 15 mg via INTRAVENOUS
  Filled 2023-06-24: qty 1

## 2023-06-24 MED ORDER — CHLORDIAZEPOXIDE HCL 25 MG PO CAPS: ORAL_CAPSULE | ORAL | 0 refills | Status: AC

## 2023-06-24 NOTE — ED Notes (Signed)
Pt reports "I've been drinking a lot", asked pt when last drink was she reports early this morning. Pt also reporting pain to left foot/ankle and unable to bear weight on it, unsure of how she hurt it.

## 2023-06-24 NOTE — ED Notes (Signed)
Pt taken to XR.  

## 2023-06-24 NOTE — Discharge Instructions (Addendum)
You were seen in the Emergency Department today for alcohol detox.  I have started you on a medication that I would like you to take as prescribed over the next 6 days.  Please return to emergency department if you have any worsening symptoms or struggling with withdrawal.  Resource guides have been attached to for outpatient counseling and substance abuse assistance.  Also, please follow-up with podiatry for ongoing evaluation of your left foot injury.  Their contact is in your paperwork.

## 2023-06-24 NOTE — ED Provider Notes (Signed)
[facility] Provider Note    Event Date/Time   First MD Initiated Contact with Patient 06/24/23 1120     (approximate)   History   detox   HPI Carla Martinez is a 62 y.o. female with alcohol use disorder, chronic hypotension, CHF, MDD, chronic tobacco use presenting today for detox.  Patient states she has been drinking a significant amount of alcohol over the past 2 months.  Denies any obvious stressor but notes drinking daily.  She is unable to describe how much but notes drinking wine and liquor.  Denies smoking use.  Prior history of alcohol detox.  Separately, she notes a left foot injury that she is unsure how it happened.  Does note bruising to her left foot.     Physical Exam   Triage Vital Signs: ED Triage Vitals  Encounter Vitals Group     BP 06/24/23 1116 107/70     Systolic BP Percentile --      Diastolic BP Percentile --      Pulse Rate 06/24/23 1116 91     Resp 06/24/23 1116 18     Temp 06/24/23 1116 98.4 F (36.9 C)     Temp Source 06/24/23 1116 Oral     SpO2 06/24/23 1116 95 %     Weight 06/24/23 1119 100 lb (45.4 kg)     Height 06/24/23 1119 5' 3.5" (1.613 m)     Head Circumference --      Peak Flow --      Pain Score 06/24/23 1118 5     Pain Loc --      Pain Education --      Exclude from Growth Chart --     Most recent vital signs: Vitals:   06/24/23 1116  BP: 107/70  Pulse: 91  Resp: 18  Temp: 98.4 F (36.9 C)  SpO2: 95%   I have reviewed the vital signs. General:  Awake, alert, no acute distress. Head:  Normocephalic, Atraumatic. EENT:  PERRL, EOMI, Oral mucosa pink and moist, Neck is supple. Cardiovascular: Regular rate, 2+ distal pulses. Respiratory:  Normal respiratory effort, symmetrical expansion, no distress.   Extremities:  Moving all four extremities through full ROM without pain.  Bruising noted on top of left foot throughout with tenderness to palpation.  No bruising or tenderness to the left  ankle. Neuro:  Alert and oriented.  Interacting appropriately.   Skin:  Warm, dry, no rash.   Psych: Appropriate affect.     ED Results / Procedures / Treatments   Labs (all labs ordered are listed, but only abnormal results are displayed) Labs Reviewed  COMPREHENSIVE METABOLIC PANEL - Abnormal; Notable for the following components:      Result Value   Potassium 3.2 (*)    Calcium 8.7 (*)    All other components within normal limits  ETHANOL - Abnormal; Notable for the following components:   Alcohol, Ethyl (B) 263 (*)    All other components within normal limits  SALICYLATE LEVEL - Abnormal; Notable for the following components:   Salicylate Lvl <7.0 (*)    All other components within normal limits  ACETAMINOPHEN LEVEL - Abnormal; Notable for the following components:   Acetaminophen (Tylenol), Serum <10 (*)    All other components within normal limits  URINE DRUG SCREEN, QUALITATIVE ([facility] ONLY) - Abnormal; Notable for the following components:   Tricyclic, Ur Screen POSITIVE (*)    All other components within normal limits  CBC  EKG    RADIOLOGY Independently interpreted left foot x-ray with note of subacute versus old fourth proximal phalanx fracture.   PROCEDURES:  Critical Care performed: No  Procedures   MEDICATIONS ORDERED IN ED: Medications  ketorolac (TORADOL) 15 MG/ML injection 15 mg (15 mg Intravenous Given 06/24/23 1243)     IMPRESSION / MDM / ASSESSMENT AND PLAN / ED COURSE  I reviewed the triage vital signs and the nursing notes.                              Differential diagnosis includes, but is not limited to, alcohol detox, metatarsal fracture, midfoot fracture.  Patient's presentation is most consistent with exacerbation of chronic illness.  Patient is a 62 year old female presenting today to discuss alcohol detox.  Alcohol level elevated to 63.  Laboratory workup otherwise reassuring.  Separately, x-ray of left foot shows no acute  fracture with possible subacute fracture of the fourth proximal phalanx.  Patient will be provided with a fracture shoe and have podiatry follow-up.  Discussed alcohol detox with the patient.  She prefers not to be admitted to a facility or to the hospital.  She states that she would like to detox at home.  I discussed starting her on a Librium taper which patient is agreeable with.  She was also given resources for outpatient substance use disorder aid.  Patient was happy with this plan and preferred to go home at this time.  Patient was clinically sober and safe for discharge and given strict return precautions for any worsening withdrawal symptoms.  The patient is on the cardiac monitor to evaluate for evidence of arrhythmia and/or significant heart rate changes. Clinical Course as of 06/24/23 1330  Wed Jun 24, 2023  1154 Alcohol, Ethyl (B)(!): 263 [DW]  1155 Comprehensive metabolic panel(!) Mild hypokalemia otherwise unremarkable [DW]  1304 X-ray of left foot shows no obvious acute fracture but possible subacute fracture of the fourth metatarsal.  Will provide patient with walking shoe.  She has crutches at home that she will use.  Follow-up with orthopedics. [DW]  1305 Patient appears clinically sober on reassessment at this time.  Rediscussed what she would like from a detox standpoint.  Patient does not want to be admitted to the hospital or rehab facility.  Discussed initiation of Librium taper and giving her outpatient resources which patient prefers.  Safe for discharge at this time. [DW]    Clinical Course User Index [DW] Janith Lima, MD     FINAL CLINICAL IMPRESSION(S) / ED DIAGNOSES   Final diagnoses:  Alcohol withdrawal syndrome without complication (HCC)  Closed fracture of phalanx of left fourth toe, sequela     Rx / DC Orders   ED Discharge Orders          Ordered    chlordiazePOXIDE (LIBRIUM) 25 MG capsule  Multiple Frequencies        06/24/23 1313    Ambulatory  referral to Podiatry        06/24/23 1313             Note:  This document was prepared using Dragon voice recognition software and may include unintentional dictation errors.   Janith Lima, MD 06/24/23 [phone]

## 2023-06-24 NOTE — ED Notes (Signed)
Pt. returned from XR. 

## 2023-06-24 NOTE — ED Triage Notes (Signed)
Pt to ED for ETOH detox. Last drink this am. States has been drinking for a few days. Reports pain to left foot, unsure if injury

## 2023-06-25 ENCOUNTER — Ambulatory Visit (INDEPENDENT_AMBULATORY_CARE_PROVIDER_SITE_OTHER)
Admission: RE | Admit: 2023-06-25 | Discharge: 2023-06-25 | Disposition: A | Discharge status: Home / Self Care | Payer: 59 | Source: Ambulatory Visit | Attending: Internal Medicine | Admitting: Internal Medicine

## 2023-06-25 ENCOUNTER — Encounter: Payer: Self-pay | Admitting: Internal Medicine

## 2023-06-25 ENCOUNTER — Ambulatory Visit (INDEPENDENT_AMBULATORY_CARE_PROVIDER_SITE_OTHER): Payer: 59 | Admitting: Internal Medicine

## 2023-06-25 VITALS — BP 90/60 | HR 97 | Temp 97.5°F | Ht 63.0 in | Wt 98.0 lb

## 2023-06-25 DIAGNOSIS — S42032A Displaced fracture of lateral end of left clavicle, initial encounter for closed fracture: Secondary | ICD-10-CM | POA: Diagnosis not present

## 2023-06-25 DIAGNOSIS — M25512 Pain in left shoulder: Secondary | ICD-10-CM

## 2023-06-25 NOTE — Assessment & Plan Note (Signed)
Fell last night Findings worrisome for humeral fracture--will check x-ray

## 2023-06-25 NOTE — Assessment & Plan Note (Signed)
Shoulder x-rays not completed--but humerus looks okay Will put her in a sling Tylenol for pain She needs to get in with her orthopedist in the next couple of days to make sure they don't think anything else is needed
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
## 2023-06-29 ENCOUNTER — Telehealth: Payer: Self-pay | Admitting: Nurse Practitioner

## 2023-06-29 NOTE — Telephone Encounter (Signed)
Librium is a short term medication and I do not do those types of detox medications outpatient.

## 2023-06-29 NOTE — Telephone Encounter (Signed)
Patient called in and stated that she was discharged from the hospital with medication. She is needing a refill of chlordiazePOXIDE (LIBRIUM) 25 MG capsule and Oxycodone-Acetaminophen (Percocet) (which I don't see on her list). She would like this to be sent over to [facility] - [place], [place] - [address]. Thank you!

## 2023-06-30 NOTE — Telephone Encounter (Signed)
Left message to return call to our office.  

## 2023-07-01 ENCOUNTER — Ambulatory Visit (INDEPENDENT_AMBULATORY_CARE_PROVIDER_SITE_OTHER): Payer: Commercial Managed Care - HMO | Admitting: Family Medicine

## 2023-07-01 ENCOUNTER — Telehealth: Payer: Self-pay | Admitting: Nurse Practitioner

## 2023-07-01 ENCOUNTER — Encounter: Payer: Self-pay | Admitting: Family Medicine

## 2023-07-01 VITALS — BP 102/62 | HR 71 | Temp 97.8°F | Resp 16 | Ht 63.0 in | Wt 100.0 lb

## 2023-07-01 DIAGNOSIS — R8290 Unspecified abnormal findings in urine: Secondary | ICD-10-CM

## 2023-07-01 DIAGNOSIS — N3001 Acute cystitis with hematuria: Secondary | ICD-10-CM | POA: Diagnosis not present

## 2023-07-01 DIAGNOSIS — R300 Dysuria: Secondary | ICD-10-CM

## 2023-07-01 DIAGNOSIS — N3000 Acute cystitis without hematuria: Secondary | ICD-10-CM | POA: Insufficient documentation

## 2023-07-01 DIAGNOSIS — S42009A Fracture of unspecified part of unspecified clavicle, initial encounter for closed fracture: Secondary | ICD-10-CM | POA: Insufficient documentation

## 2023-07-01 DIAGNOSIS — S42002S Fracture of unspecified part of left clavicle, sequela: Secondary | ICD-10-CM

## 2023-07-01 DIAGNOSIS — F1090 Alcohol use, unspecified, uncomplicated: Secondary | ICD-10-CM

## 2023-07-01 LAB — POC URINALSYSI DIPSTICK (AUTOMATED)
Bilirubin, UA: NEGATIVE
Glucose, UA: NEGATIVE
Ketones, UA: NEGATIVE
Nitrite, UA: NEGATIVE
Protein, UA: POSITIVE — AB
Spec Grav, UA: 1.02 (ref 1.010–1.025)
Urobilinogen, UA: NEGATIVE U/dL — AB
pH, UA: 6 (ref 5.0–8.0)

## 2023-07-01 MED ORDER — CEPHALEXIN 500 MG PO CAPS: 500.0000 mg | ORAL_CAPSULE | Freq: Two times a day (BID) | ORAL | 0 refills | Status: DC

## 2023-07-01 NOTE — Assessment & Plan Note (Signed)
With some visible blood one time  Symptoms about a week/ dysuria and frequency and urgency Urinalysis positive for rbc and wbc Culture pending Sent keflex to pharmacy Encouraged strongly to increase fluid intake/ water especially (avoid artificial sweeteners if they cause more urinary pain) Update if not starting to improve in a week or if worsening  Call back and Er precautions noted in detail today

## 2023-07-01 NOTE — Assessment & Plan Note (Addendum)
Pt was seen at [facility]  Brief note from them reviewed  Prescription percocet at that time Asked for refill- and instructed that she need to call them for that as well as follow up appointment She voiced understanding
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
## 2023-07-01 NOTE — Patient Instructions (Addendum)
Start the keflex as directed for uti  Drink more water if you can  Avoid artificial sweeteners if you can   We will contact you with the urine culture result when it returns   If symptoms worsen in the meantime   Call orthopedic office about pain control and follow up   Call your psychiatrist and let them know what is going on as well
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
## 2023-07-01 NOTE — Telephone Encounter (Signed)
Prescription Request  07/01/2023  LOV: 03/04/2023  What is the name of the medication or equipment? oxyCODONE-acetaminophen (PERCOCET/ROXICET) 5-325 MG tablet  Chlordiazepoxide 25 mg   Have you contacted your pharmacy to request a refill? No   Which pharmacy would you like this sent to?   [id number] - [place], [place] - [address] [place] [address] Phone: [phone] Fax: [phone]    Patient notified that their request is being sent to the clinical staff for review and that they should receive a response within 2 business days.   Please advise at Mobile [phone] (mobile)  Patient saw Dr. Alphonsus Sias  and Dr. Milinda Antis, was told by the hospital that she would have to reach out to pcp to fill these medications. Second med is not on patient's med list and patient was made aware of this, advised that I would send to pcp to ask for refill.

## 2023-07-02 ENCOUNTER — Other Ambulatory Visit: Payer: Self-pay | Admitting: Orthopaedic Surgery

## 2023-07-02 DIAGNOSIS — S99922A Unspecified injury of left foot, initial encounter: Secondary | ICD-10-CM

## 2023-07-02 NOTE — Telephone Encounter (Signed)
No refill. She was told this by Dr. Milinda Antis yesterday that she needs to call Ortho for refills and to follow up for further management

## 2023-07-02 NOTE — Telephone Encounter (Signed)
Called patient I have reviewed information. She is aware you will not prescribe Librium. She wanted to know if she can get refill on oxycodone. She has taken last one last night.

## 2023-07-03 ENCOUNTER — Ambulatory Visit: Payer: 59

## 2023-07-03 NOTE — Telephone Encounter (Signed)
Left message to return call to our office.  

## 2023-07-03 NOTE — Telephone Encounter (Signed)
Pt called back, told pt [facility]'s response. Pt had no questions/concerns. Call back # [phone]

## 2023-07-03 NOTE — Telephone Encounter (Signed)
She has requested these medications and they have been dealt with in separate messages

## 2023-07-04 ENCOUNTER — Other Ambulatory Visit (HOSPITAL_COMMUNITY): Payer: Self-pay | Admitting: Physician Assistant

## 2023-07-04 DIAGNOSIS — F411 Generalized anxiety disorder: Secondary | ICD-10-CM

## 2023-07-04 LAB — URINE CULTURE
MICRO NUMBER:: 15452116
SPECIMEN QUALITY:: ADEQUATE

## 2023-07-06 ENCOUNTER — Ambulatory Visit: Payer: 59 | Admitting: Podiatry

## 2023-07-14 ENCOUNTER — Encounter (HOSPITAL_COMMUNITY): Payer: Self-pay | Admitting: Physician Assistant

## 2023-07-14 ENCOUNTER — Ambulatory Visit (INDEPENDENT_AMBULATORY_CARE_PROVIDER_SITE_OTHER): Payer: Commercial Managed Care - HMO | Admitting: Physician Assistant

## 2023-07-14 VITALS — BP 142/75 | HR 92 | Ht 63.0 in

## 2023-07-14 DIAGNOSIS — F339 Major depressive disorder, recurrent, unspecified: Secondary | ICD-10-CM | POA: Diagnosis not present

## 2023-07-14 DIAGNOSIS — Z634 Disappearance and death of family member: Secondary | ICD-10-CM

## 2023-07-14 DIAGNOSIS — G4700 Insomnia, unspecified: Secondary | ICD-10-CM

## 2023-07-14 DIAGNOSIS — F411 Generalized anxiety disorder: Secondary | ICD-10-CM | POA: Diagnosis not present

## 2023-07-14 DIAGNOSIS — F1090 Alcohol use, unspecified, uncomplicated: Secondary | ICD-10-CM

## 2023-07-14 MED ORDER — HYDROXYZINE HCL 50 MG PO TABS: 50.0000 mg | ORAL_TABLET | Freq: Three times a day (TID) | ORAL | 1 refills | Status: DC | PRN

## 2023-07-14 MED ORDER — QUETIAPINE FUMARATE 400 MG PO TABS
400.0000 mg | ORAL_TABLET | Freq: Every day | ORAL | 1 refills | Status: DC
Start: 2023-07-14 — End: 2023-08-27

## 2023-07-14 MED ORDER — TRAZODONE HCL 150 MG PO TABS
150.0000 mg | ORAL_TABLET | Freq: Every day | ORAL | 1 refills | Status: DC
Start: 2023-07-14 — End: 2023-08-27

## 2023-07-14 MED ORDER — GABAPENTIN 300 MG PO CAPS
300.0000 mg | ORAL_CAPSULE | Freq: Three times a day (TID) | ORAL | 1 refills | Status: DC
Start: 2023-07-14 — End: 2023-08-27

## 2023-07-14 MED ORDER — FLUOXETINE HCL 40 MG PO CAPS
40.0000 mg | ORAL_CAPSULE | Freq: Every day | ORAL | 1 refills | Status: DC
Start: 2023-07-14 — End: 2023-11-19
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
## 2023-07-16 ENCOUNTER — Telehealth (HOSPITAL_COMMUNITY): Payer: Self-pay | Admitting: *Deleted

## 2023-07-16 NOTE — Telephone Encounter (Signed)
Patient called asking about her medication Librium being called in to the pharmacy. States they do not have anything yet and she is wondering what she is going to take to help with alcohol withdrawal. States her mother is having surgery on Monday and she has a nonverbal child that she is the caretaker of who needs her at home. She is unable to do a detox program. Asking if Librium is going to be sent to her pharmacy.

## 2023-08-14 ENCOUNTER — Encounter: Payer: Self-pay | Admitting: Nurse Practitioner

## 2023-08-14 ENCOUNTER — Ambulatory Visit (INDEPENDENT_AMBULATORY_CARE_PROVIDER_SITE_OTHER): Payer: Managed Care, Other (non HMO) | Admitting: Nurse Practitioner

## 2023-08-14 ENCOUNTER — Ambulatory Visit (INDEPENDENT_AMBULATORY_CARE_PROVIDER_SITE_OTHER)
Admission: RE | Admit: 2023-08-14 | Discharge: 2023-08-14 | Disposition: A | Discharge status: Home / Self Care | Payer: Managed Care, Other (non HMO) | Source: Ambulatory Visit | Attending: Nurse Practitioner | Admitting: Nurse Practitioner

## 2023-08-14 VITALS — BP 110/60 | HR 94 | Temp 98.3°F | Ht 63.0 in | Wt 96.0 lb

## 2023-08-14 DIAGNOSIS — R251 Tremor, unspecified: Secondary | ICD-10-CM | POA: Diagnosis not present

## 2023-08-14 DIAGNOSIS — F1090 Alcohol use, unspecified, uncomplicated: Secondary | ICD-10-CM

## 2023-08-14 DIAGNOSIS — R0781 Pleurodynia: Secondary | ICD-10-CM

## 2023-08-14 MED ORDER — LOSARTAN POTASSIUM 25 MG PO TABS: 25.0000 mg | ORAL_TABLET | Freq: Every day | ORAL | 0 refills | Status: DC

## 2023-08-14 NOTE — Patient Instructions (Signed)
Nice to see you today I recommend that you follow up with the cardiologist Dr. Azucena Cecil I will be in touch with the labs and xray

## 2023-08-14 NOTE — Assessment & Plan Note (Signed)
Left-sided unsure if she fell.  Tenderness to palpation lung sounds within normal limits.  No bruising or skin abnormalities pending chest x-ray

## 2023-08-14 NOTE — Assessment & Plan Note (Signed)
Patient states she has not drank in a week.  Past the acute withdrawal window encourage abstinence.  Will check basic labs

## 2023-08-14 NOTE — Assessment & Plan Note (Signed)
Likely secondary to alcohol withdrawal.  Patient is outside the window of DTs continue abstinence

## 2023-08-14 NOTE — Progress Notes (Signed)
Acute Office Visit  Subjective:     Patient ID: Carla Martinez, female    DOB: 19-Mar-1961, 62 y.o.   MRN: [id number]  Chief Complaint  Patient presents with   Follow-up    Pt complains of left side rib pain. Pt states it started a few days ago. Pt states it feels like the last time she broke her ribs.    Medication Refill    Losartan      Patient is in today for rib pain  States that she is having pain on the left side that started a few days ago. States that she has broken ribs in the past. She is not sure if she feel or not. Did not loss consciousness. States that she has been drinking more at the time but has been sober for a week and has been using nyquill either. States that if she takes a deep breath she hurts or moves she hurts.  Patient has had a history of alcohol withdrawal in the past. She states that she has had a seizures.  Patient states she has been 1 week without alcohol.  States the first couple days were worse but yesterday and today has been better.  She denies hallucinations and headache.  Does have a little bit of a tremor on intention.  She denies headache but does have some dizziness    Review of Systems  Constitutional:  Negative for chills and fever.  Respiratory:  Positive for shortness of breath. Negative for cough.   Cardiovascular:  Positive for chest pain.  Gastrointestinal:  Positive for constipation. Negative for abdominal pain, diarrhea, nausea and vomiting.       BM this morning with laxatives         Objective:    BP 110/60   Pulse 94   Temp 98.3 F (36.8 C) (Oral)   Ht 5\' 3"  (1.6 m)   Wt 96 lb (43.5 kg) Comment: in wheelchair, per patient  SpO2 95%   BMI 17.01 kg/m  BP Readings from Last 3 Encounters:  08/14/23 110/60  07/01/23 102/62  06/25/23 90/60   Wt Readings from Last 3 Encounters:  08/14/23 96 lb (43.5 kg)  07/01/23 100 lb (45.4 kg)  06/25/23 98 lb (44.5 kg)   SpO2 Readings from Last 3 Encounters:  08/14/23 95%   07/01/23 97%  06/25/23 98%      Physical Exam Vitals and nursing note reviewed.  Constitutional:      Appearance: Normal appearance.  Cardiovascular:     Rate and Rhythm: Normal rate and regular rhythm.     Heart sounds: Normal heart sounds.  Pulmonary:     Effort: Pulmonary effort is normal.     Breath sounds: Normal breath sounds.  Chest:     Chest wall: Tenderness present.  Abdominal:     General: There is no distension.     Palpations: There is no mass.     Tenderness: There is no abdominal tenderness.     Hernia: No hernia is present.  Neurological:     Mental Status: She is alert.     Comments: tremor     No results found for any visits on 08/14/23.      Assessment & Plan:   Problem List Items Addressed This Visit       Other   Tremor    Likely secondary to alcohol withdrawal.  Patient is outside the window of DTs continue abstinence      Relevant Orders  CBC   Comprehensive metabolic panel   Alcohol use disorder    Patient states she has not drank in a week.  Past the acute withdrawal window encourage abstinence.  Will check basic labs      Relevant Orders   CBC   Comprehensive metabolic panel   Vitamin B1   Rib pain - Primary    Left-sided unsure if she fell.  Tenderness to palpation lung sounds within normal limits.  No bruising or skin abnormalities pending chest x-ray      Relevant Orders   DG Ribs Unilateral W/Chest Left    Meds ordered this encounter  Medications   losartan (COZAAR) 25 MG tablet    Sig: Take 1 tablet (25 mg total) by mouth daily.    Dispense:  90 tablet    Refill:  0    Order Specific Question:   Supervising Provider    Answer:   Roxy Manns A [[id number]]    Return in about 3 months (around 11/14/2023) for CPE and Labs.  Audria Nine, NP

## 2023-08-15 LAB — CBC
HCT: 31 % — ABNORMAL LOW (ref 35.0–45.0)
Hemoglobin: 10.2 g/dL — ABNORMAL LOW (ref 11.7–15.5)
MCH: 29.4 pg (ref 27.0–33.0)
MCHC: 32.9 g/dL (ref 32.0–36.0)
MCV: 89.3 fL (ref 80.0–100.0)
MPV: 9.4 fL (ref 7.5–12.5)
Platelets: 209 10*3/uL (ref 140–400)
RBC: 3.47 10*6/uL — ABNORMAL LOW (ref 3.80–5.10)
RDW: 12.8 % (ref 11.0–15.0)
WBC: 7.8 10*3/uL (ref 3.8–10.8)

## 2023-08-15 LAB — COMPREHENSIVE METABOLIC PANEL
AG Ratio: 1.8 (calc) (ref 1.0–2.5)
ALT: 24 U/L (ref 6–29)
AST: 31 U/L (ref 10–35)
Albumin: 3.7 g/dL (ref 3.6–5.1)
Alkaline phosphatase (APISO): 115 U/L (ref 37–153)
BUN: 20 mg/dL (ref 7–25)
CO2: 29 mmol/L (ref 20–32)
Calcium: 9.3 mg/dL (ref 8.6–10.4)
Chloride: 97 mmol/L — ABNORMAL LOW (ref 98–110)
Creat: 0.88 mg/dL (ref 0.50–1.03)
Globulin: 2.1 g/dL (ref 1.9–3.7)
Glucose, Bld: 95 mg/dL (ref 65–99)
Potassium: 3.7 mmol/L (ref 3.5–5.3)
Sodium: 135 mmol/L (ref 135–146)
Total Bilirubin: 0.5 mg/dL (ref 0.2–1.2)
Total Protein: 5.8 g/dL — ABNORMAL LOW (ref 6.1–8.1)

## 2023-08-18 ENCOUNTER — Other Ambulatory Visit: Payer: Self-pay | Admitting: Nurse Practitioner

## 2023-08-18 DIAGNOSIS — R710 Precipitous drop in hematocrit: Secondary | ICD-10-CM

## 2023-08-19 ENCOUNTER — Telehealth: Payer: Self-pay | Admitting: Nurse Practitioner

## 2023-08-19 LAB — VITAMIN B1: Vitamin B1 (Thiamine): <6 nmol/L — ABNORMAL LOW (ref 8–30)

## 2023-08-19 NOTE — Telephone Encounter (Signed)
Order is in the chart she just needs to stop by and pick it up

## 2023-08-19 NOTE — Telephone Encounter (Signed)
-----   Message from [facility] T sent at 08/18/2023  4:40 PM EDT ----- Contacted pt. Pt has seen results via Mychart. Pt has agreed to getting stool test done.

## 2023-08-20 ENCOUNTER — Other Ambulatory Visit: Payer: Self-pay | Admitting: Nurse Practitioner

## 2023-08-20 ENCOUNTER — Telehealth: Payer: Self-pay | Admitting: Nurse Practitioner

## 2023-08-20 DIAGNOSIS — R0781 Pleurodynia: Secondary | ICD-10-CM

## 2023-08-20 DIAGNOSIS — F1090 Alcohol use, unspecified, uncomplicated: Secondary | ICD-10-CM

## 2023-08-20 MED ORDER — THIAMINE HCL 100 MG PO TABS
100.0000 mg | ORAL_TABLET | Freq: Every day | ORAL | 0 refills | Status: DC
Start: 2023-08-20 — End: 2023-11-04

## 2023-08-20 NOTE — Telephone Encounter (Signed)
Pt called in and stated that she is still in pain in her ribs and would like for a call back to discuss a cat scan been put in for her by provider

## 2023-08-20 NOTE — Telephone Encounter (Signed)
Patient still having excruciating pain in ribs left side. Pain described as sharp constant pain. She has not tried Voltaren gel. I attempted to get more information but patient was in appointment and not able to talk more at this time. She did say that she is still having foot pain as well. Patient request CT scan on both.

## 2023-08-21 NOTE — Telephone Encounter (Signed)
Called patient have set up for lab only appointment on Monday. Offered something today but she is not able to make it.

## 2023-08-21 NOTE — Telephone Encounter (Signed)
Looks like she has seen ortho for the foot so she can ask them next steps in regards to the foot. I need to get some labs prior to the CT scan to make sure it is not something like her pancreas causing the pain. If that is normal then we can get the CT scan. This can be done without an office visit. I have already placed the orders

## 2023-08-21 NOTE — Telephone Encounter (Signed)
Called pt left detailed message per DPR.

## 2023-08-24 ENCOUNTER — Other Ambulatory Visit: Payer: Managed Care, Other (non HMO)

## 2023-08-26 ENCOUNTER — Other Ambulatory Visit (HOSPITAL_COMMUNITY): Payer: Self-pay | Admitting: Physician Assistant

## 2023-08-26 ENCOUNTER — Other Ambulatory Visit (INDEPENDENT_AMBULATORY_CARE_PROVIDER_SITE_OTHER): Payer: Self-pay

## 2023-08-26 ENCOUNTER — Telehealth (HOSPITAL_COMMUNITY): Payer: Commercial Managed Care - HMO | Admitting: Physician Assistant

## 2023-08-26 DIAGNOSIS — R710 Precipitous drop in hematocrit: Secondary | ICD-10-CM

## 2023-08-26 DIAGNOSIS — G4700 Insomnia, unspecified: Secondary | ICD-10-CM

## 2023-08-26 DIAGNOSIS — F339 Major depressive disorder, recurrent, unspecified: Secondary | ICD-10-CM

## 2023-08-26 DIAGNOSIS — F1090 Alcohol use, unspecified, uncomplicated: Secondary | ICD-10-CM

## 2023-08-26 DIAGNOSIS — F411 Generalized anxiety disorder: Secondary | ICD-10-CM

## 2023-08-26 DIAGNOSIS — Z634 Disappearance and death of family member: Secondary | ICD-10-CM

## 2023-08-26 LAB — FECAL OCCULT BLOOD, IMMUNOCHEMICAL: Fecal Occult Bld: NEGATIVE

## 2023-08-27 ENCOUNTER — Encounter (HOSPITAL_COMMUNITY): Payer: Self-pay | Admitting: Physician Assistant

## 2023-08-27 DIAGNOSIS — F1090 Alcohol use, unspecified, uncomplicated: Secondary | ICD-10-CM

## 2023-08-27 DIAGNOSIS — F339 Major depressive disorder, recurrent, unspecified: Secondary | ICD-10-CM | POA: Diagnosis not present

## 2023-08-27 DIAGNOSIS — Z634 Disappearance and death of family member: Secondary | ICD-10-CM

## 2023-08-27 DIAGNOSIS — F411 Generalized anxiety disorder: Secondary | ICD-10-CM | POA: Diagnosis not present

## 2023-08-27 DIAGNOSIS — G4700 Insomnia, unspecified: Secondary | ICD-10-CM | POA: Diagnosis not present

## 2023-08-27 MED ORDER — GABAPENTIN 300 MG PO CAPS
300.0000 mg | ORAL_CAPSULE | Freq: Three times a day (TID) | ORAL | 1 refills | Status: DC
Start: 2023-08-27 — End: 2023-12-31

## 2023-08-27 MED ORDER — FLUOXETINE HCL 20 MG PO CAPS
ORAL_CAPSULE | ORAL | 0 refills | Status: DC
Start: 2023-08-27 — End: 2023-11-04

## 2023-08-27 MED ORDER — QUETIAPINE FUMARATE 400 MG PO TABS
400.0000 mg | ORAL_TABLET | Freq: Every day | ORAL | 1 refills | Status: DC
Start: 2023-08-27 — End: 2023-11-19

## 2023-08-27 MED ORDER — TRAZODONE HCL 100 MG PO TABS
200.0000 mg | ORAL_TABLET | Freq: Every day | ORAL | 1 refills | Status: DC
Start: 2023-08-27 — End: 2023-11-19

## 2023-08-27 MED ORDER — HYDROXYZINE HCL 50 MG PO TABS: 50.0000 mg | ORAL_TABLET | Freq: Three times a day (TID) | ORAL | 1 refills | Status: DC | PRN
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
## 2023-09-01 NOTE — Telephone Encounter (Signed)
Message acknowledged and reviewed.

## 2023-09-08 ENCOUNTER — Telehealth (HOSPITAL_COMMUNITY): Payer: Self-pay | Admitting: *Deleted

## 2023-09-08 NOTE — Telephone Encounter (Signed)
Patient called stating that her MD stopped her Prozac but was going to replace it with a different medication and she has not heard anything back about what was going to be started. She is asking what the medication is and when it will be sent to her pharmacy.

## 2023-09-09 ENCOUNTER — Telehealth: Payer: Self-pay

## 2023-09-09 ENCOUNTER — Other Ambulatory Visit: Payer: Self-pay

## 2023-09-09 ENCOUNTER — Other Ambulatory Visit (HOSPITAL_COMMUNITY): Payer: Self-pay | Admitting: Psychiatry

## 2023-09-09 DIAGNOSIS — G4700 Insomnia, unspecified: Secondary | ICD-10-CM

## 2023-09-09 DIAGNOSIS — Z1211 Encounter for screening for malignant neoplasm of colon: Secondary | ICD-10-CM

## 2023-09-09 MED ORDER — NA SULFATE-K SULFATE-MG SULF 17.5-3.13-1.6 GM/177ML PO SOLN: 354.0000 mL | Freq: Once | ORAL | 0 refills | Status: AC

## 2023-09-09 MED ORDER — POLYETHYLENE GLYCOL 3350 17 GM/SCOOP PO POWD: 238.0000 g | Freq: Every day | ORAL | 0 refills | Status: DC

## 2023-09-09 MED ORDER — POLYETHYLENE GLYCOL 3350 17 GM/SCOOP PO POWD: 1.0000 | Freq: Every day | ORAL | 0 refills | Status: DC

## 2023-09-09 NOTE — Telephone Encounter (Signed)
Patient is due for repeat colonoscopy with Dr. Allegra Lai since last one prep was poor.  Called patient and patient is okay with scheduling repeat colonoscopy. Schedule it for 09/30/2023 with Dr. Allegra Lai at [facility]. Went over instructions, mailed them and sent to [employer]. Sent prep to the pharmacy. Will get cardiac clearance from cardiology

## 2023-09-09 NOTE — Telephone Encounter (Signed)
1st attempt to reach pt. No answer. Lvm

## 2023-09-09 NOTE — Telephone Encounter (Signed)
   [facility] Pre-operative Risk Assessment    Request for surgical clearance:  What type of surgery is being performed? Colonoscopy    When is this surgery scheduled? 09/30/2023   Are there any medications that need to be held prior to surgery and how long?none   Practice name and name of physician performing surgery? [facility] Dr. Lannette Donath    What is your office phone and fax number? [phone]   Anesthesia type (None, local, MAC, general) ? General    Denman George 09/09/2023, 9:31 AM  _________________________________________________________________   (provider comments below)

## 2023-09-11 NOTE — Telephone Encounter (Signed)
2nd attempt to reach pt. No answer. Lvm

## 2023-09-14 ENCOUNTER — Telehealth (HOSPITAL_COMMUNITY): Payer: Self-pay

## 2023-09-14 NOTE — Telephone Encounter (Signed)
Medication management - Message left for patient, after she left one requesting Otila Back, PA call her back regarding a new medication he had discussed starting for her. Informed Otila Back, PA was out of the office today, but would leave him a message to call her back to discuss.

## 2023-09-15 NOTE — Telephone Encounter (Signed)
FYI, colonoscopy will be cancelled as pt not able to be reached by cardiology for pre-op clearance  RV

## 2023-09-15 NOTE — Telephone Encounter (Signed)
Will need to confirm with pre op APP before I call the pt; if pt is still following with [facility] and [facility], does she need in office appt.

## 2023-09-15 NOTE — Telephone Encounter (Signed)
3rd attempt to reach the pt. Our office will need to confirm if pt is still following [facility]. If so she will need an appt in office. I will send FYI to the surgeon's office  and remove from the preop call back as we have attempted x 3 to reach the pt.

## 2023-09-15 NOTE — Telephone Encounter (Signed)
Called and left a message for call back  

## 2023-09-16 NOTE — Telephone Encounter (Signed)
Called and left a message for patient. Sent patient a Clinical cytogeneticist message informing her the colonoscopy is cancel and she needs to make a appointment with cardiology. Called endo and talk to [facility] and she will get colonoscopy cancel

## 2023-09-18 ENCOUNTER — Other Ambulatory Visit (HOSPITAL_COMMUNITY): Payer: Self-pay | Admitting: Physician Assistant

## 2023-09-18 DIAGNOSIS — G4700 Insomnia, unspecified: Secondary | ICD-10-CM

## 2023-09-19 ENCOUNTER — Other Ambulatory Visit (HOSPITAL_COMMUNITY): Payer: Self-pay | Admitting: Psychiatry

## 2023-09-19 DIAGNOSIS — G4700 Insomnia, unspecified: Secondary | ICD-10-CM

## 2023-09-19 DIAGNOSIS — F339 Major depressive disorder, recurrent, unspecified: Secondary | ICD-10-CM

## 2023-09-24 ENCOUNTER — Emergency Department (HOSPITAL_COMMUNITY)
Admission: EM | Admit: 2023-09-24 | Discharge: 2023-09-25 | Disposition: A | Discharge status: Home / Self Care | Payer: Commercial Managed Care - HMO | Attending: Emergency Medicine | Admitting: Emergency Medicine

## 2023-09-24 ENCOUNTER — Other Ambulatory Visit: Payer: Self-pay

## 2023-09-24 ENCOUNTER — Encounter (HOSPITAL_COMMUNITY): Payer: Self-pay

## 2023-09-24 DIAGNOSIS — T43211A Poisoning by selective serotonin and norepinephrine reuptake inhibitors, accidental (unintentional), initial encounter: Secondary | ICD-10-CM | POA: Diagnosis not present

## 2023-09-24 DIAGNOSIS — T50901A Poisoning by unspecified drugs, medicaments and biological substances, accidental (unintentional), initial encounter: Secondary | ICD-10-CM | POA: Diagnosis present

## 2023-09-24 DIAGNOSIS — T6591XA Toxic effect of unspecified substance, accidental (unintentional), initial encounter: Secondary | ICD-10-CM

## 2023-09-24 DIAGNOSIS — X58XXXA Exposure to other specified factors, initial encounter: Secondary | ICD-10-CM | POA: Insufficient documentation

## 2023-09-24 DIAGNOSIS — T43591A Poisoning by other antipsychotics and neuroleptics, accidental (unintentional), initial encounter: Secondary | ICD-10-CM | POA: Diagnosis not present

## 2023-09-24 DIAGNOSIS — Y905 Blood alcohol level of 100-119 mg/100 ml: Secondary | ICD-10-CM | POA: Diagnosis not present

## 2023-09-24 DIAGNOSIS — T450X1A Poisoning by antiallergic and antiemetic drugs, accidental (unintentional), initial encounter: Secondary | ICD-10-CM | POA: Insufficient documentation

## 2023-09-24 LAB — SALICYLATE LEVEL: Salicylate Lvl: <7 mg/dL — ABNORMAL LOW (ref 7.0–30.0)

## 2023-09-24 LAB — RAPID URINE DRUG SCREEN, HOSP PERFORMED
Amphetamines: POSITIVE — AB
Barbiturates: NOT DETECTED
Benzodiazepines: NOT DETECTED
Cocaine: NOT DETECTED
Opiates: NOT DETECTED
Tetrahydrocannabinol: POSITIVE — AB

## 2023-09-24 LAB — COMPREHENSIVE METABOLIC PANEL
ALT: 12 U/L (ref 0–44)
AST: 25 U/L (ref 15–41)
Albumin: 3.3 g/dL — ABNORMAL LOW (ref 3.5–5.0)
Alkaline Phosphatase: 90 U/L (ref 38–126)
Anion gap: 14 (ref 5–15)
BUN: 8 mg/dL (ref 6–20)
CO2: 18 mmol/L — ABNORMAL LOW (ref 22–32)
Calcium: 8.7 mg/dL — ABNORMAL LOW (ref 8.9–10.3)
Chloride: 110 mmol/L (ref 98–111)
Creatinine, Ser: 0.84 mg/dL (ref 0.44–1.00)
GFR, Estimated: >60 mL/min (ref >60)
Glucose, Bld: 66 mg/dL — ABNORMAL LOW (ref 70–99)
Potassium: 4.1 mmol/L (ref 3.5–5.1)
Sodium: 142 mmol/L (ref 135–145)
Total Bilirubin: 0.3 mg/dL (ref <1.2)
Total Protein: 5.7 g/dL — ABNORMAL LOW (ref 6.5–8.1)

## 2023-09-24 LAB — CBC WITH DIFFERENTIAL/PLATELET
Abs Immature Granulocytes: 0.01 10*3/uL (ref 0.00–0.07)
Basophils Absolute: 0 10*3/uL (ref 0.0–0.1)
Basophils Relative: 0 %
Eosinophils Absolute: 0.1 10*3/uL (ref 0.0–0.5)
Eosinophils Relative: 1 %
HCT: 33.4 % — ABNORMAL LOW (ref 36.0–46.0)
Hemoglobin: 10.7 g/dL — ABNORMAL LOW (ref 12.0–15.0)
Immature Granulocytes: 0 %
Lymphocytes Relative: 21 %
Lymphs Abs: 1.2 10*3/uL (ref 0.7–4.0)
MCH: 28.8 pg (ref 26.0–34.0)
MCHC: 32 g/dL (ref 30.0–36.0)
MCV: 90 fL (ref 80.0–100.0)
Monocytes Absolute: 0.2 10*3/uL (ref 0.1–1.0)
Monocytes Relative: 3 %
Neutro Abs: 4.1 10*3/uL (ref 1.7–7.7)
Neutrophils Relative %: 75 %
Platelets: 318 10*3/uL (ref 150–400)
RBC: 3.71 MIL/uL — ABNORMAL LOW (ref 3.87–5.11)
RDW: 15.9 % — ABNORMAL HIGH (ref 11.5–15.5)
WBC: 5.5 10*3/uL (ref 4.0–10.5)
nRBC: 0 % (ref 0.0–0.2)

## 2023-09-24 LAB — ACETAMINOPHEN LEVEL: Acetaminophen (Tylenol), Serum: <10 ug/mL — ABNORMAL LOW (ref 10–30)

## 2023-09-24 LAB — ETHANOL: Alcohol, Ethyl (B): 110 mg/dL — ABNORMAL HIGH (ref <10)

## 2023-09-24 NOTE — Discharge Instructions (Signed)
Be careful with your medications.  Taking THC, alcohol and those medications that you are on will often make you sleepy.

## 2023-09-24 NOTE — ED Triage Notes (Signed)
Pt arrived from home via [facility] c/o weakness that began after pt took her HS medication (seroquel, trazodone, hydroxyzine) with an additional single serve bottle of wine and one half of a THC gummy. Pt states that she is weak and dizzy, but has been dizzy for over a month and that the weakness began after she took all the medicine and drank the wine. Pt has a NIH of 0. A & O x 4.
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
## 2023-09-29 ENCOUNTER — Telehealth: Payer: Self-pay | Admitting: Nurse Practitioner

## 2023-09-29 NOTE — Telephone Encounter (Signed)
Patient dropped off document Surgical Clearance, to be filled out by provider. Patient requested to send it back via Call Patient to pick up within 5-days. Document is located in providers tray at front office.Please advise at Mobile [phone] (mobile)

## 2023-09-30 NOTE — Telephone Encounter (Signed)
Placed ppw in pcp folder. Will call patient once the form has been reviewed and signed.

## 2023-10-01 ENCOUNTER — Telehealth: Payer: Self-pay | Admitting: Nurse Practitioner

## 2023-10-01 ENCOUNTER — Encounter: Payer: Self-pay | Admitting: Family Medicine

## 2023-10-01 ENCOUNTER — Encounter: Admission: RE | Payer: Self-pay | Source: Home / Self Care

## 2023-10-01 ENCOUNTER — Telehealth: Payer: Commercial Managed Care - HMO | Admitting: Family Medicine

## 2023-10-01 ENCOUNTER — Ambulatory Visit: Admission: RE | Admit: 2023-10-01 | Payer: Self-pay | Source: Home / Self Care | Admitting: Gastroenterology

## 2023-10-01 ENCOUNTER — Ambulatory Visit (INDEPENDENT_AMBULATORY_CARE_PROVIDER_SITE_OTHER): Payer: Commercial Managed Care - HMO

## 2023-10-01 DIAGNOSIS — Z20822 Contact with and (suspected) exposure to covid-19: Secondary | ICD-10-CM

## 2023-10-01 DIAGNOSIS — R051 Acute cough: Secondary | ICD-10-CM

## 2023-10-01 LAB — POC COVID19 BINAXNOW: SARS Coronavirus 2 Ag: NEGATIVE

## 2023-10-01 SURGERY — COLONOSCOPY WITH PROPOFOL
Anesthesia: General

## 2023-10-01 MED ORDER — AMOXICILLIN 875 MG PO TABS: 875.0000 mg | ORAL_TABLET | Freq: Two times a day (BID) | ORAL | 0 refills | Status: DC

## 2023-10-01 NOTE — Progress Notes (Signed)
    Carla Shere T. Leeroy Lovings, MD, CAQ Sports Medicine [facility] [address] [place], [address]  Phone: [phone]  FAX: [phone]  Carla Martinez - 62 y.o. female  MRN [id number]  Date of Birth: 1961-04-02  Date: 10/01/2023  PCP: Eden Emms, NP  Referral: Eden Emms, NP  Cc: cough  Virtual Visit via Video Note:  I connected with  [name] on 10/01/2023  9:20 AM EST by a video enabled telemedicine application and verified that I am speaking with the correct person using two identifiers.   Location patient: home computer, tablet, or smartphone Location provider: work or home office Consent: Verbal consent directly obtained from [facility]. Persons participating in the virtual visit: patient, provider  I discussed the limitations of evaluation and management by telemedicine and the availability of in person appointments. The patient expressed understanding and agreed to proceed.  CC: Cough  History of Present Illness:  About a week ago, started to get headaches.  The nose started to run, sore throat, coughing, diarrhea.    Congestion goes from completely stopped up or running.  She is having some sinus pain. She has not done a COVID-19 test  Quit smoking about a year ago.   No fever.  Cold sweats sometimes.  Diarrhea yesterday - really loose NO n/v UTI only a few months ago.   She is going to come in at 10:30 for a nurse visit to check COVID-19.  Review of Systems as above: See pertinent positives and pertinent negatives per HPI No acute distress verbally   Observations/Objective/Exam:  An attempt was made to discern vital signs over the phone and per patient if applicable and possible.   General:    Alert, Oriented, appears well and in no acute distress  Pulmonary:     On inspection no signs of respiratory distress.  Psych / Neurological:     Pleasant and cooperative.  Assessment  and Plan:    ICD-10-CM   1. Acute cough  R05.1     2. Suspected COVID-19 virus infection  Z20.822      COVID test was negative.  Is very difficult to know for sure what is going on with her through video visit.  Possible viral syndrome.  With sinus and pressure and pain, I think covering her with some amoxicillin for potential sinusitis is reasonable.  I discussed the assessment and treatment plan with the patient. The patient was provided an opportunity to ask questions and all were answered. The patient agreed with the plan and demonstrated an understanding of the instructions.   The patient was advised to call back or seek an in-person evaluation if the symptoms worsen or if the condition fails to improve as anticipated.  Follow-up: prn unless noted otherwise below No follow-ups on file.  Meds ordered this encounter  Medications   amoxicillin (AMOXIL) 875 MG tablet    Sig: Take 1 tablet (875 mg total) by mouth 2 (two) times daily.    Dispense:  20 tablet    Refill:  0   No orders of the defined types were placed in this encounter.   Signed,  Elpidio Galea. Emberleigh Reily, MD

## 2023-10-01 NOTE — Telephone Encounter (Signed)
Got noticed that patient needs to be surgically cleared. She will need an appointment

## 2023-10-05 ENCOUNTER — Ambulatory Visit: Payer: Commercial Managed Care - HMO | Admitting: Nurse Practitioner

## 2023-10-07 ENCOUNTER — Telehealth (HOSPITAL_COMMUNITY): Payer: Commercial Managed Care - HMO | Admitting: Physician Assistant

## 2023-10-07 ENCOUNTER — Telehealth: Payer: Self-pay | Admitting: Nurse Practitioner

## 2023-10-07 ENCOUNTER — Ambulatory Visit (INDEPENDENT_AMBULATORY_CARE_PROVIDER_SITE_OTHER): Payer: Commercial Managed Care - HMO | Admitting: Nurse Practitioner

## 2023-10-07 ENCOUNTER — Ambulatory Visit (INDEPENDENT_AMBULATORY_CARE_PROVIDER_SITE_OTHER)
Admission: RE | Admit: 2023-10-07 | Discharge: 2023-10-07 | Disposition: A | Discharge status: Home / Self Care | Payer: Commercial Managed Care - HMO | Source: Ambulatory Visit | Attending: Nurse Practitioner | Admitting: Nurse Practitioner

## 2023-10-07 ENCOUNTER — Encounter (HOSPITAL_COMMUNITY): Payer: Self-pay

## 2023-10-07 ENCOUNTER — Encounter: Payer: Self-pay | Admitting: Nurse Practitioner

## 2023-10-07 VITALS — BP 100/60 | HR 78 | Temp 97.6°F | Ht 63.0 in | Wt 98.2 lb

## 2023-10-07 DIAGNOSIS — Z01818 Encounter for other preprocedural examination: Secondary | ICD-10-CM | POA: Diagnosis not present

## 2023-10-07 DIAGNOSIS — Z87891 Personal history of nicotine dependence: Secondary | ICD-10-CM | POA: Diagnosis not present

## 2023-10-07 NOTE — Patient Instructions (Signed)
Nice to see you today I will be in touch with the chest xray results once I have them Follow up with me in 3-4 months for a physical

## 2023-10-07 NOTE — Telephone Encounter (Signed)
Dr. Sinclair Ship patient in office.  Ortho is wanting her cleared for hardware removal of her right hip.  I saw PT in the past but once again referred to the advanced heart failure clinic.  Does she need to see you for cardiac clearance?  I do not think she is ever been seen by the advanced heart failure clinic.  Thanks for your time  Margie Billet

## 2023-10-07 NOTE — Assessment & Plan Note (Signed)
Patient is moderate risk for surgery.  As long as chest x-ray comes back looking okay I will clear her medically for surgery.  Patient still needs to be cleared by her cardiologist.  I did send a message to see if he is willing to do it or if she needs to be referred to the advanced heart failure clinic and be evaluated per his last note.
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
## 2023-10-08 ENCOUNTER — Telehealth: Payer: Self-pay

## 2023-10-08 ENCOUNTER — Telehealth: Payer: Self-pay | Admitting: Cardiology

## 2023-10-08 ENCOUNTER — Other Ambulatory Visit (HOSPITAL_COMMUNITY): Payer: Self-pay | Admitting: Physician Assistant

## 2023-10-08 DIAGNOSIS — F411 Generalized anxiety disorder: Secondary | ICD-10-CM

## 2023-10-08 NOTE — Telephone Encounter (Signed)
We received cardiac clearance from Cardiology that we can schedule colonoscopy for repeat colonoscopy. Called patient and left a message for call back

## 2023-10-08 NOTE — Telephone Encounter (Signed)
   Patient Name: Carla Martinez  DOB: August 10, 1961 MRN: [id number]  Primary Cardiologist: Debbe Odea, MD  Chart reviewed as part of pre-operative protocol coverage. Given past medical history and time since last visit, based on ACC/AHA guidelines, Rea Arizola is at acceptable risk for the planned procedure without further cardiovascular testing.   The patient was advised that if she develops new symptoms prior to surgery to contact our office to arrange for a follow-up visit, and she verbalized understanding.  I will route this recommendation to the requesting party via Epic fax function and remove from pre-op pool.  Please call with questions.  Napoleon Form, Leodis Rains, NP 10/08/2023, 9:04 AM

## 2023-10-08 NOTE — Telephone Encounter (Signed)
Message acknowledged and reviewed.

## 2023-10-08 NOTE — Telephone Encounter (Signed)
   Pre-operative Risk Assessment    Patient Name: Carla Martinez  DOB: 1961-02-21 MRN: [id number]      Request for Surgical Clearance    Procedure:   R hip hardware removal  Date of Surgery:  Clearance TBD                                 Surgeon:  Dr. Samson Frederic Surgeon's Group or Practice Name:  [facility] Phone number:  [phone] Fax number:  [phone]   Type of Clearance Requested:   - Medical    Type of Anesthesia:  Spinal   Additional requests/questions:    SignedShawna Orleans   10/08/2023, 10:58 AM

## 2023-10-08 NOTE — Telephone Encounter (Signed)
Message acknowledged and reviewed.  Provider attempted several times to contact patient to schedule a follow-up appointment, but no answer.  Provider will continue to contact patient for medication management.

## 2023-10-09 NOTE — Telephone Encounter (Signed)
Called and left a message for call back  

## 2023-10-12 ENCOUNTER — Other Ambulatory Visit: Payer: Self-pay

## 2023-10-12 DIAGNOSIS — Z1211 Encounter for screening for malignant neoplasm of colon: Secondary | ICD-10-CM

## 2023-10-12 MED ORDER — NA SULFATE-K SULFATE-MG SULF 17.5-3.13-1.6 GM/177ML PO SOLN: 354.0000 mL | Freq: Once | ORAL | 0 refills | Status: AC

## 2023-10-12 NOTE — Telephone Encounter (Signed)
Called patient and we reschedule procedure to 11/05/2023. Sent prep to the pharmacy. Went over instructions, mailed them and sent to [employer].

## 2023-10-13 ENCOUNTER — Ambulatory Visit: Payer: Commercial Managed Care - HMO | Admitting: Nurse Practitioner

## 2023-10-13 ENCOUNTER — Ambulatory Visit: Payer: Self-pay | Admitting: Nurse Practitioner

## 2023-10-13 VITALS — Ht 63.0 in

## 2023-10-13 DIAGNOSIS — R0789 Other chest pain: Secondary | ICD-10-CM | POA: Diagnosis not present

## 2023-10-13 DIAGNOSIS — R051 Acute cough: Secondary | ICD-10-CM | POA: Diagnosis not present

## 2023-10-13 MED ORDER — GUAIFENESIN-CODEINE 100-10 MG/5ML PO SOLN: 5.0000 mL | Freq: Three times a day (TID) | ORAL | 0 refills | Status: AC | PRN

## 2023-10-13 MED ORDER — PREDNISONE 20 MG PO TABS: ORAL_TABLET | ORAL | 0 refills | Status: AC

## 2023-10-13 NOTE — Telephone Encounter (Signed)
Noted  patient was evaluated over video visit

## 2023-10-13 NOTE — Telephone Encounter (Signed)
  Chief Complaint: cough Symptoms: cough, mild SOB, runny nose, congestion Pertinent Negatives: Patient denies fever, chest pain Disposition: [] ED /[] Urgent Care (no appt availability in office) / [x] Appointment(In office/virtual)/ []  [place] [facility]/ [] Home Care/ [] Refused Recommended Disposition /[] [place] [facility]/ []  Follow-up with PCP Additional Notes: Patient reports that she has been having cough and mild SOB x2 weeks. Patient was seen previously and diagnosed with URI and was prescribed Amoxicillin. Patient reports she has completed antibiotics and is still having symptoms. Patient would like to speak with someone about being prescribed additional antibiotics and cough medicine. Per protocol appt scheduled today 12/24. Appt scheduled virtual video at patient request.  Patient verbalized understanding.    Copied from CRM [id number]. Topic: Clinical - Red Word Triage >> Oct 13, 2023 10:18 AM Denese Killings wrote: Red Word that prompted transfer to Nurse Triage: Patient states that she is done taking her antibiotic and it hasn't helped her.Patient still has a cough with shortness of breath. Patient wants another antibiotic and cough medicine sent to the pharmacy. [facility] [address] if possible. Reason for Disposition  [1] MILD difficulty breathing (e.g., minimal/no SOB at rest, SOB with walking, pulse <100) AND [2] still present when not coughing  Answer Assessment - Initial Assessment Questions 1. ONSET: "When did the cough begin?"      2 weeks 2. SEVERITY: "How bad is the cough today?"      severe 3. SPUTUM: "Describe the color of your sputum" (none, dry cough; clear, white, yellow, green)     none 4. HEMOPTYSIS: "Are you coughing up any blood?" If so ask: "How much?" (flecks, streaks, tablespoons, etc.)     none 5. DIFFICULTY BREATHING: "Are you having difficulty breathing?" If Yes, ask: "How bad is it?" (e.g., mild, moderate, severe)    - MILD:  No SOB at rest, mild SOB with walking, speaks normally in sentences, can lie down, no retractions, pulse < 100.    - MODERATE: SOB at rest, SOB with minimal exertion and prefers to sit, cannot lie down flat, speaks in phrases, mild retractions, audible wheezing, pulse 100-120.    - SEVERE: Very SOB at rest, speaks in single words, struggling to breathe, sitting hunched forward, retractions, pulse > 120      mild 6. FEVER: "Do you have a fever?" If Yes, ask: "What is your temperature, how was it measured, and when did it start?"     no 7. CARDIAC HISTORY: "Do you have any history of heart disease?" (e.g., heart attack, congestive heart failure)      none 8. LUNG HISTORY: "Do you have any history of lung disease?"  (e.g., pulmonary embolus, asthma, emphysema)     none 9. PE RISK FACTORS: "Do you have a history of blood clots?" (or: recent major surgery, recent prolonged travel, bedridden)     Recently had URI and finished Amoxicillin 10. OTHER SYMPTOMS: "Do you have any other symptoms?" (e.g., runny nose, wheezing, chest pain)       Running nose, congestion  Protocols used: Cough - Acute Non-Productive-A-AH

## 2023-10-13 NOTE — Progress Notes (Signed)
Ph: [phone] Fax: [phone]   Patient ID: Carla Martinez, female    DOB: October 13, 1961, 62 y.o.   MRN: [id number]  Virtual visit completed through MyChart, a video enabled telemedicine application. Due to national recommendations of social distancing due to COVID-19, a virtual visit is felt to be most appropriate for this patient at this time. Reviewed limitations, risks, security and privacy concerns of performing a virtual visit and the availability of in person appointments. I also reviewed that there may be a patient responsible charge related to this service. The patient agreed to proceed.   Patient location: home Provider location: [place] [facility], office Persons participating in this virtual visit: patient, provider   If any vitals were documented, they were collected by patient at home unless specified below.    Ht 5\' 3"  (1.6 m) Comment: per chart  BMI 17.40 kg/m    CC: cough Subjective:   HPI: Carla Martinez is a 62 y.o. female presenting on 10/13/2023 for Cough (Chest and head congestion. Pt states she has been coughing a lot. Pt states antibiotics did not help. Pt states she has finished the amoxicillin. )   Patient was seen on 10/01/2023 by spencer coplnad for a cough. She was tested for covid that came back negative   Patient had a chest xray from me on 09/20/2023 for a pre-operative clearance.   She was prescribed  amoxicillin to cover for a possible sinusitis. She was written amoxicillic 875. States that the cough has not changed at all. States that the chest seems heavy    State that mucinex and nquill. Nyquill does help the patinet be able to rest some    Relevant past medical, surgical, family and social history reviewed and updated as indicated. Interim medical history since our last visit reviewed. Allergies and medications reviewed and updated. Outpatient Medications Prior to Visit  Medication Sig Dispense Refill   amoxicillin  (AMOXIL) 875 MG tablet Take 1 tablet (875 mg total) by mouth 2 (two) times daily. 20 tablet 0   Biotin 72536 MCG TABS Take 1 tablet by mouth daily.     calcium carbonate (OS-CAL - DOSED IN MG OF ELEMENTAL CALCIUM) 1250 (500 Ca) MG tablet Take 1 tablet by mouth daily.     FLUoxetine (PROZAC) 20 MG capsule Patient to take fluoxetine 1 tablet (20 mg total) for the next 3 days before discontinuing. 3 capsule 0   FLUoxetine (PROZAC) 40 MG capsule Take 1 capsule (40 mg total) by mouth daily. 30 capsule 1   gabapentin (NEURONTIN) 300 MG capsule Take 1 capsule (300 mg total) by mouth 3 (three) times daily. 90 capsule 1   hydrOXYzine (ATARAX) 50 MG tablet Take 1 tablet by mouth three times daily as needed 75 tablet 0   linaclotide (LINZESS) 72 MCG capsule Take 1 capsule (72 mcg total) by mouth daily before breakfast. 30 capsule 2   losartan (COZAAR) 25 MG tablet Take 1 tablet (25 mg total) by mouth daily. 90 tablet 0   Magnesium 250 MG TABS Take 250 mg by mouth daily.     metoprolol succinate (TOPROL XL) 25 MG 24 hr tablet Take 1 tablet (25 mg total) by mouth daily. 90 tablet 3   Na Sulfate-K Sulfate-Mg Sulf 17.5-3.13-1.6 GM/177ML SOLN      oxyCODONE-acetaminophen (PERCOCET/ROXICET) 5-325 MG tablet Take 1 tablet by mouth every 6 (six) hours as needed for severe pain.     pantoprazole (PROTONIX) 40 MG tablet Take 1 tablet (40  mg total) by mouth daily. 90 tablet 1   polyethylene glycol powder (GLYCOLAX/MIRALAX) 17 GM/SCOOP powder Take 238 g by mouth daily. 238 g 0   potassium chloride SA (KLOR-CON M) 20 MEQ tablet Take 2 tablets (40 mEq total) by mouth once for 1 dose. 2 tablet 0   QUEtiapine (SEROQUEL) 400 MG tablet Take 1 tablet (400 mg total) by mouth at bedtime. 30 tablet 1   thiamine (VITAMIN B1) 100 MG tablet Take 1 tablet (100 mg total) by mouth daily. 30 tablet 0   traZODone (DESYREL) 100 MG tablet Take 2 tablets (200 mg total) by mouth at bedtime. 60 tablet 1   TURMERIC PO Take 500 mg by mouth  daily. w Dinah Beers     No facility-administered medications prior to visit.     Per HPI unless specifically indicated in ROS section below Review of Systems  Constitutional:  Positive for fatigue. Negative for chills and fever.  HENT:  Positive for sore throat. Negative for ear pain.   Respiratory:  Positive for cough and shortness of breath. Negative for wheezing.        Sputum  Cardiovascular:  Positive for chest pain (chest heavy).  Gastrointestinal:  Positive for constipation (feels like she needs to go but cannot). Negative for abdominal pain and diarrhea.  Musculoskeletal:  Negative for arthralgias and myalgias.  Neurological:  Positive for headaches.   Objective:  Ht 5\' 3"  (1.6 m) Comment: per chart  BMI 17.40 kg/m   Wt Readings from Last 3 Encounters:  10/07/23 98 lb 3.2 oz (44.5 kg)  09/24/23 97 lb (44 kg)  08/14/23 96 lb (43.5 kg)       Physical exam: Gen: alert, NAD, not ill appearing Pulm: speaks in complete sentences without increased work of breathing Psych: normal mood, normal thought content      Results for orders placed or performed in visit on 10/01/23  POC COVID-19   Collection Time: 10/01/23 10:43 AM  Result Value Ref Range   SARS Coronavirus 2 Ag Negative Negative   Assessment & Plan:   Acute cough Assessment & Plan: Patient was recently evaluated by colleague for the same with a negative COVID test.  Patient was show amoxicillin 875 twice daily for a week.  Recently obtain chest x-ray unofficial read shows no pneumonia pending official read.  Will treat patient with guaifenesin-codeine cough medication Tylenol 3 times daily as needed patient instructed to take this or NyQuil not both together.  Orders: -     predniSONE; Take 2 tablets (40 mg total) by mouth daily with breakfast for 3 days, THEN 1 tablet (20 mg total) daily with breakfast for 3 days. Avoid NSAIDs.  Dispense: 9 tablet; Refill: 0 -     guaiFENesin-Codeine; Take 5 mLs by mouth 3 (three)  times daily as needed for up to 8 days.  Dispense: 120 mL; Refill: 0  Chest heaviness Assessment & Plan: Will treat patient with prednisone 20 mg taper prednisone  precautions reviewed  Orders: -     predniSONE; Take 2 tablets (40 mg total) by mouth daily with breakfast for 3 days, THEN 1 tablet (20 mg total) daily with breakfast for 3 days. Avoid NSAIDs.  Dispense: 9 tablet; Refill: 0     I discussed the assessment and treatment plan with the patient. The patient was provided an opportunity to ask questions and all were answered. The patient agreed with the plan and demonstrated an understanding of the instructions. The patient was advised to call  back or seek an in-person evaluation if the symptoms worsen or if the condition fails to improve as anticipated.  Follow up plan: Return if symptoms worsen or fail to improve.  Audria Nine, NP

## 2023-10-13 NOTE — Assessment & Plan Note (Signed)
Will treat patient with prednisone 20 mg taper prednisone  precautions reviewed

## 2023-10-13 NOTE — Assessment & Plan Note (Signed)
Patient was recently evaluated by colleague for the same with a negative COVID test.  Patient was show amoxicillin 875 twice daily for a week.  Recently obtain chest x-ray unofficial read shows no pneumonia pending official read.  Will treat patient with guaifenesin-codeine cough medication Tylenol 3 times daily as needed patient instructed to take this or NyQuil not both together.

## 2023-10-22 ENCOUNTER — Telehealth: Payer: Self-pay

## 2023-10-22 ENCOUNTER — Other Ambulatory Visit (HOSPITAL_COMMUNITY): Payer: Self-pay | Admitting: Physician Assistant

## 2023-10-22 DIAGNOSIS — G4700 Insomnia, unspecified: Secondary | ICD-10-CM

## 2023-10-22 NOTE — Telephone Encounter (Signed)
 Patient was seen virtually and placed on steroids and antibiotics. She will need to be evaluated in person

## 2023-10-22 NOTE — Telephone Encounter (Signed)
 Copied from CRM [id number]. Topic: Clinical - Medical Advice >> Oct 22, 2023 10:48 AM Franky GRADE wrote: Reason for CRM: Patient was seen on 10/13/2023 and was placed on antibiotics, her symptoms have not improved and she is not feeling well. She would like to know what she should do next?

## 2023-10-23 NOTE — Telephone Encounter (Signed)
 Spoke with patient; no appts open today or Monday with any provider except today at 11 am with Carrol Aurora. Patient could not take that appt at that time but states she will call back if she is able to later if still open. Advised patient to go to UC today or this weekend if she can not be seen anywhere else and patient agreed to do so. Patient did not want any other appts at this time.

## 2023-10-26 ENCOUNTER — Other Ambulatory Visit: Payer: Self-pay | Admitting: Nurse Practitioner

## 2023-10-26 DIAGNOSIS — J90 Pleural effusion, not elsewhere classified: Secondary | ICD-10-CM

## 2023-10-26 MED ORDER — FUROSEMIDE 20 MG PO TABS: 20.0000 mg | ORAL_TABLET | Freq: Every day | ORAL | 0 refills | Status: DC

## 2023-10-28 ENCOUNTER — Other Ambulatory Visit (HOSPITAL_COMMUNITY): Payer: Self-pay | Admitting: Psychiatry

## 2023-10-28 DIAGNOSIS — G4700 Insomnia, unspecified: Secondary | ICD-10-CM

## 2023-10-28 DIAGNOSIS — F339 Major depressive disorder, recurrent, unspecified: Secondary | ICD-10-CM

## 2023-10-29 ENCOUNTER — Other Ambulatory Visit: Payer: Self-pay | Admitting: Nurse Practitioner

## 2023-11-01 ENCOUNTER — Other Ambulatory Visit (HOSPITAL_COMMUNITY): Payer: Self-pay | Admitting: Physician Assistant

## 2023-11-01 DIAGNOSIS — F411 Generalized anxiety disorder: Secondary | ICD-10-CM

## 2023-11-03 ENCOUNTER — Ambulatory Visit: Payer: Self-pay | Admitting: Nurse Practitioner

## 2023-11-03 ENCOUNTER — Telehealth: Payer: Self-pay | Admitting: Nurse Practitioner

## 2023-11-03 NOTE — Telephone Encounter (Signed)
 Pt being seen by Urgent Care. See other encounter.

## 2023-11-03 NOTE — Telephone Encounter (Signed)
 Noted I see trhat she has an appointment with Dr. Alphonsus Sias on 11-04-23

## 2023-11-03 NOTE — Telephone Encounter (Signed)
 Copied from CRM [id number]. Topic: Appointments - Scheduling Inquiry for Clinic >> Nov 03, 2023 11:22 AM Leotis ORN wrote: Reason for CRM: pts son is scheduled for tomorrow 11/04/23 at 11:30, pt is experiencing rib pain and is requesting an x ray at the same time as sons appt
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
## 2023-11-04 ENCOUNTER — Ambulatory Visit (INDEPENDENT_AMBULATORY_CARE_PROVIDER_SITE_OTHER)
Admission: RE | Admit: 2023-11-04 | Discharge: 2023-11-04 | Disposition: A | Discharge status: Home / Self Care | Payer: Commercial Managed Care - HMO | Source: Ambulatory Visit | Attending: Internal Medicine | Admitting: Internal Medicine

## 2023-11-04 ENCOUNTER — Encounter: Payer: Self-pay | Admitting: Internal Medicine

## 2023-11-04 ENCOUNTER — Ambulatory Visit: Payer: Commercial Managed Care - HMO | Admitting: Internal Medicine

## 2023-11-04 VITALS — BP 100/60 | HR 88 | Temp 97.8°F | Ht 63.0 in

## 2023-11-04 DIAGNOSIS — R0789 Other chest pain: Secondary | ICD-10-CM | POA: Insufficient documentation

## 2023-11-04 DIAGNOSIS — R0781 Pleurodynia: Secondary | ICD-10-CM | POA: Diagnosis not present

## 2023-11-04 MED ORDER — HYDROCODONE-ACETAMINOPHEN 5-325 MG PO TABS: 1.0000 | ORAL_TABLET | ORAL | 0 refills | Status: DC | PRN

## 2023-11-04 NOTE — Assessment & Plan Note (Addendum)
 After fall Either fracture or bad contusion X-rays done----- awaiting the report (and I can't tell if fracture) Discussed 1-2 ibuprofen three times a day with meals for a few days (GFR mildly down) Norco 5/325 q 4 hours prn (#20 x 0)

## 2023-11-04 NOTE — Patient Instructions (Signed)
 Try one to two 200mg  ibuprofen three times a day with food over the next few days. You can also use the prescription hydrocodone --but don't drive with this
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
## 2023-11-05 ENCOUNTER — Encounter: Admission: RE | Payer: Self-pay | Source: Home / Self Care

## 2023-11-05 ENCOUNTER — Ambulatory Visit
Admission: RE | Admit: 2023-11-05 | Payer: Commercial Managed Care - HMO | Source: Home / Self Care | Admitting: Gastroenterology

## 2023-11-05 SURGERY — COLONOSCOPY WITH PROPOFOL
Anesthesia: General

## 2023-11-06 ENCOUNTER — Telehealth (HOSPITAL_COMMUNITY): Payer: Self-pay | Admitting: *Deleted

## 2023-11-06 ENCOUNTER — Ambulatory Visit: Payer: Self-pay | Admitting: Nurse Practitioner

## 2023-11-06 NOTE — Telephone Encounter (Signed)
Attempt made to reach patient: no answer: left voicemail asking patient to return call.

## 2023-11-06 NOTE — Telephone Encounter (Signed)
Copied from CRM [id number]. Topic: Clinical - Pink Word Triage >> Nov 06, 2023  2:55 PM Fredrich Romans wrote: Reason for Triage: patient was seen for several broken ribs and prescribed    HYDROcodone-acetaminophen (NORCO/VICODIN) 5-325 MG tablet    . She called in today stating that she is still in a lot of pain and would like to know if she could have stronger pain medicine.   Chief Complaint: Rib pain Symptoms: Right rib pain Frequency: Constant, worsening  Pertinent Negatives: Patient denies fever or other symptoms Disposition: [] ED /[] Urgent Care (no appt availability in office) / [x] Appointment(In office/virtual)/ []  [place] [facility]/ [] Home Care/ [] Refused Recommended Disposition /[] [place] [facility]/ []  Follow-up with PCP Additional Notes: Patient reports she was seen for a right sided rib injury 2 days ago and that her pain has been worsening since that time. Patient reports that her pain is moderate to severe and makes it hard for her to do daily tasks. She states that she was prescribed hydrocodone-acetaminophen that she has been taking without relief. Patient wanted to know if there was anything stronger she could take for her pain. Appointment made for Monday and I advised I would forward the request to the clinic. Patient advised to go to the urgent care or the ED if her symptoms worsen. Patient understood and is agreeable with this plan.    Reason for Disposition  [1] SEVERE pain (e.g., excruciating, pain scale 8-10) AND [2] not improved after pain medications  Answer Assessment - Initial Assessment Questions 1. MAIN CONCERN OR SYMPTOM:  "What is your main concern right now?" "What question do you have?" "What's the main symptom you're worried about?" (e.g., fever, pain, redness, swelling)     Right rib pain 2. ONSET: "When did the rib pain start?"     Several days ago 3. BETTER-SAME-WORSE: "Are you getting better, staying the same, or getting worse compared to how you felt at  your last visit to the doctor (most recent medical visit)?"     Worse, taking medication without relief  4. VISIT DATE: "When were you seen?" (Date)     11/04/23 5. VISIT DOCTOR: "What is the name of the doctor taking care of you now?"     Dr. Alphonsus Sias 6. VISIT DIAGNOSIS:  "What was the main symptom or problem that you were seen for?" "Were you given a diagnosis?"       Rib pain on right side 7. VISIT TREATMENT: "What treatment did you receive?" (e.g., staples, sutures, splint, cast)     Ibuprofen and hydrocodone-acetaminophen  8. VISIT MEDICINES: "Did the doctor order any new medicines for you to use?" If Yes, ask: "Have you filled the prescription and started taking the medicine?"      Yes 9. NEXT APPOINTMENT: "Have you scheduled a follow-up appointment with your doctor?"     No 10. PAIN: "Is there any pain?" If Yes, ask: "How bad is it?"  (Scale 0-10; or mild, moderate, severe)    - NONE (0): no pain    - MILD (1-3): doesn't interfere with normal activities     - MODERATE (4-7): interferes with normal activities or awakens from sleep     - SEVERE (8-10): excruciating pain, unable to do any normal activities       Severe  11. FEVER: "Do you have a fever?" If Yes, ask: "What is it, how was it measured  and when did it start?"       No 12. OTHER  SYMPTOMS: "Do you have any other symptoms?"       No  Protocols used: Recent Medical Visit for Injury Follow-up Call-[facility]

## 2023-11-06 NOTE — Telephone Encounter (Signed)
Copied from CRM [id number]. Topic: Clinical - Pink Word Triage >> Nov 06, 2023  2:55 PM Fredrich Romans wrote: Reason for Triage: patient was seen for several broken ribs and prescribed    HYDROcodone-acetaminophen (NORCO/VICODIN) 5-325 MG tablet    . She called in today stating that she is still in a lot of pain and would like to know if she could have stronger pain medicine.

## 2023-11-06 NOTE — Telephone Encounter (Signed)
noted 

## 2023-11-06 NOTE — Telephone Encounter (Signed)
Patient called asking for refills of her psychiatric medications. Her last appointment was Nov. 2024 and she was transferred to the front desk to make her next appointment since she had a no show in Dec.

## 2023-11-09 ENCOUNTER — Ambulatory Visit: Payer: Commercial Managed Care - HMO | Admitting: Internal Medicine

## 2023-11-09 ENCOUNTER — Encounter: Payer: Self-pay | Admitting: Internal Medicine

## 2023-11-09 VITALS — BP 120/70 | HR 89 | Temp 98.6°F | Ht 63.0 in

## 2023-11-09 DIAGNOSIS — S2241XD Multiple fractures of ribs, right side, subsequent encounter for fracture with routine healing: Secondary | ICD-10-CM

## 2023-11-09 MED ORDER — OXYCODONE-ACETAMINOPHEN 5-325 MG PO TABS: 1.0000 | ORAL_TABLET | Freq: Four times a day (QID) | ORAL | 0 refills | Status: AC | PRN
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
## 2023-11-09 NOTE — Assessment & Plan Note (Signed)
Ongoing pain Hydrocodone not helping Discussed brief use of ibuprofen 600mg  tid (several days) Will Rx oxycodone 5/325 #20 ---might want to mostly take these at night (or can take 2 of the hydrocodone at a time ---like before bed)

## 2023-11-10 ENCOUNTER — Telehealth (HOSPITAL_COMMUNITY): Payer: Self-pay | Admitting: Physician Assistant

## 2023-11-12 ENCOUNTER — Other Ambulatory Visit (HOSPITAL_COMMUNITY): Payer: Self-pay | Admitting: Physician Assistant

## 2023-11-12 ENCOUNTER — Other Ambulatory Visit (HOSPITAL_COMMUNITY): Payer: Self-pay | Admitting: Psychiatry

## 2023-11-12 DIAGNOSIS — F411 Generalized anxiety disorder: Secondary | ICD-10-CM

## 2023-11-12 DIAGNOSIS — G4700 Insomnia, unspecified: Secondary | ICD-10-CM

## 2023-11-13 ENCOUNTER — Other Ambulatory Visit: Payer: Self-pay | Admitting: Nurse Practitioner

## 2023-11-19 ENCOUNTER — Telehealth (INDEPENDENT_AMBULATORY_CARE_PROVIDER_SITE_OTHER): Payer: Commercial Managed Care - HMO | Admitting: Physician Assistant

## 2023-11-19 DIAGNOSIS — F339 Major depressive disorder, recurrent, unspecified: Secondary | ICD-10-CM | POA: Diagnosis not present

## 2023-11-19 DIAGNOSIS — G4700 Insomnia, unspecified: Secondary | ICD-10-CM

## 2023-11-19 DIAGNOSIS — F1090 Alcohol use, unspecified, uncomplicated: Secondary | ICD-10-CM

## 2023-11-19 DIAGNOSIS — F411 Generalized anxiety disorder: Secondary | ICD-10-CM

## 2023-11-19 DIAGNOSIS — Z634 Disappearance and death of family member: Secondary | ICD-10-CM

## 2023-11-19 MED ORDER — TRAZODONE HCL 100 MG PO TABS: 200.0000 mg | ORAL_TABLET | Freq: Every day | ORAL | 1 refills | Status: DC

## 2023-11-19 MED ORDER — QUETIAPINE FUMARATE 400 MG PO TABS: 400.0000 mg | ORAL_TABLET | Freq: Every day | ORAL | 1 refills | Status: DC

## 2023-11-19 MED ORDER — HYDROXYZINE HCL 50 MG PO TABS: 50.0000 mg | ORAL_TABLET | Freq: Three times a day (TID) | ORAL | 1 refills | Status: DC | PRN

## 2023-11-19 MED ORDER — FLUOXETINE HCL 40 MG PO CAPS: 40.0000 mg | ORAL_CAPSULE | Freq: Every day | ORAL | 1 refills | Status: DC

## 2023-11-22 ENCOUNTER — Encounter (HOSPITAL_COMMUNITY): Payer: Self-pay | Admitting: Physician Assistant
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
## 2023-11-26 ENCOUNTER — Other Ambulatory Visit (HOSPITAL_COMMUNITY): Payer: Self-pay | Admitting: Psychiatry

## 2023-11-26 DIAGNOSIS — F339 Major depressive disorder, recurrent, unspecified: Secondary | ICD-10-CM

## 2023-11-26 DIAGNOSIS — F411 Generalized anxiety disorder: Secondary | ICD-10-CM

## 2023-12-05 ENCOUNTER — Other Ambulatory Visit: Payer: Self-pay

## 2023-12-05 ENCOUNTER — Emergency Department
Admission: EM | Admit: 2023-12-05 | Discharge: 2023-12-05 | Disposition: A | Discharge status: Home / Self Care | Payer: No Typology Code available for payment source | Attending: Emergency Medicine | Admitting: Emergency Medicine

## 2023-12-05 DIAGNOSIS — K623 Rectal prolapse: Secondary | ICD-10-CM | POA: Diagnosis present

## 2023-12-05 DIAGNOSIS — I509 Heart failure, unspecified: Secondary | ICD-10-CM | POA: Insufficient documentation

## 2023-12-05 DIAGNOSIS — I2510 Atherosclerotic heart disease of native coronary artery without angina pectoris: Secondary | ICD-10-CM | POA: Insufficient documentation

## 2023-12-05 MED ORDER — HYDROCODONE-ACETAMINOPHEN 5-325 MG PO TABS: 1.0000 | ORAL_TABLET | Freq: Four times a day (QID) | ORAL | 0 refills | Status: AC | PRN

## 2023-12-05 MED ORDER — FENTANYL CITRATE PF 50 MCG/ML IJ SOSY
50.0000 ug | PREFILLED_SYRINGE | Freq: Once | INTRAMUSCULAR | Status: AC
  Administered 2023-12-05: 50 ug via INTRAVENOUS
  Filled 2023-12-05: qty 1

## 2023-12-05 NOTE — Discharge Instructions (Signed)
I recommend taking stool softeners at least for the next week or so.   Avoid straining and heavy lifting as much as possible.  Return to the emergency department for any symptoms of concern if you are unable to see your primary care provider or the specialist.

## 2023-12-05 NOTE — ED Triage Notes (Signed)
Pt to ED POV from home for rectal prolapse since 2 days ago. States this has happened many times before and usually she can push it back in herself. Very painful to sit straight.

## 2023-12-05 NOTE — ED Provider Notes (Signed)
[facility] Provider Note    Event Date/Time   First MD Initiated Contact with Patient 12/05/23 1231     (approximate)   History   rectal prolapse   HPI  Alayia Meggison is a 63 y.o. female with history of rectal prolapse, CHF, anxiety, CAD, polysubstance abuse, colon resection and as listed in EMR presents to the emergency department for treatment and evaluation of rectal prolapse. She has recurrent prolapse, but is typically able to reduce it herself. Prolapse occurred 2 days ago and she has been unable to reduce it. Mom has applied Preparation H and Tucks medicated pads but it has not shrunk down enough to where patient can reduce it.  She has had some bright red rectal bleeding.  She denies nausea or vomiting.  She is generally uncomfortable in the abdomen but has no specific area of pain.       Physical Exam   Triage Vital Signs: ED Triage Vitals  Encounter Vitals Group     BP 12/05/23 1157 112/67     Systolic BP Percentile --      Diastolic BP Percentile --      Pulse Rate 12/05/23 1157 (!) 106     Resp 12/05/23 1157 20     Temp 12/05/23 1157 98.2 F (36.8 C)     Temp Source 12/05/23 1157 Oral     SpO2 12/05/23 1157 100 %     Weight 12/05/23 1158 95 lb (43.1 kg)     Height 12/05/23 1158 5\' 4"  (1.626 m)     Head Circumference --      Peak Flow --      Pain Score 12/05/23 1156 9     Pain Loc --      Pain Education --      Exclude from Growth Chart --     Most recent vital signs: Vitals:   12/05/23 1157  BP: 112/67  Pulse: (!) 106  Resp: 20  Temp: 98.2 F (36.8 C)  SpO2: 100%    General: Awake, no distress.  CV:  Good peripheral perfusion.  Resp:  Normal effort.  Abd:  No distention.  Other:  Grade V rectal prolapse.    ED Results / Procedures / Treatments   Labs (all labs ordered are listed, but only abnormal results are displayed) Labs Reviewed - No data to display   EKG  Not indicated.   RADIOLOGY  Image  and radiology report reviewed and interpreted by me. Radiology report consistent with the same.  Not indicated.  PROCEDURES:  Critical Care performed: No  Procedures  Rectal prolapse reduction:  Granulated sugar applied to area of prolapse.  After about 20 minutes, prolapse had decreased in size.  Reduction was successful.  No active rectal bleeding postprocedure.   MEDICATIONS ORDERED IN ED:  Medications  fentaNYL (SUBLIMAZE) injection 50 mcg (50 mcg Intravenous Given 12/05/23 1301)     IMPRESSION / MDM / ASSESSMENT AND PLAN / ED COURSE   I have reviewed the triage note.  Differential diagnosis includes, but is not limited to, partial rectal prolapse, complete rectal prolapse  Patient's presentation is most consistent with acute illness / injury with system symptoms.  63 year old female presenting to the emergency department for reduction of rectal prolapse.  See HPI for further details.  On exam, she has complete rectal prolapse with noted bright red bleeding on maxi pad.  Granulated sugar applied to the area prolapsed with significant decrease in size.  Prolapse  was reduced.  After several minutes, the patient went to the restroom and reports that she has been able to pass gas.  She reports feeling better but continues to have rectal pain.  She was advised that narcotic medications will worsen her history of IBS-C.  She is to take Tylenol or ibuprofen and was strongly encouraged to use stool softeners for the next week or so.  She is also to call and reschedule her appointment with Dr. Allegra Lai.  ER return precautions given.      FINAL CLINICAL IMPRESSION(S) / ED DIAGNOSES   Final diagnoses:  Rectal prolapse     Rx / DC Orders       Note:  This document was prepared using Dragon voice recognition software and may include unintentional dictation errors.   Chinita Pester, FNP 12/05/23 1455    Jene Every, MD 12/05/23 [phone]

## 2023-12-11 NOTE — Progress Notes (Signed)
 Sent message, via epic in basket, requesting orders in epic from Careers adviser.

## 2023-12-14 ENCOUNTER — Ambulatory Visit: Payer: Self-pay | Admitting: Student

## 2023-12-15 ENCOUNTER — Other Ambulatory Visit: Payer: Self-pay

## 2023-12-15 ENCOUNTER — Emergency Department
Admission: EM | Admit: 2023-12-15 | Discharge: 2023-12-15 | Disposition: A | Discharge status: Home / Self Care | Payer: No Typology Code available for payment source | Attending: Emergency Medicine | Admitting: Emergency Medicine

## 2023-12-15 ENCOUNTER — Emergency Department: Payer: No Typology Code available for payment source

## 2023-12-15 DIAGNOSIS — I110 Hypertensive heart disease with heart failure: Secondary | ICD-10-CM | POA: Diagnosis not present

## 2023-12-15 DIAGNOSIS — F558 Abuse of other non-psychoactive substances: Secondary | ICD-10-CM | POA: Diagnosis not present

## 2023-12-15 DIAGNOSIS — R0789 Other chest pain: Secondary | ICD-10-CM | POA: Insufficient documentation

## 2023-12-15 DIAGNOSIS — I509 Heart failure, unspecified: Secondary | ICD-10-CM | POA: Insufficient documentation

## 2023-12-15 DIAGNOSIS — I2510 Atherosclerotic heart disease of native coronary artery without angina pectoris: Secondary | ICD-10-CM | POA: Insufficient documentation

## 2023-12-15 DIAGNOSIS — Z87891 Personal history of nicotine dependence: Secondary | ICD-10-CM | POA: Insufficient documentation

## 2023-12-15 LAB — BASIC METABOLIC PANEL
Anion gap: 13 (ref 5–15)
BUN: 27 mg/dL — ABNORMAL HIGH (ref 6–20)
CO2: 19 mmol/L — ABNORMAL LOW (ref 22–32)
Calcium: 8.9 mg/dL (ref 8.9–10.3)
Chloride: 106 mmol/L (ref 98–111)
Creatinine, Ser: 0.72 mg/dL (ref 0.44–1.00)
GFR, Estimated: >60 mL/min (ref >60)
Glucose, Bld: 106 mg/dL — ABNORMAL HIGH (ref 70–99)
Potassium: 3.5 mmol/L (ref 3.5–5.1)
Sodium: 138 mmol/L (ref 135–145)

## 2023-12-15 LAB — CBC
HCT: 32.8 % — ABNORMAL LOW (ref 36.0–46.0)
Hemoglobin: 10.9 g/dL — ABNORMAL LOW (ref 12.0–15.0)
MCH: 31.8 pg (ref 26.0–34.0)
MCHC: 33.2 g/dL (ref 30.0–36.0)
MCV: 95.6 fL (ref 80.0–100.0)
Platelets: 363 10*3/uL (ref 150–400)
RBC: 3.43 MIL/uL — ABNORMAL LOW (ref 3.87–5.11)
RDW: 16.9 % — ABNORMAL HIGH (ref 11.5–15.5)
WBC: 9.5 10*3/uL (ref 4.0–10.5)
nRBC: 0.2 % (ref 0.0–0.2)

## 2023-12-15 LAB — TROPONIN I (HIGH SENSITIVITY)
Troponin I (High Sensitivity): <2 ng/L (ref <18)
Troponin I (High Sensitivity): 3 ng/L (ref <18)

## 2023-12-15 MED ORDER — PHENOBARBITAL SODIUM 65 MG/ML IJ SOLN
130.0000 mg | Freq: Once | INTRAMUSCULAR | Status: AC
  Administered 2023-12-15: 130 mg via INTRAMUSCULAR
  Filled 2023-12-15: qty 2

## 2023-12-15 NOTE — ED Triage Notes (Signed)
 Pt to ED for 8/10 mid upper chest pain since 3 days ago. Current smoker. Denies SOB anc cough. Mildly dizzy. Skin dry.

## 2023-12-15 NOTE — ED Provider Notes (Signed)
 [facility] Provider Note    Event Date/Time   First MD Initiated Contact with Patient 12/15/23 1641     (approximate)   History   Chest Pain   HPI  Carla Martinez is a 63 y.o. female who presents to the ED for evaluation of Chest Pain   Review of PCP visit from 1 month ago.  Following up after known rib fractures on the right.  History of HTN, CHF and CAD.  Smoking history. Polysubstance and alcohol abuse.  Patient presents to the ED due to chronic intermittent chest discomfort and requesting help with withdrawals from abuse of NyQuil.  Reports at least 2 years of intermittent chest discomfort without acute changes.  Reports concern that it could be her heart so she wanted to get checked out.  Also reports abuse of ethanol, wind that she has been trying to stop and so she has been replacing this with OTC cough medications, reports drinking at least a bottle of NyQuil every day and reports assistance with withdrawals.  Requests either Valium or Librium by name.   Physical Exam   Triage Vital Signs: ED Triage Vitals  Encounter Vitals Group     BP 12/15/23 1343 124/72     Systolic BP Percentile --      Diastolic BP Percentile --      Pulse Rate 12/15/23 1343 97     Resp 12/15/23 1343 20     Temp 12/15/23 1343 98 F (36.7 C)     Temp Source 12/15/23 1343 Oral     SpO2 12/15/23 1343 96 %     Weight 12/15/23 1344 94 lb 12.8 oz (43 kg)     Height 12/15/23 1344 5\' 4"  (1.626 m)     Head Circumference --      Peak Flow --      Pain Score 12/15/23 1341 8     Pain Loc --      Pain Education --      Exclude from Growth Chart --     Most recent vital signs: Vitals:   12/15/23 1700 12/15/23 1830  BP: 120/73 128/80  Pulse: 78 88  Resp: 13 17  Temp:    SpO2: 99% 96%    General: Awake, no distress.  CV:  Good peripheral perfusion.  Resp:  Normal effort.  Abd:  No distention.  MSK:  No deformity noted.  Neuro:  No focal deficits  appreciated. Other:     ED Results / Procedures / Treatments   Labs (all labs ordered are listed, but only abnormal results are displayed) Labs Reviewed  BASIC METABOLIC PANEL - Abnormal; Notable for the following components:      Result Value   CO2 19 (*)    Glucose, Bld 106 (*)    BUN 27 (*)    All other components within normal limits  CBC - Abnormal; Notable for the following components:   RBC 3.43 (*)    Hemoglobin 10.9 (*)    HCT 32.8 (*)    RDW 16.9 (*)    All other components within normal limits  TROPONIN I (HIGH SENSITIVITY)  TROPONIN I (HIGH SENSITIVITY)    EKG Sinus rhythm the rate of 99 bpm.  Normal axis and intervals.  Nonspecific changes without acute STEMI.  RADIOLOGY 2 view CXR interpreted by me with tiny right pleural effusion.  Official radiology report(s): DG Chest 2 View Result Date: 12/15/2023 CLINICAL DATA:  Chest pain. EXAM: CHEST - 2  VIEW COMPARISON:  Chest radiograph dated 11/04/2023. FINDINGS: The heart size and mediastinal contours are within normal limits. Aortic atherosclerosis. Small right pleural effusion with mild right basilar atelectasis. No focal consolidation. No pneumothorax. Subacute suspected nondisplaced right ninth and eighth rib fractures as well as fracture of the left distal clavicle are again noted. No acute osseous abnormality identified. IMPRESSION: 1. Small right pleural effusion with associated right basilar atelectasis. 2. Suspected subacute nondisplaced right ninth and eighth rib fractures as well as fracture of the left distal clavicle are again noted. Electronically Signed   By: Hart Robinsons M.D.   On: 12/15/2023 15:18    PROCEDURES and INTERVENTIONS:  .1-3 Lead EKG Interpretation  Performed by: Delton Prairie, MD Authorized by: Delton Prairie, MD     Interpretation: normal     ECG rate:  74   ECG rate assessment: normal     Rhythm: sinus rhythm     Ectopy: none     Conduction: normal     Medications   PHENObarbital (LUMINAL) injection 130 mg (has no administration in time range)     IMPRESSION / MDM / ASSESSMENT AND PLAN / ED COURSE  I reviewed the triage vital signs and the nursing notes.  Differential diagnosis includes, but is not limited to, ACS, PTX, PNA, muscle strain/spasm, PE, dissection, anxiety, pleural effusion  {Patient presents with symptoms of an acute illness or injury that is potentially life-threatening.  Patient presents with chronic atypical intermittent chest discomfort with a benign workup and suitable for outpatient management.  Requesting assistance with withdrawals from abuse of NyQuil.  Typically an alcoholic.  We will provide a dose of phenobarbital.  2 negative troponins, normal CBC and metabolic panel.  Asymptomatic right now.  Suitable for outpatient management.  I considered observation admission.  No indications for emergent psychiatric evaluation or IVC      FINAL CLINICAL IMPRESSION(S) / ED DIAGNOSES   Final diagnoses:  Abuse of cough or cold remedies  Other chest pain     Rx / DC Orders   ED Discharge Orders     None        Note:  This document was prepared using Dragon voice recognition software and may include unintentional dictation errors.   Delton Prairie, MD 12/15/23 [phone]

## 2023-12-16 ENCOUNTER — Other Ambulatory Visit: Payer: Self-pay

## 2023-12-16 ENCOUNTER — Telehealth: Payer: Self-pay

## 2023-12-16 ENCOUNTER — Other Ambulatory Visit: Payer: Self-pay | Admitting: Gastroenterology

## 2023-12-16 DIAGNOSIS — Z860100 Personal history of colon polyps, unspecified: Secondary | ICD-10-CM

## 2023-12-16 DIAGNOSIS — K219 Gastro-esophageal reflux disease without esophagitis: Secondary | ICD-10-CM

## 2023-12-16 MED ORDER — NA SULFATE-K SULFATE-MG SULF 17.5-3.13-1.6 GM/177ML PO SOLN: 354.0000 mL | Freq: Once | ORAL | 0 refills | Status: AC

## 2023-12-16 NOTE — Telephone Encounter (Signed)
 Patient states she was seen in the ER for rectal prolapse. She states she went because was having several days of rectal bleeding that would not stop. She wants to know does she need to reschedule her colonoscopy or does she need to be seen?

## 2023-12-16 NOTE — Telephone Encounter (Signed)
 Called and got patient schedule for procedures for 01/13/2024 with Dr. Allegra Lai. Sent prep to the pharmacy. Went over instructions, mailed them and sent to [employer]

## 2023-12-17 ENCOUNTER — Emergency Department
Admission: EM | Admit: 2023-12-17 | Discharge: 2023-12-18 | Disposition: A | Discharge status: Home / Self Care | Payer: No Typology Code available for payment source | Attending: Emergency Medicine | Admitting: Emergency Medicine

## 2023-12-17 ENCOUNTER — Other Ambulatory Visit: Payer: Self-pay

## 2023-12-17 ENCOUNTER — Emergency Department: Payer: No Typology Code available for payment source

## 2023-12-17 ENCOUNTER — Telehealth (HOSPITAL_COMMUNITY): Payer: Self-pay

## 2023-12-17 DIAGNOSIS — Y906 Blood alcohol level of 120-199 mg/100 ml: Secondary | ICD-10-CM | POA: Diagnosis not present

## 2023-12-17 DIAGNOSIS — F10920 Alcohol use, unspecified with intoxication, uncomplicated: Secondary | ICD-10-CM

## 2023-12-17 DIAGNOSIS — Y92009 Unspecified place in unspecified non-institutional (private) residence as the place of occurrence of the external cause: Secondary | ICD-10-CM | POA: Diagnosis not present

## 2023-12-17 DIAGNOSIS — W19XXXA Unspecified fall, initial encounter: Secondary | ICD-10-CM | POA: Diagnosis not present

## 2023-12-17 DIAGNOSIS — F10129 Alcohol abuse with intoxication, unspecified: Secondary | ICD-10-CM | POA: Insufficient documentation

## 2023-12-17 LAB — CBC
HCT: 35.4 % — ABNORMAL LOW (ref 36.0–46.0)
Hemoglobin: 12.2 g/dL (ref 12.0–15.0)
MCH: 33.3 pg (ref 26.0–34.0)
MCHC: 34.5 g/dL (ref 30.0–36.0)
MCV: 96.7 fL (ref 80.0–100.0)
Platelets: 348 10*3/uL (ref 150–400)
RBC: 3.66 MIL/uL — ABNORMAL LOW (ref 3.87–5.11)
RDW: 16.4 % — ABNORMAL HIGH (ref 11.5–15.5)
WBC: 7.6 10*3/uL (ref 4.0–10.5)
nRBC: 0 % (ref 0.0–0.2)

## 2023-12-17 LAB — COMPREHENSIVE METABOLIC PANEL
ALT: 37 U/L (ref 0–44)
AST: 36 U/L (ref 15–41)
Albumin: 3.8 g/dL (ref 3.5–5.0)
Alkaline Phosphatase: 94 U/L (ref 38–126)
Anion gap: 15 (ref 5–15)
BUN: 18 mg/dL (ref 6–20)
CO2: 27 mmol/L (ref 22–32)
Calcium: 9.4 mg/dL (ref 8.9–10.3)
Chloride: 101 mmol/L (ref 98–111)
Creatinine, Ser: 0.72 mg/dL (ref 0.44–1.00)
GFR, Estimated: >60 mL/min (ref >60)
Glucose, Bld: 99 mg/dL (ref 70–99)
Potassium: 2.9 mmol/L — ABNORMAL LOW (ref 3.5–5.1)
Sodium: 143 mmol/L (ref 135–145)
Total Bilirubin: 0.3 mg/dL (ref 0.0–1.2)
Total Protein: 6.7 g/dL (ref 6.5–8.1)

## 2023-12-17 LAB — MAGNESIUM: Magnesium: 1.6 mg/dL — ABNORMAL LOW (ref 1.7–2.4)

## 2023-12-17 LAB — ETHANOL: Alcohol, Ethyl (B): 194 mg/dL — ABNORMAL HIGH (ref <10)

## 2023-12-17 MED ORDER — DROPERIDOL 2.5 MG/ML IJ SOLN
2.5000 mg | Freq: Once | INTRAMUSCULAR | Status: AC
  Administered 2023-12-17: 2.5 mg via INTRAVENOUS
  Filled 2023-12-17: qty 2

## 2023-12-17 MED ORDER — POTASSIUM CHLORIDE CRYS ER 20 MEQ PO TBCR
60.0000 meq | EXTENDED_RELEASE_TABLET | Freq: Once | ORAL | Status: AC
  Administered 2023-12-17: 60 meq via ORAL
  Filled 2023-12-17: qty 3

## 2023-12-17 MED ORDER — ONDANSETRON HCL 4 MG/2ML IJ SOLN: 4.0000 mg | Freq: Once | INTRAMUSCULAR | Status: AC

## 2023-12-17 MED ORDER — ONDANSETRON HCL 4 MG/2ML IJ SOLN
INTRAMUSCULAR | Status: AC
Start: 2023-12-17 — End: 2023-12-17
  Administered 2023-12-17: 4 mg via INTRAVENOUS
  Filled 2023-12-17: qty 2

## 2023-12-17 NOTE — ED Triage Notes (Signed)
 Patient B[facility] from home, per ems fall at home with etoh on board, patient denies pain, no loc

## 2023-12-17 NOTE — Telephone Encounter (Signed)
 Pt is requesting to speak with provider in regards to a refill of all her medications and states that she is also inquiring on "a FCC". Was not sure what Pt ment by this

## 2023-12-17 NOTE — ED Provider Notes (Signed)
 [facility] Provider Note    Event Date/Time   First MD Initiated Contact with Patient 12/17/23 2222     (approximate)  History   Chief Complaint: Alcohol Intoxication  HPI  Carla Martinez is a 63 y.o. female with a past medical history of alcohol use disorder, anxiety, gastric reflux, presents to the emergency department after a fall.  According to EMS patient had a fall at home, patient's family member called EMS per patient.  Here patient denies any pain.  She does not want any medical exam.  Is asking me not to talk to her.  I was able to talk to the patient briefly she was able to move all extremities denied any pain doing so was able to follow commands.  States she just wants to go home and pulled the blanket over her head so she could sleep per patient.  No signs of any head trauma on exam, at least on the exam that she will allow.  Patient is awake alert speaking in full sentences.  Physical Exam   Triage Vital Signs: ED Triage Vitals  Encounter Vitals Group     BP 12/17/23 2159 123/71     Systolic BP Percentile --      Diastolic BP Percentile --      Pulse Rate 12/17/23 2159 (!) 102     Resp 12/17/23 2159 18     Temp 12/17/23 2159 98.1 F (36.7 C)     Temp Source 12/17/23 2159 Oral     SpO2 12/17/23 2159 96 %     Weight 12/17/23 2207 94 lb 12.8 oz (43 kg)     Height 12/17/23 2207 5\' 4"  (1.626 m)     Head Circumference --      Peak Flow --      Pain Score 12/17/23 2207 0     Pain Loc --      Pain Education --      Exclude from Growth Chart --     Most recent vital signs: Vitals:   12/17/23 2159  BP: 123/71  Pulse: (!) 102  Resp: 18  Temp: 98.1 F (36.7 C)  SpO2: 96%    General: Patient is awake, no distress.  Patient denies any complaints and states she wants to go home. CV:  Good peripheral perfusion.  Regular rate and rhythm  Resp:  Normal effort.  Equal breath sounds bilaterally.  Abd:  No distention.  Soft, nontender.   No rebound or guarding. Other:  Patient able to move all extremities well.  ED Results / Procedures / Treatments   MEDICATIONS ORDERED IN ED: Medications  ondansetron (ZOFRAN) injection 4 mg (has no administration in time range)     IMPRESSION / MDM / ASSESSMENT AND PLAN / ED COURSE  I reviewed the triage vital signs and the nursing notes.  Patient's presentation is most consistent with acute presentation with potential threat to life or bodily function.  Patient presents to the emergency department, per EMS alcohol intoxication and a fall at home.  Here the patient denies any pain, states she wants to go home.  Does not want any imaging performed.  Given the fall I did recommend getting a CT scan, patient states she does not need 1 or want 1.  Shortly after I spoke to the patient she did vomit.  We will check labs including a CBC chemistry and an alcohol level.  Will attempt to obtain a CT scan if the patient allows.  However she is awake alert she is speaking in full sentences.  We will continue to closely monitor while awaiting results.  FINAL CLINICAL IMPRESSION(S) / ED DIAGNOSES   Alcohol intoxication Fall   Note:  This document was prepared using Dragon voice recognition software and may include unintentional dictation errors.   Minna Antis, MD 12/17/23 2239

## 2023-12-18 ENCOUNTER — Encounter (HOSPITAL_COMMUNITY)
Admission: RE | Admit: 2023-12-18 | Discharge: 2023-12-18 | Disposition: A | Discharge status: Home / Self Care | Payer: No Typology Code available for payment source | Source: Ambulatory Visit | Attending: Nurse Practitioner | Admitting: Nurse Practitioner

## 2023-12-18 MED ORDER — DIAZEPAM 5 MG PO TABS
5.0000 mg | ORAL_TABLET | Freq: Once | ORAL | Status: AC
  Administered 2023-12-18: 5 mg via ORAL
  Filled 2023-12-18: qty 1

## 2023-12-18 NOTE — ED Notes (Signed)
 Pt requesting beverage at this time. Provider asked to make sure pt is cleared to eat/drink.

## 2023-12-18 NOTE — ED Provider Notes (Addendum)
 Patient comfortable and sober on reevaluation.   CT head which was pending is read as negative.  Her potassium was slightly low so she got repletion in the emergency department.  Discharge.  No signs of acute alcohol withdrawal at this time though she feels anxious, she is calm without tremors or tongue fasciculation when I examine her.  Will give a p.o. Valium for anxiety while she awaits a ride in the waiting room.Pilar Jarvis, MD 12/18/23 [phone]

## 2023-12-18 NOTE — ED Notes (Signed)
 Pt discharged to waiting room at this time and left with all belongings. Pt ABCs intact. RR even and unlabored. Pt in NAD. Pt denies further needs from this RN. Pt waiting for transportation home.

## 2023-12-23 ENCOUNTER — Telehealth: Payer: Self-pay

## 2023-12-23 NOTE — Telephone Encounter (Signed)
 noted

## 2023-12-23 NOTE — Transitions of Care (Post Inpatient/ED Visit) (Signed)
 I spoke with pt; pt was seen [facility] ED on 12/18/23 after a fall at home. I asked pt if she had any injury from the fall and she said no. Pt did not want to review her regular med list because she said the ED did not give her med to take home and she would talk with Susy Frizzle about all my questions. Pt did say she was not in distress but needed to be seen this week. I explained I needed some idea of urgency to be seen she said she had the jitters and could not wait until next wk. Matt oked to see pt on 12/24/23 at 4 PM if she was unable to wait until next wk to be seen. UC & ED precautions given and pt voiced understanding. Sending note to Audria Nine NP.        12/23/2023  Name: Carla Martinez MRN: [id number] DOB: 1961/05/24  Today's TOC FU Call Status: Today's TOC FU Call Status:: Successful TOC FU Call Completed TOC FU Call Complete Date: 12/23/23 Patient's Name and Date of Birth confirmed.  Transition Care Management Follow-up Telephone Call Date of Discharge: 12/18/23 Discharge Facility: [facility]) Type of Discharge: Emergency Department Reason for ED Visit: Other: (pt fell at home and pt does not have any pain but went to [facility] ED.) How have you been since you were released from the hospital?: Better Any questions or concerns?: No  Items Reviewed: Did you receive and understand the discharge instructions provided?: Yes Medications obtained,verified, and reconciled?: No Medications Not Reviewed Reasons:: Other: (pt said was not given med to bring home and wants to [facility] with doctor when comes in for appt.) Any new allergies since your discharge?: No Dietary orders reviewed?: NA Do you have support at home?: Yes People in Home: parent(s) Name of Support/Comfort Primary Source: pts mom  Medications Reviewed Today: Medications Reviewed Today   Medications were not reviewed in this encounter     Home Care and Equipment/Supplies: Were Home Health  Services Ordered?: NA Any new equipment or medical supplies ordered?: NA  Functional Questionnaire: Do you need assistance with bathing/showering or dressing?: No Do you need assistance with meal preparation?: No Do you need assistance with eating?: No Do you have difficulty maintaining continence: No Do you need assistance with getting out of bed/getting out of a chair/moving?: No Do you have difficulty managing or taking your medications?: No  Follow up appointments reviewed: PCP Follow-up appointment confirmed?: Yes Date of PCP follow-up appointment?: 12/24/23 Follow-up Provider: Audria Nine NP Specialist Hospital Follow-up appointment confirmed?: NA Do you need transportation to your follow-up appointment?: No Do you understand care options if your condition(s) worsen?: Yes-patient verbalized understanding    SIGNATURE  Lewanda Rife, LPN

## 2023-12-24 ENCOUNTER — Ambulatory Visit (INDEPENDENT_AMBULATORY_CARE_PROVIDER_SITE_OTHER): Admitting: Nurse Practitioner

## 2023-12-24 ENCOUNTER — Telehealth: Payer: Self-pay

## 2023-12-24 ENCOUNTER — Telehealth: Payer: Self-pay | Admitting: Nurse Practitioner

## 2023-12-24 VITALS — BP 106/74 | HR 73 | Temp 98.0°F | Ht 64.0 in | Wt 97.2 lb

## 2023-12-24 DIAGNOSIS — F1090 Alcohol use, unspecified, uncomplicated: Secondary | ICD-10-CM | POA: Diagnosis not present

## 2023-12-24 NOTE — Progress Notes (Signed)
 Acute Office Visit  Subjective:     Patient ID: Carla Martinez, female    DOB: 1961-04-29, 63 y.o.   MRN: [id number]  Chief Complaint  Patient presents with   Follow-up    Pt complains that she's been ok since ER visit.  Pt states that she is very jittery. States she is trying to stay sober.     HPI Patient is in today for hospital follow up   Patient was seen in the emergency department twice once on 12/15/2023 and then 12/17/2023  States that she feels like she is doing better. States that she has been sober for 3 days. Statse that when she gets jittery or worked up she wants to   [place] that she is out of her prozac 40mg  daily she thought it was twice a day. She has been out of the prozac for approx 1 week.  States that she has withdrawals in the past. She has had seizures in the past. States that she has done rehab in the past and does not want to do that every again   States that she has only had 2 doses of cough medication doses today. States that she uses it to help her stay calm. States that she is taking the hydroxyzine 50mg  three times a day   State that she was drinking 4 single serve wine bottles.   Review of Systems  Constitutional:  Negative for chills and fever.  Respiratory:  Negative for shortness of breath.   Cardiovascular:  Negative for chest pain.  Neurological:  Negative for headaches.  Psychiatric/Behavioral:  Negative for hallucinations and suicidal ideas.         Objective:    BP 106/74   Pulse 73   Temp 98 F (36.7 C) (Oral)   Ht 5\' 4"  (1.626 m)   Wt 97 lb 3.2 oz (44.1 kg)   SpO2 93%   BMI 16.68 kg/m  BP Readings from Last 3 Encounters:  12/24/23 106/74  12/17/23 136/74  12/15/23 128/80   Wt Readings from Last 3 Encounters:  12/24/23 97 lb 3.2 oz (44.1 kg)  12/17/23 94 lb 12.8 oz (43 kg)  12/15/23 94 lb 12.8 oz (43 kg)   SpO2 Readings from Last 3 Encounters:  12/24/23 93%  12/17/23 100%  12/15/23 96%      Physical  Exam Vitals and nursing note reviewed.  Constitutional:      Appearance: Normal appearance.  Cardiovascular:     Rate and Rhythm: Normal rate and regular rhythm.     Heart sounds: Normal heart sounds.  Pulmonary:     Effort: Pulmonary effort is normal.     Breath sounds: Normal breath sounds.  Abdominal:     General: Bowel sounds are normal.  Neurological:     Mental Status: She is alert.     No results found for any visits on 12/24/23.      Assessment & Plan:   Problem List Items Addressed This Visit       Other   Alcohol use disorder - Primary   History of the same.  Has talked to me in the past about it and stopped her behavioral health specialist in the past about it Ambien in the emergency department twice in regards to this.  I do not feel comfortable giving patient Librium for at home use with her using cough medication appropriately.  Did offer patient rehab she was adamant about not going back to rehab as she has  tried it several times.  Patient's CIWA scale is a 4 in office today.  Patient is not having active SI or HI.  I will message patient's behavioral health specialist to see if they are comfortable with prescribing close medications to help patient abstain from alcohol.  States that she has been sober for 3 days.  Has been using cough medication today states she has had 2 doses of       No orders of the defined types were placed in this encounter.   Return if symptoms worsen or fail to improve.  Audria Nine, NP

## 2023-12-24 NOTE — Telephone Encounter (Signed)
 Dr. Allegra Lai is not going to be in the office on 01/13/2024 and we are needing to reschedule her colonoscopy to a different day. Called patient and reschedule patient to 01/28/2024 with Dr. Allegra Lai. Called endo and talk to vickie and she will get patient reschedule

## 2023-12-24 NOTE — Patient Instructions (Signed)
 Nice to see you today I will reach out to your behavioral health specialist Continue working on not drinking and reducing the use of the cold medications

## 2023-12-24 NOTE — Telephone Encounter (Addendum)
 I saw Carla Martinez in office today. She is wanting something to help keep her from drinking. She wants something to help calm her nerve. I told her that I would defer to you guys. I see that you talk to her about her alcohol dependence. Let me know if I can help in any way  She also mentions that she is taking prozac 40mg  BID and is out of medication and is requesting a refill   Matt C

## 2023-12-24 NOTE — Assessment & Plan Note (Signed)
 History of the same.  Has talked to me in the past about it and stopped her behavioral health specialist in the past about it Ambien in the emergency department twice in regards to this.  I do not feel comfortable giving patient Librium for at home use with her using cough medication appropriately.  Did offer patient rehab she was adamant about not going back to rehab as she has tried it several times.  Patient's CIWA scale is a 4 in office today.  Patient is not having active SI or HI.  I will message patient's behavioral health specialist to see if they are comfortable with prescribing close medications to help patient abstain from alcohol.  States that she has been sober for 3 days.  Has been using cough medication today states she has had 2 doses of

## 2023-12-30 ENCOUNTER — Ambulatory Visit (HOSPITAL_COMMUNITY): Admit: 2023-12-30 | Payer: No Typology Code available for payment source | Admitting: Orthopedic Surgery

## 2023-12-30 SURGERY — HARDWARE REMOVAL
Anesthesia: Spinal | Laterality: Right

## 2023-12-31 ENCOUNTER — Encounter (HOSPITAL_COMMUNITY): Payer: Self-pay | Admitting: Physician Assistant

## 2023-12-31 ENCOUNTER — Telehealth (INDEPENDENT_AMBULATORY_CARE_PROVIDER_SITE_OTHER): Payer: Self-pay | Admitting: Physician Assistant

## 2023-12-31 DIAGNOSIS — Z634 Disappearance and death of family member: Secondary | ICD-10-CM

## 2023-12-31 DIAGNOSIS — G4700 Insomnia, unspecified: Secondary | ICD-10-CM

## 2023-12-31 DIAGNOSIS — F411 Generalized anxiety disorder: Secondary | ICD-10-CM | POA: Diagnosis not present

## 2023-12-31 DIAGNOSIS — F339 Major depressive disorder, recurrent, unspecified: Secondary | ICD-10-CM | POA: Diagnosis not present

## 2023-12-31 DIAGNOSIS — F1090 Alcohol use, unspecified, uncomplicated: Secondary | ICD-10-CM | POA: Diagnosis not present

## 2023-12-31 MED ORDER — TRAZODONE HCL 100 MG PO TABS: 200.0000 mg | ORAL_TABLET | Freq: Every day | ORAL | 1 refills | Status: DC

## 2023-12-31 MED ORDER — QUETIAPINE FUMARATE 50 MG PO TABS: 50.0000 mg | ORAL_TABLET | Freq: Every day | ORAL | 1 refills | Status: DC

## 2023-12-31 MED ORDER — FLUOXETINE HCL 20 MG PO CAPS
ORAL_CAPSULE | ORAL | 0 refills | Status: DC
Start: 2023-12-31 — End: 2024-01-22

## 2023-12-31 MED ORDER — GABAPENTIN 300 MG PO CAPS
300.0000 mg | ORAL_CAPSULE | Freq: Three times a day (TID) | ORAL | 1 refills | Status: DC
Start: 2023-12-31 — End: 2024-01-26

## 2023-12-31 MED ORDER — HYDROXYZINE HCL 50 MG PO TABS: 50.0000 mg | ORAL_TABLET | Freq: Three times a day (TID) | ORAL | 1 refills | Status: DC | PRN

## 2023-12-31 MED ORDER — FLUOXETINE HCL 40 MG PO CAPS
40.0000 mg | ORAL_CAPSULE | Freq: Every day | ORAL | 1 refills | Status: DC
Start: 2024-01-30 — End: 2024-01-26

## 2023-12-31 MED ORDER — QUETIAPINE FUMARATE 400 MG PO TABS: 400.0000 mg | ORAL_TABLET | Freq: Every day | ORAL | 1 refills | Status: DC
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
## 2024-01-04 ENCOUNTER — Ambulatory Visit: Payer: Self-pay | Admitting: Nurse Practitioner

## 2024-01-04 NOTE — Telephone Encounter (Signed)
 Copied from CRM [id number]. Topic: Clinical - Red Word Triage >> Jan 04, 2024 12:14 PM Kathryne Eriksson wrote: Red Word that prompted transfer to Nurse Triage: Severe Pain >> Jan 04, 2024 12:15 PM Kathryne Eriksson wrote: Patient states she has some broken ribs (right side).    Chief Complaint: Right sided rib pain (chest) Symptoms: pain Frequency: x 2 months Pertinent Negatives: Patient denies dizziness, nausea, vomiting, sweating, fever, difficulty breathing, cough, swelling, bruising, new recent injuries. Disposition: [] ED /[] Urgent Care (no appt availability in office) / [x] Appointment(In office/virtual)/ []  [facility]/ [] Home Care/ [] Refused Recommended Disposition /[] [place] [facility]/ []  Follow-up with PCP Additional Notes: Patient called and advised that she broke several ribs on her right side.  She states that she is still having a lot of pain. Patient was seen for this 11/04/2023.   Patient denies any dizziness, nausea, vomiting, sweating, fever, difficulty breathing, cough, swelling, bruising, or any new recent injuries. She states that certain movements hurt and if she lays on her right side it hurts. Patient states that since it is still very sore, she wanted to have it reassessed at this time. Patient is given Care Advice as per protocol and appointment made for Wednesday March 19th at 4pm with patient's PCP Dr Mordecai Maes. Patient is also advised that if anything gets worse to go immediately to the Emergency Room.  Patient verbalized understanding.    Reason for Disposition  [1] Chest wall swelling or pain AND [2] present > 7 days  Answer Assessment - Initial Assessment Questions 1. LOCATION: "Where does it hurt?"       Right side--front to the side and to the back 2. RADIATION: "Does the pain go anywhere else?" (e.g., into neck, jaw, arms, back)     Front to side to back 3. ONSET: "When did the chest pain begin?" (Minutes, hours or days)      11/04/2023 after a  fall 4. PATTERN: "Does the pain come and go, or has it been constant since it started?"  "Does it get worse with exertion?"      Constant  but a lot worse with any movements 5. DURATION: "How long does it last" (e.g., seconds, minutes, hours)     constant 6. SEVERITY: "How bad is the pain?"  (e.g., Scale 1-10; mild, moderate, or severe)    - MILD (1-3): doesn't interfere with normal activities     - MODERATE (4-7): interferes with normal activities or awakens from sleep    - SEVERE (8-10): excruciating pain, unable to do any normal activities       7-- it is really bothering her 7. CARDIAC RISK FACTORS: "Do you have any history of heart problems or risk factors for heart disease?" (e.g., angina, prior heart attack; diabetes, high blood pressure, high cholesterol, smoker, or strong family history of heart disease)     No 8. PULMONARY RISK FACTORS: "Do you have any history of lung disease?"  (e.g., blood clots in lung, asthma, emphysema, birth control pills)     No 9. CAUSE: "What do you think is causing the chest pain?"     Broke ribs possibly 10. OTHER SYMPTOMS: "Do you have any other symptoms?" (e.g., dizziness, nausea, vomiting, sweating, fever, difficulty breathing, cough)       No  Answer Assessment - Initial Assessment Questions 1. MECHANISM: "How did the injury happen?"     Fall back in January 2. ONSET: "When did the injury happen?" (Minutes or hours ago)  January 3. LOCATION: "Where on the chest is the injury located?"     Right side 4. APPEARANCE: "What does the injury look like?"     Normal skin appearance  5. BLEEDING: "Is there any bleeding now? If Yes, ask: How long has it been bleeding?"     No 6. SEVERITY: "Any difficulty with breathing?"     No 7. SIZE: For cuts, bruises, or swelling, ask: "How large is it?" (e.g., inches or centimeters)     No 8. PAIN: "Is there pain?" If Yes, ask: "How bad is the pain?"   (e.g., Scale 1-10; or mild, moderate, severe)     7 9.  TETANUS: For any breaks in the skin, ask: "When was the last tetanus booster?"     N/a  Protocols used: Chest Pain-A-AH, Chest Injury-A-AH

## 2024-01-04 NOTE — Telephone Encounter (Signed)
 Noted. Will eval in office

## 2024-01-05 ENCOUNTER — Telehealth (HOSPITAL_COMMUNITY): Payer: Self-pay | Admitting: Physician Assistant

## 2024-01-05 NOTE — Telephone Encounter (Signed)
 Message acknowledged and reviewed.

## 2024-01-05 NOTE — Telephone Encounter (Signed)
 Message acknowledged and reviewed. Patient has an appointment scheduled with [facility].

## 2024-01-05 NOTE — Telephone Encounter (Signed)
 Message acknowledged and reviewed.  Thank you for the message.  I recently spoke to patient regarding her alcohol dependence during her last encounter.  We discussed placing her on Vivitrol (a long-acting injectable) to combat her alcohol dependence.  She has her injection scheduled for 01/07/2024.  I was also able to refill patient's Prozac medication during her last encounter.

## 2024-01-06 ENCOUNTER — Encounter: Payer: Self-pay | Admitting: Nurse Practitioner

## 2024-01-06 ENCOUNTER — Ambulatory Visit (INDEPENDENT_AMBULATORY_CARE_PROVIDER_SITE_OTHER): Admitting: Nurse Practitioner

## 2024-01-06 VITALS — BP 120/68 | HR 104 | Temp 98.1°F | Ht 64.0 in

## 2024-01-06 DIAGNOSIS — R197 Diarrhea, unspecified: Secondary | ICD-10-CM | POA: Diagnosis not present

## 2024-01-06 DIAGNOSIS — R112 Nausea with vomiting, unspecified: Secondary | ICD-10-CM | POA: Diagnosis not present

## 2024-01-06 DIAGNOSIS — S2231XS Fracture of one rib, right side, sequela: Secondary | ICD-10-CM | POA: Diagnosis not present

## 2024-01-06 DIAGNOSIS — R52 Pain, unspecified: Secondary | ICD-10-CM | POA: Diagnosis not present

## 2024-01-06 HISTORY — DX: Nausea with vomiting, unspecified: R11.2

## 2024-01-06 LAB — POC COVID19 BINAXNOW: SARS Coronavirus 2 Ag: NEGATIVE

## 2024-01-06 LAB — POCT FLU A/B STATUS
Influenza A, POC: NEGATIVE
Influenza B, POC: NEGATIVE

## 2024-01-06 MED ORDER — PROMETHAZINE HCL 12.5 MG PO TABS: 12.5000 mg | ORAL_TABLET | Freq: Three times a day (TID) | ORAL | 0 refills | Status: DC | PRN

## 2024-01-06 MED ORDER — MELOXICAM 15 MG PO TABS: 15.0000 mg | ORAL_TABLET | Freq: Every day | ORAL | 0 refills | Status: DC

## 2024-01-06 NOTE — Patient Instructions (Signed)
 Nice to see you today I will be in touch with the labs once I have them Follow up if you do not improve Try and stay hydrated The nausea medication can cause sedation so use caution   Get OTC lidocaine patches to help with the rib pain
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
## 2024-01-06 NOTE — Assessment & Plan Note (Signed)
 Flu and COVID test in office.  Patient to rest drink plenty of fluids advance diet as tolerated pending CBC and CMP.

## 2024-01-06 NOTE — Assessment & Plan Note (Signed)
 History of the same.  Been seen multiple times.  Did review most recent chest x-ray done in the emergency department.  It showed subacute.  Will do a short course of anti-inflammatories along with lidocaine topical patches.  Likely exacerbated with the vomiting

## 2024-01-06 NOTE — Assessment & Plan Note (Signed)
 Flu and COVID test in office.  Patient to advance diet as tolerates try to drink plenty of fluids pending labs today.  Promethazine 12.5 mg 3 times daily as needed sedation precautions reviewed.  Avoid other antiemetics given nature medications of prolonged QTc

## 2024-01-06 NOTE — Assessment & Plan Note (Signed)
Flu and COVID test in office.  Over-the-counter analgesics as needed

## 2024-01-07 ENCOUNTER — Other Ambulatory Visit (HOSPITAL_COMMUNITY): Payer: Self-pay | Admitting: Physician Assistant

## 2024-01-07 ENCOUNTER — Ambulatory Visit (INDEPENDENT_AMBULATORY_CARE_PROVIDER_SITE_OTHER)

## 2024-01-07 ENCOUNTER — Encounter (HOSPITAL_COMMUNITY): Payer: Self-pay

## 2024-01-07 VITALS — BP 118/70 | HR 88 | Resp 16 | Ht 64.0 in | Wt 97.4 lb

## 2024-01-07 DIAGNOSIS — F1021 Alcohol dependence, in remission: Secondary | ICD-10-CM | POA: Diagnosis not present

## 2024-01-07 DIAGNOSIS — F1090 Alcohol use, unspecified, uncomplicated: Secondary | ICD-10-CM

## 2024-01-07 LAB — COMPREHENSIVE METABOLIC PANEL
ALT: 18 U/L (ref 0–35)
AST: 21 U/L (ref 0–37)
Albumin: 4.5 g/dL (ref 3.5–5.2)
Alkaline Phosphatase: 105 U/L (ref 39–117)
BUN: 17 mg/dL (ref 6–23)
CO2: 26 meq/L (ref 19–32)
Calcium: 9.4 mg/dL (ref 8.4–10.5)
Chloride: 100 meq/L (ref 96–112)
Creatinine, Ser: 0.94 mg/dL (ref 0.40–1.20)
GFR: 66.06 mL/min (ref >60.00)
Glucose, Bld: 125 mg/dL — ABNORMAL HIGH (ref 70–99)
Potassium: 3.9 meq/L (ref 3.5–5.1)
Sodium: 138 meq/L (ref 135–145)
Total Bilirubin: 0.4 mg/dL (ref 0.2–1.2)
Total Protein: 7.1 g/dL (ref 6.0–8.3)

## 2024-01-07 LAB — CBC
HCT: 41.7 % (ref 36.0–46.0)
Hemoglobin: 13.4 g/dL (ref 12.0–15.0)
MCHC: 32.3 g/dL (ref 30.0–36.0)
MCV: 100 fl (ref 78.0–100.0)
Platelets: 354 10*3/uL (ref 150.0–400.0)
RBC: 4.17 Mil/uL (ref 3.87–5.11)
RDW: 14.4 % (ref 11.5–15.5)
WBC: 12.9 10*3/uL — ABNORMAL HIGH (ref 4.0–10.5)

## 2024-01-07 LAB — LIPASE: Lipase: 11 U/L (ref 11.0–59.0)

## 2024-01-07 MED ORDER — NALTREXONE 380 MG IM SUSR
380.0000 mg | Freq: Once | INTRAMUSCULAR | Status: AC
  Administered 2024-01-07: 380 mg via INTRAMUSCULAR

## 2024-01-07 NOTE — Progress Notes (Signed)
 Patient arrived for her injection of Vivitrol 380mg . This is her first visit and she is excited to get help for her alcohol use. Patient denies using any opioids and states she only uses alcohol. Denies SI/HI or AV hallucinations. No issues or complaints. Sample of medication was used today. Half given in Right gluteal, all medication was unable to be pushed through into the muscle. Needle was withdrawn and then changed to a new needle, rest of the medication was given in Left Gluteal without issue or complaint. Patient is very small with very small gluteal muscle. Patient did sit for 10 minutes after injection to ensure there would be no side effects. Patient asked to leave after 10 minutes stating that her 61 year old mother and disabled son was waiting for her in the car. Walked out to make appointment in 1 month for next injection. Sample medication:  [id number] Lot #[id number] Exp: [id number]

## 2024-01-07 NOTE — Progress Notes (Signed)
 Patient receiving Vivtrol (Naltrexone SUSR 380 mg long acting injectable) for the management of her alcohol dependence.

## 2024-01-08 ENCOUNTER — Encounter: Payer: Self-pay | Admitting: Nurse Practitioner

## 2024-01-11 ENCOUNTER — Telehealth (HOSPITAL_COMMUNITY): Payer: Self-pay

## 2024-01-11 NOTE — Telephone Encounter (Signed)
 Hello,   Pt states that she came to get an injection on Friday (which I don't recall because I only gave one injection that was a female, unless another Healthcare team member did.)  However, Pt states that she thought that this medication would help with withdraws and cravings, yet she states this is the worst she has ever been and would like a phone call with provider.   JNL, CMA

## 2024-01-14 ENCOUNTER — Telehealth (HOSPITAL_COMMUNITY): Payer: Self-pay | Admitting: *Deleted

## 2024-01-14 ENCOUNTER — Other Ambulatory Visit: Payer: Self-pay | Admitting: Nurse Practitioner

## 2024-01-14 DIAGNOSIS — K219 Gastro-esophageal reflux disease without esophagitis: Secondary | ICD-10-CM

## 2024-01-14 NOTE — Telephone Encounter (Signed)
 PATIENT HAS CALLED SAYING SHE KEEPS PLAYING PHONE TAG WITH YOU  & ASK FOR YOU TO CALL HER  A.S.A.P.

## 2024-01-18 ENCOUNTER — Telehealth: Payer: Self-pay | Admitting: Gastroenterology

## 2024-01-18 NOTE — Telephone Encounter (Signed)
 The patient called in request to schedule appointment.

## 2024-01-21 ENCOUNTER — Telehealth (HOSPITAL_COMMUNITY): Payer: Self-pay | Admitting: *Deleted

## 2024-01-21 NOTE — Telephone Encounter (Signed)
 Patient called stating that her injection of Vivitrol did not help and she is having withdrawal from alcohol. Discussed wanting detox but would like for her provider to call her back to give her advice on what her next steps would be. If a phone call can not be returned asking that a message be sent to her Mychart or email with instructions on where to go.

## 2024-01-22 ENCOUNTER — Other Ambulatory Visit: Payer: Self-pay

## 2024-01-22 ENCOUNTER — Other Ambulatory Visit (HOSPITAL_COMMUNITY)
Admission: EM | Admit: 2024-01-22 | Discharge: 2024-01-22 | Disposition: A | Discharge status: Other Acute Inpatient Hospital | Attending: Psychiatry | Admitting: Psychiatry

## 2024-01-22 ENCOUNTER — Other Ambulatory Visit (INDEPENDENT_AMBULATORY_CARE_PROVIDER_SITE_OTHER)
Admission: EM | Admit: 2024-01-22 | Discharge: 2024-01-26 | Disposition: A | Discharge status: Home / Self Care | Attending: Psychiatry | Admitting: Family Medicine

## 2024-01-22 DIAGNOSIS — F330 Major depressive disorder, recurrent, mild: Secondary | ICD-10-CM | POA: Diagnosis present

## 2024-01-22 DIAGNOSIS — F10939 Alcohol use, unspecified with withdrawal, unspecified: Secondary | ICD-10-CM | POA: Diagnosis present

## 2024-01-22 DIAGNOSIS — Z9181 History of falling: Secondary | ICD-10-CM | POA: Insufficient documentation

## 2024-01-22 DIAGNOSIS — F329 Major depressive disorder, single episode, unspecified: Secondary | ICD-10-CM | POA: Insufficient documentation

## 2024-01-22 DIAGNOSIS — F10282 Alcohol dependence with alcohol-induced sleep disorder: Secondary | ICD-10-CM | POA: Insufficient documentation

## 2024-01-22 DIAGNOSIS — F1020 Alcohol dependence, uncomplicated: Secondary | ICD-10-CM | POA: Diagnosis present

## 2024-01-22 DIAGNOSIS — Z9049 Acquired absence of other specified parts of digestive tract: Secondary | ICD-10-CM

## 2024-01-22 DIAGNOSIS — F50019 Anorexia nervosa, restricting type, unspecified: Secondary | ICD-10-CM | POA: Diagnosis not present

## 2024-01-22 DIAGNOSIS — F515 Nightmare disorder: Secondary | ICD-10-CM | POA: Insufficient documentation

## 2024-01-22 DIAGNOSIS — F5105 Insomnia due to other mental disorder: Secondary | ICD-10-CM | POA: Insufficient documentation

## 2024-01-22 DIAGNOSIS — Y909 Presence of alcohol in blood, level not specified: Secondary | ICD-10-CM | POA: Insufficient documentation

## 2024-01-22 DIAGNOSIS — F5000 Anorexia nervosa, unspecified: Secondary | ICD-10-CM | POA: Diagnosis present

## 2024-01-22 DIAGNOSIS — R9431 Abnormal electrocardiogram [ECG] [EKG]: Secondary | ICD-10-CM | POA: Insufficient documentation

## 2024-01-22 DIAGNOSIS — F439 Reaction to severe stress, unspecified: Secondary | ICD-10-CM | POA: Diagnosis present

## 2024-01-22 DIAGNOSIS — F19139 Other psychoactive substance abuse with withdrawal, unspecified: Secondary | ICD-10-CM | POA: Diagnosis not present

## 2024-01-22 DIAGNOSIS — F332 Major depressive disorder, recurrent severe without psychotic features: Secondary | ICD-10-CM | POA: Diagnosis not present

## 2024-01-22 DIAGNOSIS — F322 Major depressive disorder, single episode, severe without psychotic features: Secondary | ICD-10-CM | POA: Diagnosis present

## 2024-01-22 DIAGNOSIS — Z87898 Personal history of other specified conditions: Secondary | ICD-10-CM

## 2024-01-22 DIAGNOSIS — R45851 Suicidal ideations: Secondary | ICD-10-CM | POA: Diagnosis present

## 2024-01-22 DIAGNOSIS — F411 Generalized anxiety disorder: Secondary | ICD-10-CM | POA: Insufficient documentation

## 2024-01-22 DIAGNOSIS — F19982 Other psychoactive substance use, unspecified with psychoactive substance-induced sleep disorder: Secondary | ICD-10-CM | POA: Diagnosis present

## 2024-01-22 DIAGNOSIS — E440 Moderate protein-calorie malnutrition: Secondary | ICD-10-CM | POA: Insufficient documentation

## 2024-01-22 DIAGNOSIS — F10239 Alcohol dependence with withdrawal, unspecified: Secondary | ICD-10-CM | POA: Insufficient documentation

## 2024-01-22 DIAGNOSIS — F1024 Alcohol dependence with alcohol-induced mood disorder: Secondary | ICD-10-CM | POA: Insufficient documentation

## 2024-01-22 DIAGNOSIS — F4389 Other reactions to severe stress: Secondary | ICD-10-CM | POA: Diagnosis not present

## 2024-01-22 DIAGNOSIS — Z79899 Other long term (current) drug therapy: Secondary | ICD-10-CM | POA: Diagnosis not present

## 2024-01-22 DIAGNOSIS — F339 Major depressive disorder, recurrent, unspecified: Secondary | ICD-10-CM

## 2024-01-22 DIAGNOSIS — Z681 Body mass index (BMI) 19 or less, adult: Secondary | ICD-10-CM | POA: Insufficient documentation

## 2024-01-22 DIAGNOSIS — Z8659 Personal history of other mental and behavioral disorders: Secondary | ICD-10-CM

## 2024-01-22 DIAGNOSIS — F19939 Other psychoactive substance use, unspecified with withdrawal, unspecified: Secondary | ICD-10-CM | POA: Diagnosis present

## 2024-01-22 DIAGNOSIS — F10930 Alcohol use, unspecified with withdrawal, uncomplicated: Secondary | ICD-10-CM | POA: Diagnosis not present

## 2024-01-22 DIAGNOSIS — G4700 Insomnia, unspecified: Secondary | ICD-10-CM

## 2024-01-22 DIAGNOSIS — F1090 Alcohol use, unspecified, uncomplicated: Secondary | ICD-10-CM

## 2024-01-22 DIAGNOSIS — E519 Thiamine deficiency, unspecified: Secondary | ICD-10-CM | POA: Diagnosis present

## 2024-01-22 LAB — URINALYSIS, ROUTINE W REFLEX MICROSCOPIC
Bilirubin Urine: NEGATIVE
Glucose, UA: NEGATIVE mg/dL
Hgb urine dipstick: NEGATIVE
Ketones, ur: NEGATIVE mg/dL
Nitrite: NEGATIVE
Protein, ur: NEGATIVE mg/dL
Specific Gravity, Urine: 1.032 — ABNORMAL HIGH (ref 1.005–1.030)
pH: 5 (ref 5.0–8.0)

## 2024-01-22 LAB — COMPREHENSIVE METABOLIC PANEL WITH GFR
ALT: 20 U/L (ref 0–44)
AST: 25 U/L (ref 15–41)
Albumin: 3.6 g/dL (ref 3.5–5.0)
Alkaline Phosphatase: 74 U/L (ref 38–126)
Anion gap: 14 (ref 5–15)
BUN: 10 mg/dL (ref 6–20)
CO2: 23 mmol/L (ref 22–32)
Calcium: 9.7 mg/dL (ref 8.9–10.3)
Chloride: 104 mmol/L (ref 98–111)
Creatinine, Ser: 0.8 mg/dL (ref 0.44–1.00)
GFR, Estimated: >60 mL/min (ref >60)
Glucose, Bld: 64 mg/dL — ABNORMAL LOW (ref 70–99)
Potassium: 4.5 mmol/L (ref 3.5–5.1)
Sodium: 141 mmol/L (ref 135–145)
Total Bilirubin: 0.4 mg/dL (ref 0.0–1.2)
Total Protein: 6.2 g/dL — ABNORMAL LOW (ref 6.5–8.1)

## 2024-01-22 LAB — CBC WITH DIFFERENTIAL/PLATELET
Abs Immature Granulocytes: 0.02 10*3/uL (ref 0.00–0.07)
Basophils Absolute: 0 10*3/uL (ref 0.0–0.1)
Basophils Relative: 1 %
Eosinophils Absolute: 0.3 10*3/uL (ref 0.0–0.5)
Eosinophils Relative: 4 %
HCT: 35.6 % — ABNORMAL LOW (ref 36.0–46.0)
Hemoglobin: 12 g/dL (ref 12.0–15.0)
Immature Granulocytes: 0 %
Lymphocytes Relative: 31 %
Lymphs Abs: 2.5 10*3/uL (ref 0.7–4.0)
MCH: 32.1 pg (ref 26.0–34.0)
MCHC: 33.7 g/dL (ref 30.0–36.0)
MCV: 95.2 fL (ref 80.0–100.0)
Monocytes Absolute: 0.3 10*3/uL (ref 0.1–1.0)
Monocytes Relative: 4 %
Neutro Abs: 4.8 10*3/uL (ref 1.7–7.7)
Neutrophils Relative %: 60 %
Platelets: 326 10*3/uL (ref 150–400)
RBC: 3.74 MIL/uL — ABNORMAL LOW (ref 3.87–5.11)
RDW: 13.7 % (ref 11.5–15.5)
WBC: 7.9 10*3/uL (ref 4.0–10.5)
nRBC: 0 % (ref 0.0–0.2)

## 2024-01-22 LAB — POCT URINE DRUG SCREEN - MANUAL ENTRY (I-SCREEN)
POC Amphetamine UR: POSITIVE — AB
POC Buprenorphine (BUP): NOT DETECTED
POC Cocaine UR: NOT DETECTED
POC Marijuana UR: POSITIVE — AB
POC Methadone UR: NOT DETECTED
POC Methamphetamine UR: POSITIVE — AB
POC Morphine: NOT DETECTED
POC Oxazepam (BZO): POSITIVE — AB
POC Oxycodone UR: POSITIVE — AB
POC Secobarbital (BAR): NOT DETECTED

## 2024-01-22 LAB — LIPID PANEL
Cholesterol: 225 mg/dL — ABNORMAL HIGH (ref 0–200)
HDL: 79 mg/dL (ref >40)
LDL Cholesterol: 89 mg/dL (ref 0–99)
Total CHOL/HDL Ratio: 2.8 ratio
Triglycerides: 285 mg/dL — ABNORMAL HIGH (ref <150)
VLDL: 57 mg/dL — ABNORMAL HIGH (ref 0–40)

## 2024-01-22 LAB — ETHANOL: Alcohol, Ethyl (B): 76 mg/dL — ABNORMAL HIGH (ref <10)

## 2024-01-22 LAB — TSH: TSH: 0.764 u[IU]/mL (ref 0.350–4.500)

## 2024-01-22 LAB — MAGNESIUM: Magnesium: 1.5 mg/dL — ABNORMAL LOW (ref 1.7–2.4)

## 2024-01-22 LAB — HEMOGLOBIN A1C
Hgb A1c MFr Bld: 5 % (ref 4.8–5.6)
Mean Plasma Glucose: 96.8 mg/dL

## 2024-01-22 MED ORDER — THIAMINE MONONITRATE 100 MG PO TABS: 100.0000 mg | ORAL_TABLET | Freq: Every day | ORAL | Status: DC

## 2024-01-22 MED ORDER — ADULT MULTIVITAMIN W/MINERALS CH
1.0000 | ORAL_TABLET | Freq: Every day | ORAL | Status: DC
  Administered 2024-01-22 – 2024-01-24 (×3): 1 via ORAL
  Filled 2024-01-22 (×3): qty 1

## 2024-01-22 MED ORDER — QUETIAPINE FUMARATE 50 MG PO TABS
50.0000 mg | ORAL_TABLET | Freq: Every day | ORAL | Status: DC
  Administered 2024-01-23 – 2024-01-24 (×2): 50 mg via ORAL
  Filled 2024-01-22 (×2): qty 1

## 2024-01-22 MED ORDER — QUETIAPINE FUMARATE 400 MG PO TABS: 400.0000 mg | ORAL_TABLET | Freq: Every day | ORAL | Status: DC

## 2024-01-22 MED ORDER — ALUM & MAG HYDROXIDE-SIMETH 200-200-20 MG/5ML PO SUSP: 30.0000 mL | ORAL | Status: DC | PRN

## 2024-01-22 MED ORDER — LORAZEPAM 1 MG PO TABS: 1.0000 mg | ORAL_TABLET | Freq: Two times a day (BID) | ORAL | Status: DC

## 2024-01-22 MED ORDER — LORAZEPAM 1 MG PO TABS: 1.0000 mg | ORAL_TABLET | Freq: Four times a day (QID) | ORAL | Status: DC

## 2024-01-22 MED ORDER — LORAZEPAM 1 MG PO TABS: 1.0000 mg | ORAL_TABLET | Freq: Four times a day (QID) | ORAL | Status: DC | PRN

## 2024-01-22 MED ORDER — QUETIAPINE FUMARATE 50 MG PO TABS: 50.0000 mg | ORAL_TABLET | Freq: Every day | ORAL | Status: DC

## 2024-01-22 MED ORDER — LORAZEPAM 1 MG PO TABS
1.0000 mg | ORAL_TABLET | Freq: Four times a day (QID) | ORAL | Status: AC
  Administered 2024-01-22 – 2024-01-23 (×4): 1 mg via ORAL
  Filled 2024-01-22 (×4): qty 1

## 2024-01-22 MED ORDER — OLANZAPINE 5 MG PO TBDP: 5.0000 mg | ORAL_TABLET | Freq: Three times a day (TID) | ORAL | Status: DC | PRN

## 2024-01-22 MED ORDER — HYDROXYZINE HCL 25 MG PO TABS: 50.0000 mg | ORAL_TABLET | Freq: Three times a day (TID) | ORAL | Status: DC | PRN

## 2024-01-22 MED ORDER — LORAZEPAM 1 MG PO TABS: 1.0000 mg | ORAL_TABLET | Freq: Every day | ORAL | Status: DC

## 2024-01-22 MED ORDER — LORAZEPAM 1 MG PO TABS
1.0000 mg | ORAL_TABLET | Freq: Three times a day (TID) | ORAL | Status: DC
  Administered 2024-01-23 – 2024-01-24 (×2): 1 mg via ORAL
  Filled 2024-01-22 (×2): qty 1

## 2024-01-22 MED ORDER — TRAZODONE HCL 100 MG PO TABS: 200.0000 mg | ORAL_TABLET | Freq: Every day | ORAL | Status: DC

## 2024-01-22 MED ORDER — MAGNESIUM HYDROXIDE 400 MG/5ML PO SUSP: 30.0000 mL | Freq: Every day | ORAL | Status: DC | PRN

## 2024-01-22 MED ORDER — ACETAMINOPHEN 325 MG PO TABS: 650.0000 mg | ORAL_TABLET | Freq: Four times a day (QID) | ORAL | Status: DC | PRN

## 2024-01-22 MED ORDER — THIAMINE HCL 100 MG/ML IJ SOLN
100.0000 mg | Freq: Once | INTRAMUSCULAR | Status: AC
  Administered 2024-01-22: 100 mg via INTRAMUSCULAR
  Filled 2024-01-22: qty 2

## 2024-01-22 MED ORDER — GABAPENTIN 300 MG PO CAPS
300.0000 mg | ORAL_CAPSULE | Freq: Three times a day (TID) | ORAL | Status: DC
  Administered 2024-01-22 – 2024-01-26 (×12): 300 mg via ORAL
  Filled 2024-01-22 (×12): qty 1

## 2024-01-22 MED ORDER — HYDROXYZINE HCL 25 MG PO TABS
50.0000 mg | ORAL_TABLET | Freq: Three times a day (TID) | ORAL | Status: DC | PRN
  Administered 2024-01-25 (×2): 50 mg via ORAL
  Filled 2024-01-22 (×2): qty 2

## 2024-01-22 MED ORDER — TRAZODONE HCL 100 MG PO TABS
200.0000 mg | ORAL_TABLET | Freq: Every day | ORAL | Status: DC
  Administered 2024-01-22 – 2024-01-25 (×4): 200 mg via ORAL
  Filled 2024-01-22 (×4): qty 2

## 2024-01-22 MED ORDER — LORAZEPAM 1 MG PO TABS
1.0000 mg | ORAL_TABLET | Freq: Four times a day (QID) | ORAL | Status: AC | PRN
  Filled 2024-01-22: qty 1

## 2024-01-22 MED ORDER — LOPERAMIDE HCL 2 MG PO CAPS: 2.0000 mg | ORAL_CAPSULE | ORAL | Status: DC | PRN

## 2024-01-22 MED ORDER — QUETIAPINE FUMARATE 200 MG PO TABS
400.0000 mg | ORAL_TABLET | Freq: Every day | ORAL | Status: DC
  Administered 2024-01-22 – 2024-01-25 (×4): 400 mg via ORAL
  Filled 2024-01-22 (×4): qty 2

## 2024-01-22 MED ORDER — OLANZAPINE 10 MG IM SOLR: 5.0000 mg | Freq: Three times a day (TID) | INTRAMUSCULAR | Status: DC | PRN

## 2024-01-22 MED ORDER — ADULT MULTIVITAMIN W/MINERALS CH: 1.0000 | ORAL_TABLET | Freq: Every day | ORAL | Status: DC

## 2024-01-22 MED ORDER — THIAMINE MONONITRATE 100 MG PO TABS
100.0000 mg | ORAL_TABLET | Freq: Every day | ORAL | Status: DC
  Administered 2024-01-23 – 2024-01-24 (×2): 100 mg via ORAL
  Filled 2024-01-22 (×2): qty 1

## 2024-01-22 MED ORDER — LOPERAMIDE HCL 2 MG PO CAPS: 2.0000 mg | ORAL_CAPSULE | ORAL | Status: AC | PRN

## 2024-01-22 MED ORDER — GABAPENTIN 300 MG PO CAPS: 300.0000 mg | ORAL_CAPSULE | Freq: Three times a day (TID) | ORAL | Status: DC

## 2024-01-22 MED ORDER — LORAZEPAM 1 MG PO TABS: 1.0000 mg | ORAL_TABLET | Freq: Three times a day (TID) | ORAL | Status: DC

## 2024-01-22 MED ORDER — THIAMINE HCL 100 MG/ML IJ SOLN: 100.0000 mg | Freq: Once | INTRAMUSCULAR | Status: DC

## 2024-01-22 NOTE — ED Notes (Signed)
Patient discharged to [facility]

## 2024-01-22 NOTE — ED Notes (Signed)
 Pt is in the dayroom watching TV with peers. Pt denies SI/HI/AVH. Pt has no further complain.No acute distress noted. Will continue to monitor for safety and provide support.

## 2024-01-22 NOTE — ED Provider Notes (Signed)
 Behavioral Health Urgent Care Medical Screening Exam  Patient Name: Carla Martinez MRN: [id number] Date of Evaluation: 01/22/24 Chief Complaint:  "I can't stop drinking"  Diagnosis:  Final diagnoses:  Alcohol withdrawal syndrome without complication (HCC)    History of Present illness: Carla Martinez is a 63 y.o. female who presents to [facility] voluntarily accompanied by her mother. Pt states that she needs to detox from alcohol and NyQuil. Pt states that the other nighr she had a bad dream that something was there but really wasn't. Pt states that she has passive SI thoughts without a plan because she is to much of a coward. Pt states that she does wishes she would go to sleep and not wake up. Pt states that she Nyquil an hour ago & a mini bottle of wine earlier this morning. Pt states that she really needs some help and that she sees Eddie upstairs in the outpatient clinic.   Carla Martinez is seen face-to-face in [facility]. Chart reviewed with Dr Loleta Chance. Information is provided by the patient and she appears to be a reliable historian.   Today, "Carla Martinez" states "I can't stop drinking." She reports past mental health diagnoses of MDD and alcohol use disorder. Carla Martinez states she began drinking at the advisement of her physician to drink "1-2 glasses of wine at bedtime" after the birth of her son 26 years ago. States alcohol use increased and worsened two years ago after the death of her daughter who passed from complications due to alcohol use.She reports multiple inpatient admissions for alcohol detox; she is unable to recall her last admission. Received Vivitrol injection on 01/07/2024 and states that "it didn't help." States cravings for alcohol became worse and she was experiencing withdrawal symptoms to include nausea and tremors. She was recommended by her outpatient provider, E Nwoko, PA to come to [facility] for evaluation and admission for alcohol detox.   States persistent alcohol cravings. States her last  alcohol intake was this morning around 11am when she drank 2 bottles of [employer] [place] wine (187 ml each) and 4 cups (30 ml ea) of Nyquil. States she drank at this time because "I was shaking and in withdrawals for sure." She endorses a poor appetite most days of the week. States she ate a half piece of dry toast yesterday. No solid intake today, only "a few sips" of 30g protein shake. Drinks alcohol daily in varying amounts. Desires treatment for alcohol use stating "I have to take care of my son." She is divorced and lives with her 26 yo mother and 26 yo son who has Downs Syndrome.   She denies suicidal or homicidal ideation, intent or plan. She denies AVH. She agrees to voluntary admission to [facility] for alcohol detox.    Flowsheet Row ED from 01/22/2024 in [facility] Video Visit from 12/31/2023 in [facility] ED from 12/17/2023 in [facility] Emergency Department at [facility]  C-SSRS RISK CATEGORY Low Risk Low Risk No Risk       Psychiatric Specialty Exam  Presentation  General Appearance:Appropriate for Environment  Eye Contact:Good  Speech:Clear and Coherent; Normal Rate  Speech Volume:Normal  Handedness:Right   Mood and Affect  Mood:Depressed  Affect:Depressed   Thought Process  Thought Processes:Coherent  Descriptions of Associations:Intact  Orientation:Full (Time, Place and Person)  Thought Content:Abstract Reasoning  Diagnosis of Schizophrenia or Schizoaffective disorder in past: No data recorded Duration of Psychotic Symptoms: No data recorded Hallucinations:None  Ideas of Reference:None  Suicidal  Thoughts:No  Homicidal Thoughts:No   Sensorium  Memory:Immediate Good; Remote Fair  Judgment:Good  Insight:Good   Executive Functions  Concentration:Fair  Attention Span:Fair  Recall:Fair  Fund of Knowledge:Good  Language:Good   Psychomotor Activity  Psychomotor Activity:Normal   Assets   Assets:Communication Skills; Desire for Improvement; Housing; Resilience   Sleep  Sleep:Poor  Number of hours: 4   Physical Exam: Physical Exam Vitals reviewed.  Constitutional:      Comments: underweight  HENT:     Head: Normocephalic.  Cardiovascular:     Rate and Rhythm: Normal rate.  Pulmonary:     Effort: Pulmonary effort is normal.  Skin:    General: Skin is warm and dry.  Neurological:     Mental Status: She is alert.  Psychiatric:        Behavior: Behavior normal.    Review of Systems  Constitutional:  Negative for chills and fever.  Eyes:        Wears glasses  Respiratory:  Negative for cough.   Cardiovascular:  Negative for chest pain.  Gastrointestinal:  Negative for nausea and vomiting.  Skin:  Negative for itching.  Neurological:  Positive for tremors.   Blood pressure 94/65, pulse 95, temperature 97.7 F (36.5 C), temperature source Oral, resp. rate 16, SpO2 96%. There is no height or weight on file to calculate BMI.  Musculoskeletal: Strength & Muscle Tone: within normal limits Gait & Station: normal Patient leans: N/A   [facility] MSE Discharge Disposition for Follow up and Recommendations: Based on my evaluation I certify that psychiatric inpatient services furnished can reasonably be expected to improve the patient's condition which I recommend transfer to an appropriate accepting facility.   Admit to [facility].  Alberteen Sam, PMHNP-BC, FNP-BC  01/22/2024, 3:23 PM

## 2024-01-22 NOTE — Telephone Encounter (Signed)
 Message acknowledged and reviewed.

## 2024-01-22 NOTE — Discharge Instructions (Signed)
 Patient accepted to [facility].
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
## 2024-01-22 NOTE — [facility] Group Notes (Signed)
 SPIRITUALITY GROUP NOTE  Spirituality group facilitated by Wilkie Aye, MDiv, BCC.  Group Description: Group focused on topic of hope. Patients participated in facilitated discussion around topic, connecting with one another around experiences and definitions for hope. Group members engaged with visual explorer photos, reflecting on what hope looks like for them today. Group engaged in discussion around how their definitions of hope are present today in hospital.  Modalities: Psycho-social ed, Adlerian, Narrative, MI  Patient Progress: DID NOT ATTEND

## 2024-01-22 NOTE — ED Notes (Signed)
 Voluntary status.  Admitted to [facility], room 168 at 1530 for ETOH detox.  Pt reports she can't stop drinking without help.  Medical HX of CHF, paroxysmal A fib, Anorexia, ETOH withdrawal seizures and Takotsuba cardiomyopathy. Pt report chronic passive SI without plan.    Pt reported last drink right before coming in for assessment.  Reports drinking approximately 1 bottle of nyquil in 24 hours and 4 pack of  mini bottles of wine through out the day, Pt reports tremors, anxiety, pins and needles sensations and weakness.  Increase in tremors when staff is assessing or vital signs are being taken. Hx of feeling confused with W/d symptoms.    [facility] treatment expectations explained. Pt verbalized understanding Pt is on Ativan taper.  When discussing ativan protocol and doses, pt stated dosing may not be enough because "I drink a lot"   Assured patient her w/d symptoms will be monitored closely by nursing staff. Q 15 minute observation for safety in place

## 2024-01-22 NOTE — Telephone Encounter (Signed)
 Message acknowledged and reviewed. Provider was able to reach out to patient regarding her concerns over her withdrawal from alcohol.  Provider discussed with patient detox facility options.  Patient was instructed to follow up with [facility] for an assessment and for detox.
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
## 2024-01-23 ENCOUNTER — Encounter (HOSPITAL_COMMUNITY): Payer: Self-pay | Admitting: Family Medicine

## 2024-01-23 DIAGNOSIS — E519 Thiamine deficiency, unspecified: Secondary | ICD-10-CM | POA: Diagnosis present

## 2024-01-23 DIAGNOSIS — Z87898 Personal history of other specified conditions: Secondary | ICD-10-CM

## 2024-01-23 DIAGNOSIS — Z8659 Personal history of other mental and behavioral disorders: Secondary | ICD-10-CM

## 2024-01-23 DIAGNOSIS — F19939 Other psychoactive substance use, unspecified with withdrawal, unspecified: Secondary | ICD-10-CM | POA: Diagnosis present

## 2024-01-23 HISTORY — DX: Personal history of other specified conditions: Z86.59

## 2024-01-23 HISTORY — DX: Thiamine deficiency, unspecified: E51.9

## 2024-01-23 MED ORDER — BOOST / RESOURCE BREEZE PO LIQD CUSTOM
1.0000 | Freq: Three times a day (TID) | ORAL | Status: DC
  Administered 2024-01-23 – 2024-01-26 (×7): 1 via ORAL

## 2024-01-23 MED ORDER — ACAMPROSATE CALCIUM 333 MG PO TBEC
666.0000 mg | DELAYED_RELEASE_TABLET | Freq: Three times a day (TID) | ORAL | Status: DC
  Administered 2024-01-23 – 2024-01-26 (×8): 666 mg via ORAL
  Filled 2024-01-23 (×8): qty 2

## 2024-01-23 MED ORDER — VITAMIN B-12 100 MCG PO TABS
100.0000 ug | ORAL_TABLET | Freq: Every day | ORAL | Status: DC
  Administered 2024-01-23 – 2024-01-24 (×2): 100 ug via ORAL
  Filled 2024-01-23 (×2): qty 1

## 2024-01-23 NOTE — ED Notes (Signed)
 Patient sitting in dayroom interacting with peers. No acute distress noted. No concerns voiced. Informed patient to notify staff with any needs or assistance. Patient verbalized understanding or agreement. Safety checks in place per facility policy.

## 2024-01-23 NOTE — ED Notes (Signed)
 Patient is sleeping. Respirations equal and unlabored, skin warm and dry. No change in assessment or acuity. Routine safety checks conducted according to facility protocol. Will continue to monitor for safety.

## 2024-01-23 NOTE — ED Notes (Signed)
 Patient resting with eyes closed in no apparent acute distress. Respirations even and unlabored. Environment secured. Safety checks in place according to facility policy.

## 2024-01-23 NOTE — ED Notes (Signed)
 Writer attempted bloodwork x1. Was unsuccessful. Got another staff member to attempt.

## 2024-01-23 NOTE — ED Notes (Signed)
 Patient in the dayroom with other patients watching TV. Calm and pleasant. Respirations even and unlabored. Evening snacks given. Environmental checked, secured and safe per per policy. Will monitor for safety.

## 2024-01-23 NOTE — ED Notes (Signed)
 Patient alert & oriented x4 but appears confused. Answers questions appropriately. Denies intent to harm self or others when asked. Denies A/VH. Patient reports pain in R ribs and R hip from previous fractures rating 7/10. No acute distress noted. Support and encouragement provided. Routine safety checks conducted per facility protocol. Encouraged patient to notify staff if any thoughts of harm towards self or others arise. Patient verbalizes understanding and agreement.

## 2024-01-23 NOTE — ED Notes (Signed)
 Pt calm and sleeping in the bedroom. NAD. Respirations even and unlabored. Will monitor for safety.

## 2024-01-23 NOTE — ED Provider Notes (Signed)
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 monthly (01/07/2024), trazodone 200 mg qPM, prozac 40 mg qAM  STARTED campral 666 mg TID (new med) STARTED Boost supplements STARTED B12 and B1 supplements FU B1 level, tylenol level, ammonia, cmp, mag STARTED fall risk precautions Continued ativan taper  Continued MV  Monitoring: CIWA  Orders Placed This Encounter  Procedures   Acetaminophen level   Vitamin B1   Comprehensive metabolic panel with GFR   Ammonia   Magnesium  Diet Heart Room service appropriate? No; Fluid consistency: Thin   Safety Checks Every 15 Minutes   Voluntary Treatment   Vital signs and O2 sats   Activity as tolerated   Clinical institute withdrawal assessment while awake x 48 hours, then BID x 48 hours, then Daily x  48 hours then discontinue   Care order/instruction: Denture adhesive cream, Fix-o-Dent once daily for dentures   Full code   Admit to Facility Based Crisis   Fall precautions    Facility Ordered Medications  Medication   acetaminophen (TYLENOL) tablet 650 mg   alum & mag hydroxide-simeth (MAALOX/MYLANTA) 200-200-20 MG/5ML suspension 30 mL   magnesium hydroxide (MILK OF MAGNESIA) suspension 30 mL   OLANZapine zydis (ZYPREXA) disintegrating tablet 5 mg   OLANZapine (ZYPREXA) injection 5 mg   gabapentin (NEURONTIN) capsule 300 mg   hydrOXYzine (ATARAX) tablet 50 mg   loperamide (IMODIUM) capsule 2-4 mg   LORazepam (ATIVAN) tablet 1 mg   [COMPLETED] LORazepam (ATIVAN) tablet 1 mg   Followed by   LORazepam (ATIVAN) tablet 1 mg   Followed by   Melene Muller ON 01/24/2024] LORazepam (ATIVAN) tablet 1 mg   Followed by   Melene Muller ON 01/26/2024] LORazepam (ATIVAN) tablet 1 mg   multivitamin with minerals tablet 1 tablet   QUEtiapine (SEROQUEL) tablet 400 mg   QUEtiapine (SEROQUEL) tablet 50 mg   [COMPLETED] thiamine (VITAMIN B1) injection 100 mg   thiamine (VITAMIN B1) tablet 100 mg   traZODone (DESYREL) tablet 200 mg   feeding supplement (BOOST / RESOURCE BREEZE) liquid 1 Container   acamprosate (CAMPRAL) tablet 666 mg   vitamin B-12 (CYANOCOBALAMIN) tablet 100 mcg   PTA Medications  Medication Sig   gabapentin (NEURONTIN) 300 MG capsule Take 1 capsule (300 mg total) by mouth 3 (three) times daily.   [START ON 01/30/2024] FLUoxetine (PROZAC) 40 MG capsule Take 1 capsule (40 mg total) by mouth daily.   QUEtiapine (SEROQUEL) 400 MG tablet Take 1 tablet (400 mg total) by mouth at bedtime.   hydrOXYzine (ATARAX) 50 MG tablet Take 1 tablet (50 mg total) by mouth 3 (three) times daily as needed. (Patient taking differently: Take 50 mg by mouth in the morning, at noon, and at bedtime.)   traZODone (DESYREL) 100 MG tablet Take 2 tablets (200 mg total) by mouth at bedtime.   QUEtiapine (SEROQUEL) 50 MG  tablet Take 1 tablet (50 mg total) by mouth daily.   meloxicam (MOBIC) 15 MG tablet Take 1 tablet (15 mg total) by mouth daily. Take with food (Patient taking differently: Take 15 mg by mouth daily as needed for pain.)   promethazine (PHENERGAN) 12.5 MG tablet Take 1 tablet (12.5 mg total) by mouth every 8 (eight) hours as needed for nausea or vomiting.   pantoprazole (PROTONIX) 40 MG tablet Take 1 tablet by mouth once daily (Patient taking differently: Take 40 mg by mouth at bedtime.)    Princess Bruins, DO Psych Resident, PGY-3 01/23/24  6:50 PM .
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
## 2024-01-24 ENCOUNTER — Encounter (HOSPITAL_COMMUNITY): Payer: Self-pay | Admitting: Family Medicine

## 2024-01-24 DIAGNOSIS — F1024 Alcohol dependence with alcohol-induced mood disorder: Secondary | ICD-10-CM | POA: Diagnosis not present

## 2024-01-24 DIAGNOSIS — F439 Reaction to severe stress, unspecified: Secondary | ICD-10-CM | POA: Diagnosis present

## 2024-01-24 HISTORY — DX: Reaction to severe stress, unspecified: F43.9

## 2024-01-24 LAB — COMPREHENSIVE METABOLIC PANEL WITH GFR
ALT: 15 U/L (ref 0–44)
AST: 19 U/L (ref 15–41)
Albumin: 3.1 g/dL — ABNORMAL LOW (ref 3.5–5.0)
Alkaline Phosphatase: 70 U/L (ref 38–126)
Anion gap: 11 (ref 5–15)
BUN: 8 mg/dL (ref 6–20)
CO2: 26 mmol/L (ref 22–32)
Calcium: 9.6 mg/dL (ref 8.9–10.3)
Chloride: 104 mmol/L (ref 98–111)
Creatinine, Ser: 0.74 mg/dL (ref 0.44–1.00)
GFR, Estimated: >60 mL/min (ref >60)
Glucose, Bld: 81 mg/dL (ref 70–99)
Potassium: 4 mmol/L (ref 3.5–5.1)
Sodium: 141 mmol/L (ref 135–145)
Total Bilirubin: 0.5 mg/dL (ref 0.0–1.2)
Total Protein: 5.6 g/dL — ABNORMAL LOW (ref 6.5–8.1)

## 2024-01-24 LAB — MAGNESIUM: Magnesium: 1.3 mg/dL — ABNORMAL LOW (ref 1.7–2.4)

## 2024-01-24 LAB — ACETAMINOPHEN LEVEL: Acetaminophen (Tylenol), Serum: <10 ug/mL — ABNORMAL LOW (ref 10–30)

## 2024-01-24 MED ORDER — PRAZOSIN HCL 1 MG PO CAPS
1.0000 mg | ORAL_CAPSULE | Freq: Every day | ORAL | Status: DC
  Administered 2024-01-24: 1 mg via ORAL
  Filled 2024-01-24: qty 1

## 2024-01-24 MED ORDER — VITAMIN B-12 100 MCG PO TABS
100.0000 ug | ORAL_TABLET | ORAL | Status: DC
  Administered 2024-01-25 – 2024-01-26 (×2): 100 ug via ORAL
  Filled 2024-01-24 (×2): qty 1

## 2024-01-24 MED ORDER — CHLORDIAZEPOXIDE HCL 25 MG PO CAPS
25.0000 mg | ORAL_CAPSULE | Freq: Three times a day (TID) | ORAL | Status: AC
  Administered 2024-01-24 – 2024-01-25 (×3): 25 mg via ORAL
  Filled 2024-01-24 (×3): qty 1

## 2024-01-24 MED ORDER — MAGNESIUM OXIDE -MG SUPPLEMENT 400 (240 MG) MG PO TABS
400.0000 mg | ORAL_TABLET | Freq: Two times a day (BID) | ORAL | Status: DC
  Administered 2024-01-24 – 2024-01-26 (×5): 400 mg via ORAL
  Filled 2024-01-24 (×5): qty 1

## 2024-01-24 MED ORDER — ADULT MULTIVITAMIN W/MINERALS CH
1.0000 | ORAL_TABLET | ORAL | Status: DC
Start: 2024-01-25 — End: 2024-01-26
  Administered 2024-01-25 – 2024-01-26 (×2): 1 via ORAL
  Filled 2024-01-24 (×2): qty 1

## 2024-01-24 MED ORDER — QUETIAPINE FUMARATE 50 MG PO TABS
50.0000 mg | ORAL_TABLET | ORAL | Status: DC
Start: 2024-01-25 — End: 2024-01-26
  Administered 2024-01-25 – 2024-01-26 (×2): 50 mg via ORAL
  Filled 2024-01-24 (×2): qty 1

## 2024-01-24 MED ORDER — THIAMINE MONONITRATE 100 MG PO TABS
100.0000 mg | ORAL_TABLET | ORAL | Status: DC
  Administered 2024-01-25 – 2024-01-26 (×2): 100 mg via ORAL
  Filled 2024-01-24 (×2): qty 1

## 2024-01-24 NOTE — ED Notes (Signed)
 Patient sitting in dayroom interacting with peers. No acute distress noted. No concerns voiced. Informed patient to notify staff with any needs or assistance. Patient verbalized understanding or agreement. Safety checks in place per facility policy.

## 2024-01-24 NOTE — ED Notes (Signed)
 Patient resting with eyes closed in no apparent acute distress. Respirations even and unlabored. Environment secured. Safety checks in place according to facility policy.
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
## 2024-01-24 NOTE — ED Notes (Signed)
 Blind weight obtained per provider orders. Chart updated. Provider Roselle Locus, MD made aware.

## 2024-01-24 NOTE — ED Notes (Signed)
 Patient is in the bedroom calm and sleeping. NAD. Respirations are even and unlabored. Will continue to monitor for safety.

## 2024-01-24 NOTE — ED Notes (Signed)
 Patient sitting in courtyard interacting with peers and participating in group. No acute distress noted. No concerns voiced. Informed patient to notify staff with any needs or assistance. Patient verbalized understanding or agreement. Safety checks in place per facility policy.

## 2024-01-24 NOTE — ED Notes (Signed)
 Patient asking to read nutrition labels on foods prior to meals. Provider Princess Bruins, DO made aware. Per provider staff to remove nutrition labels on patients items from here on out. Staff also to monitor meal times to ensure other patients in the milieu are not giving nutrition labels to patient.

## 2024-01-24 NOTE — ED Notes (Signed)
 Patient alert & oriented x4. Denies intent to harm self or others when asked. Denies A/VH. Patient denies any physical complaints when asked. No acute distress noted. Patient has history of anorexia nervosa per provider note. Patient was requesting to read nutrition labels to foods prior to meal time this am. Provider instruction to remove all nutrition labels if possible and if not possible then to cover labels or provide items in sperate container. Patient has continued asking staff about nutrition labels throughout day. Scheduled medications administered with no complications. Support and encouragement provided. Routine safety checks conducted per facility protocol. Encouraged patient to notify staff if any thoughts of harm towards self or others arise. Patient verbalizes understanding and agreement.

## 2024-01-24 NOTE — Discharge Instructions (Addendum)
 Recommend that you talk to your PCP about a nutrition referral  [facility] Counsel[facility]iat[facility] [address] [place], [place] [address] map [phone] We offer individual counseling, psychiatry/medication management (virtual only), family counseling, couples therapy/marriage counseling, and child & teen therapy. Popular approaches include CBT, DBT, EMDR, Christian Counseling, PCT, Motivational Interviewing, EFT, Positive Psychology, Psychodynamic therapy, Relational therapy, Narrative therapy, and more.  [facility] [address] [place], [place] [address] map [phone] We specialize in providing personalized mental health therapy that meets you where you are. With a team of experienced therapists, we offer services like Child First and family-focused therapy to help address issues such as behavior challenges, emotional regulation, and relationship building.  [address] Counselor, [facility] [place], [place] [address] [phone] Masters degree in [place] [employer] and utilize IFS and EMDR primarily. My goal is to create a safe space for you to feel what you feel - no judgment, for you to explore your story - all the parts, and for you to experience healing. Give me a call or shoot me an email today. You matter more than you know, and I'd be honored to be a part of your story.  [facility] Clinical Social Work/Therapist, LCSW, LCAS [address] [place], [place] [address] map [phone] I'm a Licensed Visual merchandiser and Addiction Specialist with a master's degree from the joint program at [employer] and [employer]. My clinical background spans individual counseling, crisis centers, mobile crisis teams, and community mental health. I use cognitive-behavioral, solution-focused, dialectical-behavioral, and internal family systems therapies to tailor my approach to each client's needs, addressing trauma, depression,  anxiety, gender identity/sexual orientation struggles, mood disorders, relationship issues, and addiction.  Thalia Bloodgood Licensed Professional Counselor, EdS, MS, [facility], LCAS, CSI [facility] [address] [place], [place] [address] [phone] My passion lies in serving individuals, couples, and families who struggle with substance abuse, mental health, and life issues that others may misunderstand. My vision and purpose are to increase awareness of addiction and mental health while also educating about healthy relationships, conflict resolution, and developing strategies and coping skills to improve the overall quality of life.

## 2024-01-24 NOTE — ED Notes (Signed)
 Patient observed/assessed in room in bed appearing in no immediate distress resting peacefully. Q15 minute checks continued by MHT and nursing staff. Will continue to monitor and support.

## 2024-01-24 NOTE — ED Notes (Signed)
 Pt is in the dayroom watching TV with peers. Requested for 2 bowls of cereal. Pt denies SI/HI/AVH. Pt has no further complain.No acute distress noted. Will continue to monitor for safety and provide support.

## 2024-01-24 NOTE — ED Provider Notes (Signed)
 Behavioral Health Progress Note  Date and Time: 01/24/2024 3:36 PM Name: Carla Martinez MRN:  [id number] CC: etoh and nyquil detox  Subjective:  Carla Martinez is a 63 y.o. female, with a documented PMH of MDD, GAD, anorexia nervosa, AUD (h/o sz), nyquil misuse, who presented to [facility] (01/22/2024) as a voluntary walk-in with mom, then admitted to [facility] the same day for substance use treatment and detox   Of note, RN mentioned that patient was asking to calorie count the food   Still having some withdrawal sxs, tremulous, but getting better. No cravings at this time. Denied side effects to campral.  Big trigger for her substance use is missing her daughter, who died ~2ya from etoh related complications. She doesn't think she has tried prazosin for nightmares. Discussed the risk, benefits, side effects of it, especially low BP given her BP is lower at baseline. We will keep and eye on it and will stop if it lowers her BP. She wants to go home ~4/8 because she wants to go to her colonoscopy on 4/10, and needs to start the prep.   Mood:Anxious, Depressed (she's missing her deceased daughter) Sleep:Poor (difficulties with staying asleep despite all of the sedating rx she is taking now. She has recurrent nightmares about her deceased daughter multiple times a week. Had one last night) Appetite: low, she is eating some, but not much SI:No HI:No WUJ:WJXB Ideas of Reference:None   Review of Systems  Constitutional:  Positive for malaise/fatigue. Negative for chills.  HENT:  Negative for congestion.   Respiratory:  Negative for shortness of breath.   Cardiovascular:  Negative for chest pain and palpitations.  Gastrointestinal:  Positive for constipation. Negative for abdominal pain, diarrhea, nausea and vomiting.  Musculoskeletal:  Positive for back pain and neck pain. Negative for falls.  Neurological:  Positive for tremors and weakness. Negative for dizziness and headaches.      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)  History of bowel resection  Moderate protein-calorie malnutrition (HCC)    Past Psychiatric History: See H&P Past Medical History: See H&P Family History: See H&P Family Psychiatric  History: See H&P Social History: See H&P  Total Time spent with patient: 30 minutes  Additional Social History:                         Current Medications:  Current Facility-Administered Medications  Medication Dose Route Frequency Provider Last Rate Last Admin   acamprosate (CAMPRAL) tablet 666 mg  666 mg Oral TID with meals Princess Bruins, DO   666 mg at 01/24/24 1226   acetaminophen (TYLENOL) tablet 650 mg  650 mg Oral Q6H PRN Kristeen Mans, NP       alum & mag hydroxide-simeth (MAALOX/MYLANTA) 200-200-20 MG/5ML suspension 30 mL  30 mL Oral Q4H PRN Kristeen Mans, NP       chlordiazePOXIDE (LIBRIUM) capsule 25 mg  25 mg Oral TID Princess Bruins, DO       feeding supplement (BOOST / RESOURCE BREEZE) liquid 1 Container  1 Container Oral TID BM Princess Bruins, DO   1 Container at  01/24/24 1423   gabapentin (NEURONTIN) capsule 300 mg  300 mg Oral TID Kristeen Mans, NP   300 mg at 01/24/24 1007   hydrOXYzine (ATARAX) tablet 50 mg  50 mg Oral TID PRN Kristeen Mans, NP       loperamide (IMODIUM) capsule 2-4 mg  2-4 mg Oral PRN Kristeen Mans, NP       LORazepam (ATIVAN) tablet 1 mg  1 mg Oral Q6H PRN Kristeen Mans, NP       magnesium hydroxide (MILK OF MAGNESIA) suspension 30 mL  30 mL Oral Daily PRN Kristeen Mans, NP       magnesium oxide (MAG-OX) tablet  400 mg  400 mg Oral BID Roselle Locus, MD   400 mg at 01/24/24 1226   [START ON 01/25/2024] multivitamin with minerals tablet 1 tablet  1 tablet Oral Mignon Pine, Raynelle Fanning, DO       OLANZapine [facility]) injection 5 mg  5 mg Intramuscular TID PRN Kristeen Mans, NP       OLANZapine zydis (ZYPREXA) disintegrating tablet 5 mg  5 mg Oral TID PRN Kristeen Mans, NP       prazosin (MINIPRESS) capsule 1 mg  1 mg Oral QHS Princess Bruins, DO       QUEtiapine (SEROQUEL) tablet 400 mg  400 mg Oral QHS Alberteen Sam E, NP   400 mg at 01/23/24 2110   [START ON 01/25/2024] QUEtiapine (SEROQUEL) tablet 50 mg  50 mg Oral Mignon Pine, Raynelle Fanning, DO       [START ON 01/25/2024] thiamine (VITAMIN B1) tablet 100 mg  100 mg Oral Mignon Pine, Raynelle Fanning, DO       traZODone (DESYREL) tablet 200 mg  200 mg Oral QHS Alberteen Sam E, NP   200 mg at 01/23/24 2110   [START ON 01/25/2024] vitamin B-12 (CYANOCOBALAMIN) tablet 100 mcg  100 mcg Oral Mal Amabile, DO       Current Outpatient Medications  Medication Sig Dispense Refill   Calcium Carbonate Antacid (CALCIUM CARBONATE PO) Take 1 tablet by mouth at bedtime.     [START ON 01/30/2024] FLUoxetine (PROZAC) 40 MG capsule Take 1 capsule (40 mg total) by mouth daily. 30 capsule 1   gabapentin (NEURONTIN) 300 MG capsule Take 1 capsule (300 mg total) by mouth 3 (three) times daily. 90 capsule 1   hydrOXYzine (ATARAX) 50 MG tablet Take 1 tablet (50 mg total) by mouth 3 (three) times daily as needed. (Patient taking differently: Take 50 mg by mouth in the morning, at noon, and at bedtime.) 75 tablet 1   meloxicam (MOBIC) 15 MG tablet Take 1 tablet (15 mg total) by mouth daily. Take with food (Patient taking differently: Take 15 mg by mouth daily as needed for pain.) 15 tablet 0   pantoprazole (PROTONIX) 40 MG tablet Take 1 tablet by mouth once daily (Patient taking differently: Take 40 mg by mouth at bedtime.) 30 tablet 0   promethazine (PHENERGAN) 12.5 MG tablet Take 1 tablet  (12.5 mg total) by mouth every 8 (eight) hours as needed for nausea or vomiting. 20 tablet 0   QUEtiapine (SEROQUEL) 400 MG tablet Take 1 tablet (400 mg total) by mouth at bedtime. 30 tablet 1   QUEtiapine (SEROQUEL) 50 MG tablet Take 1 tablet (50 mg total) by mouth daily. 30 tablet 1   traZODone (DESYREL) 100 MG tablet Take 2 tablets (200 mg total) by mouth at bedtime. 60 tablet 1   TURMERIC  PO Take 1 capsule by mouth at bedtime.      Labs  Lab Results:  Admission on 01/22/2024  Component Date Value Ref Range Status   Acetaminophen (Tylenol), Serum 01/24/2024 <10 (L)  10 - 30 ug/mL Final   Comment: (NOTE) Therapeutic concentrations vary significantly. A range of 10-30 ug/mL  may be an effective concentration for many patients. However, some  are best treated at concentrations outside of this range. Acetaminophen concentrations >150 ug/mL at 4 hours after ingestion  and >50 ug/mL at 12 hours after ingestion are often associated with  toxic reactions.  Performed at [facility], [address], [place], [place] [address]    Sodium 01/24/2024 141  135 - 145 mmol/L Final   Potassium 01/24/2024 4.0  3.5 - 5.1 mmol/L Final   Chloride 01/24/2024 104  98 - 111 mmol/L Final   CO2 01/24/2024 26  22 - 32 mmol/L Final   Glucose, Bld 01/24/2024 81  70 - 99 mg/dL Final   Glucose reference range applies only to samples taken after fasting for at least 8 hours.   BUN 01/24/2024 8  6 - 20 mg/dL Final   Creatinine, Ser 01/24/2024 0.74  0.44 - 1.00 mg/dL Final   Calcium 60/45/4098 9.6  8.9 - 10.3 mg/dL Final   Total Protein 11/91/4782 5.6 (L)  6.5 - 8.1 g/dL Final   Albumin 95/62/1308 3.1 (L)  3.5 - 5.0 g/dL Final   AST 65/78/4696 19  15 - 41 U/L Final   ALT 01/24/2024 15  0 - 44 U/L Final   Alkaline Phosphatase 01/24/2024 70  38 - 126 U/L Final   Total Bilirubin 01/24/2024 0.5  0.0 - 1.2 mg/dL Final   GFR, Estimated 01/24/2024 >60  >60 mL/min Final   Comment: (NOTE) Calculated using the  CKD-EPI Creatinine Equation (2021)    Anion gap 01/24/2024 11  5 - 15 Final   Performed at [facility], [address] [place], [place] [address]   Magnesium 01/24/2024 1.3 (L)  1.7 - 2.4 mg/dL Final   Performed at [facility], [address] [place], [place] [address]  Admission on 01/22/2024, Discharged on 01/22/2024  Component Date Value Ref Range Status   WBC 01/22/2024 7.9  4.0 - 10.5 K/uL Final   RBC 01/22/2024 3.74 (L)  3.87 - 5.11 MIL/uL Final   Hemoglobin 01/22/2024 12.0  12.0 - 15.0 g/dL Final   HCT 40/07/2724 35.6 (L)  36.0 - 46.0 % Final   MCV 01/22/2024 95.2  80.0 - 100.0 fL Final   MCH 01/22/2024 32.1  26.0 - 34.0 pg Final   MCHC 01/22/2024 33.7  30.0 - 36.0 g/dL Final   RDW 36/64/4034 13.7  11.5 - 15.5 % Final   Platelets 01/22/2024 326  150 - 400 K/uL Final   nRBC 01/22/2024 0.0  0.0 - 0.2 % Final   Neutrophils Relative % 01/22/2024 60  % Final   Neutro Abs 01/22/2024 4.8  1.7 - 7.7 K/uL Final   Lymphocytes Relative 01/22/2024 31  % Final   Lymphs Abs 01/22/2024 2.5  0.7 - 4.0 K/uL Final   Monocytes Relative 01/22/2024 4  % Final   Monocytes Absolute 01/22/2024 0.3  0.1 - 1.0 K/uL Final   Eosinophils Relative 01/22/2024 4  % Final   Eosinophils Absolute 01/22/2024 0.3  0.0 - 0.5 K/uL Final   Basophils Relative 01/22/2024 1  % Final   Basophils Absolute 01/22/2024 0.0  0.0 - 0.1  K/uL Final   Immature Granulocytes 01/22/2024 0  % Final   Abs Immature Granulocytes 01/22/2024 0.02  0.00 - 0.07 K/uL Final   Performed at [facility], [address] [place], [place] [address]   Sodium 01/22/2024 141  135 - 145 mmol/L Final   Potassium 01/22/2024 4.5  3.5 - 5.1 mmol/L Final   Chloride 01/22/2024 104  98 - 111 mmol/L Final   CO2 01/22/2024 23  22 - 32 mmol/L Final   Glucose, Bld 01/22/2024 64 (L)  70 - 99 mg/dL Final   Glucose reference range applies only to samples taken after fasting for at least 8 hours.   BUN 01/22/2024 10  6 - 20 mg/dL Final    Creatinine, Ser 01/22/2024 0.80  0.44 - 1.00 mg/dL Final   Calcium 19/14/7829 9.7  8.9 - 10.3 mg/dL Final   Total Protein 56/21/3086 6.2 (L)  6.5 - 8.1 g/dL Final   Albumin 57/84/6962 3.6  3.5 - 5.0 g/dL Final   AST 95/28/4132 25  15 - 41 U/L Final   ALT 01/22/2024 20  0 - 44 U/L Final   Alkaline Phosphatase 01/22/2024 74  38 - 126 U/L Final   Total Bilirubin 01/22/2024 0.4  0.0 - 1.2 mg/dL Final   GFR, Estimated 01/22/2024 >60  >60 mL/min Final   Comment: (NOTE) Calculated using the CKD-EPI Creatinine Equation (2021)    Anion gap 01/22/2024 14  5 - 15 Final   Performed at [facility], [address] [place], [place] [address]   Alcohol, Ethyl (B) 01/22/2024 76 (H)  <10 mg/dL Final   Comment: (NOTE) Lowest detectable limit for serum alcohol is 10 mg/dL.  For medical purposes only. Performed at [facility], [address] [place], [place] [address]    POC Amphetamine UR 01/22/2024 Positive (A)  NONE DETECTED (Cut Off Level 1000 ng/mL) Final   POC Secobarbital (BAR) 01/22/2024 None Detected  NONE DETECTED (Cut Off Level 300 ng/mL) Final   POC Buprenorphine (BUP) 01/22/2024 None Detected  NONE DETECTED (Cut Off Level 10 ng/mL) Final   POC Oxazepam (BZO) 01/22/2024 Positive (A)  NONE DETECTED (Cut Off Level 300 ng/mL) Final   POC Cocaine UR 01/22/2024 None Detected  NONE DETECTED (Cut Off Level 300 ng/mL) Final   POC Methamphetamine UR 01/22/2024 Positive (A)  NONE DETECTED (Cut Off Level 1000 ng/mL) Final   POC Morphine 01/22/2024 None Detected  NONE DETECTED (Cut Off Level 300 ng/mL) Final   POC Methadone UR 01/22/2024 None Detected  NONE DETECTED (Cut Off Level 300 ng/mL) Final   POC Oxycodone UR 01/22/2024 Positive (A)  NONE DETECTED (Cut Off Level 100 ng/mL) Final   POC Marijuana UR 01/22/2024 Positive (A)  NONE DETECTED (Cut Off Level 50 ng/mL) Final   Magnesium 01/22/2024 1.5 (L)  1.7 - 2.4 mg/dL Final   Performed at [facility], [address],  [place], [place] [address]   Color, Urine 01/22/2024 YELLOW  YELLOW Final   APPearance 01/22/2024 CLEAR  CLEAR Final   Specific Gravity, Urine 01/22/2024 1.032 (H)  1.005 - 1.030 Final   pH 01/22/2024 5.0  5.0 - 8.0 Final   Glucose, UA 01/22/2024 NEGATIVE  NEGATIVE mg/dL Final   Hgb urine dipstick 01/22/2024 NEGATIVE  NEGATIVE Final   Bilirubin Urine 01/22/2024 NEGATIVE  NEGATIVE Final   Ketones, ur 01/22/2024 NEGATIVE  NEGATIVE mg/dL Final   Protein, ur 34/74/2595 NEGATIVE  NEGATIVE mg/dL Final   Nitrite 63/87/5643  NEGATIVE  NEGATIVE Final   Leukocytes,Ua 01/22/2024 TRACE (A)  NEGATIVE Final   RBC / HPF 01/22/2024 0-5  0 - 5 RBC/hpf Final   WBC, UA 01/22/2024 0-5  0 - 5 WBC/hpf Final   Bacteria, UA 01/22/2024 RARE (A)  NONE SEEN Final   Squamous Epithelial / HPF 01/22/2024 0-5  0 - 5 /HPF Final   Mucus 01/22/2024 PRESENT   Final   Hyaline Casts, UA 01/22/2024 PRESENT   Final   Performed at [facility], [address] [place], [place] [address]   Cholesterol 01/22/2024 225 (H)  0 - 200 mg/dL Final   Triglycerides 60/45/4098 285 (H)  <150 mg/dL Final   HDL 11/91/4782 79  >40 mg/dL Final   Total CHOL/HDL Ratio 01/22/2024 2.8  RATIO Final   VLDL 01/22/2024 57 (H)  0 - 40 mg/dL Final   LDL Cholesterol 01/22/2024 89  0 - 99 mg/dL Final   Comment:        Total Cholesterol/HDL:CHD Risk Coronary Heart Disease Risk Table                     Men   Women  1/2 Average Risk   3.4   3.3  Average Risk       5.0   4.4  2 X Average Risk   9.6   7.1  3 X Average Risk  23.4   11.0        Use the calculated Patient Ratio above and the CHD Risk Table to determine the patient's CHD Risk.        ATP III CLASSIFICATION (LDL):  <100     mg/dL   Optimal  956-213  mg/dL   Near or Above                    Optimal  130-159  mg/dL   Borderline  086-578  mg/dL   High  >469     mg/dL   Very High Performed at [facility], [address], [place], [place] [address]    TSH 01/22/2024 0.764   0.350 - 4.500 uIU/mL Final   Comment: Performed by a 3rd Generation assay with a functional sensitivity of <=0.01 uIU/mL. Performed at [facility], [address], [place], [place] [address]    Hgb A1c MFr Bld 01/22/2024 5.0  4.8 - 5.6 % Final   Comment: (NOTE) Pre diabetes:          5.7%-6.4%  Diabetes:              >6.4%  Glycemic control for   <7.0% adults with diabetes    Mean Plasma Glucose 01/22/2024 96.8  mg/dL Final   Performed at [facility], [address], [place], [place] [address]  Office Visit on 01/06/2024  Component Date Value Ref Range Status   WBC 01/06/2024 12.9 (H)  4.0 - 10.5 K/uL Final   RBC 01/06/2024 4.17  3.87 - 5.11 Mil/uL Final   Platelets 01/06/2024 354.0  150.0 - 400.0 K/uL Final   Hemoglobin 01/06/2024 13.4  12.0 - 15.0 g/dL Final   HCT 27/25/3664 41.7  36.0 - 46.0 % Final   MCV 01/06/2024 100.0  78.0 - 100.0 fl Final   MCHC 01/06/2024 32.3  30.0 - 36.0 g/dL Final   RDW 40/34/7425 14.4  11.5 - 15.5 % Final   Sodium 01/06/2024 138  135 - 145 mEq/L Final   Potassium 01/06/2024  3.9  3.5 - 5.1 mEq/L Final   Chloride 01/06/2024 100  96 - 112 mEq/L Final   CO2 01/06/2024 26  19 - 32 mEq/L Final   Glucose, Bld 01/06/2024 125 (H)  70 - 99 mg/dL Final   BUN 16/07/9603 17  6 - 23 mg/dL Final   Creatinine, Ser 01/06/2024 0.94  0.40 - 1.20 mg/dL Final   Total Bilirubin 01/06/2024 0.4  0.2 - 1.2 mg/dL Final   Alkaline Phosphatase 01/06/2024 105  39 - 117 U/L Final   AST 01/06/2024 21  0 - 37 U/L Final   ALT 01/06/2024 18  0 - 35 U/L Final   Total Protein 01/06/2024 7.1  6.0 - 8.3 g/dL Final   Albumin 54/06/8118 4.5  3.5 - 5.2 g/dL Final   GFR 14/78/2956 66.06  >60.00 mL/min Final   Calculated using the CKD-EPI Creatinine Equation (2021)   Calcium 01/06/2024 9.4  8.4 - 10.5 mg/dL Final   Lipase 21/30/8657 11.0  11.0 - 59.0 U/L Final   SARS Coronavirus 2 Ag 01/06/2024 Negative  Negative Final   Influenza A, POC 01/06/2024 Negative  Negative  Final   Influenza B, POC 01/06/2024 Negative  Negative Final  Admission on 12/17/2023, Discharged on 12/18/2023  Component Date Value Ref Range Status   WBC 12/17/2023 7.6  4.0 - 10.5 K/uL Final   RBC 12/17/2023 3.66 (L)  3.87 - 5.11 MIL/uL Final   Hemoglobin 12/17/2023 12.2  12.0 - 15.0 g/dL Final   HCT 84/69/6295 35.4 (L)  36.0 - 46.0 % Final   MCV 12/17/2023 96.7  80.0 - 100.0 fL Final   MCH 12/17/2023 33.3  26.0 - 34.0 pg Final   MCHC 12/17/2023 34.5  30.0 - 36.0 g/dL Final   RDW 28/41/3244 16.4 (H)  11.5 - 15.5 % Final   Platelets 12/17/2023 348  150 - 400 K/uL Final   nRBC 12/17/2023 0.0  0.0 - 0.2 % Final   Performed at [facility], [address] [place], [place] [address]   Sodium 12/17/2023 143  135 - 145 mmol/L Final   Potassium 12/17/2023 2.9 (L)  3.5 - 5.1 mmol/L Final   Chloride 12/17/2023 101  98 - 111 mmol/L Final   CO2 12/17/2023 27  22 - 32 mmol/L Final   Glucose, Bld 12/17/2023 99  70 - 99 mg/dL Final   Glucose reference range applies only to samples taken after fasting for at least 8 hours.   BUN 12/17/2023 18  6 - 20 mg/dL Final   Creatinine, Ser 12/17/2023 0.72  0.44 - 1.00 mg/dL Final   Calcium 25/36/6440 9.4  8.9 - 10.3 mg/dL Final   Total Protein 34/74/2595 6.7  6.5 - 8.1 g/dL Final   Albumin 63/87/5643 3.8  3.5 - 5.0 g/dL Final   AST 32/95/1884 36  15 - 41 U/L Final   ALT 12/17/2023 37  0 - 44 U/L Final   Alkaline Phosphatase 12/17/2023 94  38 - 126 U/L Final   Total Bilirubin 12/17/2023 0.3  0.0 - 1.2 mg/dL Final   GFR, Estimated 12/17/2023 >60  >60 mL/min Final   Comment: (NOTE) Calculated using the CKD-EPI Creatinine Equation (2021)    Anion gap 12/17/2023 15  5 - 15 Final   Performed at [facility], [address] [place], [place] [address]   Alcohol, Ethyl (B) 12/17/2023 194 (H)  <10 mg/dL Final   Comment: (NOTE) Lowest detectable limit for serum alcohol is 10 mg/dL.  For medical purposes  only. Performed at [facility], [address] [place], [place] [address]    Magnesium 12/17/2023 1.6 (L)  1.7 - 2.4 mg/dL Final   Performed at [facility], [address] [place], [place] [address]  Admission on 12/15/2023, Discharged on 12/15/2023  Component Date Value Ref Range Status   Sodium 12/15/2023 138  135 - 145 mmol/L Final   Potassium 12/15/2023 3.5  3.5 - 5.1 mmol/L Final   Chloride 12/15/2023 106  98 - 111 mmol/L Final   CO2 12/15/2023 19 (L)  22 - 32 mmol/L Final   Glucose, Bld 12/15/2023 106 (H)  70 - 99 mg/dL Final   Glucose reference range applies only to samples taken after fasting for at least 8 hours.   BUN 12/15/2023 27 (H)  6 - 20 mg/dL Final   Creatinine, Ser 12/15/2023 0.72  0.44 - 1.00 mg/dL Final   Calcium 63/87/5643 8.9  8.9 - 10.3 mg/dL Final   GFR, Estimated 12/15/2023 >60  >60 mL/min Final   Comment: (NOTE) Calculated using the CKD-EPI Creatinine Equation (2021)    Anion gap 12/15/2023 13  5 - 15 Final   Performed at [facility], [address] [place], [place] [address]   WBC 12/15/2023 9.5  4.0 - 10.5 K/uL Final   RBC 12/15/2023 3.43 (L)  3.87 - 5.11 MIL/uL Final   Hemoglobin 12/15/2023 10.9 (L)  12.0 - 15.0 g/dL Final   HCT 88/41/6606 32.8 (L)  36.0 - 46.0 % Final   MCV 12/15/2023 95.6  80.0 - 100.0 fL Final   MCH 12/15/2023 31.8  26.0 - 34.0 pg Final   MCHC 12/15/2023 33.2  30.0 - 36.0 g/dL Final   RDW 30/16/0109 16.9 (H)  11.5 - 15.5 % Final   Platelets 12/15/2023 363  150 - 400 K/uL Final   nRBC 12/15/2023 0.2  0.0 - 0.2 % Final   Performed at [facility], [address] [place], [place] [address]   Troponin I (High Sensitivity) 12/15/2023 <2  <18 ng/L Final   Comment: (NOTE) Elevated high sensitivity troponin I (hsTnI) values and significant  changes across serial measurements may suggest ACS but many other  chronic and acute conditions are known to elevate hsTnI results.  Refer to the "Links" section for chest pain  algorithms and additional  guidance. Performed at [facility], [address] [place], [place] [address]    Troponin I (High Sensitivity) 12/15/2023 3  <18 ng/L Final   Comment: (NOTE) Elevated high sensitivity troponin I (hsTnI) values and significant  changes across serial measurements may suggest ACS but many other  chronic and acute conditions are known to elevate hsTnI results.  Refer to the "Links" section for chest pain algorithms and additional  guidance. Performed at [facility], [address] [place], [place] [address]   Clinical Support on 10/01/2023  Component Date Value Ref Range Status   SARS Coronavirus 2 Ag 10/01/2023 Negative  Negative Final  Admission on 09/24/2023, Discharged on 09/25/2023  Component Date Value Ref Range Status   WBC 09/24/2023 5.5  4.0 - 10.5 K/uL Final   RBC 09/24/2023 3.71 (L)  3.87 - 5.11 MIL/uL Final   Hemoglobin 09/24/2023 10.7 (L)  12.0 - 15.0 g/dL Final   HCT 04/12/7627 33.4 (L)  36.0 - 46.0 % Final   MCV 09/24/2023 90.0  80.0 - 100.0 fL Final   MCH 09/24/2023 28.8  26.0 - 34.0 pg Final   MCHC 09/24/2023 32.0  30.0 - 36.0 g/dL Final   RDW 19/14/7829 15.9 (H)  11.5 - 15.5 % Final   Platelets 09/24/2023 318  150 - 400 K/uL Final   nRBC 09/24/2023 0.0  0.0 - 0.2 % Final   Neutrophils Relative % 09/24/2023 75  % Final   Neutro Abs 09/24/2023 4.1  1.7 - 7.7 K/uL Final   Lymphocytes Relative 09/24/2023 21  % Final   Lymphs Abs 09/24/2023 1.2  0.7 - 4.0 K/uL Final   Monocytes Relative 09/24/2023 3  % Final   Monocytes Absolute 09/24/2023 0.2  0.1 - 1.0 K/uL Final   Eosinophils Relative 09/24/2023 1  % Final   Eosinophils Absolute 09/24/2023 0.1  0.0 - 0.5 K/uL Final   Basophils Relative 09/24/2023 0  % Final   Basophils Absolute 09/24/2023 0.0  0.0 - 0.1 K/uL Final   Immature Granulocytes 09/24/2023 0  % Final   Abs Immature Granulocytes 09/24/2023 0.01  0.00 - 0.07 K/uL Final   Performed at [facility], [address], [place], [place] [address]   Sodium 09/24/2023 142  135 - 145 mmol/L Final   Potassium 09/24/2023 4.1  3.5 - 5.1 mmol/L Final   Chloride 09/24/2023 110  98 - 111 mmol/L Final   CO2 09/24/2023 18 (L)  22 - 32 mmol/L Final   Glucose, Bld 09/24/2023 66 (L)  70 - 99 mg/dL Final   Glucose reference range applies only to samples taken after fasting for at least 8 hours.   BUN 09/24/2023 8  6 - 20 mg/dL Final   Creatinine, Ser 09/24/2023 0.84  0.44 - 1.00 mg/dL Final   Calcium 08/65/7846 8.7 (L)  8.9 - 10.3 mg/dL Final   Total Protein 96/29/5284 5.7 (L)  6.5 - 8.1 g/dL Final   Albumin 13/24/4010 3.3 (L)  3.5 - 5.0 g/dL Final   AST 27/25/3664 25  15 - 41 U/L Final   ALT 09/24/2023 12  0 - 44 U/L Final   Alkaline Phosphatase 09/24/2023 90  38 - 126 U/L Final   Total Bilirubin 09/24/2023 0.3  <1.2 mg/dL Final   GFR, Estimated 09/24/2023 >60  >60 mL/min Final   Comment: (NOTE) Calculated using the CKD-EPI Creatinine Equation (2021)    Anion gap 09/24/2023 14  5 - 15 Final   Performed at [facility], [address] [place], [place] [address]   Acetaminophen (Tylenol), Serum 09/24/2023 <10 (L)  10 - 30 ug/mL Final   Comment: (NOTE) Therapeutic concentrations vary significantly. A range of 10-30 ug/mL  may be an effective concentration for many patients. However, some  are best treated at concentrations outside of this range. Acetaminophen concentrations >150 ug/mL at 4 hours after ingestion  and >50 ug/mL at 12 hours after ingestion are often associated with  toxic reactions.  Performed at [facility], [address], [place], [place] [address]    Salicylate Lvl 09/24/2023 <7.0 (L)  7.0 - 30.0 mg/dL Final   Performed at [facility], [address] [place], [place] [address]   Opiates 09/24/2023 NONE DETECTED  NONE DETECTED Final   Cocaine 09/24/2023 NONE DETECTED  NONE DETECTED Final   Benzodiazepines 09/24/2023 NONE DETECTED  NONE DETECTED Final    Amphetamines 09/24/2023 POSITIVE (A)  NONE DETECTED Final   Tetrahydrocannabinol 09/24/2023 POSITIVE (A)  NONE DETECTED Final   Barbiturates 09/24/2023 NONE DETECTED  NONE DETECTED Final   Comment: (NOTE) DRUG SCREEN FOR MEDICAL PURPOSES ONLY.  IF CONFIRMATION  IS NEEDED FOR ANY PURPOSE, NOTIFY LAB WITHIN 5 DAYS.  LOWEST DETECTABLE LIMITS FOR URINE DRUG SCREEN Drug Class                     Cutoff (ng/mL) Amphetamine and metabolites    1000 Barbiturate and metabolites    200 Benzodiazepine                 200 Opiates and metabolites        300 Cocaine and metabolites        300 THC                            50 Performed at [facility], [address] [place], [place] [address]    Alcohol, Ethyl (B) 09/24/2023 110 (H)  <10 mg/dL Final   Comment: (NOTE) Lowest detectable limit for serum alcohol is 10 mg/dL.  For medical purposes only. Performed at [facility], [address], [place], [place] [address]   Orders Only on 08/26/2023  Component Date Value Ref Range Status   Fecal Occult Bld 08/26/2023 Negative  Negative Final  Office Visit on 08/14/2023  Component Date Value Ref Range Status   WBC 08/14/2023 7.8  3.8 - 10.8 Thousand/uL Final   RBC 08/14/2023 3.47 (L)  3.80 - 5.10 Million/uL Final   Hemoglobin 08/14/2023 10.2 (L)  11.7 - 15.5 g/dL Final   HCT 09/81/1914 31.0 (L)  35.0 - 45.0 % Final   MCV 08/14/2023 89.3  80.0 - 100.0 fL Final   MCH 08/14/2023 29.4  27.0 - 33.0 pg Final   MCHC 08/14/2023 32.9  32.0 - 36.0 g/dL Final   Comment: For adults, a slight decrease in the calculated MCHC value (in the range of 30 to 32 g/dL) is most likely not clinically significant; however, it should be interpreted with caution in correlation with other red cell parameters and the patient's clinical condition.    RDW 08/14/2023 12.8  11.0 - 15.0 % Final   Platelets 08/14/2023 209  140 - 400 Thousand/uL Final   MPV 08/14/2023 9.4  7.5 - 12.5 fL Final   Glucose, Bld  08/14/2023 95  65 - 99 mg/dL Final   Comment: .            Fasting reference interval .    BUN 08/14/2023 20  7 - 25 mg/dL Final   Creat 78/29/5621 0.88  0.50 - 1.03 mg/dL Final   BUN/Creatinine Ratio 08/14/2023 SEE NOTE:  6 - 22 (calc) Final   Comment:    Not Reported: BUN and Creatinine are within    reference range. .    Sodium 08/14/2023 135  135 - 146 mmol/L Final   Potassium 08/14/2023 3.7  3.5 - 5.3 mmol/L Final   Chloride 08/14/2023 97 (L)  98 - 110 mmol/L Final   CO2 08/14/2023 29  20 - 32 mmol/L Final   Calcium 08/14/2023 9.3  8.6 - 10.4 mg/dL Final   Total Protein 30/86/5784 5.8 (L)  6.1 - 8.1 g/dL Final   Albumin 69/62/9528 3.7  3.6 - 5.1 g/dL Final   Globulin 41/32/4401 2.1  1.9 - 3.7 g/dL (calc) Final   AG Ratio 08/14/2023 1.8  1.0 - 2.5 (calc) Final   Total Bilirubin 08/14/2023 0.5  0.2 - 1.2 mg/dL Final   Alkaline phosphatase (APISO) 08/14/2023 115  37 - 153 U/L Final   AST 08/14/2023 31  10 - 35 U/L Final   ALT 08/14/2023 24  6 - 29 U/L Final   Vitamin B1 (Thiamine) 08/14/2023 <6 (L)  8 - 30 nmol/L Final   Comment: (Note) Vitamin supplementation within 24 hours prior to blood  draw may affect the accuracy of the results. . This test was developed and its analytical performance  characteristics have been determined by [employer]. It has not been cleared or approved by FDA.  This assay has been validated pursuant to the CLIA  regulations and is used for clinical purposes. . [facility] [address] [place] [address] [id number] Junita Push L. Thompson Caul, MD, PhD     Blood Alcohol level:  Lab Results  Component Value Date   ETH 76 (H) 01/22/2024   ETH 194 (H) 12/17/2023    Metabolic Disorder Labs: Lab Results  Component Value Date   HGBA1C 5.0 01/22/2024   MPG 96.8 01/22/2024   No results found for: "PROLACTIN" Lab Results  Component Value Date   CHOL 225 (H) 01/22/2024   TRIG 285 (H) 01/22/2024   HDL 79  01/22/2024   CHOLHDL 2.8 01/22/2024   VLDL 57 (H) 01/22/2024   LDLCALC 89 01/22/2024    Therapeutic Lab Levels: No results found for: "LITHIUM" No results found for: "VALPROATE" No results found for: "CBMZ"  Physical Findings   GAD-7    Flowsheet Row Video Visit from 12/31/2023 in [facility] Video Visit from 11/19/2023 in [facility] Video Visit from 08/26/2023 in [facility] Clinical Support from 07/14/2023 in [facility] Video Visit from 02/25/2023 in [facility]  Total GAD-7 Score 12 17 12 17 14       PHQ2-9    Flowsheet Row Video Visit from 12/31/2023 in [facility] Video Visit from 11/19/2023 in [facility] Video Visit from 08/26/2023 in [facility] Clinical Support from 07/14/2023 in [facility] Video Visit from 02/25/2023 in [facility]  PHQ-2 Total Score 6 6 5 6 6   PHQ-9 Total Score 26 26 24 27 19       Flowsheet Row ED from 01/22/2024 in [facility] Most recent reading at 01/22/2024  5:14 PM ED from 01/22/2024 in [facility] Most recent reading at 01/22/2024  1:23 PM Video Visit from 12/31/2023 in [facility] Most recent reading at 12/31/2023  4:16 PM  C-SSRS RISK CATEGORY Low Risk Low Risk Low Risk        Musculoskeletal  Strength & Muscle Tone: within normal limited Gait & Station: normal Patient leans: forward some Psychiatric Specialty Exam  Presentation General Appearance:Appropriate for Environment, Casual, Fairly Groomed (tremulous) Eye Contact:Good Speech:Clear and Coherent, Normal Rate (spontaneous) Volume:Normal Handedness:Right  Mood and Affect  Mood:Anxious, Depressed (she's missing her deceased  daughter) Affect:Appropriate, Congruent, Constricted, Depressed, Labile, Tearful  Thought Process  Thought Process:Coherent, Goal Directed, Linear Descriptions of Associations:Intact  Thought Content Suicidal Thoughts:No Homicidal Thoughts:No Hallucinations:None Ideas of Reference:None Thought Content:Rumination  Sensorium  Memory:Immediate Fair, Recent Fair Judgment:Fair Insight:Shallow  Executive Functions  Orientation:Full (Time, Place and Person) Language:Good Concentration:Good Attention:Good Recall:Fair Fund of Knowledge:Good  Psychomotor Activity  Psychomotor Activity:Psychomotor Activity: Tremor  Assets  Assets:Communication Skills, Desire for Improvement, Resilience, Housing, Talents/Skills, Social Support  Sleep  Quality:Poor (difficulties with staying asleep despite all of the sedating  rx she is taking now. She has recurrent nightmares about her deceased daughter multiple times a week. Had one last night) Physical Exam  Physical Exam Vitals and nursing note reviewed.  Constitutional:      General: She is not in acute distress.    Appearance: She is ill-appearing. She is not diaphoretic.  HENT:     Head: Normocephalic and atraumatic.  Eyes:     Conjunctiva/sclera: Conjunctivae normal.  Pulmonary:     Effort: Pulmonary effort is normal. No respiratory distress.  Neurological:     General: No focal deficit present.     Mental Status: She is alert and oriented to person, place, and time.     Gait: Gait normal.    Blood pressure 135/79, pulse 64, temperature 98.3 F (36.8 C), temperature source Oral, resp. rate 16, SpO2 100%. There is no height or weight on file to calculate BMI.  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 She is anxious and depressed and didn't sleep well. One of the triggers for her etoh use is missing her daughter. Reported having recurrent nightmares about her daughter, for this will start prazosin, however given her low BP, will monitor closely and dc it if it lowers her BP.  She wants to go home a bit earlier, so I switched the ativan taper to librium for the longer half-life and will see how she is doing withdrawal wise. No side effects to starting campral. Her Mag was a bit low, supplementing it per below Unfortunately even on high doses she is having significant insomnia, low appetite, anxiety and depressed sxs, exacerbated by etoh. I believe that she would benefit from a trial of remeron but I don't want to start her on a new rx given the amount of rx she is on, ideally would consider cross titration from trazodone to remeron slowly, however she is resistant to med changes, will defer to outpatient   AUD, severe (h/o seizure) Action stage.  Home Vivitrol 380 mg monthly (01/07/2024)  Continued campral 666 mg TID (new med) Continued home gabapentin 300 mg TID  CIWA with ativan PRN per protocol with thiamine & MV supplement CHANGED ativan taper to librium taper (ends 4/7)  MDD GAD Trauma and stressor-related disorder Nyquil misuse Insomnia 2/2 substances and mood d/o STARTED prazosin 1 mg qPM (new rx) Continued home seroquel 50 mg / 400 mg Continued home trazodone 200 mg qPM Continued home gabapentin 300 mg TID  Anorexia nervosa H/O B1  deficiency H/O bowel resection Hypomagnesemia  Continued B12 daily (new rx) Continued B1 daily (new rx) Continued MV daily (new rx) STARTED mag ox 400 mg BID (new rx) Recommend nutrition F/U with PCP  PRNs: acetaminophen, 650 mg, Q6H PRN alum & mag hydroxide-simeth, 30 mL, Q4H PRN hydrOXYzine, 50 mg, TID PRN loperamide, 2-4 mg, PRN LORazepam, 1 mg, Q6H PRN magnesium  hydroxide, 30 mL, Daily PRN OLANZapine, 5 mg, TID PRN OLANZapine zydis, 5 mg, TID PRN       Abnormal Labs Reviewed  ACETAMINOPHEN LEVEL - Abnormal; Notable for the following components:      Result Value   Acetaminophen (Tylenol), Serum <10 (*)    All other components within normal limits  COMPREHENSIVE METABOLIC PANEL WITH GFR - Abnormal; Notable for the following components:   Total Protein 5.6 (*)    Albumin 3.1 (*)    All other components within normal limits  MAGNESIUM - Abnormal; Notable for the following components:   Magnesium 1.3 (*)    All other components within normal limits    DISPO:  Tentative date: ~4/8 Location: home Follow-up/Recs: PCP - health maintenance, nutrition consult   Princess Bruins, DO Psych Resident, PGY-3 01/24/2024 3:36 PM

## 2024-01-25 NOTE — ED Notes (Signed)
 Carla Martinez A&Ox4. Denies intent to harm self/others when asked. Denies A/VH. Carla Martinez denies any physical complaints when asked. No acute distress noted. Encouragement provided this am as pt noted to have a poor appetite. Pt ate one cup cereal Support and encouragement provided. Routine safety checks conducted according to facility protocol. Encouraged Carla Martinez to notify staff if thoughts of harm toward self or others arise. Carla Martinez verbalize understanding and agreement. Will continue to monitor for safety.
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
## 2024-01-25 NOTE — ED Provider Notes (Cosign Needed Addendum)
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 Ethyl (B) 09/24/2023 110 (H)  <10 mg/dL Final   Comment: (NOTE) Lowest detectable limit for serum alcohol is 10 mg/dL.  For medical purposes only. Performed at [facility], [address] [place], [place] [address]   Orders Only on 08/26/2023  Component Date Value Ref Range Status   Fecal Occult Bld 08/26/2023 Negative  Negative Final  Office Visit on 08/14/2023  Component Date Value Ref Range Status   WBC 08/14/2023 7.8  3.8 - 10.8 Thousand/uL Final   RBC 08/14/2023 3.47 (L)  3.80 - 5.10 Million/uL Final   Hemoglobin  08/14/2023 10.2 (L)  11.7 - 15.5 g/dL Final   HCT 09/81/1914 31.0 (L)  35.0 - 45.0 % Final   MCV 08/14/2023 89.3  80.0 - 100.0 fL Final   MCH 08/14/2023 29.4  27.0 - 33.0 pg Final   MCHC 08/14/2023 32.9  32.0 - 36.0 g/dL Final   Comment: For adults, a slight decrease in the calculated MCHC value (in the range of 30 to 32 g/dL) is most likely not clinically significant; however, it should be interpreted with caution in correlation with other red cell parameters and the patient's clinical condition.    RDW 08/14/2023 12.8  11.0 - 15.0 % Final   Platelets 08/14/2023 209  140 - 400 Thousand/uL Final   MPV 08/14/2023 9.4  7.5 - 12.5 fL Final   Glucose, Bld 08/14/2023 95  65 - 99 mg/dL Final   Comment: .            Fasting reference interval .    BUN 08/14/2023 20  7 - 25 mg/dL Final   Creat 78/29/5621 0.88  0.50 - 1.03 mg/dL Final   BUN/Creatinine Ratio 08/14/2023 SEE NOTE:  6 - 22 (calc) Final   Comment:    Not Reported: BUN and Creatinine are within    reference range. .    Sodium 08/14/2023 135  135 - 146 mmol/L Final   Potassium 08/14/2023 3.7  3.5 - 5.3 mmol/L Final   Chloride 08/14/2023 97 (L)  98 - 110 mmol/L Final   CO2 08/14/2023 29  20 - 32 mmol/L Final   Calcium 08/14/2023 9.3  8.6 - 10.4 mg/dL Final   Total Protein 30/86/5784 5.8 (L)  6.1 - 8.1 g/dL Final   Albumin 69/62/9528 3.7  3.6 - 5.1 g/dL Final   Globulin 41/32/4401 2.1  1.9 - 3.7 g/dL (calc) Final   AG Ratio 08/14/2023 1.8  1.0 - 2.5 (calc) Final   Total Bilirubin 08/14/2023 0.5  0.2 - 1.2 mg/dL Final   Alkaline phosphatase (APISO) 08/14/2023 115  37 - 153 U/L Final   AST 08/14/2023 31  10 - 35 U/L Final   ALT 08/14/2023 24  6 - 29 U/L Final   Vitamin B1 (Thiamine) 08/14/2023 <6 (L)  8 - 30 nmol/L Final   Comment: (Note) Vitamin supplementation within 24 hours prior to blood  draw may affect the accuracy of the results. . This test was developed and its analytical performance  characteristics have been  determined by [employer]. It has not been cleared or approved by FDA.  This assay has been validated pursuant to the CLIA  regulations and is used for clinical purposes. . [facility] [address] [place] [address] [phone] Junita Push L. Thompson Caul, MD, PhD     Blood Alcohol level:  Lab Results  Component Value Date   ETH 76 (H) 01/22/2024   ETH 194 (H) 12/17/2023    Metabolic Disorder Labs: Lab Results  Component Value Date   HGBA1C 5.0 01/22/2024   MPG 96.8 01/22/2024   No results found for: "PROLACTIN" Lab Results  Component Value Date   CHOL 225 (H) 01/22/2024   TRIG 285 (H) 01/22/2024   HDL 79 01/22/2024   CHOLHDL 2.8 01/22/2024   VLDL 57 (H) 01/22/2024   LDLCALC 89 01/22/2024    Therapeutic Lab Levels: No results found for: "LITHIUM" No results found for: "VALPROATE" No results found for: "CBMZ"  Physical Findings   GAD-7    Flowsheet Row Video Visit from 12/31/2023 in [facility] Video Visit from 11/19/2023 in [facility] Video Visit from 08/26/2023 in [facility] Clinical Support from 07/14/2023 in [facility] Video Visit from 02/25/2023 in [facility]  Total GAD-7 Score 12 17 12 17 14       PHQ2-9    Flowsheet Row Video Visit from 12/31/2023 in [facility] Video Visit from 11/19/2023 in [facility] Video Visit from 08/26/2023 in [facility] Clinical Support from 07/14/2023 in [facility] Video Visit from 02/25/2023 in [facility]  PHQ-2 Total Score 6 6 5 6 6   PHQ-9 Total Score 26 26 24 27 19       Flowsheet Row ED from 01/22/2024 in [facility] Most recent reading at 01/22/2024  5:14 PM ED from 01/22/2024 in  [facility] Most recent reading at 01/22/2024  1:23 PM Video Visit from 12/31/2023 in [facility] Most recent reading at 12/31/2023  4:16 PM  C-SSRS RISK CATEGORY Low Risk Low Risk Low Risk        Musculoskeletal  Strength & Muscle Tone: within normal limited Gait & Station: normal Patient leans: forward some Psychiatric Specialty Exam  Presentation General Appearance:Appropriate for Environment, Fairly Groomed Eye Contact:Fair Speech:Clear and Coherent, Normal Rate Volume:Normal Handedness:Right  Mood and Affect  Mood:Depressed Affect:Congruent  Thought Process  Thought Process:Coherent, Linear Descriptions of Associations:Intact  Thought Content Suicidal Thoughts:No Homicidal Thoughts:No Hallucinations:None Ideas of Reference:None Thought Content:Logical, WDL  Sensorium  Memory:Remote Good Judgment:Fair Insight:Fair  Executive Functions  Orientation:Full (Time, Place and Person) Language:Good Concentration:Good Attention:Good Recall:Good Fund of Knowledge:Good  Psychomotor  Activity  Psychomotor Activity:Psychomotor Activity: Normal  Assets  Assets:Communication Skills, Resilience  Sleep  Quality:Fair Physical Exam  Physical Exam Vitals and nursing note reviewed.  Constitutional:      General: She is not in acute distress.    Appearance: She is ill-appearing. She is not diaphoretic.  HENT:     Head: Normocephalic and atraumatic.  Eyes:     Conjunctiva/sclera: Conjunctivae normal.  Pulmonary:     Effort: Pulmonary effort is normal. No respiratory distress.  Neurological:     General: No focal deficit present.     Mental Status: She is alert and oriented to person, place, and time.     Gait: Gait normal.    Blood pressure 110/72, pulse 68, temperature 98.1 F (36.7 C), temperature source Oral, resp. rate 16, weight 103 lb (46.7 kg), SpO2 96%. Body mass index is 17.68 kg/m.  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)   Generalized anxiety disorder   Moderate protein-calorie malnutrition (HCC)   Alcohol use disorder, severe, dependence (HCC)   Other psychoactive substance use, unsp with withdrawal, unsp (HCC)   History of seizure due to alcohol withdrawal   HO thiamine deficiency   Trauma and stressor-related disorder   AUD, severe (h/o seizure) CIWA most recently is 2 Home Vivitrol 380 mg monthly (01/07/2024)  Continued campral 666 mg TID (new med) Continued home gabapentin 300 mg TID  CIWA with ativan PRN per protocol with thiamine & MV supplement CHANGED ativan taper to librium taper (ends 4/7)  MDD GAD Trauma and stressor-related disorder Nyquil misuse Insomnia 2/2 substances and mood d/o D/c prazosin 1 mg qPM due to soft BP Continued home seroquel 50 mg / 400 mg Continued home trazodone 200 mg qPM Continued home gabapentin 300 mg TID  Anorexia nervosa H/O B1 deficiency H/O bowel resection Hypomagnesemia  Continued B12 daily (new rx) Continued B1 daily (new rx) Continued MV daily (new rx) STARTED mag ox 400 mg BID (new rx) Recommend nutrition F/U with PCP  PRNs: acetaminophen, 650 mg, Q6H PRN alum & mag hydroxide-simeth, 30 mL, Q4H PRN hydrOXYzine, 50 mg, TID PRN loperamide, 2-4 mg, PRN LORazepam, 1 mg, Q6H PRN magnesium hydroxide, 30 mL, Daily PRN OLANZapine, 5 mg, TID PRN OLANZapine zydis, 5 mg, TID PRN       Abnormal Labs Reviewed  ACETAMINOPHEN LEVEL - Abnormal; Notable for the following components:      Result Value   Acetaminophen (Tylenol), Serum <10 (*)    All other components within normal limits  COMPREHENSIVE METABOLIC PANEL WITH GFR - Abnormal; Notable for the following  components:   Total Protein 5.6 (*)    Albumin 3.1 (*)    All other components within normal limits  MAGNESIUM - Abnormal; Notable for the following components:   Magnesium 1.3 (*)    All other components within normal limits    DISPO:  Tentative date: ~4/8 Location: home Follow-up/Recs: PCP - health maintenance, nutrition consult   Lance Muss, MD Psych Resident, PGY-2 01/25/2024 1:26 PM
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
## 2024-01-25 NOTE — ED Notes (Signed)
Pt resting in no acute distress. RR even and unlabored. Environment secured. Will continue to monitor for safety.
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
## 2024-01-25 NOTE — ED Notes (Signed)
 Pt sleeping in no acute distress. RR even and unlabored. Environment secured. Will continue to monitor for safety.

## 2024-01-25 NOTE — ED Notes (Signed)
 Patient is sleeping. Respirations equal and unlabored, skin warm and dry. No change in assessment or acuity. Routine safety checks conducted according to facility protocol. Will continue to monitor for safety.

## 2024-01-25 NOTE — ED Notes (Signed)
Patient denies pain at present.

## 2024-01-25 NOTE — Tx Team (Signed)
 LCSW spoke with the patient on today regarding admission and disposition plans.  Patient reports she presented due to alcohol use and needing to detox.  Patient reports she resides at home with her mother and son.  Patient reports at this time she does not need any additional resources outside of a Christian-based therapist.  Patient reports her plan is to return back home with continue services with her current outpatient psychiatrist Eddie at the [facility].  Patient denies wanting any scheduled appointments arranged stating she will follow up on her own.  Patient aware that outpatient therapist resources will be provided in her AVS for her review.  No other needs were reported by the patient at this time.  MD provide an update and per MD patient will likely discharge later today.  Carla Boyden, LCSW Clinical Social Worker [facility] Ph: [phone]

## 2024-01-25 NOTE — ED Notes (Signed)
 Pt  is in the Dayroom calm and  composed watching TV with other patients. Denies SI/HI & AVH at the moment. NAD. Respirations are even and unlabored.  Will monitor for safety.

## 2024-01-26 ENCOUNTER — Other Ambulatory Visit (HOSPITAL_COMMUNITY): Payer: Self-pay | Admitting: Physician Assistant

## 2024-01-26 DIAGNOSIS — F339 Major depressive disorder, recurrent, unspecified: Secondary | ICD-10-CM

## 2024-01-26 DIAGNOSIS — F1020 Alcohol dependence, uncomplicated: Secondary | ICD-10-CM | POA: Diagnosis not present

## 2024-01-26 DIAGNOSIS — F411 Generalized anxiety disorder: Secondary | ICD-10-CM

## 2024-01-26 MED ORDER — GABAPENTIN 300 MG PO CAPS: 300.0000 mg | ORAL_CAPSULE | Freq: Three times a day (TID) | ORAL | 0 refills | Status: DC

## 2024-01-26 MED ORDER — QUETIAPINE FUMARATE 50 MG PO TABS: 50.0000 mg | ORAL_TABLET | Freq: Every day | ORAL | 0 refills | Status: DC

## 2024-01-26 MED ORDER — QUETIAPINE FUMARATE 400 MG PO TABS: 400.0000 mg | ORAL_TABLET | Freq: Every day | ORAL | 0 refills | Status: DC

## 2024-01-26 MED ORDER — ACAMPROSATE CALCIUM 333 MG PO TBEC: 666.0000 mg | DELAYED_RELEASE_TABLET | Freq: Three times a day (TID) | ORAL | 0 refills | Status: DC

## 2024-01-26 MED ORDER — TRAZODONE HCL 100 MG PO TABS: 200.0000 mg | ORAL_TABLET | Freq: Every day | ORAL | 0 refills | Status: DC

## 2024-01-26 MED ORDER — FLUOXETINE HCL 40 MG PO CAPS: 40.0000 mg | ORAL_CAPSULE | Freq: Every day | ORAL | 0 refills | Status: DC

## 2024-01-26 NOTE — ED Notes (Signed)
 Patient in the bedroom calm and composed sleeping comfortably with eyes closed. Marland Kitchen  Respirations are even and unlabored. Will monitor for safety
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
## 2024-01-26 NOTE — ED Notes (Signed)
 Patient calm and composed sleeping in the bedroom NAD.  Respirations are even and unlabored. Will monitor for safety

## 2024-01-26 NOTE — ED Notes (Signed)
 Patient is calm and cooperative with care.  No complaints or distress.  NO withdrawal.  Patient scheduled for discharge later today.
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
## 2024-01-27 ENCOUNTER — Encounter: Payer: Self-pay | Admitting: Gastroenterology

## 2024-01-27 LAB — VITAMIN B1: Vitamin B1 (Thiamine): 163.2 nmol/L (ref 66.5–200.0)

## 2024-01-28 ENCOUNTER — Encounter: Payer: Self-pay | Admitting: Gastroenterology

## 2024-01-28 ENCOUNTER — Ambulatory Visit: Admitting: Anesthesiology

## 2024-01-28 ENCOUNTER — Encounter
Admission: RE | Disposition: A | Discharge status: Home / Self Care | Payer: Self-pay | Source: Home / Self Care | Attending: Gastroenterology

## 2024-01-28 ENCOUNTER — Ambulatory Visit
Admission: RE | Admit: 2024-01-28 | Discharge: 2024-01-28 | Disposition: A | Discharge status: Home / Self Care | Payer: No Typology Code available for payment source | Attending: Gastroenterology | Admitting: Gastroenterology

## 2024-01-28 DIAGNOSIS — K623 Rectal prolapse: Secondary | ICD-10-CM

## 2024-01-28 DIAGNOSIS — D123 Benign neoplasm of transverse colon: Secondary | ICD-10-CM | POA: Insufficient documentation

## 2024-01-28 DIAGNOSIS — D128 Benign neoplasm of rectum: Secondary | ICD-10-CM | POA: Diagnosis not present

## 2024-01-28 DIAGNOSIS — K626 Ulcer of anus and rectum: Secondary | ICD-10-CM | POA: Diagnosis not present

## 2024-01-28 DIAGNOSIS — F17210 Nicotine dependence, cigarettes, uncomplicated: Secondary | ICD-10-CM | POA: Insufficient documentation

## 2024-01-28 DIAGNOSIS — I509 Heart failure, unspecified: Secondary | ICD-10-CM | POA: Insufficient documentation

## 2024-01-28 DIAGNOSIS — K219 Gastro-esophageal reflux disease without esophagitis: Secondary | ICD-10-CM | POA: Insufficient documentation

## 2024-01-28 DIAGNOSIS — D125 Benign neoplasm of sigmoid colon: Secondary | ICD-10-CM | POA: Insufficient documentation

## 2024-01-28 DIAGNOSIS — I2510 Atherosclerotic heart disease of native coronary artery without angina pectoris: Secondary | ICD-10-CM | POA: Diagnosis not present

## 2024-01-28 DIAGNOSIS — Z79899 Other long term (current) drug therapy: Secondary | ICD-10-CM | POA: Diagnosis not present

## 2024-01-28 DIAGNOSIS — K562 Volvulus: Secondary | ICD-10-CM | POA: Diagnosis not present

## 2024-01-28 DIAGNOSIS — K621 Rectal polyp: Secondary | ICD-10-CM | POA: Diagnosis not present

## 2024-01-28 DIAGNOSIS — Z860100 Personal history of colon polyps, unspecified: Secondary | ICD-10-CM

## 2024-01-28 DIAGNOSIS — Z1211 Encounter for screening for malignant neoplasm of colon: Secondary | ICD-10-CM | POA: Insufficient documentation

## 2024-01-28 DIAGNOSIS — D124 Benign neoplasm of descending colon: Secondary | ICD-10-CM | POA: Diagnosis not present

## 2024-01-28 HISTORY — PX: COLONOSCOPY WITH PROPOFOL: SHX5780

## 2024-01-28 HISTORY — PX: POLYPECTOMY: SHX149

## 2024-01-28 SURGERY — COLONOSCOPY WITH PROPOFOL
Anesthesia: General

## 2024-01-28 MED ORDER — PROPOFOL 10 MG/ML IV BOLUS
INTRAVENOUS | Status: DC | PRN
Start: 2024-01-28 — End: 2024-01-28
  Administered 2024-01-28: 50 mg via INTRAVENOUS

## 2024-01-28 MED ORDER — SODIUM CHLORIDE 0.9 % IV SOLN: INTRAVENOUS | Status: DC

## 2024-01-28 MED ORDER — PHENYLEPHRINE 80 MCG/ML (10ML) SYRINGE FOR IV PUSH (FOR BLOOD PRESSURE SUPPORT)
PREFILLED_SYRINGE | INTRAVENOUS | Status: DC | PRN
  Administered 2024-01-28 (×2): 80 ug via INTRAVENOUS

## 2024-01-28 MED ORDER — PROPOFOL 500 MG/50ML IV EMUL
INTRAVENOUS | Status: DC | PRN
  Administered 2024-01-28: 150 ug/kg/min via INTRAVENOUS

## 2024-01-28 MED ORDER — LIDOCAINE HCL (CARDIAC) PF 100 MG/5ML IV SOSY
PREFILLED_SYRINGE | INTRAVENOUS | Status: DC | PRN
  Administered 2024-01-28: 40 mg via INTRAVENOUS

## 2024-01-28 MED ORDER — EPHEDRINE SULFATE (PRESSORS) 50 MG/ML IJ SOLN
INTRAMUSCULAR | Status: DC | PRN
  Administered 2024-01-28 (×4): 5 mg via INTRAVENOUS
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
## 2024-01-28 NOTE — Op Note (Signed)
 [facility] Gastroenterology Patient Name: Carla Martinez Procedure Date: 01/28/2024 1:47 PM MRN: [id number] Account #: [id number] Date of Birth: 1961/06/30 Admit Type: Outpatient Age: 63 Room: [facility] ENDO ROOM 3 Gender: Female Note Status: Finalized Instrument Name: Peds Colonoscope [id number] Procedure:             Colonoscopy Indications:           Screening for colorectal malignant neoplasm, Screening                         for colorectal malignant neoplasm, inadequate bowel                         prep on last colonoscopy (more recent than 10 years                         ago), Last colonoscopy: May 2024 Providers:             Toney Reil MD, MD Referring MD:          Toney Reil MD, MD (Referring MD), Genene Churn.                         Toney Reil (Referring MD) Medicines:             General Anesthesia Complications:         No immediate complications. Estimated blood loss: None. Procedure:             Pre-Anesthesia Assessment:                        - Prior to the procedure, a History and Physical was                         performed, and patient medications and allergies were                         reviewed. The patient is competent. The risks and                         benefits of the procedure and the sedation options and                         risks were discussed with the patient. All questions                         were answered and informed consent was obtained.                         Patient identification and proposed procedure were                         verified by the physician, the nurse, the                         anesthesiologist, the anesthetist and the technician                         in the pre-procedure area in the procedure room in the  endoscopy suite. Mental Status Examination: alert and                         oriented. Airway Examination: normal oropharyngeal                         airway and neck  mobility. Respiratory Examination:                         clear to auscultation. CV Examination: normal.                         Prophylactic Antibiotics: The patient does not require                         prophylactic antibiotics. Prior Anticoagulants: The                         patient has taken no anticoagulant or antiplatelet                         agents. ASA Grade Assessment: III - A patient with                         severe systemic disease. After reviewing the risks and                         benefits, the patient was deemed in satisfactory                         condition to undergo the procedure. The anesthesia                         plan was to use general anesthesia. Immediately prior                         to administration of medications, the patient was                         re-assessed for adequacy to receive sedatives. The                         heart rate, respiratory rate, oxygen saturations,                         blood pressure, adequacy of pulmonary ventilation, and                         response to care were monitored throughout the                         procedure. The physical status of the patient was                         re-assessed after the procedure.                        After obtaining informed consent, the colonoscope was  passed under direct vision. Throughout the procedure,                         the patient's blood pressure, pulse, and oxygen                         saturations were monitored continuously. The                         Colonoscope was introduced through the anus and                         advanced to the the cecum, identified by appendiceal                         orifice and ileocecal valve. The colonoscopy was                         performed with moderate difficulty due to significant                         looping. Successful completion of the procedure was                         aided  by applying abdominal pressure. The patient                         tolerated the procedure well. The quality of the bowel                         preparation was evaluated using the BBPS [facility] Bowel                         Preparation Scale) with scores of: Right Colon = 3,                         Transverse Colon = 3 and Left Colon = 3 (entire mucosa                         seen well with no residual staining, small fragments                         of stool or opaque liquid). The total BBPS score                         equals 9. The ileocecal valve, appendiceal orifice,                         and rectum were photographed. Findings:      The perianal and digital rectal examinations were normal. Pertinent       negatives include normal sphincter tone and no palpable rectal lesions.      Eight sessile polyps were found in the rectum 1, sigmoid colon 2,       descending colon 4 and transverse colon 1. The polyps were 3 to 6 mm in       size. These polyps were removed with a cold snare. Resection and  retrieval were complete. Estimated blood loss was minimal.      A single (solitary) five mm healing linear ulcer was found in the distal       rectum. No bleeding was present. No stigmata of recent bleeding were       seen. Biopsies were taken with a cold forceps for histology.      The retroflexed view of the distal rectum and anal verge was normal and       showed no anal or rectal abnormalities. Impression:            - Eight 3 to 6 mm polyps in the rectum, in the sigmoid                         colon, in the descending colon and in the transverse                         colon, removed with a cold snare. Resected and                         retrieved.                        - A single (solitary) ulcer in the distal rectum.                         Biopsied.                        - The distal rectum and anal verge are normal on                         retroflexion  view. Recommendation:        - Discharge patient to home (with escort).                        - Resume previous diet today.                        - Continue present medications.                        - Await pathology results.                        - Repeat colonoscopy in 3 - 5 years for surveillance                         of multiple polyps. Procedure Code(s):     --- Professional ---                        [phone], Colonoscopy, flexible; with removal of                         tumor(s), polyp(s), or other lesion(s) by snare                         technique                        45380, 59, Colonoscopy, flexible; with  biopsy, single                         or multiple Diagnosis Code(s):     --- Professional ---                        D12.8, Benign neoplasm of rectum                        D12.5, Benign neoplasm of sigmoid colon                        D12.4, Benign neoplasm of descending colon                        D12.3, Benign neoplasm of transverse colon (hepatic                         flexure or splenic flexure)                        Z12.11, Encounter for screening for malignant neoplasm                         of colon                        K62.6, Ulcer of anus and rectum CPT copyright 2022 American Medical Association. All rights reserved. The codes documented in this report are preliminary and upon coder review may  be revised to meet current compliance requirements. Dr. Libby Maw Toney Reil MD, MD 01/28/2024 2:41:14 PM This report has been signed electronically. Number of Addenda: 0 Note Initiated On: 01/28/2024 1:47 PM Scope Withdrawal Time: 0 hours 14 minutes 33 seconds  Total Procedure Duration: 0 hours 18 minutes 20 seconds  Estimated Blood Loss:  Estimated blood loss: none.      [facility]

## 2024-01-28 NOTE — Transfer of Care (Signed)
 Immediate Anesthesia Transfer of Care Note  Patient: Carla Martinez  Procedure(s) Performed: COLONOSCOPY WITH PROPOFOL POLYPECTOMY, INTESTINE  Patient Location: Endoscopy Unit  Anesthesia Type:General  Level of Consciousness: alert  and patient cooperative  Airway & Oxygen Therapy: Patient Spontanous Breathing  Post-op Assessment: Report given to RN and Post -op Vital signs reviewed and stable  Post vital signs: Reviewed and stable  Last Vitals:  Vitals Value Taken Time  BP 94/51 01/28/24 1442  Temp 35.9 C 01/28/24 1442  Pulse 66 01/28/24 1444  Resp 15 01/28/24 1444  SpO2 100 % 01/28/24 1444  Vitals shown include unfiled device data.  Last Pain:  Vitals:   01/28/24 1442  TempSrc: Temporal  PainSc: Asleep         Complications: No notable events documented.

## 2024-01-29 ENCOUNTER — Encounter: Payer: Self-pay | Admitting: Gastroenterology

## 2024-01-29 LAB — SURGICAL PATHOLOGY

## 2024-02-01 ENCOUNTER — Encounter: Payer: Self-pay | Admitting: Gastroenterology

## 2024-02-04 ENCOUNTER — Ambulatory Visit (HOSPITAL_COMMUNITY)

## 2024-02-04 NOTE — Anesthesia Postprocedure Evaluation (Signed)
 Anesthesia Post Note  Patient: Carla Martinez  Procedure(s) Performed: COLONOSCOPY WITH PROPOFOL POLYPECTOMY, INTESTINE  Patient location during evaluation: PACU Anesthesia Type: General Level of consciousness: awake Pain management: pain level controlled Vital Signs Assessment: post-procedure vital signs reviewed and stable Respiratory status: spontaneous breathing Cardiovascular status: stable Anesthetic complications: no   No notable events documented.   Last Vitals:  Vitals:   01/28/24 1452 01/28/24 1502  BP: 112/66 116/78  Pulse: 68   Resp: 16   Temp:    SpO2: 97% 97%    Last Pain:  Vitals:   01/28/24 1452  TempSrc:   PainSc: 0-No pain                 VAN STAVEREN,Celes Dedic

## 2024-02-08 ENCOUNTER — Other Ambulatory Visit: Payer: Self-pay | Admitting: Nurse Practitioner

## 2024-02-08 DIAGNOSIS — K219 Gastro-esophageal reflux disease without esophagitis: Secondary | ICD-10-CM

## 2024-02-11 ENCOUNTER — Ambulatory Visit (HOSPITAL_COMMUNITY)

## 2024-02-12 ENCOUNTER — Telehealth (HOSPITAL_COMMUNITY): Payer: Self-pay | Admitting: Physician Assistant

## 2024-02-12 ENCOUNTER — Encounter (HOSPITAL_COMMUNITY): Payer: Self-pay | Admitting: Physician Assistant

## 2024-02-12 DIAGNOSIS — G4700 Insomnia, unspecified: Secondary | ICD-10-CM

## 2024-02-12 DIAGNOSIS — F411 Generalized anxiety disorder: Secondary | ICD-10-CM | POA: Diagnosis not present

## 2024-02-12 DIAGNOSIS — F1090 Alcohol use, unspecified, uncomplicated: Secondary | ICD-10-CM

## 2024-02-12 DIAGNOSIS — F339 Major depressive disorder, recurrent, unspecified: Secondary | ICD-10-CM | POA: Diagnosis not present

## 2024-02-12 DIAGNOSIS — Z634 Disappearance and death of family member: Secondary | ICD-10-CM

## 2024-02-12 MED ORDER — GABAPENTIN 300 MG PO CAPS: 300.0000 mg | ORAL_CAPSULE | Freq: Three times a day (TID) | ORAL | 1 refills | Status: DC

## 2024-02-12 MED ORDER — FLUOXETINE HCL 40 MG PO CAPS: 40.0000 mg | ORAL_CAPSULE | Freq: Every day | ORAL | 1 refills | Status: DC

## 2024-02-12 MED ORDER — QUETIAPINE FUMARATE 400 MG PO TABS: 400.0000 mg | ORAL_TABLET | Freq: Every day | ORAL | 1 refills | Status: DC

## 2024-02-12 MED ORDER — TRAZODONE HCL 100 MG PO TABS: 200.0000 mg | ORAL_TABLET | Freq: Every day | ORAL | 1 refills | Status: DC

## 2024-02-12 MED ORDER — ACAMPROSATE CALCIUM 333 MG PO TBEC: 666.0000 mg | DELAYED_RELEASE_TABLET | Freq: Three times a day (TID) | ORAL | 1 refills | Status: DC

## 2024-02-12 MED ORDER — QUETIAPINE FUMARATE 50 MG PO TABS: 50.0000 mg | ORAL_TABLET | Freq: Every day | ORAL | 1 refills | Status: DC
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
## 2024-02-15 ENCOUNTER — Inpatient Hospital Stay (HOSPITAL_COMMUNITY)
Admission: EM | Admit: 2024-02-15 | Discharge: 2024-02-23 | DRG: 897 | Disposition: A | Discharge status: Home / Self Care | Source: Intra-hospital | Attending: Internal Medicine | Admitting: Internal Medicine

## 2024-02-15 ENCOUNTER — Other Ambulatory Visit: Payer: Self-pay

## 2024-02-15 ENCOUNTER — Ambulatory Visit (HOSPITAL_COMMUNITY): Admission: EM | Admit: 2024-02-15 | Discharge: 2024-02-15 | Disposition: A | Discharge status: Home / Self Care

## 2024-02-15 ENCOUNTER — Emergency Department (HOSPITAL_COMMUNITY)

## 2024-02-15 ENCOUNTER — Other Ambulatory Visit (HOSPITAL_COMMUNITY): Payer: Self-pay | Admitting: Physician Assistant

## 2024-02-15 DIAGNOSIS — Z8701 Personal history of pneumonia (recurrent): Secondary | ICD-10-CM

## 2024-02-15 DIAGNOSIS — F17210 Nicotine dependence, cigarettes, uncomplicated: Secondary | ICD-10-CM | POA: Diagnosis present

## 2024-02-15 DIAGNOSIS — D638 Anemia in other chronic diseases classified elsewhere: Secondary | ICD-10-CM | POA: Diagnosis present

## 2024-02-15 DIAGNOSIS — Z8279 Family history of other congenital malformations, deformations and chromosomal abnormalities: Secondary | ICD-10-CM

## 2024-02-15 DIAGNOSIS — I2510 Atherosclerotic heart disease of native coronary artery without angina pectoris: Secondary | ICD-10-CM | POA: Diagnosis present

## 2024-02-15 DIAGNOSIS — Z681 Body mass index (BMI) 19 or less, adult: Secondary | ICD-10-CM

## 2024-02-15 DIAGNOSIS — F10231 Alcohol dependence with withdrawal delirium: Principal | ICD-10-CM | POA: Diagnosis present

## 2024-02-15 DIAGNOSIS — S42009A Fracture of unspecified part of unspecified clavicle, initial encounter for closed fracture: Secondary | ICD-10-CM | POA: Diagnosis present

## 2024-02-15 DIAGNOSIS — F1090 Alcohol use, unspecified, uncomplicated: Secondary | ICD-10-CM

## 2024-02-15 DIAGNOSIS — E876 Hypokalemia: Secondary | ICD-10-CM

## 2024-02-15 DIAGNOSIS — F411 Generalized anxiety disorder: Secondary | ICD-10-CM | POA: Diagnosis present

## 2024-02-15 DIAGNOSIS — R4182 Altered mental status, unspecified: Secondary | ICD-10-CM | POA: Diagnosis not present

## 2024-02-15 DIAGNOSIS — F10221 Alcohol dependence with intoxication delirium: Secondary | ICD-10-CM | POA: Diagnosis present

## 2024-02-15 DIAGNOSIS — F10930 Alcohol use, unspecified with withdrawal, uncomplicated: Principal | ICD-10-CM

## 2024-02-15 DIAGNOSIS — Z9049 Acquired absence of other specified parts of digestive tract: Secondary | ICD-10-CM

## 2024-02-15 DIAGNOSIS — S2241XA Multiple fractures of ribs, right side, initial encounter for closed fracture: Secondary | ICD-10-CM | POA: Diagnosis present

## 2024-02-15 DIAGNOSIS — R636 Underweight: Secondary | ICD-10-CM | POA: Diagnosis present

## 2024-02-15 DIAGNOSIS — Z806 Family history of leukemia: Secondary | ICD-10-CM

## 2024-02-15 DIAGNOSIS — I480 Paroxysmal atrial fibrillation: Secondary | ICD-10-CM | POA: Diagnosis present

## 2024-02-15 DIAGNOSIS — Z79899 Other long term (current) drug therapy: Secondary | ICD-10-CM

## 2024-02-15 DIAGNOSIS — R262 Difficulty in walking, not elsewhere classified: Secondary | ICD-10-CM | POA: Diagnosis present

## 2024-02-15 DIAGNOSIS — F10939 Alcohol use, unspecified with withdrawal, unspecified: Secondary | ICD-10-CM | POA: Diagnosis not present

## 2024-02-15 DIAGNOSIS — F17290 Nicotine dependence, other tobacco product, uncomplicated: Secondary | ICD-10-CM | POA: Diagnosis present

## 2024-02-15 DIAGNOSIS — Z825 Family history of asthma and other chronic lower respiratory diseases: Secondary | ICD-10-CM

## 2024-02-15 DIAGNOSIS — D649 Anemia, unspecified: Secondary | ICD-10-CM | POA: Insufficient documentation

## 2024-02-15 DIAGNOSIS — F322 Major depressive disorder, single episode, severe without psychotic features: Secondary | ICD-10-CM | POA: Diagnosis present

## 2024-02-15 DIAGNOSIS — Z791 Long term (current) use of non-steroidal anti-inflammatories (NSAID): Secondary | ICD-10-CM

## 2024-02-15 DIAGNOSIS — W19XXXA Unspecified fall, initial encounter: Secondary | ICD-10-CM | POA: Diagnosis present

## 2024-02-15 DIAGNOSIS — Z823 Family history of stroke: Secondary | ICD-10-CM

## 2024-02-15 DIAGNOSIS — K219 Gastro-esophageal reflux disease without esophagitis: Secondary | ICD-10-CM | POA: Diagnosis present

## 2024-02-15 DIAGNOSIS — R64 Cachexia: Secondary | ICD-10-CM | POA: Diagnosis present

## 2024-02-15 DIAGNOSIS — Z833 Family history of diabetes mellitus: Secondary | ICD-10-CM

## 2024-02-15 DIAGNOSIS — R5381 Other malaise: Secondary | ICD-10-CM

## 2024-02-15 DIAGNOSIS — Z8249 Family history of ischemic heart disease and other diseases of the circulatory system: Secondary | ICD-10-CM

## 2024-02-15 LAB — COMPREHENSIVE METABOLIC PANEL WITH GFR
ALT: 14 U/L (ref 0–44)
AST: 26 U/L (ref 15–41)
Albumin: 3.5 g/dL (ref 3.5–5.0)
Alkaline Phosphatase: 72 U/L (ref 38–126)
Anion gap: 13 (ref 5–15)
BUN: 14 mg/dL (ref 6–20)
CO2: 19 mmol/L — ABNORMAL LOW (ref 22–32)
Calcium: 9.1 mg/dL (ref 8.9–10.3)
Chloride: 109 mmol/L (ref 98–111)
Creatinine, Ser: 0.85 mg/dL (ref 0.44–1.00)
GFR, Estimated: >60 mL/min (ref >60)
Glucose, Bld: 101 mg/dL — ABNORMAL HIGH (ref 70–99)
Potassium: 3.1 mmol/L — ABNORMAL LOW (ref 3.5–5.1)
Sodium: 141 mmol/L (ref 135–145)
Total Bilirubin: 0.7 mg/dL (ref 0.0–1.2)
Total Protein: 6.1 g/dL — ABNORMAL LOW (ref 6.5–8.1)

## 2024-02-15 LAB — CBC WITH DIFFERENTIAL/PLATELET
Abs Immature Granulocytes: 0.02 10*3/uL (ref 0.00–0.07)
Basophils Absolute: 0 10*3/uL (ref 0.0–0.1)
Basophils Relative: 0 %
Eosinophils Absolute: 0 10*3/uL (ref 0.0–0.5)
Eosinophils Relative: 0 %
HCT: 32.2 % — ABNORMAL LOW (ref 36.0–46.0)
Hemoglobin: 10.7 g/dL — ABNORMAL LOW (ref 12.0–15.0)
Immature Granulocytes: 0 %
Lymphocytes Relative: 24 %
Lymphs Abs: 2 10*3/uL (ref 0.7–4.0)
MCH: 30.8 pg (ref 26.0–34.0)
MCHC: 33.2 g/dL (ref 30.0–36.0)
MCV: 92.8 fL (ref 80.0–100.0)
Monocytes Absolute: 0.5 10*3/uL (ref 0.1–1.0)
Monocytes Relative: 6 %
Neutro Abs: 5.6 10*3/uL (ref 1.7–7.7)
Neutrophils Relative %: 70 %
Platelets: 293 10*3/uL (ref 150–400)
RBC: 3.47 MIL/uL — ABNORMAL LOW (ref 3.87–5.11)
RDW: 12.9 % (ref 11.5–15.5)
WBC: 8.2 10*3/uL (ref 4.0–10.5)
nRBC: 0 % (ref 0.0–0.2)

## 2024-02-15 LAB — AMMONIA: Ammonia: 23 umol/L (ref 9–35)

## 2024-02-15 LAB — ETHANOL: Alcohol, Ethyl (B): <15 mg/dL (ref <15)

## 2024-02-15 LAB — TSH: TSH: 5.012 u[IU]/mL — ABNORMAL HIGH (ref 0.350–4.500)

## 2024-02-15 LAB — T4, FREE: Free T4: 1.53 ng/dL — ABNORMAL HIGH (ref 0.61–1.12)

## 2024-02-15 MED ORDER — POTASSIUM CHLORIDE 10 MEQ/100ML IV SOLN
10.0000 meq | Freq: Once | INTRAVENOUS | Status: AC
  Administered 2024-02-15: 10 meq via INTRAVENOUS
  Filled 2024-02-15: qty 100

## 2024-02-15 MED ORDER — FOLIC ACID 1 MG PO TABS
1.0000 mg | ORAL_TABLET | Freq: Once | ORAL | Status: AC
  Administered 2024-02-15: 1 mg via ORAL
  Filled 2024-02-15: qty 1

## 2024-02-15 MED ORDER — PHENOBARBITAL SODIUM 65 MG/ML IJ SOLN
130.0000 mg | Freq: Once | INTRAMUSCULAR | Status: AC
  Administered 2024-02-15: 130 mg via INTRAVENOUS
  Filled 2024-02-15: qty 2

## 2024-02-15 MED ORDER — LACTATED RINGERS IV BOLUS
1000.0000 mL | Freq: Once | INTRAVENOUS | Status: AC
  Administered 2024-02-15: 1000 mL via INTRAVENOUS

## 2024-02-15 MED ORDER — THIAMINE HCL 100 MG/ML IJ SOLN
100.0000 mg | Freq: Every day | INTRAMUSCULAR | Status: DC
  Administered 2024-02-15: 100 mg via INTRAVENOUS
  Filled 2024-02-15: qty 2

## 2024-02-15 MED ORDER — PHENOBARBITAL SODIUM 65 MG/ML IJ SOLN
65.0000 mg | Freq: Once | INTRAMUSCULAR | Status: AC
  Administered 2024-02-15: 65 mg via INTRAVENOUS
  Filled 2024-02-15: qty 1

## 2024-02-15 MED ORDER — PHENOBARBITAL SODIUM 65 MG/ML IJ SOLN
130.0000 mg | Freq: Once | INTRAMUSCULAR | Status: DC
Start: 2024-02-15 — End: 2024-02-15

## 2024-02-15 NOTE — ED Notes (Signed)
 Pt deciding to stay after all.
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
## 2024-02-15 NOTE — ED Notes (Signed)
 Pt resting, eyes closed. Asked if she could provide a urine sample but says "not right now."

## 2024-02-15 NOTE — ED Notes (Signed)
 Pt resting, eyes closed. Breathing easy/unlabored.

## 2024-02-15 NOTE — Discharge Instructions (Addendum)
Transfer to [facility] for medical clearance. °
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
## 2024-02-15 NOTE — ED Triage Notes (Addendum)
 Called out for confusion. Reported had a fall the other day and brief episode of not remembering what happened. Has been alert and oriented with EMS. Complaining of some weakness and left rib pain. Coming from behavioral health in which she was there for [facility]. Last drink a couple of days ago.     EKG normal.  Orthostatic. Stood and was dizzy.  500 NS 18 LAC

## 2024-02-15 NOTE — ED Notes (Signed)
 Lab to add on t4 to the existing blood in the lab.

## 2024-02-15 NOTE — ED Notes (Signed)
 Pt reports she's not staying in the hospital to be admitted. Provider aware.  Pt to leave AMA. Attempting to find a ride home.

## 2024-02-16 ENCOUNTER — Encounter (HOSPITAL_COMMUNITY): Payer: Self-pay | Admitting: Internal Medicine

## 2024-02-16 DIAGNOSIS — W19XXXA Unspecified fall, initial encounter: Secondary | ICD-10-CM | POA: Diagnosis present

## 2024-02-16 DIAGNOSIS — F10231 Alcohol dependence with withdrawal delirium: Secondary | ICD-10-CM | POA: Diagnosis present

## 2024-02-16 DIAGNOSIS — D649 Anemia, unspecified: Secondary | ICD-10-CM | POA: Insufficient documentation

## 2024-02-16 DIAGNOSIS — Z823 Family history of stroke: Secondary | ICD-10-CM | POA: Diagnosis not present

## 2024-02-16 DIAGNOSIS — Z681 Body mass index (BMI) 19 or less, adult: Secondary | ICD-10-CM | POA: Diagnosis not present

## 2024-02-16 DIAGNOSIS — Z825 Family history of asthma and other chronic lower respiratory diseases: Secondary | ICD-10-CM | POA: Diagnosis not present

## 2024-02-16 DIAGNOSIS — I480 Paroxysmal atrial fibrillation: Secondary | ICD-10-CM | POA: Diagnosis present

## 2024-02-16 DIAGNOSIS — F322 Major depressive disorder, single episode, severe without psychotic features: Secondary | ICD-10-CM | POA: Diagnosis present

## 2024-02-16 DIAGNOSIS — F411 Generalized anxiety disorder: Secondary | ICD-10-CM | POA: Diagnosis present

## 2024-02-16 DIAGNOSIS — S42009A Fracture of unspecified part of unspecified clavicle, initial encounter for closed fracture: Secondary | ICD-10-CM | POA: Diagnosis present

## 2024-02-16 DIAGNOSIS — F10939 Alcohol use, unspecified with withdrawal, unspecified: Secondary | ICD-10-CM | POA: Diagnosis present

## 2024-02-16 DIAGNOSIS — Z833 Family history of diabetes mellitus: Secondary | ICD-10-CM | POA: Diagnosis not present

## 2024-02-16 DIAGNOSIS — R636 Underweight: Secondary | ICD-10-CM | POA: Diagnosis present

## 2024-02-16 DIAGNOSIS — Z791 Long term (current) use of non-steroidal anti-inflammatories (NSAID): Secondary | ICD-10-CM | POA: Diagnosis not present

## 2024-02-16 DIAGNOSIS — Z806 Family history of leukemia: Secondary | ICD-10-CM | POA: Diagnosis not present

## 2024-02-16 DIAGNOSIS — F10931 Alcohol use, unspecified with withdrawal delirium: Secondary | ICD-10-CM | POA: Diagnosis not present

## 2024-02-16 DIAGNOSIS — Z79899 Other long term (current) drug therapy: Secondary | ICD-10-CM | POA: Diagnosis not present

## 2024-02-16 DIAGNOSIS — D638 Anemia in other chronic diseases classified elsewhere: Secondary | ICD-10-CM | POA: Diagnosis present

## 2024-02-16 DIAGNOSIS — I2510 Atherosclerotic heart disease of native coronary artery without angina pectoris: Secondary | ICD-10-CM | POA: Diagnosis present

## 2024-02-16 DIAGNOSIS — K219 Gastro-esophageal reflux disease without esophagitis: Secondary | ICD-10-CM | POA: Diagnosis present

## 2024-02-16 DIAGNOSIS — F10221 Alcohol dependence with intoxication delirium: Secondary | ICD-10-CM | POA: Diagnosis not present

## 2024-02-16 DIAGNOSIS — F17290 Nicotine dependence, other tobacco product, uncomplicated: Secondary | ICD-10-CM | POA: Diagnosis present

## 2024-02-16 DIAGNOSIS — F10930 Alcohol use, unspecified with withdrawal, uncomplicated: Secondary | ICD-10-CM

## 2024-02-16 DIAGNOSIS — Z8249 Family history of ischemic heart disease and other diseases of the circulatory system: Secondary | ICD-10-CM | POA: Diagnosis not present

## 2024-02-16 DIAGNOSIS — S2241XA Multiple fractures of ribs, right side, initial encounter for closed fracture: Secondary | ICD-10-CM | POA: Diagnosis present

## 2024-02-16 DIAGNOSIS — E876 Hypokalemia: Secondary | ICD-10-CM

## 2024-02-16 DIAGNOSIS — R4182 Altered mental status, unspecified: Secondary | ICD-10-CM

## 2024-02-16 DIAGNOSIS — R64 Cachexia: Secondary | ICD-10-CM | POA: Diagnosis present

## 2024-02-16 DIAGNOSIS — R262 Difficulty in walking, not elsewhere classified: Secondary | ICD-10-CM | POA: Diagnosis present

## 2024-02-16 DIAGNOSIS — F17210 Nicotine dependence, cigarettes, uncomplicated: Secondary | ICD-10-CM | POA: Diagnosis present

## 2024-02-16 LAB — HEPATIC FUNCTION PANEL
ALT: 15 U/L (ref 0–44)
AST: 22 U/L (ref 15–41)
Albumin: 3.1 g/dL — ABNORMAL LOW (ref 3.5–5.0)
Alkaline Phosphatase: 67 U/L (ref 38–126)
Bilirubin, Direct: 0.1 mg/dL (ref 0.0–0.2)
Indirect Bilirubin: 0.6 mg/dL (ref 0.3–0.9)
Total Bilirubin: 0.7 mg/dL (ref 0.0–1.2)
Total Protein: 5.4 g/dL — ABNORMAL LOW (ref 6.5–8.1)

## 2024-02-16 LAB — IRON AND TIBC
Iron: 82 ug/dL (ref 28–170)
Saturation Ratios: 26 % (ref 10.4–31.8)
TIBC: 318 ug/dL (ref 250–450)
UIBC: 236 ug/dL

## 2024-02-16 LAB — CBC WITH DIFFERENTIAL/PLATELET
Abs Immature Granulocytes: 0.01 10*3/uL (ref 0.00–0.07)
Basophils Absolute: 0 10*3/uL (ref 0.0–0.1)
Basophils Relative: 1 %
Eosinophils Absolute: 0.2 10*3/uL (ref 0.0–0.5)
Eosinophils Relative: 2 %
HCT: 30.7 % — ABNORMAL LOW (ref 36.0–46.0)
Hemoglobin: 10.4 g/dL — ABNORMAL LOW (ref 12.0–15.0)
Immature Granulocytes: 0 %
Lymphocytes Relative: 30 %
Lymphs Abs: 2.3 10*3/uL (ref 0.7–4.0)
MCH: 30.7 pg (ref 26.0–34.0)
MCHC: 33.9 g/dL (ref 30.0–36.0)
MCV: 90.6 fL (ref 80.0–100.0)
Monocytes Absolute: 0.5 10*3/uL (ref 0.1–1.0)
Monocytes Relative: 6 %
Neutro Abs: 4.7 10*3/uL (ref 1.7–7.7)
Neutrophils Relative %: 61 %
Platelets: 270 10*3/uL (ref 150–400)
RBC: 3.39 MIL/uL — ABNORMAL LOW (ref 3.87–5.11)
RDW: 12.9 % (ref 11.5–15.5)
WBC: 7.7 10*3/uL (ref 4.0–10.5)
nRBC: 0 % (ref 0.0–0.2)

## 2024-02-16 LAB — RETICULOCYTES
Immature Retic Fract: 25.3 % — ABNORMAL HIGH (ref 2.3–15.9)
RBC.: 3.39 MIL/uL — ABNORMAL LOW (ref 3.87–5.11)
Retic Count, Absolute: 61.4 10*3/uL (ref 19.0–186.0)
Retic Ct Pct: 1.8 % (ref 0.4–3.1)

## 2024-02-16 LAB — FERRITIN: Ferritin: 88 ng/mL (ref 11–307)

## 2024-02-16 LAB — MAGNESIUM: Magnesium: 1.2 mg/dL — ABNORMAL LOW (ref 1.7–2.4)

## 2024-02-16 LAB — BASIC METABOLIC PANEL WITH GFR
Anion gap: 7 (ref 5–15)
BUN: 12 mg/dL (ref 6–20)
CO2: 23 mmol/L (ref 22–32)
Calcium: 8.3 mg/dL — ABNORMAL LOW (ref 8.9–10.3)
Chloride: 108 mmol/L (ref 98–111)
Creatinine, Ser: 0.81 mg/dL (ref 0.44–1.00)
GFR, Estimated: >60 mL/min (ref >60)
Glucose, Bld: 97 mg/dL (ref 70–99)
Potassium: 2.9 mmol/L — ABNORMAL LOW (ref 3.5–5.1)
Sodium: 138 mmol/L (ref 135–145)

## 2024-02-16 LAB — TSH: TSH: 2.217 u[IU]/mL (ref 0.350–4.500)

## 2024-02-16 LAB — HIV ANTIBODY (ROUTINE TESTING W REFLEX): HIV Screen 4th Generation wRfx: NONREACTIVE

## 2024-02-16 LAB — VITAMIN B12: Vitamin B-12: 342 pg/mL (ref 180–914)

## 2024-02-16 LAB — FOLATE: Folate: 10.1 ng/mL (ref >5.9)

## 2024-02-16 MED ORDER — POTASSIUM CHLORIDE 20 MEQ PO PACK: 20.0000 meq | PACK | Freq: Two times a day (BID) | ORAL | Status: DC

## 2024-02-16 MED ORDER — CHLORDIAZEPOXIDE HCL 25 MG PO CAPS
25.0000 mg | ORAL_CAPSULE | Freq: Three times a day (TID) | ORAL | Status: AC
  Administered 2024-02-17 (×2): 25 mg via ORAL
  Filled 2024-02-16 (×2): qty 1

## 2024-02-16 MED ORDER — LORAZEPAM 1 MG PO TABS
1.0000 mg | ORAL_TABLET | ORAL | Status: AC | PRN
  Administered 2024-02-16: 2 mg via ORAL
  Administered 2024-02-17 – 2024-02-18 (×2): 1 mg via ORAL
  Filled 2024-02-16: qty 1
  Filled 2024-02-16: qty 2
  Filled 2024-02-16: qty 1
  Filled 2024-02-16 (×2): qty 2
  Filled 2024-02-16: qty 1

## 2024-02-16 MED ORDER — LORAZEPAM 1 MG PO TABS
1.0000 mg | ORAL_TABLET | ORAL | Status: AC | PRN
  Administered 2024-02-17 – 2024-02-18 (×2): 1 mg via ORAL

## 2024-02-16 MED ORDER — CHLORDIAZEPOXIDE HCL 25 MG PO CAPS: 25.0000 mg | ORAL_CAPSULE | Freq: Four times a day (QID) | ORAL | Status: AC | PRN

## 2024-02-16 MED ORDER — ADULT MULTIVITAMIN W/MINERALS CH
1.0000 | ORAL_TABLET | Freq: Every day | ORAL | Status: DC
  Administered 2024-02-16 – 2024-02-23 (×8): 1 via ORAL
  Filled 2024-02-16 (×8): qty 1

## 2024-02-16 MED ORDER — FOLIC ACID 1 MG PO TABS
1.0000 mg | ORAL_TABLET | Freq: Every day | ORAL | Status: DC
  Administered 2024-02-16 – 2024-02-23 (×8): 1 mg via ORAL
  Filled 2024-02-16 (×8): qty 1

## 2024-02-16 MED ORDER — THIAMINE HCL 100 MG/ML IJ SOLN
100.0000 mg | Freq: Every day | INTRAMUSCULAR | Status: DC
  Filled 2024-02-16: qty 2

## 2024-02-16 MED ORDER — CHLORDIAZEPOXIDE HCL 25 MG PO CAPS
25.0000 mg | ORAL_CAPSULE | ORAL | Status: AC
  Administered 2024-02-18 (×2): 25 mg via ORAL
  Filled 2024-02-16 (×2): qty 1

## 2024-02-16 MED ORDER — THIAMINE HCL 100 MG/ML IJ SOLN
100.0000 mg | Freq: Once | INTRAMUSCULAR | Status: DC
Start: 2024-02-16 — End: 2024-02-16

## 2024-02-16 MED ORDER — FLUOXETINE HCL 20 MG PO CAPS
40.0000 mg | ORAL_CAPSULE | Freq: Every day | ORAL | Status: DC
  Administered 2024-02-16 – 2024-02-23 (×8): 40 mg via ORAL
  Filled 2024-02-16 (×8): qty 2

## 2024-02-16 MED ORDER — GABAPENTIN 300 MG PO CAPS
300.0000 mg | ORAL_CAPSULE | Freq: Three times a day (TID) | ORAL | Status: DC
  Administered 2024-02-16 – 2024-02-23 (×21): 300 mg via ORAL
  Filled 2024-02-16 (×20): qty 1
  Filled 2024-02-16: qty 3

## 2024-02-16 MED ORDER — QUETIAPINE FUMARATE 25 MG PO TABS
50.0000 mg | ORAL_TABLET | Freq: Every day | ORAL | Status: DC
  Administered 2024-02-16 – 2024-02-19 (×4): 50 mg via ORAL
  Filled 2024-02-16 (×4): qty 2

## 2024-02-16 MED ORDER — NICOTINE 21 MG/24HR TD PT24
21.0000 mg | MEDICATED_PATCH | Freq: Every day | TRANSDERMAL | Status: DC
  Administered 2024-02-16 – 2024-02-23 (×8): 21 mg via TRANSDERMAL
  Filled 2024-02-16 (×8): qty 1

## 2024-02-16 MED ORDER — ADULT MULTIVITAMIN W/MINERALS CH: 1.0000 | ORAL_TABLET | Freq: Every day | ORAL | Status: DC

## 2024-02-16 MED ORDER — QUETIAPINE FUMARATE 100 MG PO TABS
400.0000 mg | ORAL_TABLET | Freq: Every day | ORAL | Status: DC
  Administered 2024-02-16: 400 mg via ORAL
  Filled 2024-02-16 (×2): qty 4

## 2024-02-16 MED ORDER — MAGNESIUM OXIDE -MG SUPPLEMENT 400 (240 MG) MG PO TABS
800.0000 mg | ORAL_TABLET | Freq: Two times a day (BID) | ORAL | Status: DC
  Administered 2024-02-16 – 2024-02-23 (×13): 800 mg via ORAL
  Filled 2024-02-16 (×13): qty 2

## 2024-02-16 MED ORDER — ONDANSETRON 4 MG PO TBDP: 4.0000 mg | ORAL_TABLET | Freq: Four times a day (QID) | ORAL | Status: AC | PRN

## 2024-02-16 MED ORDER — PANTOPRAZOLE SODIUM 40 MG PO TBEC
40.0000 mg | DELAYED_RELEASE_TABLET | Freq: Every day | ORAL | Status: DC
  Administered 2024-02-16 – 2024-02-23 (×8): 40 mg via ORAL
  Filled 2024-02-16 (×8): qty 1

## 2024-02-16 MED ORDER — CHLORDIAZEPOXIDE HCL 25 MG PO CAPS
25.0000 mg | ORAL_CAPSULE | Freq: Every day | ORAL | Status: AC
  Administered 2024-02-19: 25 mg via ORAL
  Filled 2024-02-16: qty 1

## 2024-02-16 MED ORDER — TRAZODONE HCL 50 MG PO TABS
200.0000 mg | ORAL_TABLET | Freq: Every day | ORAL | Status: DC
  Administered 2024-02-16 – 2024-02-22 (×6): 200 mg via ORAL
  Filled 2024-02-16 (×6): qty 4

## 2024-02-16 MED ORDER — HYDROXYZINE HCL 25 MG PO TABS: 25.0000 mg | ORAL_TABLET | Freq: Four times a day (QID) | ORAL | Status: AC | PRN

## 2024-02-16 MED ORDER — THIAMINE MONONITRATE 100 MG PO TABS
100.0000 mg | ORAL_TABLET | Freq: Every day | ORAL | Status: DC
  Administered 2024-02-16 – 2024-02-23 (×8): 100 mg via ORAL
  Filled 2024-02-16 (×8): qty 1

## 2024-02-16 MED ORDER — CHLORDIAZEPOXIDE HCL 25 MG PO CAPS
25.0000 mg | ORAL_CAPSULE | Freq: Four times a day (QID) | ORAL | Status: AC
  Administered 2024-02-16 (×3): 25 mg via ORAL
  Filled 2024-02-16 (×4): qty 1

## 2024-02-16 MED ORDER — ENOXAPARIN SODIUM 30 MG/0.3ML IJ SOSY
30.0000 mg | PREFILLED_SYRINGE | INTRAMUSCULAR | Status: DC
  Administered 2024-02-16 – 2024-02-23 (×6): 30 mg via SUBCUTANEOUS
  Filled 2024-02-16 (×8): qty 0.3

## 2024-02-16 MED ORDER — LOPERAMIDE HCL 2 MG PO CAPS: 2.0000 mg | ORAL_CAPSULE | ORAL | Status: AC | PRN

## 2024-02-16 MED ORDER — POTASSIUM CHLORIDE 20 MEQ PO PACK
40.0000 meq | PACK | Freq: Two times a day (BID) | ORAL | Status: AC
  Administered 2024-02-16 – 2024-02-17 (×3): 40 meq via ORAL
  Filled 2024-02-16 (×3): qty 2

## 2024-02-16 MED ORDER — MAGNESIUM SULFATE 50 % IJ SOLN
6.0000 g | Freq: Once | INTRAVENOUS | Status: AC
  Administered 2024-02-16: 6 g via INTRAVENOUS
  Filled 2024-02-16: qty 2

## 2024-02-16 NOTE — Progress Notes (Signed)
 Have seen and assessed patient and agree with Dr. Kent Pear assessment and plan. Patient 63 year old female history of alcohol abuse, depression, anxiety referred to the ED from behavioral health center after patient was being found to be confused.  Patient was at behavioral health center voluntary for alcohol detox and sent to the ED for further evaluation.  CT head done with no acute abnormalities. Physical exam General: Some tremors noted in upper extremities. Respiratory: Lungs clear to auscultation bilaterally.  No wheezes, no crackles, no rhonchi.  Fair air movement.  Speaking in full sentences. Cardiovascular: Regular rate rhythm no murmurs rubs or gallops.  No JVD.  No lower extremity edema. Extremities: No clubbing cyanosis or edema.  Assessment/plan #1  Confusion/altered mental status -Secondary to alcohol withdrawal. -Patient alert and oriented x 3. -Patient with no signs or symptoms of infection. -CT head negative for any acute abnormalities. -Ammonia level within normal limits. -TSH within normal limits at 2.217. -Folate level at 10.1. -Vitamin B12 at 342. -Patient improved clinically likely close to baseline.  2.  Alcohol withdrawal -Patient currently with some tremors noted in the upper extremities. -Continue the Ativan  withdrawal protocol. -Start Librium  detox protocol. -Continue thiamine , multivitamin, folic acid . -Patient requesting to go back to [facility] to complete her detox.  3.  Hypokalemia/hypomagnesemia -Likely secondary to chronic alcohol abuse. -Potassium noted at 2.9 this morning, magnesium  at 1.2. -Magnesium  sulfate 6 g IV x 1. -Increase oral potassium supplementation to 40 mEq twice daily for 2 days. -Repeat labs in the AM.  4.  Anemia -Patient with no overt bleeding. -Anemia panel consistent with anemia of chronic disease. -Hemoglobin currently stable at 10.4. -Outpatient follow-up.  5.  Depression/anxiety -Continue home regimen of Seroquel ,  Prozac , trazodone , gabapentin .  No charge.
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
## 2024-02-16 NOTE — ED Notes (Signed)
 Charge on 2W aware that patient is on the way up.

## 2024-02-16 NOTE — Plan of Care (Signed)

## 2024-02-16 NOTE — Plan of Care (Signed)
  Problem: Education: Goal: Knowledge of General Education information will improve Description: Including pain rating scale, medication(s)/side effects and non-pharmacologic comfort measures Outcome: Progressing   Problem: Health Behavior/Discharge Planning: Goal: Ability to manage health-related needs will improve Outcome: Progressing   Problem: Clinical Measurements: Goal: Ability to maintain clinical measurements within normal limits will improve Outcome: Progressing Goal: Will remain free from infection Outcome: Progressing   Problem: Activity: Goal: Risk for activity intolerance will decrease Outcome: Progressing   Problem: Coping: Goal: Level of anxiety will decrease Outcome: Progressing   Problem: Elimination: Goal: Will not experience complications related to bowel motility Outcome: Progressing Goal: Will not experience complications related to urinary retention Outcome: Progressing   Problem: Pain Managment: Goal: General experience of comfort will improve and/or be controlled Outcome: Progressing   Problem: Safety: Goal: Ability to remain free from injury will improve Outcome: Progressing   Problem: Skin Integrity: Goal: Risk for impaired skin integrity will decrease Outcome: Progressing

## 2024-02-17 ENCOUNTER — Inpatient Hospital Stay (HOSPITAL_COMMUNITY)

## 2024-02-17 DIAGNOSIS — F10930 Alcohol use, unspecified with withdrawal, uncomplicated: Secondary | ICD-10-CM | POA: Diagnosis not present

## 2024-02-17 DIAGNOSIS — D649 Anemia, unspecified: Secondary | ICD-10-CM | POA: Diagnosis not present

## 2024-02-17 DIAGNOSIS — F411 Generalized anxiety disorder: Secondary | ICD-10-CM | POA: Diagnosis not present

## 2024-02-17 LAB — RAPID URINE DRUG SCREEN, HOSP PERFORMED
Amphetamines: NOT DETECTED
Barbiturates: POSITIVE — AB
Benzodiazepines: POSITIVE — AB
Cocaine: NOT DETECTED
Opiates: NOT DETECTED
Tetrahydrocannabinol: POSITIVE — AB

## 2024-02-17 LAB — CBC
HCT: 31.8 % — ABNORMAL LOW (ref 36.0–46.0)
Hemoglobin: 10.7 g/dL — ABNORMAL LOW (ref 12.0–15.0)
MCH: 30.5 pg (ref 26.0–34.0)
MCHC: 33.6 g/dL (ref 30.0–36.0)
MCV: 90.6 fL (ref 80.0–100.0)
Platelets: 301 10*3/uL (ref 150–400)
RBC: 3.51 MIL/uL — ABNORMAL LOW (ref 3.87–5.11)
RDW: 12.9 % (ref 11.5–15.5)
WBC: 7.1 10*3/uL (ref 4.0–10.5)
nRBC: 0 % (ref 0.0–0.2)

## 2024-02-17 LAB — BASIC METABOLIC PANEL WITH GFR
Anion gap: 10 (ref 5–15)
BUN: 8 mg/dL (ref 6–20)
CO2: 23 mmol/L (ref 22–32)
Calcium: 8.3 mg/dL — ABNORMAL LOW (ref 8.9–10.3)
Chloride: 106 mmol/L (ref 98–111)
Creatinine, Ser: 0.79 mg/dL (ref 0.44–1.00)
GFR, Estimated: >60 mL/min (ref >60)
Glucose, Bld: 89 mg/dL (ref 70–99)
Potassium: 3.7 mmol/L (ref 3.5–5.1)
Sodium: 139 mmol/L (ref 135–145)

## 2024-02-17 LAB — MAGNESIUM: Magnesium: 1.8 mg/dL (ref 1.7–2.4)

## 2024-02-17 MED ORDER — ACETAMINOPHEN 325 MG PO TABS
650.0000 mg | ORAL_TABLET | ORAL | Status: DC | PRN
  Administered 2024-02-17 – 2024-02-21 (×2): 650 mg via ORAL
  Filled 2024-02-17 (×2): qty 2

## 2024-02-17 MED ORDER — KETOROLAC TROMETHAMINE 15 MG/ML IJ SOLN
15.0000 mg | Freq: Three times a day (TID) | INTRAMUSCULAR | Status: AC | PRN
  Filled 2024-02-17: qty 1

## 2024-02-17 NOTE — Progress Notes (Signed)
 [facility]                                                                               Carla Martinez, is a 63 y.o. female, DOB - Jul 29, 1961, ZOX:[id number] Admit date - 02/15/2024    Outpatient Primary MD for the patient is Tivis Forster, Hoy Mackintosh, NP  LOS - 1  days    Brief summary   63 year old female history of alcohol abuse, depression, anxiety referred to the ED from behavioral health center after patient was being found to be confused. Patient was at behavioral health center voluntary for alcohol detox and sent to the ED for further evaluation. CT head done with no acute abnormalities.   Assessment & Plan    Assessment and Plan:   Acute encephalopathy probably secondary to alcohol intoxication and withdrawals Currently patient is alert and oriented x 3 CT head is negative for acute abnormalities Ammonia and TSH levels are within normal limits patient appears to be at baseline at this time  Hypokalemia and hypomagnesemia Replaced   Anemia of chronic disease Hemoglobin stable around 10.4  Left sided rib pain X rays of the ribs ordered.  Pain control.      Depression and anxiety continue with home medications .   Plan for transfer to [facility] when medically stable.    Estimated body mass index is 17.03 kg/m as calculated from the following:   Height as of this encounter: 5\' 4"  (1.626 m).   Weight as of this encounter: 45 kg.  Code Status: full code.  DVT Prophylaxis:  enoxaparin  (LOVENOX ) injection 30 mg Start: 02/16/24 1000   Level of Care: Level of care: Telemetry Medical Family Communication: NONE AT BEDSIDE.  Disposition Plan:     Remains inpatient appropriate:  pending transfer to [facility]   Procedures:  None.   Consultants:   Psychiatry.   Antimicrobials:   Anti-infectives (From admission, onward)    None        Medications  Scheduled Meds:  chlordiazePOXIDE   25 mg Oral TID   Followed by   Cecily Cohen ON 02/18/2024] chlordiazePOXIDE    25 mg Oral BH-qamhs   Followed by   Cecily Cohen ON 02/19/2024] chlordiazePOXIDE   25 mg Oral Daily   enoxaparin  (LOVENOX ) injection  30 mg Subcutaneous Q24H   FLUoxetine   40 mg Oral Daily   folic acid   1 mg Oral Daily   gabapentin   300 mg Oral TID   magnesium  oxide  800 mg Oral BID   multivitamin with minerals  1 tablet Oral Daily   nicotine   21 mg Transdermal Daily   pantoprazole   40 mg Oral Daily   potassium chloride   40 mEq Oral BID   QUEtiapine   400 mg Oral QHS   QUEtiapine   50 mg Oral Daily   thiamine   100 mg Oral Daily   Or   thiamine   100 mg Intravenous Daily   traZODone   200 mg Oral QHS   Continuous Infusions: PRN Meds:.acetaminophen , chlordiazePOXIDE , hydrOXYzine , loperamide , LORazepam  **OR** LORazepam , ondansetron     Subjective:   Carla Martinez was seen and examined today.  No agitation. No headache,nausea or vomiting.   Objective:   Vitals:   02/16/24 2054  02/17/24 0418 02/17/24 0822 02/17/24 1200  BP: 110/73 (!) 93/54 102/63 107/74  Pulse: 66 70 70 78  Resp: 17 16 16 16   Temp: 98.2 F (36.8 C) 97.6 F (36.4 C) 98.4 F (36.9 C) 98.4 F (36.9 C)  TempSrc: Oral Oral Oral   SpO2: 100% 95% 95% 97%  Weight:      Height:       No intake or output data in the 24 hours ending 02/17/24 1331 Filed Weights   02/15/24 1840  Weight: 45 kg     Exam General exam: Appears calm and comfortable  Respiratory system: Clear to auscultation. Respiratory effort normal. Cardiovascular system: S1 & S2 heard, RRR.  Gastrointestinal system: Abdomen is nondistended, soft and nontender.  Central nervous system: Alert and oriented to person and place and able to move all extremities. Extremities: Symmetric 5 x 5 power. Skin: No rashes,  Psychiatry: flat affect.    Data Reviewed:  I have personally reviewed following labs and imaging studies   CBC Lab Results  Component Value Date   WBC 7.1 02/17/2024   RBC 3.51 (L) 02/17/2024   HGB 10.7 (L) 02/17/2024   HCT 31.8 (L)  02/17/2024   MCV 90.6 02/17/2024   MCH 30.5 02/17/2024   PLT 301 02/17/2024   MCHC 33.6 02/17/2024   RDW 12.9 02/17/2024   LYMPHSABS 2.3 02/16/2024   MONOABS 0.5 02/16/2024   EOSABS 0.2 02/16/2024   BASOSABS 0.0 02/16/2024     Last metabolic panel Lab Results  Component Value Date   NA 139 02/17/2024   K 3.7 02/17/2024   CL 106 02/17/2024   CO2 23 02/17/2024   BUN 8 02/17/2024   CREATININE 0.79 02/17/2024   GLUCOSE 89 02/17/2024   GFRNONAA >60 02/17/2024   CALCIUM  8.3 (L) 02/17/2024   PHOS 2.0 (L) 03/06/2022   PROT 5.4 (L) 02/16/2024   ALBUMIN  3.1 (L) 02/16/2024   BILITOT 0.7 02/16/2024   ALKPHOS 67 02/16/2024   AST 22 02/16/2024   ALT 15 02/16/2024   ANIONGAP 10 02/17/2024    CBG (last 3)  No results for input(s): "GLUCAP" in the last 72 hours.    Coagulation Profile: No results for input(s): "INR", "PROTIME" in the last 168 hours.   Radiology Studies: CT HEAD WO CONTRAST Result Date: 02/15/2024 EXAM: CT HEAD WITHOUT 02/15/2024 07:55:13 PM TECHNIQUE: CT of the head was performed without the administration of intravenous contrast. Automated exposure control, iterative reconstruction, and/or weight based adjustment of the mA/kV was utilized to reduce the radiation dose to as low as reasonably achievable. COMPARISON: 12/17/2023 CLINICAL HISTORY: Altered mental status, nontraumatic (Ped 0-17y). FINDINGS: BRAIN AND VENTRICLES: There is no acute intracranial hemorrhage, mass effect or midline shift. No abnormal extra-axial fluid collection. The gray-white differentiation is maintained without evidence of an acute infarct. There is no evidence of hydrocephalus. ORBITS: The visualized portion of the orbits demonstrate no acute abnormality. SINUSES: The visualized paranasal sinuses and mastoid air cells demonstrate no acute abnormality. SOFT TISSUES AND SKULL: No acute abnormality of the visualized skull or soft tissues. IMPRESSION: 1. No acute intracranial abnormality.  Electronically signed by: Zadie Herter MD 02/15/2024 08:07 PM EDT RP Workstation: [id number]   DG Chest Port 1 View Result Date: 02/15/2024 EXAM: 1 VIEW(S) XRAY OF THE CHEST 02/15/2024 07:41:00 PM COMPARISON: 12/14/2021 CLINICAL HISTORY: [id number] Pain [id number]. Reported had a fall the other day and brief episode of not remembering what happened. Has been alert and oriented with EMS. Complaining of some weakness and  left rib pain. Coming from behavioral health in which she was there for [facility]. Last drink a couple of days ago. EKG normal. Orthostatic. Stood and was dizzy. 500 NS 18 LAC. FINDINGS: LUNGS AND PLEURA: Mild left basilar scarring/atelectasis. No consolidation. No pulmonary edema. No pleural effusion. No pneumothorax. HEART AND MEDIASTINUM: No acute abnormality of the cardiac and mediastinal silhouettes. BONES AND SOFT TISSUES: No acute osseous abnormality. IMPRESSION: 1. No acute findings. 2. Mild left basilar scarring/atelectasis. Electronically signed by: Zadie Herter MD 02/15/2024 08:05 PM EDT RP Workstation: [id number]       Feliciana Horn M.D. [facility] 02/17/2024, 1:31 PM  Available via Epic secure chat 7am-7pm After 7 pm, please refer to night coverage provider listed on amion.

## 2024-02-17 NOTE — Plan of Care (Signed)
  Problem: Health Behavior/Discharge Planning: Goal: Ability to manage health-related needs will improve Outcome: Progressing   Problem: Clinical Measurements: Goal: Ability to maintain clinical measurements within normal limits will improve Outcome: Progressing Goal: Will remain free from infection Outcome: Progressing Goal: Cardiovascular complication will be avoided Outcome: Progressing   Problem: Elimination: Goal: Will not experience complications related to bowel motility Outcome: Progressing Goal: Will not experience complications related to urinary retention Outcome: Progressing   Problem: Pain Managment: Goal: General experience of comfort will improve and/or be controlled Outcome: Progressing   Problem: Safety: Goal: Ability to remain free from injury will improve Outcome: Progressing   Problem: Skin Integrity: Goal: Risk for impaired skin integrity will decrease Outcome: Progressing

## 2024-02-17 NOTE — Progress Notes (Signed)
 PT returned from x-ray @1450 

## 2024-02-17 NOTE — Progress Notes (Signed)
 Orthopedic Tech Progress Note Patient Details:  Carla Martinez 11-Sep-1961 [id number]  Ortho Devices Type of Ortho Device: Arm sling Ortho Device/Splint Location: LUE Ortho Device/Splint Interventions: Ordered, Removal, Application   Post Interventions Patient Tolerated: Well Instructions Provided: Care of device  Kermitt Pedlar 02/17/2024, 5:52 PM

## 2024-02-17 NOTE — Progress Notes (Signed)
 Pt taken for X-ray @1430 

## 2024-02-18 DIAGNOSIS — F411 Generalized anxiety disorder: Secondary | ICD-10-CM | POA: Diagnosis not present

## 2024-02-18 DIAGNOSIS — F10930 Alcohol use, unspecified with withdrawal, uncomplicated: Secondary | ICD-10-CM | POA: Diagnosis not present

## 2024-02-18 DIAGNOSIS — D649 Anemia, unspecified: Secondary | ICD-10-CM | POA: Diagnosis not present

## 2024-02-18 MED ORDER — VITAMIN B-12 1000 MCG PO TABS
500.0000 ug | ORAL_TABLET | Freq: Every day | ORAL | Status: DC
Start: 2024-02-18 — End: 2024-02-23
  Administered 2024-02-18 – 2024-02-23 (×6): 500 ug via ORAL
  Filled 2024-02-18 (×6): qty 1

## 2024-02-18 NOTE — Progress Notes (Signed)
 [facility]                                                                               Carla Martinez, is a 63 y.o. female, DOB - 07/24/1961, ZOX:[id number] Admit date - 02/15/2024    Outpatient Primary MD for the patient is Tivis Forster, Hoy Mackintosh, NP  LOS - 2  days    Brief summary   63 year old female history of alcohol abuse, depression, anxiety referred to the ED from behavioral health center after patient was being found to be confused. Patient was at behavioral health center voluntary for alcohol detox and sent to the ED for further evaluation. CT head done with no acute abnormalities.   Assessment & Plan    Assessment and Plan:   Acute encephalopathy probably secondary to alcohol intoxication and withdrawals Currently patient is alert and oriented x 3 CT head is negative for acute abnormalities Ammonia and TSH levels are within normal limits patient appears to be at baseline at this time  Hypokalemia and hypomagnesemia Replaced   Anemia of chronic disease Hemoglobin stable around 10.4  Left sided rib pain X rays of the ribs ordered showed Minimally displaced fractures of the right posterior 8th-10th ribs. Pain control.   Clavicular fracture on the left  X rays showed Healing subacute oblique fracture of the distal lateral clavicle shaft. No significant displacement. There is persistent fracture line lucency and incomplete healing at this time. Orthopedics Dr Adrain Alar consulted, recommended sling.  Outpatient follow up with orthopedics in one week.    Depression and anxiety continue with home medications .      Estimated body mass index is 17.03 kg/m as calculated from the following:   Height as of this encounter: 5\' 4"  (1.626 m).   Weight as of this encounter: 45 kg.  Code Status: full code.  DVT Prophylaxis:  enoxaparin  (LOVENOX ) injection 30 mg Start: 02/16/24 1000   Level of Care: Level of care: Telemetry Medical Family Communication: NONE  AT BEDSIDE.  Disposition Plan:     Remains inpatient appropriate:  pending transfer to [facility].   Procedures:  None.   Consultants:   Psychiatry.   Antimicrobials:   Anti-infectives (From admission, onward)    None        Medications  Scheduled Meds:  chlordiazePOXIDE   25 mg Oral BH-qamhs   Followed by   Cecily Cohen ON 02/19/2024] chlordiazePOXIDE   25 mg Oral Daily   enoxaparin  (LOVENOX ) injection  30 mg Subcutaneous Q24H   FLUoxetine   40 mg Oral Daily   folic acid   1 mg Oral Daily   gabapentin   300 mg Oral TID   magnesium  oxide  800 mg Oral BID   multivitamin with minerals  1 tablet Oral Daily   nicotine   21 mg Transdermal Daily   pantoprazole   40 mg Oral Daily   QUEtiapine   400 mg Oral QHS   QUEtiapine   50 mg Oral Daily   thiamine   100 mg Oral Daily   traZODone   200 mg Oral QHS   Continuous Infusions: PRN Meds:.acetaminophen , chlordiazePOXIDE , hydrOXYzine , ketorolac , loperamide , LORazepam  **OR** LORazepam , ondansetron     Subjective:   Carla Martinez was seen and examined today.  Pain is better controlled today. .  Objective:   Vitals:   02/17/24 1900 02/18/24 0421 02/18/24 0938 02/18/24 1258  BP: 101/62 109/77 115/70 102/70  Pulse: 64 87 70 72  Resp:  16 17 18   Temp:  97.6 F (36.4 C) 97.6 F (36.4 C) 98.4 F (36.9 C)  TempSrc: Oral Oral Oral Oral  SpO2: 97% 96% 97% 97%  Weight:      Height:        Intake/Output Summary (Last 24 hours) at 02/18/2024 1423 Last data filed at 02/18/2024 1407 Gross per 24 hour  Intake 240 ml  Output --  Net 240 ml   Filed Weights   02/15/24 1840  Weight: 45 kg     Exam General exam: Appears calm and comfortable  Respiratory system: Clear to auscultation. Respiratory effort normal. Cardiovascular system: S1 & S2 heard, RRR.  Gastrointestinal system: Abdomen is soft bs+ Central nervous system: Alert and oriented.  Extremities: Symmetric 5 x 5 power. Skin: No rashes,  Psychiatry: mood is appropriate.     Data  Reviewed:  I have personally reviewed following labs and imaging studies   CBC Lab Results  Component Value Date   WBC 7.1 02/17/2024   RBC 3.51 (L) 02/17/2024   HGB 10.7 (L) 02/17/2024   HCT 31.8 (L) 02/17/2024   MCV 90.6 02/17/2024   MCH 30.5 02/17/2024   PLT 301 02/17/2024   MCHC 33.6 02/17/2024   RDW 12.9 02/17/2024   LYMPHSABS 2.3 02/16/2024   MONOABS 0.5 02/16/2024   EOSABS 0.2 02/16/2024   BASOSABS 0.0 02/16/2024     Last metabolic panel Lab Results  Component Value Date   NA 139 02/17/2024   K 3.7 02/17/2024   CL 106 02/17/2024   CO2 23 02/17/2024   BUN 8 02/17/2024   CREATININE 0.79 02/17/2024   GLUCOSE 89 02/17/2024   GFRNONAA >60 02/17/2024   CALCIUM  8.3 (L) 02/17/2024   PHOS 2.0 (L) 03/06/2022   PROT 5.4 (L) 02/16/2024   ALBUMIN  3.1 (L) 02/16/2024   BILITOT 0.7 02/16/2024   ALKPHOS 67 02/16/2024   AST 22 02/16/2024   ALT 15 02/16/2024   ANIONGAP 10 02/17/2024    CBG (last 3)  No results for input(s): "GLUCAP" in the last 72 hours.    Coagulation Profile: No results for input(s): "INR", "PROTIME" in the last 168 hours.   Radiology Studies: DG Clavicle Left Result Date: 02/17/2024 CLINICAL DATA:  Closed left clavicle shaft fracture. EXAM: LEFT CLAVICLE - 2+ VIEWS COMPARISON:  Left shoulder radiographs 06/25/2023 FINDINGS: There is diffuse decreased bone mineralization. There is moderate healing sclerosis across the previously seen oblique fracture of the distal lateral clavicle shaft. There is also sclerosis within the marrow adjacent to the fracture. Mild persistent fracture line lucency. Up to 2 mm superior and 2 mm inferior cortical step-off of the lateral fracture component respect of the medial fracture component. Normal alignment of the acromioclavicular joint. The glenohumeral joint is normally aligned. No dislocation. IMPRESSION: Healing subacute oblique fracture of the distal lateral clavicle shaft. No significant displacement. There is  persistent fracture line lucency and incomplete healing at this time. Recommend clinical correlation for possible repeat fracture in the same location as the acute fracture seen on prior remote 06/25/2023 radiographs nearly 8 months ago. Electronically Signed   By: Bertina Broccoli M.D.   On: 02/17/2024 20:18   DG Ribs Bilateral Result Date: 02/17/2024 CLINICAL DATA:  Fall and bilateral rib pain. EXAM: BILATERAL RIBS - 3+ VIEW  COMPARISON:  Chest radiograph dated 02/15/2024. FINDINGS: Mildly displaced fracture of the lateral left clavicle. Minimally displaced fracture of the right posterior 8th-10th ribs. The bones are osteopenic. Bibasilar atelectasis. No pneumothorax. Stable cardiac silhouette. IMPRESSION: 1. Minimally displaced fractures of the right posterior 8th-10th ribs. No pneumothorax. 2. Mildly displaced fracture of the lateral left clavicle. Electronically Signed   By: Angus Bark M.D.   On: 02/17/2024 15:28       Feliciana Horn M.D. [facility] 02/18/2024, 2:23 PM  Available via Epic secure chat 7am-7pm After 7 pm, please refer to night coverage provider listed on amion.

## 2024-02-18 NOTE — Evaluation (Signed)
 Physical Therapy Evaluation Patient Details Name: Carla Martinez MRN: [id number] DOB: [birth year] Today's Date: 02/18/2024  History of Present Illness  63 yo female admitted 4/28 from behavioral health (where she is seeking tx for ETOH withdrawal) with AMS and fall. Head CT (-). Lt clavicle fx and Rt 8-10 rib fx. PMhx: ETOH abuse, polysubstance use, CHF, depression, Afib, Rt femur ORIF  Clinical Impression  Pt with flat affect, unaware of clavicle fx or presence of sling with pt educated for sling wear and NWB LUE to allow healing. Pt with unsteady gait and balance needing physical assist to prevent falls in standing and will benefit from cane trial next session. 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 She is also unsure if she is attempting to return to ETOH rehab. Pt with decreased strength and balance who will benefit from acute therapy to maximize mobility and safety.         If plan is discharge home, recommend the following: A little help with walking and/or transfers;Supervision due to cognitive status   Can travel by private vehicle        Equipment Recommendations None recommended by PT  Recommendations for Other Services  OT consult    Functional Status Assessment Patient has had a recent decline in their functional status and demonstrates the ability to make significant improvements in function in a reasonable and predictable amount of time.     Precautions / Restrictions Precautions Precautions: Fall Recall of Precautions/Restrictions: Impaired Precaution/Restrictions Comments: LUE clavicle fx Required Braces or Orthoses: Sling      Mobility  Bed Mobility Overal bed mobility: Modified Independent                  Transfers Overall transfer level: Needs assistance   Transfers: Sit to/from Stand Sit to Stand: Contact guard assist           General transfer comment: CGA for safety with decreased balance  in standing    Ambulation/Gait Ambulation/Gait assistance: Contact guard assist Gait Distance (Feet): 225 Feet Assistive device: None Gait Pattern/deviations: Step-through pattern, Decreased stride length   Gait velocity interpretation: 1.31 - 2.62 ft/sec, indicative of limited community ambulator   General Gait Details: pt with varied gait with LOB right and left, narrow BOS and needing guarding and tactile assist to maintain balance with stepping  Stairs Stairs: Yes Stairs assistance: Contact guard assist Stair Management: Alternating pattern, Forwards, One rail Right Number of Stairs: 3    Wheelchair Mobility     Tilt Bed    Modified Rankin (Stroke Patients Only)       Balance Overall balance assessment: Needs assistance Sitting-balance support: No upper extremity supported, Feet supported Sitting balance-Leahy Scale: Fair       Standing balance-Leahy Scale: Fair Standing balance comment: can static stand but frequent LOB with gait needing assist to recover                             Pertinent Vitals/Pain Pain Assessment Pain Assessment: 0-10 Pain Score: 4  Pain Location: bil shoulders Pain Descriptors / Indicators: Aching Pain Intervention(s): Limited activity within patient's tolerance, Monitored during session    Home Living Family/patient expects to be discharged to:: Private residence Living Arrangements: Children;Parent Available Help at Discharge: Family;Available PRN/intermittently Type of Home: Apartment Home Access: Stairs to enter Entrance Stairs-Rails: Right Entrance Stairs-Number of Steps: 3   Home Layout: One level Home  Equipment: Agricultural consultant (2 wheels);Cane - single point;Shower seat      Prior Function Prior Level of Function : Independent/Modified Independent;Driving             Mobility Comments: reports she lives with disabled son and mom and she cares for them, normally independent       Extremity/Trunk  Assessment   Upper Extremity Assessment Upper Extremity Assessment: Generalized weakness    Lower Extremity Assessment Lower Extremity Assessment: Generalized weakness    Cervical / Trunk Assessment Cervical / Trunk Assessment: Normal  Communication   Communication Communication: No apparent difficulties    Cognition Arousal: Alert Behavior During Therapy: Flat affect   PT - Cognitive impairments: Problem solving, Safety/Judgement                         Following commands: Intact       Cueing Cueing Techniques: Verbal cues     General Comments      Exercises     Assessment/Plan    PT Assessment Patient needs continued PT services  PT Problem List Decreased strength;Decreased coordination;Decreased cognition;Decreased activity tolerance;Decreased balance;Decreased mobility;Pain       PT Treatment Interventions DME instruction;Gait training;Functional mobility training;Patient/family education;Therapeutic activities;Balance training;Neuromuscular re-education    PT Goals (Current goals can be found in the Care Plan section)  Acute Rehab PT Goals Patient Stated Goal: return home PT Goal Formulation: With patient Time For Goal Achievement: 03/03/24 Potential to Achieve Goals: Fair    Frequency Min 1X/week     Co-evaluation               AM-PAC PT "6 Clicks" Mobility  Outcome Measure Help needed turning from your back to your side while in a flat bed without using bedrails?: None Help needed moving from lying on your back to sitting on the side of a flat bed without using bedrails?: None Help needed moving to and from a bed to a chair (including a wheelchair)?: A Little Help needed standing up from a chair using your arms (e.g., wheelchair or bedside chair)?: A Little Help needed to walk in hospital room?: A Little Help needed climbing 3-5 steps with a railing? : A Little 6 Click Score: 20    End of Session   Activity Tolerance: Patient  tolerated treatment well Patient left: in chair;with call bell/phone within reach;with chair alarm set Nurse Communication: Mobility status PT Visit Diagnosis: Other abnormalities of gait and mobility (R26.89);Difficulty in walking, not elsewhere classified (R26.2)    Time: 9629-5284 PT Time Calculation (min) (ACUTE ONLY): 17 min   Charges:   PT Evaluation $PT Eval Low Complexity: 1 Low   PT General Charges $$ ACUTE PT VISIT: 1 Visit         Annis Baseman, PT Acute Rehabilitation Services Office: [phone]   Jackey Mary Evie Croston 02/18/2024, 1:48 PM

## 2024-02-19 ENCOUNTER — Encounter (HOSPITAL_COMMUNITY): Payer: Self-pay | Admitting: Internal Medicine

## 2024-02-19 DIAGNOSIS — D649 Anemia, unspecified: Secondary | ICD-10-CM | POA: Diagnosis not present

## 2024-02-19 DIAGNOSIS — F411 Generalized anxiety disorder: Secondary | ICD-10-CM | POA: Diagnosis not present

## 2024-02-19 DIAGNOSIS — F10930 Alcohol use, unspecified with withdrawal, uncomplicated: Secondary | ICD-10-CM | POA: Diagnosis not present

## 2024-02-19 MED ORDER — LORAZEPAM 1 MG PO TABS: 1.0000 mg | ORAL_TABLET | Freq: Three times a day (TID) | ORAL | Status: DC | PRN

## 2024-02-19 MED ORDER — QUETIAPINE FUMARATE 25 MG PO TABS
25.0000 mg | ORAL_TABLET | Freq: Every day | ORAL | Status: DC
  Administered 2024-02-20: 25 mg via ORAL
  Filled 2024-02-19: qty 1

## 2024-02-19 MED ORDER — QUETIAPINE FUMARATE 100 MG PO TABS
300.0000 mg | ORAL_TABLET | Freq: Every day | ORAL | Status: DC
  Administered 2024-02-19: 300 mg via ORAL
  Filled 2024-02-19: qty 3

## 2024-02-19 MED ORDER — QUETIAPINE FUMARATE 100 MG PO TABS: 200.0000 mg | ORAL_TABLET | Freq: Every day | ORAL | Status: DC

## 2024-02-19 NOTE — Progress Notes (Signed)
 [facility]                                                                               Carla [facility], is a 63 y.o. female, DOB - Feb 19, 1961, ZOX:[id number] Admit date - 02/15/2024    Outpatient Primary MD for the patient is Tivis Forster, Hoy Mackintosh, NP  LOS - 3  days    Brief summary   63 year old female history of alcohol abuse, depression, anxiety referred to the ED from behavioral health center after patient was being found to be confused. Patient was at behavioral health center voluntary for alcohol detox and sent to the ED for further evaluation. CT head done with no acute abnormalities.   Assessment & Plan    Assessment and Plan:   Acute encephalopathy probably secondary to alcohol intoxication/ dependence and withdrawals Currently patient is alert and oriented x 3 but with bouts of delirium.  CT head is negative for acute abnormalities Ammonia and TSH levels are within normal limits patient appears to be at baseline at this time  Hypokalemia and hypomagnesemia Replaced, repeat levels wnl   Anemia of chronic disease Hemoglobin stable around 10.4  Left sided rib pain X rays of the ribs ordered showed Minimally displaced fractures of the right posterior 8th-10th ribs. Pain control.   Clavicular fracture on the left  X rays showed Healing subacute oblique fracture of the distal lateral clavicle shaft. No significant displacement. There is persistent fracture line lucency and incomplete healing at this time. Orthopedics Dr Adrain Alar consulted, recommended sling.  Outpatient follow up with orthopedics in one week.    Depression and anxiety continue with home medications . Psychiatry consulted for recommendations.      Estimated body mass index is 17.03 kg/m as calculated from the following:   Height as of this encounter: 5\' 4"  (1.626 m).   Weight as of this encounter: 45 kg.  Code Status: full code.  DVT Prophylaxis:  enoxaparin  (LOVENOX ) injection 30 mg  Start: 02/16/24 1000   Level of Care: Level of care: Telemetry Medical Family Communication: NONE AT BEDSIDE.  Disposition Plan:     Remains inpatient appropriate:  pending   Procedures:  None.   Consultants:   Psychiatry.   Antimicrobials:   Anti-infectives (From admission, onward)    None        Medications  Scheduled Meds:  vitamin B-12  500 mcg Oral Daily   enoxaparin  (LOVENOX ) injection  30 mg Subcutaneous Q24H   FLUoxetine   40 mg Oral Daily   folic acid   1 mg Oral Daily   gabapentin   300 mg Oral TID   magnesium  oxide  800 mg Oral BID   multivitamin with minerals  1 tablet Oral Daily   nicotine   21 mg Transdermal Daily   pantoprazole   40 mg Oral Daily   QUEtiapine   400 mg Oral QHS   QUEtiapine   50 mg Oral Daily   thiamine   100 mg Oral Daily   traZODone   200 mg Oral QHS   Continuous Infusions: PRN Meds:.acetaminophen , ketorolac , LORazepam     Subjective:   Carla Martinez was seen and examined today.  Pt trying to go home. IVC'ED   Objective:  Vitals:   02/18/24 2001 02/19/24 0005 02/19/24 0505 02/19/24 1209  BP: 109/69 120/68 102/65 97/66  Pulse: 71 65 72 81  Resp: 17 17 17    Temp: 98.1 F (36.7 C) 97.6 F (36.4 C) 97.8 F (36.6 C)   TempSrc: Oral Oral Oral   SpO2: 98% 96% 97% 99%  Weight:      Height:       No intake or output data in the 24 hours ending 02/19/24 1422  Filed Weights   02/15/24 1840  Weight: 45 kg     Exam General exam: Appears calm and comfortable  Respiratory system: Clear to auscultation. Respiratory effort normal. Cardiovascular system: S1 & S2 heard, RRR. No JVD,  Gastrointestinal system: Abdomen is nondistended, soft and nontender.  Central nervous system: Alert and oriented to person and place only Extremities: Symmetric 5 x 5 power. Skin: No rashes, Psychiatry: delirious     Data Reviewed:  I have personally reviewed following labs and imaging studies   CBC Lab Results  Component Value Date   WBC  7.1 02/17/2024   RBC 3.51 (L) 02/17/2024   HGB 10.7 (L) 02/17/2024   HCT 31.8 (L) 02/17/2024   MCV 90.6 02/17/2024   MCH 30.5 02/17/2024   PLT 301 02/17/2024   MCHC 33.6 02/17/2024   RDW 12.9 02/17/2024   LYMPHSABS 2.3 02/16/2024   MONOABS 0.5 02/16/2024   EOSABS 0.2 02/16/2024   BASOSABS 0.0 02/16/2024     Last metabolic panel Lab Results  Component Value Date   NA 139 02/17/2024   K 3.7 02/17/2024   CL 106 02/17/2024   CO2 23 02/17/2024   BUN 8 02/17/2024   CREATININE 0.79 02/17/2024   GLUCOSE 89 02/17/2024   GFRNONAA >60 02/17/2024   CALCIUM  8.3 (L) 02/17/2024   PHOS 2.0 (L) 03/06/2022   PROT 5.4 (L) 02/16/2024   ALBUMIN  3.1 (L) 02/16/2024   BILITOT 0.7 02/16/2024   ALKPHOS 67 02/16/2024   AST 22 02/16/2024   ALT 15 02/16/2024   ANIONGAP 10 02/17/2024    CBG (last 3)  No results for input(s): "GLUCAP" in the last 72 hours.    Coagulation Profile: No results for input(s): "INR", "PROTIME" in the last 168 hours.   Radiology Studies: DG Clavicle Left Result Date: 02/17/2024 CLINICAL DATA:  Closed left clavicle shaft fracture. EXAM: LEFT CLAVICLE - 2+ VIEWS COMPARISON:  Left shoulder radiographs 06/25/2023 FINDINGS: There is diffuse decreased bone mineralization. There is moderate healing sclerosis across the previously seen oblique fracture of the distal lateral clavicle shaft. There is also sclerosis within the marrow adjacent to the fracture. Mild persistent fracture line lucency. Up to 2 mm superior and 2 mm inferior cortical step-off of the lateral fracture component respect of the medial fracture component. Normal alignment of the acromioclavicular joint. The glenohumeral joint is normally aligned. No dislocation. IMPRESSION: Healing subacute oblique fracture of the distal lateral clavicle shaft. No significant displacement. There is persistent fracture line lucency and incomplete healing at this time. Recommend clinical correlation for possible repeat fracture in  the same location as the acute fracture seen on prior remote 06/25/2023 radiographs nearly 8 months ago. Electronically Signed   By: Bertina Broccoli M.D.   On: 02/17/2024 20:18   DG Ribs Bilateral Result Date: 02/17/2024 CLINICAL DATA:  Fall and bilateral rib pain. EXAM: BILATERAL RIBS - 3+ VIEW COMPARISON:  Chest radiograph dated 02/15/2024. FINDINGS: Mildly displaced fracture of the lateral left clavicle. Minimally displaced fracture of the right posterior 8th-10th  ribs. The bones are osteopenic. Bibasilar atelectasis. No pneumothorax. Stable cardiac silhouette. IMPRESSION: 1. Minimally displaced fractures of the right posterior 8th-10th ribs. No pneumothorax. 2. Mildly displaced fracture of the lateral left clavicle. Electronically Signed   By: Angus Bark M.D.   On: 02/17/2024 15:28       Feliciana Horn M.D. [facility] 02/19/2024, 2:22 PM  Available via Epic secure chat 7am-7pm After 7 pm, please refer to night coverage provider listed on amion.

## 2024-02-19 NOTE — Plan of Care (Signed)

## 2024-02-19 NOTE — Progress Notes (Signed)
 Psychosocial Progressive/Outcome: ANOx1, calm and cooperative  Pain/Comfort Progression/Outcome: Pt did not complain of pain during shift   Clinical Progression/Outcome: Adequate fluid and diet intake  Independent in room NWB LUE and in sling  Voids to BR as needed  No BM reported per pt  Slept in between care Maintained safety   IVC and sitter initiated

## 2024-02-19 NOTE — TOC Progression Note (Signed)
 Transition of Care [facility]) - Progression Note    Patient Details  Name: Carla Martinez MRN: [id number] Date of Birth: 08/20/1961  Transition of Care [facility]) CM/SW Contact  Kalilah Barua A [place], LCSW Phone Number: 02/19/2024, 11:30 AM  Clinical Narrative:     Pt cannot DC back to [facility] due to sling as pt would need supervision at facility and they cannot take pt's at the level of acuity. Pt to DC home, outpatient AUD resources in pt's AVS.    TOC will continue to follow.    Expected Discharge Plan: Home/Self Care Barriers to Discharge: Continued Medical Work up  Expected Discharge Plan and Services       Living arrangements for the past 2 months: Single Family Home                                       Social Determinants of Health (SDOH) Interventions SDOH Screenings   Food Insecurity: No Food Insecurity (02/16/2024)  Housing: Low Risk  (02/16/2024)  Transportation Needs: No Transportation Needs (02/16/2024)  Utilities: Not At Risk (02/16/2024)  Alcohol Screen: Low Risk  (09/04/2021)  Depression (PHQ2-9): High Risk (02/12/2024)  Tobacco Use: High Risk (02/19/2024)    Readmission Risk Interventions     No data to display

## 2024-02-19 NOTE — Progress Notes (Signed)
 Mobility Specialist: Progress Note   02/19/24 1148  Mobility  Activity Ambulated independently in hallway  Level of Assistance Independent  Assistive Device None  Distance Ambulated (ft) 90 ft  Activity Response Tolerated well  Mobility Referral Yes  Mobility visit 1 Mobility  Mobility Specialist Start Time (ACUTE ONLY) 0900  Mobility Specialist Stop Time (ACUTE ONLY) 0909  Mobility Specialist Time Calculation (min) (ACUTE ONLY) 9 min    Pt received standing in doorway with bag stating she needed to go home. Did not have sling on. MS donned sling and educated pt on WB precaution for LUE. Kept responding "I need to go home" and "That doesn't make any sense" when staff was trying to educate her on why she needed to stay. Staff was able to calm her down enough for her to return to her room and sit in the chair, pt then transferred to the bed after a few minutes. Left in bed with all needs met, call bell in reach.   Deloria Fetch Mobility Specialist Please contact via SecureChat or Rehab office at [phone]

## 2024-02-19 NOTE — Progress Notes (Signed)
 Pt's mother called for an update on the pt. While giving an update on the pt, she stated that she was not notified of the pt's situation or that she was admitted. Pt's mother also stated that she is the pt's POA. I asked for the POA paperwork to be brought and the mother stated that she would try to find the paperwork
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
## 2024-02-19 NOTE — TOC Progression Note (Signed)
 Transition of Care [facility]) - Progression Note    Patient Details  Name: Carla Martinez MRN: [id number] Date of Birth: 1960/10/25  Transition of Care [facility]) CM/SW Contact  Yaneliz Radebaugh A [place], LCSW Phone Number: 02/19/2024, 4:12 PM  Clinical Narrative:     CSW spoke with pt at bedside regarding options for outpatient follow up. Pt declined referral, CSW provided resources at bedside in case pt wants to follow up at discharge.   CSW was informed pt needed to be IVC'd. CSW completed IVC paperwork. Magistrate office contacted, [employer] to follow up with completed IVC paperwork to unit.    TOC will continue to follow.   Expected Discharge Plan: Home/Self Care Barriers to Discharge: Continued Medical Work up  Expected Discharge Plan and Services       Living arrangements for the past 2 months: Single Family Home                                       Social Determinants of Health (SDOH) Interventions SDOH Screenings   Food Insecurity: No Food Insecurity (02/16/2024)  Housing: Low Risk  (02/16/2024)  Transportation Needs: No Transportation Needs (02/16/2024)  Utilities: Not At Risk (02/16/2024)  Alcohol Screen: Low Risk  (09/04/2021)  Depression (PHQ2-9): High Risk (02/12/2024)  Tobacco Use: High Risk (02/19/2024)    Readmission Risk Interventions     No data to display

## 2024-02-20 DIAGNOSIS — F10221 Alcohol dependence with intoxication delirium: Secondary | ICD-10-CM | POA: Diagnosis not present

## 2024-02-20 DIAGNOSIS — F10930 Alcohol use, unspecified with withdrawal, uncomplicated: Secondary | ICD-10-CM | POA: Diagnosis not present

## 2024-02-20 DIAGNOSIS — D649 Anemia, unspecified: Secondary | ICD-10-CM | POA: Diagnosis not present

## 2024-02-20 DIAGNOSIS — F411 Generalized anxiety disorder: Secondary | ICD-10-CM | POA: Diagnosis not present

## 2024-02-20 LAB — CBC WITH DIFFERENTIAL/PLATELET
Abs Immature Granulocytes: 0.01 10*3/uL (ref 0.00–0.07)
Basophils Absolute: 0 10*3/uL (ref 0.0–0.1)
Basophils Relative: 0 %
Eosinophils Absolute: 0.2 10*3/uL (ref 0.0–0.5)
Eosinophils Relative: 2 %
HCT: 34.8 % — ABNORMAL LOW (ref 36.0–46.0)
Hemoglobin: 11.6 g/dL — ABNORMAL LOW (ref 12.0–15.0)
Immature Granulocytes: 0 %
Lymphocytes Relative: 39 %
Lymphs Abs: 2.7 10*3/uL (ref 0.7–4.0)
MCH: 30.7 pg (ref 26.0–34.0)
MCHC: 33.3 g/dL (ref 30.0–36.0)
MCV: 92.1 fL (ref 80.0–100.0)
Monocytes Absolute: 0.6 10*3/uL (ref 0.1–1.0)
Monocytes Relative: 9 %
Neutro Abs: 3.5 10*3/uL (ref 1.7–7.7)
Neutrophils Relative %: 50 %
Platelets: 298 10*3/uL (ref 150–400)
RBC: 3.78 MIL/uL — ABNORMAL LOW (ref 3.87–5.11)
RDW: 13.2 % (ref 11.5–15.5)
WBC: 7 10*3/uL (ref 4.0–10.5)
nRBC: 0 % (ref 0.0–0.2)

## 2024-02-20 LAB — BASIC METABOLIC PANEL WITH GFR
Anion gap: 12 (ref 5–15)
BUN: 10 mg/dL (ref 6–20)
CO2: 29 mmol/L (ref 22–32)
Calcium: 9.6 mg/dL (ref 8.9–10.3)
Chloride: 100 mmol/L (ref 98–111)
Creatinine, Ser: 0.91 mg/dL (ref 0.44–1.00)
GFR, Estimated: >60 mL/min (ref >60)
Glucose, Bld: 108 mg/dL — ABNORMAL HIGH (ref 70–99)
Potassium: 3.7 mmol/L (ref 3.5–5.1)
Sodium: 141 mmol/L (ref 135–145)

## 2024-02-20 MED ORDER — QUETIAPINE FUMARATE 100 MG PO TABS
100.0000 mg | ORAL_TABLET | Freq: Three times a day (TID) | ORAL | Status: DC
  Administered 2024-02-20 – 2024-02-21 (×3): 100 mg via ORAL
  Filled 2024-02-20 (×3): qty 1
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
## 2024-02-20 NOTE — Progress Notes (Signed)
 [facility]                                                                               Carla Martinez, is a 63 y.o. female, DOB - [birth year], ZOX:[id number] Admit date - 02/15/2024    Outpatient Primary MD for the patient is Tivis Forster, Hoy Mackintosh, NP  LOS - 4  days    Brief summary   62 year old female history of alcohol abuse, depression, anxiety referred to the ED from behavioral health center after patient was being found to be confused. Patient was at behavioral health center voluntary for alcohol detox and sent to the ED for further evaluation. CT head done with no acute abnormalities.   Assessment & Plan    Assessment and Plan:   Acute encephalopathy probably secondary to alcohol intoxication/ dependence and withdrawals Currently patient is alert and oriented x 3 but with bouts of delirium.  CT head is negative for acute abnormalities Ammonia and TSH levels are within normal limits She is requesting to go home.    Hypokalemia and hypomagnesemia Replaced, repeat levels wnl   Anemia of chronic disease Hemoglobin stable around 10.4  Left sided rib pain X rays of the ribs ordered showed Minimally displaced fractures of the right posterior 8th-10th ribs. Pain control.   Clavicular fracture on the left  X rays showed Healing subacute oblique fracture of the distal lateral clavicle shaft. No significant displacement. There is persistent fracture line lucency and incomplete healing at this time. Orthopedics Dr Adrain Alar consulted, recommended sling.  Outpatient follow up with orthopedics in one week.    Depression and anxiety continue with home medications . Psychiatry consulted, recommended to continue with prozac  40 mg daily, gabepentin 300mg  TID and seroquel  400 mg at bedtime and Seroquel  50 mg daily.  EKG repeated.  Psychiatry consulted recommended IVC , on 5/2.      Estimated body mass index is 17.03 kg/m as calculated from the following:   Height as  of this encounter: 5\' 4"  (1.626 m).   Weight as of this encounter: 45 kg.  Code Status: full code.  DVT Prophylaxis:  enoxaparin  (LOVENOX ) injection 30 mg Start: 02/16/24 1000   Level of Care: Level of care: Telemetry Medical Family Communication: NONE AT BEDSIDE.  Disposition Plan:     Remains inpatient appropriate:  pending   Procedures:  None.   Consultants:   Psychiatry.   Antimicrobials:   Anti-infectives (From admission, onward)    None        Medications  Scheduled Meds:  vitamin B-12  500 mcg Oral Daily   enoxaparin  (LOVENOX ) injection  30 mg Subcutaneous Q24H   FLUoxetine   40 mg Oral Daily   folic acid   1 mg Oral Daily   gabapentin   300 mg Oral TID   magnesium  oxide  800 mg Oral BID   multivitamin with minerals  1 tablet Oral Daily   nicotine   21 mg Transdermal Daily   pantoprazole   40 mg Oral Daily   QUEtiapine   100 mg Oral TID   thiamine   100 mg Oral Daily   traZODone   200 mg Oral QHS   Continuous Infusions: PRN Meds:.acetaminophen , LORazepam   Subjective:   Carla Martinez was seen and examined today. She is angry and wanted to go home. She denies any chest pain. Sob.   Objective:   Vitals:   02/19/24 0505 02/19/24 1209 02/20/24 0608 02/20/24 0834  BP: 102/65 97/66 102/67 93/60  Pulse: 72 81 (!) 105 79  Resp: 17  19 18   Temp: 97.8 F (36.6 C)  98.6 F (37 C) 98.2 F (36.8 C)  TempSrc: Oral  Oral Oral  SpO2: 97% 99% 95% 95%  Weight:      Height:        Intake/Output Summary (Last 24 hours) at 02/20/2024 1501 Last data filed at 02/20/2024 1433 Gross per 24 hour  Intake 960 ml  Output --  Net 960 ml    Filed Weights   02/15/24 1840  Weight: 45 kg     Exam General exam: Appears calm and comfortable  Respiratory system: Clear to auscultation. Respiratory effort normal. Cardiovascular system: S1 & S2 heard, RRR. No JVD, Gastrointestinal system: Abdomen is nondistended, soft and nontender. Central nervous system: Alert and  oriented to person and place.  Extremities: Symmetric 5 x 5 power. Skin: No rashes, lesions or ulcers Psychiatry: anxious, restless     Data Reviewed:  I have personally reviewed following labs and imaging studies   CBC Lab Results  Component Value Date   WBC 7.0 02/20/2024   RBC 3.78 (L) 02/20/2024   HGB 11.6 (L) 02/20/2024   HCT 34.8 (L) 02/20/2024   MCV 92.1 02/20/2024   MCH 30.7 02/20/2024   PLT 298 02/20/2024   MCHC 33.3 02/20/2024   RDW 13.2 02/20/2024   LYMPHSABS 2.7 02/20/2024   MONOABS 0.6 02/20/2024   EOSABS 0.2 02/20/2024   BASOSABS 0.0 02/20/2024     Last metabolic panel Lab Results  Component Value Date   NA 141 02/20/2024   K 3.7 02/20/2024   CL 100 02/20/2024   CO2 29 02/20/2024   BUN 10 02/20/2024   CREATININE 0.91 02/20/2024   GLUCOSE 108 (H) 02/20/2024   GFRNONAA >60 02/20/2024   CALCIUM  9.6 02/20/2024   PHOS 2.0 (L) 03/06/2022   PROT 5.4 (L) 02/16/2024   ALBUMIN  3.1 (L) 02/16/2024   BILITOT 0.7 02/16/2024   ALKPHOS 67 02/16/2024   AST 22 02/16/2024   ALT 15 02/16/2024   ANIONGAP 12 02/20/2024    CBG (last 3)  No results for input(s): "GLUCAP" in the last 72 hours.    Coagulation Profile: No results for input(s): "INR", "PROTIME" in the last 168 hours.   Radiology Studies: No results found.      Feliciana Horn M.D. [facility] 02/20/2024, 3:01 PM  Available via Epic secure chat 7am-7pm After 7 pm, please refer to night coverage provider listed on amion.

## 2024-02-20 NOTE — Plan of Care (Signed)

## 2024-02-20 NOTE — Progress Notes (Addendum)
 Mobility Specialist: Progress Note   02/20/24 0953  Mobility  Activity Refused mobility  Mobility Specialist Start Time (ACUTE ONLY) 0945    Pt refused mobility - no reason stated.   Carla Martinez Mobility Specialist Please contact via SecureChat or Rehab office at [phone]

## 2024-02-21 DIAGNOSIS — F10930 Alcohol use, unspecified with withdrawal, uncomplicated: Secondary | ICD-10-CM | POA: Diagnosis not present

## 2024-02-21 DIAGNOSIS — F411 Generalized anxiety disorder: Secondary | ICD-10-CM | POA: Diagnosis not present

## 2024-02-21 DIAGNOSIS — F10221 Alcohol dependence with intoxication delirium: Secondary | ICD-10-CM

## 2024-02-21 DIAGNOSIS — D649 Anemia, unspecified: Secondary | ICD-10-CM | POA: Diagnosis not present

## 2024-02-21 LAB — PHOSPHORUS: Phosphorus: 3.4 mg/dL (ref 2.5–4.6)

## 2024-02-21 LAB — POTASSIUM: Potassium: 3.5 mmol/L (ref 3.5–5.1)

## 2024-02-21 LAB — MAGNESIUM: Magnesium: 1.6 mg/dL — ABNORMAL LOW (ref 1.7–2.4)

## 2024-02-21 MED ORDER — QUETIAPINE FUMARATE 100 MG PO TABS
100.0000 mg | ORAL_TABLET | Freq: Every day | ORAL | Status: DC
  Administered 2024-02-21 – 2024-02-22 (×2): 100 mg via ORAL
  Filled 2024-02-21 (×2): qty 1

## 2024-02-21 MED ORDER — QUETIAPINE FUMARATE 25 MG PO TABS
50.0000 mg | ORAL_TABLET | Freq: Two times a day (BID) | ORAL | Status: DC
  Administered 2024-02-21 – 2024-02-23 (×4): 50 mg via ORAL
  Filled 2024-02-21 (×4): qty 2

## 2024-02-21 MED ORDER — MAGNESIUM SULFATE 4 GM/100ML IV SOLN
4.0000 g | Freq: Once | INTRAVENOUS | Status: AC
  Administered 2024-02-21: 4 g via INTRAVENOUS
  Filled 2024-02-21: qty 100

## 2024-02-21 NOTE — Progress Notes (Signed)
 [facility]                                                                               [name], is a 63 y.o. female, DOB - 07-16-1961, UEA:[id number] Admit date - 02/15/2024    Outpatient Primary MD for the patient is Carla Martinez, Carla Mackintosh, NP  LOS - 5  days    Brief summary   64 year old female history of alcohol abuse, depression, anxiety referred to the ED from behavioral health center after patient was being found to be confused. Patient was at behavioral health center voluntary for alcohol detox and sent to the ED for further evaluation. CT head done with no acute abnormalities.   Assessment & Plan    Assessment and Plan:   Acute encephalopathy probably secondary to alcohol intoxication/ dependence and withdrawals Currently patient is alert and oriented x 3 but with bouts of delirium.  She is more calm and receptive to staying.  CT head is negative for acute abnormalities Ammonia and TSH levels are within normal limits She is requesting to go home.    Hypokalemia and hypomagnesemia Replaced, rpt levels in am.    Anemia of chronic disease Hemoglobin stable around 10.4  Left sided rib pain X rays of the ribs ordered showed Minimally displaced fractures of the right posterior 8th-10th ribs. Pain control.   Clavicular fracture on the left  X rays showed Healing subacute oblique fracture of the distal lateral clavicle shaft. No significant displacement. There is persistent fracture line lucency and incomplete healing at this time. Orthopedics Dr Adrain Alar consulted, recommended sling.  Outpatient follow up with orthopedics in one week.  Pain control.    Depression and anxiety continue with home medications . Psychiatry consulted, recommended to continue with prozac  40 mg daily, gabepentin 300mg  TID and seroquel  400 mg at bedtime and Seroquel  50 mg daily.  EKG repeated and showed qtc 472.  Psychiatry consulted recommended IVC , on 5/2.      Estimated  body mass index is 17.03 kg/m as calculated from the following:   Height as of this encounter: 5\' 4"  (1.626 m).   Weight as of this encounter: 45 kg.  Code Status: full code.  DVT Prophylaxis:  enoxaparin  (LOVENOX ) injection 30 mg Start: 02/16/24 1000   Level of Care: Level of care: Telemetry Medical Family Communication: NONE AT BEDSIDE.  Disposition Plan:     Remains inpatient appropriate:  pending   Procedures:  None.   Consultants:   Psychiatry.   Antimicrobials:   Anti-infectives (From admission, onward)    None        Medications  Scheduled Meds:  vitamin B-12  500 mcg Oral Daily   enoxaparin  (LOVENOX ) injection  30 mg Subcutaneous Q24H   FLUoxetine   40 mg Oral Daily   folic acid   1 mg Oral Daily   gabapentin   300 mg Oral TID   magnesium  oxide  800 mg Oral BID   multivitamin with minerals  1 tablet Oral Daily   nicotine   21 mg Transdermal Daily   pantoprazole   40 mg Oral Daily   QUEtiapine   100 mg Oral QHS   QUEtiapine   50 mg Oral  BID   thiamine   100 mg Oral Daily   traZODone   200 mg Oral QHS   Continuous Infusions: PRN Meds:.acetaminophen , LORazepam     Subjective:   Carla Martinez was seen and examined today. She is calmer. No chest pain or sob, some pain in the arm.   Objective:   Vitals:   02/20/24 1709 02/20/24 2356 02/21/24 0900 02/21/24 1810  BP: 101/63 (!) 85/64 102/66 104/65  Pulse: 79 64 66 63  Resp: 16 16 16    Temp: 98.7 F (37.1 C) 98.4 F (36.9 C) 98.7 F (37.1 C) 97.8 F (36.6 C)  TempSrc: Oral Oral Oral Oral  SpO2: 96% 95% 97% 97%  Weight:      Height:        Intake/Output Summary (Last 24 hours) at 02/21/2024 2000 Last data filed at 02/21/2024 1307 Gross per 24 hour  Intake 1200 ml  Output --  Net 1200 ml    Filed Weights   02/15/24 1840  Weight: 45 kg     Exam General exam: Appears calm and comfortable  Respiratory system: Clear to auscultation. Respiratory effort normal. Cardiovascular system: S1 & S2 heard,  RRR. No JVD,  Gastrointestinal system: Abdomen is nondistended, soft and nontender.  Central nervous system: Alert and oriented. No focal neurological deficits. Extremities: Symmetric 5 x 5 power. Skin: No rashes, lesions or ulcers Psychiatry: Mood & affect appropriate.       Data Reviewed:  I have personally reviewed following labs and imaging studies   CBC Lab Results  Component Value Date   WBC 7.0 02/20/2024   RBC 3.78 (L) 02/20/2024   HGB 11.6 (L) 02/20/2024   HCT 34.8 (L) 02/20/2024   MCV 92.1 02/20/2024   MCH 30.7 02/20/2024   PLT 298 02/20/2024   MCHC 33.3 02/20/2024   RDW 13.2 02/20/2024   LYMPHSABS 2.7 02/20/2024   MONOABS 0.6 02/20/2024   EOSABS 0.2 02/20/2024   BASOSABS 0.0 02/20/2024     Last metabolic panel Lab Results  Component Value Date   NA 141 02/20/2024   K 3.5 02/21/2024   CL 100 02/20/2024   CO2 29 02/20/2024   BUN 10 02/20/2024   CREATININE 0.91 02/20/2024   GLUCOSE 108 (H) 02/20/2024   GFRNONAA >60 02/20/2024   CALCIUM  9.6 02/20/2024   PHOS 3.4 02/21/2024   PROT 5.4 (L) 02/16/2024   ALBUMIN  3.1 (L) 02/16/2024   BILITOT 0.7 02/16/2024   ALKPHOS 67 02/16/2024   AST 22 02/16/2024   ALT 15 02/16/2024   ANIONGAP 12 02/20/2024    CBG (last 3)  No results for input(s): "GLUCAP" in the last 72 hours.    Coagulation Profile: No results for input(s): "INR", "PROTIME" in the last 168 hours.   Radiology Studies: No results found.      Feliciana Horn M.D. [facility] 02/21/2024, 8:00 PM  Available via Epic secure chat 7am-7pm After 7 pm, please refer to night coverage provider listed on amion.

## 2024-02-21 NOTE — Plan of Care (Signed)
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
## 2024-02-22 DIAGNOSIS — F10930 Alcohol use, unspecified with withdrawal, uncomplicated: Secondary | ICD-10-CM | POA: Diagnosis not present

## 2024-02-22 DIAGNOSIS — D649 Anemia, unspecified: Secondary | ICD-10-CM | POA: Diagnosis not present

## 2024-02-22 DIAGNOSIS — F411 Generalized anxiety disorder: Secondary | ICD-10-CM | POA: Diagnosis not present

## 2024-02-22 LAB — PHOSPHORUS: Phosphorus: 3.2 mg/dL (ref 2.5–4.6)

## 2024-02-22 LAB — MAGNESIUM: Magnesium: 2.1 mg/dL (ref 1.7–2.4)

## 2024-02-22 LAB — POTASSIUM: Potassium: 3.9 mmol/L (ref 3.5–5.1)

## 2024-02-22 NOTE — Plan of Care (Signed)
  Problem: Education: Goal: Knowledge of General Education information will improve Description: Including pain rating scale, medication(s)/side effects and non-pharmacologic comfort measures Outcome: Progressing   Problem: Health Behavior/Discharge Planning: Goal: Ability to manage health-related needs will improve Outcome: Progressing   Problem: Clinical Measurements: Goal: Ability to maintain clinical measurements within normal limits will improve Outcome: Progressing Goal: Will remain free from infection Outcome: Progressing Goal: Diagnostic test results will improve Outcome: Progressing   Problem: Activity: Goal: Risk for activity intolerance will decrease Outcome: Progressing   Problem: Coping: Goal: Level of anxiety will decrease Outcome: Progressing   Problem: Elimination: Goal: Will not experience complications related to bowel motility Outcome: Progressing Goal: Will not experience complications related to urinary retention Outcome: Progressing   Problem: Pain Managment: Goal: General experience of comfort will improve and/or be controlled Outcome: Progressing   Problem: Safety: Goal: Ability to remain free from injury will improve Outcome: Progressing   Problem: Skin Integrity: Goal: Risk for impaired skin integrity will decrease Outcome: Progressing

## 2024-02-22 NOTE — Plan of Care (Signed)
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
## 2024-02-22 NOTE — Progress Notes (Signed)
 [facility]                                                                               Carla Martinez, is a 63 y.o. female, DOB - [birth year], DDU:[id number] Admit date - 02/15/2024    Outpatient Primary MD for the patient is Tivis Forster, Hoy Mackintosh, NP  LOS - 6  days    Brief summary   63 year old female history of alcohol abuse, depression, anxiety referred to the ED from behavioral health center after patient was being found to be confused. Patient was at behavioral health center voluntary for alcohol detox and sent to the ED for further evaluation. CT head done with no acute abnormalities.   Assessment & Plan    Assessment and Plan:   Acute encephalopathy probably secondary to alcohol intoxication/ dependence and withdrawals Currently patient is alert and oriented x 3 but with bouts of delirium.  She is more calm and receptive to staying.  CT head is negative for acute abnormalities Ammonia and TSH levels are within normal limits She is requesting to go home.    Hypokalemia and hypomagnesemia Replaced, rpt levels in am.    Anemia of chronic disease Hemoglobin stable around 10.4  Left sided rib pain X rays of the ribs ordered showed Minimally displaced fractures of the right posterior 8th-10th ribs. Pain control.   Clavicular fracture on the left  X rays showed Healing subacute oblique fracture of the distal lateral clavicle shaft. No significant displacement. There is persistent fracture line lucency and incomplete healing at this time. Orthopedics Dr Adrain Alar consulted, recommended sling.  Outpatient follow up with orthopedics in one week.  Pain control.    Depression and anxiety continue with home medications . Psychiatry consulted, recommended to continue with prozac  40 mg daily, gabepentin 300mg  TID and seroquel  400 mg at bedtime and Seroquel  50 mg daily.  EKG repeated and showed qtc 472.  Psychiatry on board and rescind the  IVC today.  Possible d.c in  am.     Estimated body mass index is 17.03 kg/m as calculated from the following:   Height as of this encounter: 5\' 4"  (1.626 m).   Weight as of this encounter: 45 kg.  Code Status: full code.  DVT Prophylaxis:  enoxaparin  (LOVENOX ) injection 30 mg Start: 02/16/24 1000   Level of Care: Level of care: Telemetry Medical Family Communication: NONE AT BEDSIDE.  Disposition Plan:     Remains inpatient appropriate:  pending   Procedures:  None.   Consultants:   Psychiatry.   Antimicrobials:   Anti-infectives (From admission, onward)    None        Medications  Scheduled Meds:  vitamin B-12  500 mcg Oral Daily   enoxaparin  (LOVENOX ) injection  30 mg Subcutaneous Q24H   FLUoxetine   40 mg Oral Daily   folic acid   1 mg Oral Daily   gabapentin   300 mg Oral TID   magnesium  oxide  800 mg Oral BID   multivitamin with minerals  1 tablet Oral Daily   nicotine   21 mg Transdermal Daily   pantoprazole   40 mg Oral Daily   QUEtiapine   100 mg Oral QHS  QUEtiapine   50 mg Oral BID   thiamine   100 mg Oral Daily   traZODone   200 mg Oral QHS   Continuous Infusions: PRN Meds:.acetaminophen , LORazepam     Subjective:   Carla Martinez was seen and examined today. Comfortable.  Objective:   Vitals:   02/22/24 0427 02/22/24 0755 02/22/24 1251 02/22/24 1656  BP: (!) 98/59 94/67 (!) 95/58 (!) 93/58  Pulse: (!) 57 71 65 63  Resp: 16 16 16 18   Temp: 98.1 F (36.7 C) 98.4 F (36.9 C) 98.5 F (36.9 C) 98.7 F (37.1 C)  TempSrc: Oral Oral Oral Oral  SpO2: 95% 94% 95% 93%  Weight:      Height:        Intake/Output Summary (Last 24 hours) at 02/22/2024 1836 Last data filed at 02/22/2024 1824 Gross per 24 hour  Intake 536 ml  Output --  Net 536 ml    Filed Weights   02/15/24 1840  Weight: 45 kg     Exam General exam: Appears calm and comfortable  Respiratory system: Clear to auscultation. Respiratory effort normal. Cardiovascular system: S1 & S2 heard, RRR. No JVD,  murmurs,  Gastrointestinal system: Abdomen is nondistended, soft and nontender.  Central nervous system: Alert and oriented. No focal neurological deficits.       Data Reviewed:  I have personally reviewed following labs and imaging studies   CBC Lab Results  Component Value Date   WBC 7.0 02/20/2024   RBC 3.78 (L) 02/20/2024   HGB 11.6 (L) 02/20/2024   HCT 34.8 (L) 02/20/2024   MCV 92.1 02/20/2024   MCH 30.7 02/20/2024   PLT 298 02/20/2024   MCHC 33.3 02/20/2024   RDW 13.2 02/20/2024   LYMPHSABS 2.7 02/20/2024   MONOABS 0.6 02/20/2024   EOSABS 0.2 02/20/2024   BASOSABS 0.0 02/20/2024     Last metabolic panel Lab Results  Component Value Date   NA 141 02/20/2024   K 3.9 02/22/2024   CL 100 02/20/2024   CO2 29 02/20/2024   BUN 10 02/20/2024   CREATININE 0.91 02/20/2024   GLUCOSE 108 (H) 02/20/2024   GFRNONAA >60 02/20/2024   CALCIUM  9.6 02/20/2024   PHOS 3.2 02/22/2024   PROT 5.4 (L) 02/16/2024   ALBUMIN  3.1 (L) 02/16/2024   BILITOT 0.7 02/16/2024   ALKPHOS 67 02/16/2024   AST 22 02/16/2024   ALT 15 02/16/2024   ANIONGAP 12 02/20/2024    CBG (last 3)  No results for input(s): "GLUCAP" in the last 72 hours.    Coagulation Profile: No results for input(s): "INR", "PROTIME" in the last 168 hours.   Radiology Studies: No results found.      Carla Martinez M.D. [facility] 02/22/2024, 6:36 PM  Available via Epic secure chat 7am-7pm After 7 pm, please refer to night coverage provider listed on amion.

## 2024-02-22 NOTE — Progress Notes (Signed)
 Physical Therapy Treatment Patient Details Name: Carla Martinez MRN: [id number] DOB: 1961/03/09 Today's Date: 02/22/2024   History of Present Illness 63 yo female admitted 4/28 from behavioral health (where she is seeking tx for ETOH withdrawal) with AMS and fall. Head CT (-). Lt clavicle fx and Rt 8-10 rib fx. PMhx: ETOH abuse, polysubstance use, CHF, depression, Afib, Rt femur ORIF    PT Comments  Pt with improved activity tolerance and balance although remains with significant balance deficits scoring 16 on DGI and benefits from use of cane with all standing activities to recover LOB. Pt stating surprise over balance deficits and states she has cane at home. Pt fixated on wanting to travel to [place]  in 3 days. Continue to recommend assist with mobility acutely to reduce fall risk.    If plan is discharge home, recommend the following: A little help with walking and/or transfers;Supervision due to cognitive status   Can travel by private vehicle        Equipment Recommendations  None recommended by PT (pt reports she has a cane)    Recommendations for Other Services       Precautions / Restrictions Precautions Precautions: Fall Precaution/Restrictions Comments: LUE clavicle fx Required Braces or Orthoses: Sling Restrictions LUE Weight Bearing Per Provider Order: Non weight bearing     Mobility  Bed Mobility Overal bed mobility: Modified Independent             General bed mobility comments: HOB 30 degrees    Transfers Overall transfer level: Needs assistance   Transfers: Sit to/from Stand Sit to Stand: Contact guard assist           General transfer comment: CGA for safety with balance deficits    Ambulation/Gait Ambulation/Gait assistance: Contact guard assist Gait Distance (Feet): 500 Feet Assistive device: None, Straight cane Gait Pattern/deviations: Step-through pattern, Decreased stride length   Gait velocity interpretation: 1.31 - 2.62  ft/sec, indicative of limited community ambulator   General Gait Details: pt without AD with partial LOB toward left with looking down or slowed gait speed needing tactile assist to recover LOB, improved balance without use of cane 150' without need for tactile assist   Stairs             Wheelchair Mobility     Tilt Bed    Modified Rankin (Stroke Patients Only)       Balance Overall balance assessment: Needs assistance Sitting-balance support: No upper extremity supported, Feet supported Sitting balance-Leahy Scale: Fair       Standing balance-Leahy Scale: Fair Standing balance comment: can static stand but LOB with all challenges Single Leg Stance - Right Leg: 0 Single Leg Stance - Left Leg: 1 Tandem Stance - Right Leg: 2 Tandem Stance - Left Leg: 2 Rhomberg - Eyes Opened: 15 Rhomberg - Eyes Closed: 4   High Level Balance Comments: pt with repeated trials for tandem stance and rhomberg eyes closed with improvement with each trial tandem improved from 2-8 sec over 4 repeated trials. with support of cane significant improvement in balance challenges with pt able to remain standing without additional physical assist Standardized Balance Assessment Standardized Balance Assessment : Dynamic Gait Index   Dynamic Gait Index Level Surface: Mild Impairment Change in Gait Speed: Mild Impairment Gait with Horizontal Head Turns: Mild Impairment Gait with Vertical Head Turns: Mild Impairment Gait and Pivot Turn: Normal Step Over Obstacle: Moderate Impairment Step Around Obstacles: Mild Impairment Steps: Mild Impairment Total Score: 16  Communication Communication Communication: No apparent difficulties  Cognition Arousal: Alert Behavior During Therapy: Flat affect   PT - Cognitive impairments: Problem solving, Safety/Judgement                                Cueing Cueing Techniques: Verbal cues  Exercises      General Comments         Pertinent Vitals/Pain Pain Assessment Pain Assessment: 0-10 Pain Score: 3  Pain Location: left chest Pain Descriptors / Indicators: Aching Pain Intervention(s): Limited activity within patient's tolerance, Repositioned, Monitored during session    Home Living                          Prior Function            PT Goals (current goals can now be found in the care plan section) Progress towards PT goals: Progressing toward goals    Frequency    Min 1X/week      PT Plan      Co-evaluation              AM-PAC PT "6 Clicks" Mobility   Outcome Measure  Help needed turning from your back to your side while in a flat bed without using bedrails?: None Help needed moving from lying on your back to sitting on the side of a flat bed without using bedrails?: None Help needed moving to and from a bed to a chair (including a wheelchair)?: A Little Help needed standing up from a chair using your arms (e.g., wheelchair or bedside chair)?: A Little Help needed to walk in hospital room?: A Little Help needed climbing 3-5 steps with a railing? : A Little 6 Click Score: 20    End of Session   Activity Tolerance: Patient tolerated treatment well Patient left: in chair;with call bell/phone within reach;with nursing/sitter in room Nurse Communication: Mobility status PT Visit Diagnosis: Other abnormalities of gait and mobility (R26.89);Difficulty in walking, not elsewhere classified (R26.2)     Time: 8657-8469 PT Time Calculation (min) (ACUTE ONLY): 21 min  Charges:    $Therapeutic Activity: 8-22 mins PT General Charges $$ ACUTE PT VISIT: 1 Visit                     Annis Baseman, PT Acute Rehabilitation Services Office: [phone]    Carla Martinez 02/22/2024, 10:46 AM

## 2024-02-23 DIAGNOSIS — F10931 Alcohol use, unspecified with withdrawal delirium: Secondary | ICD-10-CM

## 2024-02-23 DIAGNOSIS — F10930 Alcohol use, unspecified with withdrawal, uncomplicated: Secondary | ICD-10-CM | POA: Diagnosis not present

## 2024-02-23 DIAGNOSIS — E876 Hypokalemia: Secondary | ICD-10-CM | POA: Diagnosis not present

## 2024-02-23 LAB — CREATININE, SERUM
Creatinine, Ser: 0.95 mg/dL (ref 0.44–1.00)
GFR, Estimated: >60 mL/min (ref >60)

## 2024-02-23 MED ORDER — NICOTINE 21 MG/24HR TD PT24: 21.0000 mg | MEDICATED_PATCH | Freq: Every day | TRANSDERMAL | 0 refills | Status: DC

## 2024-02-23 MED ORDER — CYANOCOBALAMIN 500 MCG PO TABS: 500.0000 ug | ORAL_TABLET | Freq: Every day | ORAL | 3 refills | Status: DC

## 2024-02-23 MED ORDER — ADULT MULTIVITAMIN W/MINERALS CH: 1.0000 | ORAL_TABLET | Freq: Every day | ORAL | Status: DC

## 2024-02-23 MED ORDER — VITAMIN B-1 100 MG PO TABS: 100.0000 mg | ORAL_TABLET | Freq: Every day | ORAL | Status: DC

## 2024-02-23 MED ORDER — FOLIC ACID 1 MG PO TABS: 1.0000 mg | ORAL_TABLET | Freq: Every day | ORAL | Status: DC

## 2024-02-23 MED ORDER — QUETIAPINE FUMARATE 100 MG PO TABS: 100.0000 mg | ORAL_TABLET | Freq: Every day | ORAL | 0 refills | Status: DC

## 2024-02-23 MED ORDER — QUETIAPINE FUMARATE 50 MG PO TABS: 50.0000 mg | ORAL_TABLET | Freq: Two times a day (BID) | ORAL | 1 refills | Status: DC

## 2024-02-23 NOTE — Progress Notes (Signed)
   02/23/24 1422  Mobility  Activity Ambulated with assistance in hallway  Level of Assistance Contact guard assist, steadying assist  Assistive Device None  Distance Ambulated (ft) 300 ft  LUE Weight Bearing Per Provider Order NWB  Activity Response Tolerated fair  Mobility Referral Yes  Mobility visit 1 Mobility  Mobility Specialist Start Time (ACUTE ONLY) 1422  Mobility Specialist Stop Time (ACUTE ONLY) 1428  Mobility Specialist Time Calculation (min) (ACUTE ONLY) 6 min   Mobility Specialist: Progress Note  Pt agreeable to mobility session - received in EOB. C/o L sided rib pain when breathing rated 7/10. Returned to EOB with all needs met - call bell within reach.   Carla Martinez, BS Mobility Specialist Please contact via SecureChat or  Rehab office at [phone].

## 2024-02-23 NOTE — Plan of Care (Signed)

## 2024-02-23 NOTE — TOC Progression Note (Addendum)
 Transition of Care [facility]) - Progression Note    Patient Details  Name: Carla Martinez MRN: [id number] Date of Birth: 1961-06-01  Transition of Care [facility]) CM/SW Contact  Shaden Lacher A [place], LCSW Phone Number: 02/23/2024, 11:38 AM  Clinical Narrative:     Pt's IVC's rescinded. CSW sent completed Notice of Commitment Change to [employer]. Filing accepted. Estimated DC today.  No other needs identified at this time. TOC will sign off, please consult again if TOC needs arise.    Expected Discharge Plan: Home/Self Care Barriers to Discharge: Continued Medical Work up  Expected Discharge Plan and Services       Living arrangements for the past 2 months: Single Family Home                                       Social Determinants of Health (SDOH) Interventions SDOH Screenings   Food Insecurity: No Food Insecurity (02/16/2024)  Housing: Low Risk  (02/16/2024)  Transportation Needs: No Transportation Needs (02/16/2024)  Utilities: Not At Risk (02/16/2024)  Alcohol Screen: Low Risk  (09/04/2021)  Depression (PHQ2-9): High Risk (02/12/2024)  Tobacco Use: High Risk (02/19/2024)    Readmission Risk Interventions     No data to display

## 2024-02-23 NOTE — TOC Transition Note (Signed)
 Transition of Care [facility]) - Discharge Note   Patient Details  Name: Carla Martinez MRN: [id number] Date of Birth: 1961/09/10  Transition of Care [facility]) CM/SW Contact:  Dane Dung, RN Phone Number: 02/23/2024, 11:07 AM   Clinical Narrative:    CM called numerous home health agencies and was unable to obtain home health services due to patient's payor source.  Outpatient PT referral placed for patient to follow up regarding Outpatient therapy services.  Per PT note - patient already has a Medical laboratory scientific officer at home.   Final next level of care: Home/Self Care Barriers to Discharge: Continued Medical Work up   Patient Goals and CMS Choice            Discharge Placement                       Discharge Plan and Services Additional resources added to the After Visit Summary for                                       Social Drivers of Health (SDOH) Interventions SDOH Screenings   Food Insecurity: No Food Insecurity (02/16/2024)  Housing: Low Risk  (02/16/2024)  Transportation Needs: No Transportation Needs (02/16/2024)  Utilities: Not At Risk (02/16/2024)  Alcohol Screen: Low Risk  (09/04/2021)  Depression (PHQ2-9): High Risk (02/12/2024)  Tobacco Use: High Risk (02/19/2024)     Readmission Risk Interventions     No data to display

## 2024-02-26 NOTE — Discharge Summary (Signed)
 Physician Discharge Summary   Patient: Carla Martinez MRN: [id number] DOB: 12-05-1960  Admit date:     02/15/2024  Discharge date: 02/23/2024  Discharge Physician: Feliciana Horn   PCP: Dorothe Gaster, NP   Recommendations at discharge:  Please follow up with PCP in one week.  Please follow up with psychiatrist as recommended.   Discharge Diagnoses: Active Problems:   MDD (major depressive disorder), severe (HCC)   Generalized anxiety disorder   Alcohol dependence with delirium (HCC)   Alcohol withdrawal (HCC)   Anemia   Altered mental status   Hypokalemia   Hypomagnesemia  Resolved Problems:   * No resolved hospital problems. *  Hospital Course: 63 year old female history of alcohol abuse, depression, anxiety referred to the ED from behavioral health center after patient was being found to be confused. Patient was at behavioral health center voluntary for alcohol detox and sent to the ED for further evaluation. CT head done with no acute abnormalities.   Assessment and Plan:    Acute encephalopathy probably secondary to alcohol intoxication/ dependence and withdrawals Currently patient is alert and oriented x 3 but with bouts of delirium.  She is more calm and receptive to staying.  CT head is negative for acute abnormalities Ammonia and TSH levels are within normal limits She is requesting to go home.      Hypokalemia and hypomagnesemia Replaced, rpt levels in am.      Anemia of chronic disease Hemoglobin stable around 10.4   Left sided rib pain X rays of the ribs ordered showed Minimally displaced fractures of the right posterior 8th-10th ribs. Pain control.    Clavicular fracture on the left  X rays showed Healing subacute oblique fracture of the distal lateral clavicle shaft. No significant displacement. There is persistent fracture line lucency and incomplete healing at this time. Orthopedics Dr Adrain Alar consulted, recommended sling.  Outpatient follow up  with orthopedics in one week.  Pain control.      Depression and anxiety continue with home medications . Psychiatry consulted, recommended to continue with prozac  40 mg daily, gabepentin 300mg  TID and seroquel  400 mg at bedtime and Seroquel  50 mg daily.  EKG repeated and showed qtc 472.  Psychiatry on board and rescind edthe  IVC today.  Possible d.c in am.        Estimated body mass index is 17.03 kg/m as calculated from the following:   Height as of this encounter: 5\' 4"  (1.626 m).   Weight as of this encounter: 45 kg.     Consultants: pSYCHIATRY. Procedures performed: none.   Disposition: Home Diet recommendation:  Discharge Diet Orders (From admission, onward)     Start     Ordered   02/23/24 0000  Diet - low sodium heart healthy        02/23/24 1539           Regular diet DISCHARGE MEDICATION: Allergies as of 02/23/2024   No Known Allergies      Medication List     STOP taking these medications    acamprosate  333 MG tablet Commonly known as: CAMPRAL    meloxicam  15 MG tablet Commonly known as: MOBIC        TAKE these medications    CALCIUM  CARBONATE PO Take 1 tablet by mouth at bedtime.   cyanocobalamin  500 MCG tablet Commonly known as: VITAMIN B12 Take 1 tablet (500 mcg total) by mouth daily.   FLUoxetine  40 MG capsule Commonly known as: PROZAC  Take 1 capsule (40  mg total) by mouth daily.   folic acid  1 MG tablet Commonly known as: FOLVITE  Take 1 tablet (1 mg total) by mouth daily.   gabapentin  300 MG capsule Commonly known as: NEURONTIN  Take 1 capsule (300 mg total) by mouth 3 (three) times daily.   hydrOXYzine  50 MG tablet Commonly known as: ATARAX  Take 1 tablet (50 mg total) by mouth 3 (three) times daily as needed. What changed: when to take this   multivitamin with minerals Tabs tablet Take 1 tablet by mouth daily.   nicotine  21 mg/24hr patch Commonly known as: NICODERM CQ  - dosed in mg/24 hours Place 1 patch (21 mg total)  onto the skin daily.   pantoprazole  40 MG tablet Commonly known as: PROTONIX  Take 1 tablet by mouth once daily   QUEtiapine  100 MG tablet Commonly known as: SEROQUEL  Take 1 tablet (100 mg total) by mouth at bedtime. What changed:  medication strength how much to take when to take this   QUEtiapine  50 MG tablet Commonly known as: SEROQUEL  Take 1 tablet (50 mg total) by mouth 2 (two) times daily. What changed:  medication strength how much to take when to take this   thiamine  100 MG tablet Commonly known as: Vitamin B-1 Take 1 tablet (100 mg total) by mouth daily.   traZODone  100 MG tablet Commonly known as: DESYREL  Take 2 tablets (200 mg total) by mouth at bedtime.        Follow-up Information     [facility] Follow up.   Specialty: Behavioral Health Why: Please contact office to schedule initial appointment. Facility is in network with your insurance. Contact information: [address] [place] [address] [phone]         [facility] Follow up.   Contact information: [address] [place] [address] [phone]         [facility] Follow up.   Specialty: Urgent Care Contact information: [address] [place]  [phone]        [facility] Follow up.   Specialty: Rehabilitation Why: Please call and follow up regarding Outpatient rehabilitation services. Contact information: [address] [place]  [phone]               Discharge Exam: Carla Martinez Weights   02/15/24 1840  Weight: 45 kg   General exam: Appears calm and comfortable  Respiratory system: Clear to auscultation. Respiratory effort normal. Cardiovascular system: S1 & S2 heard, RRR. No JVD, murmurs, rubs, gallops or clicks. No pedal edema. Gastrointestinal system:  Abdomen is nondistended, soft and nontender. No organomegaly or masses felt. Normal bowel sounds heard. Central nervous system: Alert and oriented. No focal neurological deficits. Extremities: Symmetric 5 x 5 power. Skin: No rashes, lesions or ulcers Psychiatry: Judgement and insight appear normal. Mood & affect appropriate.    Condition at discharge: fair  The results of significant diagnostics from this hospitalization (including imaging, microbiology, ancillary and laboratory) are listed below for reference.   Imaging Studies: DG Clavicle Left Result Date: 02/17/2024 CLINICAL DATA:  Closed left clavicle shaft fracture. EXAM: LEFT CLAVICLE - 2+ VIEWS COMPARISON:  Left shoulder radiographs 06/25/2023 FINDINGS: There is diffuse decreased bone mineralization. There is moderate healing sclerosis across the previously seen oblique fracture of the distal lateral clavicle shaft. There is also sclerosis within the marrow adjacent to the fracture. Mild persistent fracture line lucency. Up to 2  mm superior and 2 mm inferior cortical step-off of the lateral fracture component respect of the medial fracture component. Normal alignment of the acromioclavicular joint. The glenohumeral joint is normally aligned. No dislocation. IMPRESSION: Healing subacute oblique fracture of the distal lateral clavicle shaft. No significant displacement. There is persistent fracture line lucency and incomplete healing at this time. Recommend clinical correlation for possible repeat fracture in the same location as the acute fracture seen on prior remote 06/25/2023 radiographs nearly 8 months ago. Electronically Signed   By: Bertina Broccoli M.D.   On: 02/17/2024 20:18   DG Ribs Bilateral Result Date: 02/17/2024 CLINICAL DATA:  Fall and bilateral rib pain. EXAM: BILATERAL RIBS - 3+ VIEW COMPARISON:  Chest radiograph dated 02/15/2024. FINDINGS: Mildly displaced fracture of the lateral left clavicle. Minimally displaced fracture of  the right posterior 8th-10th ribs. The bones are osteopenic. Bibasilar atelectasis. No pneumothorax. Stable cardiac silhouette. IMPRESSION: 1. Minimally displaced fractures of the right posterior 8th-10th ribs. No pneumothorax. 2. Mildly displaced fracture of the lateral left clavicle. Electronically Signed   By: Angus Bark M.D.   On: 02/17/2024 15:28   CT HEAD WO CONTRAST Result Date: 02/15/2024 EXAM: CT HEAD WITHOUT 02/15/2024 07:55:13 PM TECHNIQUE: CT of the head was performed without the administration of intravenous contrast. Automated exposure control, iterative reconstruction, and/or weight based adjustment of the mA/kV was utilized to reduce the radiation dose to as low as reasonably achievable. COMPARISON: 12/17/2023 CLINICAL HISTORY: Altered mental status, nontraumatic (Ped 0-17y). FINDINGS: BRAIN AND VENTRICLES: There is no acute intracranial hemorrhage, mass effect or midline shift. No abnormal extra-axial fluid collection. The gray-white differentiation is maintained without evidence of an acute infarct. There is no evidence of hydrocephalus. ORBITS: The visualized portion of the orbits demonstrate no acute abnormality. SINUSES: The visualized paranasal sinuses and mastoid air cells demonstrate no acute abnormality. SOFT TISSUES AND SKULL: No acute abnormality of the visualized skull or soft tissues. IMPRESSION: 1. No acute intracranial abnormality. Electronically signed by: Zadie Herter MD 02/15/2024 08:07 PM EDT RP Workstation: [id number]   DG Chest Port 1 View Result Date: 02/15/2024 EXAM: 1 VIEW(S) XRAY OF THE CHEST 02/15/2024 07:41:00 PM COMPARISON: 12/14/2021 CLINICAL HISTORY: [id number] Pain [id number]. Reported had a fall the other day and brief episode of not remembering what happened. Has been alert and oriented with EMS. Complaining of some weakness and left rib pain. Coming from behavioral health in which she was there for [facility]. Last drink a couple of days ago. EKG normal.  Orthostatic. Stood and was dizzy. 500 NS 18 LAC. FINDINGS: LUNGS AND PLEURA: Mild left basilar scarring/atelectasis. No consolidation. No pulmonary edema. No pleural effusion. No pneumothorax. HEART AND MEDIASTINUM: No acute abnormality of the cardiac and mediastinal silhouettes. BONES AND SOFT TISSUES: No acute osseous abnormality. IMPRESSION: 1. No acute findings. 2. Mild left basilar scarring/atelectasis. Electronically signed by: Zadie Herter MD 02/15/2024 08:05 PM EDT RP Workstation: [id number]    Microbiology: Results for orders placed or performed in visit on 08/26/23  Fecal occult blood, imunochemical     Status: None   Collection Time: 08/26/23  1:44 PM   Specimen: Stool  Result Value Ref Range Status   Fecal Occult Bld Negative Negative Final    Labs: CBC: Recent Labs  Lab 02/20/24 0700  WBC 7.0  NEUTROABS 3.5  HGB 11.6*  HCT 34.8*  MCV 92.1  PLT 298   Basic Metabolic Panel: Recent Labs  Lab 02/20/24 0700 02/21/24 1007 02/22/24 1140 02/23/24 0622  NA 141  --   --   --  K 3.7 3.5 3.9  --   CL 100  --   --   --   CO2 29  --   --   --   GLUCOSE 108*  --   --   --   BUN 10  --   --   --   CREATININE 0.91  --   --  0.95  CALCIUM  9.6  --   --   --   MG  --  1.6* 2.1  --   PHOS  --  3.4 3.2  --    Liver Function Tests: No results for input(s): "AST", "ALT", "ALKPHOS", "BILITOT", "PROT", "ALBUMIN " in the last 168 hours. CBG: No results for input(s): "GLUCAP" in the last 168 hours.  Discharge time spent: 38 minutes.   Signed: Vianney Kopecky, MD [facility]

## 2024-03-01 ENCOUNTER — Telehealth (HOSPITAL_COMMUNITY): Payer: Self-pay

## 2024-03-01 NOTE — Telephone Encounter (Signed)
 Pt is requesting for Hydroxyzine  to be sent to pharmacy in [place] . States she is out of meds and out of time and would like to pick up meds.   [address], [place], [place] [address] Get Directions [phone] [facility] [phone] is open today until 9:00 PM  Phone number  [phone]

## 2024-03-01 NOTE — Telephone Encounter (Signed)
 Provider unable to refill patient's medication in [place] . Provider only able to refill medications in [place] .

## 2024-03-05 ENCOUNTER — Other Ambulatory Visit: Payer: Self-pay | Admitting: Nurse Practitioner

## 2024-03-05 DIAGNOSIS — K219 Gastro-esophageal reflux disease without esophagitis: Secondary | ICD-10-CM

## 2024-03-06 ENCOUNTER — Other Ambulatory Visit (HOSPITAL_COMMUNITY): Payer: Self-pay | Admitting: Physician Assistant

## 2024-03-06 DIAGNOSIS — F411 Generalized anxiety disorder: Secondary | ICD-10-CM

## 2024-03-08 ENCOUNTER — Inpatient Hospital Stay: Admitting: Nurse Practitioner

## 2024-03-08 ENCOUNTER — Other Ambulatory Visit (HOSPITAL_COMMUNITY): Payer: Self-pay | Admitting: Physician Assistant

## 2024-03-08 NOTE — Progress Notes (Deleted)
   Acute Office Visit  Subjective:     Patient ID: Carla Martinez, female    DOB: 1961-10-06, 63 y.o.   MRN: [id number]  No chief complaint on file.   HPI Patient is in today for ***  Patient was seen in the emergency department on 02/15/2024 for behavioral health concerns.  Patient went to behavioral for treatment alcohol withdrawal at that juncture they were concerned that patient seemed confused and sent her to the emergency department.  The ED she does admit to drinking small bottles of fireball about every hour and endorsed having had a fall 2 days previous.  They did do a CT of the head that was negative along with a chest x-ray that was negative for acute problems.  Patient was admitted for further management as she required a total of 195 mg of phenobarbital  to stop her alcohol withdrawals.  Patient was discharged on 02/23/2024 Patient did have a broken 8th through 10th right posterior ribs along with previous left clavicular fracture that was consulted with orthopedics and recommended sling.  Patient was IVC and psychiatry is on board and rescinded the IVC allowing patient to be discharged.  ROS      Objective:    There were no vitals taken for this visit. {Vitals History (Optional):23777}  Physical Exam  No results found for any visits on 03/08/24.      Assessment & Plan:   Problem List Items Addressed This Visit   None   No orders of the defined types were placed in this encounter.   No follow-ups on file.  Margarie Shay, NP

## 2024-03-09 ENCOUNTER — Ambulatory Visit (HOSPITAL_COMMUNITY)

## 2024-03-11 ENCOUNTER — Telehealth (INDEPENDENT_AMBULATORY_CARE_PROVIDER_SITE_OTHER): Admitting: Physician Assistant

## 2024-03-11 DIAGNOSIS — F1090 Alcohol use, unspecified, uncomplicated: Secondary | ICD-10-CM | POA: Diagnosis not present

## 2024-03-11 DIAGNOSIS — F411 Generalized anxiety disorder: Secondary | ICD-10-CM | POA: Diagnosis not present

## 2024-03-11 DIAGNOSIS — G4700 Insomnia, unspecified: Secondary | ICD-10-CM | POA: Diagnosis not present

## 2024-03-11 DIAGNOSIS — Z634 Disappearance and death of family member: Secondary | ICD-10-CM

## 2024-03-11 DIAGNOSIS — F339 Major depressive disorder, recurrent, unspecified: Secondary | ICD-10-CM

## 2024-03-14 ENCOUNTER — Encounter (HOSPITAL_COMMUNITY): Payer: Self-pay | Admitting: Physician Assistant

## 2024-03-14 MED ORDER — QUETIAPINE FUMARATE 400 MG PO TABS: 400.0000 mg | ORAL_TABLET | Freq: Every day | ORAL | 1 refills | Status: DC

## 2024-03-14 MED ORDER — GABAPENTIN 300 MG PO CAPS: 300.0000 mg | ORAL_CAPSULE | Freq: Three times a day (TID) | ORAL | 1 refills | Status: DC

## 2024-03-14 MED ORDER — FLUOXETINE HCL 40 MG PO CAPS: 40.0000 mg | ORAL_CAPSULE | Freq: Every day | ORAL | 1 refills | Status: DC

## 2024-03-14 MED ORDER — TRAZODONE HCL 100 MG PO TABS: 200.0000 mg | ORAL_TABLET | Freq: Every day | ORAL | 1 refills | Status: DC

## 2024-03-14 MED ORDER — ACAMPROSATE CALCIUM 333 MG PO TBEC: 666.0000 mg | DELAYED_RELEASE_TABLET | Freq: Three times a day (TID) | ORAL | 1 refills | Status: DC

## 2024-03-14 MED ORDER — QUETIAPINE FUMARATE 50 MG PO TABS: 50.0000 mg | ORAL_TABLET | Freq: Two times a day (BID) | ORAL | 1 refills | Status: DC

## 2024-03-14 MED ORDER — VIVITROL 380 MG IM SUSR: 380.0000 mg | INTRAMUSCULAR | 3 refills | Status: DC

## 2024-03-14 MED ORDER — HYDROXYZINE HCL 50 MG PO TABS: 50.0000 mg | ORAL_TABLET | Freq: Three times a day (TID) | ORAL | 1 refills | Status: DC | PRN
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
## 2024-03-15 ENCOUNTER — Ambulatory Visit (INDEPENDENT_AMBULATORY_CARE_PROVIDER_SITE_OTHER): Admitting: Nurse Practitioner

## 2024-03-15 VITALS — BP 106/68 | HR 65 | Temp 97.8°F | Ht 64.0 in | Wt 97.1 lb

## 2024-03-15 DIAGNOSIS — F1091 Alcohol use, unspecified, in remission: Secondary | ICD-10-CM

## 2024-03-15 DIAGNOSIS — Z79899 Other long term (current) drug therapy: Secondary | ICD-10-CM | POA: Insufficient documentation

## 2024-03-15 DIAGNOSIS — Z09 Encounter for follow-up examination after completed treatment for conditions other than malignant neoplasm: Secondary | ICD-10-CM | POA: Insufficient documentation

## 2024-03-15 LAB — COMPREHENSIVE METABOLIC PANEL WITH GFR
ALT: 14 U/L (ref 0–35)
AST: 20 U/L (ref 0–37)
Albumin: 3.9 g/dL (ref 3.5–5.2)
Alkaline Phosphatase: 98 U/L (ref 39–117)
BUN: 8 mg/dL (ref 6–23)
CO2: 35 meq/L — ABNORMAL HIGH (ref 19–32)
Calcium: 9.7 mg/dL (ref 8.4–10.5)
Chloride: 96 meq/L (ref 96–112)
Creatinine, Ser: 0.98 mg/dL (ref 0.40–1.20)
GFR: 62.76 mL/min (ref >60.00)
Glucose, Bld: 76 mg/dL (ref 70–99)
Potassium: 3.8 meq/L (ref 3.5–5.1)
Sodium: 138 meq/L (ref 135–145)
Total Bilirubin: 0.3 mg/dL (ref 0.2–1.2)
Total Protein: 6.5 g/dL (ref 6.0–8.3)

## 2024-03-15 LAB — CBC
HCT: 38.9 % (ref 36.0–46.0)
Hemoglobin: 12.7 g/dL (ref 12.0–15.0)
MCHC: 32.6 g/dL (ref 30.0–36.0)
MCV: 89.7 fl (ref 78.0–100.0)
Platelets: 333 10*3/uL (ref 150.0–400.0)
RBC: 4.33 Mil/uL (ref 3.87–5.11)
RDW: 14 % (ref 11.5–15.5)
WBC: 6.5 10*3/uL (ref 4.0–10.5)

## 2024-03-15 NOTE — Assessment & Plan Note (Signed)
 Patient currently maintained on Vivitrol  and Campral .  Has been sober for over a month.  She is following with behavioral health.  Continue patient was given praise and regards to her sobriety
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
## 2024-03-15 NOTE — Patient Instructions (Signed)
 Nice to see you today I will be in touch with the labs Follow up with me in 1 month

## 2024-03-15 NOTE — Assessment & Plan Note (Signed)
 Patient on several was QT Prolonging medications.  Psychiatry requests repeat EKG.  Done today office patient's.  Patient's corrected QT was 482 today

## 2024-03-16 ENCOUNTER — Encounter: Payer: Self-pay | Admitting: Nurse Practitioner

## 2024-03-17 ENCOUNTER — Ambulatory Visit: Payer: Self-pay | Admitting: Nurse Practitioner

## 2024-03-17 ENCOUNTER — Ambulatory Visit (INDEPENDENT_AMBULATORY_CARE_PROVIDER_SITE_OTHER)

## 2024-03-17 VITALS — BP 111/78 | HR 76 | Temp 98.0°F | Ht 64.0 in | Wt 96.4 lb

## 2024-03-17 DIAGNOSIS — F322 Major depressive disorder, single episode, severe without psychotic features: Secondary | ICD-10-CM | POA: Diagnosis not present

## 2024-03-17 MED ORDER — NALTREXONE 380 MG IM SUSR
380.0000 mg | Freq: Once | INTRAMUSCULAR | Status: AC
  Administered 2024-03-17: 380 mg via INTRAMUSCULAR

## 2024-03-17 NOTE — Progress Notes (Cosign Needed)
 Pt tolerated injection of Vivitrol  380mg  in Left upper outer quadrant with no complaints.   JNL, CMA   Pt states she is doing well despite surgeries in her hip that has been causing her a lot of pain. Pt states that she is 1 month free from drinking which I congratulated patient on. Patient states that she is a mother of 6 and brings her own injection.

## 2024-03-23 ENCOUNTER — Telehealth (HOSPITAL_COMMUNITY): Payer: Self-pay | Admitting: *Deleted

## 2024-03-23 NOTE — Telephone Encounter (Signed)
 Patient called stating that she is unable to find her Campral , she has called different [facility] as well as [facility]. Gave patient the number to [place] as well as [place] to try to find the medication. She will return call but would like to know what can happen if she is not able to find a pharmacy to fill.

## 2024-03-24 ENCOUNTER — Other Ambulatory Visit (HOSPITAL_COMMUNITY): Payer: Self-pay | Admitting: Physician Assistant

## 2024-03-24 DIAGNOSIS — F411 Generalized anxiety disorder: Secondary | ICD-10-CM

## 2024-03-24 NOTE — Telephone Encounter (Signed)
 Patient's latest Campral  prescription was sent to [facility] [address], [place] - [address] on 03/14/2024.

## 2024-03-31 ENCOUNTER — Telehealth (HOSPITAL_COMMUNITY): Payer: Self-pay | Admitting: Physician Assistant

## 2024-03-31 NOTE — Telephone Encounter (Signed)
 [facility] sent a request for patients hydroxyzine . Reveiwed record, she has a refill that should last her till 05/10/24 and should not be out and also she has an appt with her provider on 6/26 which is well in advance of her running out of her med. Confirmed with pharmacy she filled on 5/26 and has meds till her next appt with the provider.

## 2024-04-07 ENCOUNTER — Other Ambulatory Visit: Payer: Self-pay | Admitting: Nurse Practitioner

## 2024-04-07 DIAGNOSIS — K219 Gastro-esophageal reflux disease without esophagitis: Secondary | ICD-10-CM

## 2024-04-14 ENCOUNTER — Ambulatory Visit (HOSPITAL_COMMUNITY)

## 2024-04-14 ENCOUNTER — Telehealth (HOSPITAL_COMMUNITY): Payer: Self-pay

## 2024-04-14 NOTE — Telephone Encounter (Signed)
 pt had came into the office today to get injection but did not have the injection with her. she was advised to go to pharmacy to pick up injection and come back or r/s appt to come back for injection. pt called and left a message that the pharmacy did not the injections.

## 2024-04-14 NOTE — Telephone Encounter (Signed)
 called pharmacy because pt should have refills on the medication vivitrol . pharmaist states that pt did not request that medication when she came in. i asked if he could refill he stated that they will get ready now and that she had 2 more refills after this one.

## 2024-04-22 ENCOUNTER — Other Ambulatory Visit (HOSPITAL_COMMUNITY): Payer: Self-pay | Admitting: Physician Assistant

## 2024-04-22 DIAGNOSIS — F411 Generalized anxiety disorder: Secondary | ICD-10-CM

## 2024-04-25 ENCOUNTER — Telehealth: Payer: Self-pay
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
## 2024-04-27 NOTE — Telephone Encounter (Signed)
 Message acknowledged and reviewed.

## 2024-04-28 ENCOUNTER — Ambulatory Visit: Admitting: Nurse Practitioner

## 2024-04-28 VITALS — BP 90/58 | HR 75 | Temp 98.1°F | Ht 64.0 in | Wt 96.0 lb

## 2024-04-28 DIAGNOSIS — H00011 Hordeolum externum right upper eyelid: Secondary | ICD-10-CM

## 2024-04-28 DIAGNOSIS — S2241XD Multiple fractures of ribs, right side, subsequent encounter for fracture with routine healing: Secondary | ICD-10-CM

## 2024-04-28 DIAGNOSIS — Z09 Encounter for follow-up examination after completed treatment for conditions other than malignant neoplasm: Secondary | ICD-10-CM

## 2024-04-28 MED ORDER — ERYTHROMYCIN 5 MG/GM OP OINT: 1.0000 | TOPICAL_OINTMENT | Freq: Three times a day (TID) | OPHTHALMIC | 0 refills | Status: AC

## 2024-04-28 NOTE — Assessment & Plan Note (Signed)
 Patient still having some pain.  Encouraged sleep breathing exercises with incentive spirometry.  Bracing with increased intra-abdominal pressure.  Continue the oxycodone  as prescribed in the hospital

## 2024-04-28 NOTE — Assessment & Plan Note (Signed)
 Reviewed ED note along with inpatient notes and consults.  Reviewed labs and imaging

## 2024-04-28 NOTE — Addendum Note (Signed)
 Addended by: WENDEE LYNWOOD HERO on: 04/28/2024 01:21 PM   Modules accepted: Orders

## 2024-04-28 NOTE — Patient Instructions (Signed)
 Nice to see you today  I want to see you in a month to check on your pian  Try using the incentive spirometer every hour and work on deep breathing exercises  If you develop a cough/shortness of breath/fever please let me know
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
## 2024-05-03 ENCOUNTER — Other Ambulatory Visit: Payer: Self-pay
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
## 2024-05-06 ENCOUNTER — Other Ambulatory Visit: Payer: Self-pay | Admitting: Nurse Practitioner

## 2024-05-06 DIAGNOSIS — K219 Gastro-esophageal reflux disease without esophagitis: Secondary | ICD-10-CM

## 2024-05-11 ENCOUNTER — Telehealth: Payer: Self-pay

## 2024-05-12 ENCOUNTER — Telehealth: Payer: Self-pay

## 2024-05-12 NOTE — Transitions of Care (Post Inpatient/ED Visit) (Signed)
 Care Management  Transitions of Care Program Transitions of Care Post-discharge week 3  05/12/2024 Name: Carla Martinez MRN: [id number] DOB: [birth year]  Subjective: Carla Martinez is a 63 y.o. year old female who is a primary care patient of Wendee Lynwood HERO, NP. The Care Management team was unable to reach the patient by phone to assess and address transitions of care needs.   Plan: Additional outreach attempts will be made to reach the patient enrolled in the [facility] Program (Post Inpatient/ED Visit).  Richerd Fish, RN, BSN, CCM [facility], [facility] Health RN Care Manager Direct Dial: [phone]

## 2024-05-12 NOTE — Transitions of Care (Post Inpatient/ED Visit) (Deleted)
   05/12/2024  Name: Carla Martinez MRN: [id number] DOB: 1961-04-09  Today's TOC FU Call Status:    Attempted to reach the patient regarding the most recent Inpatient/ED visit.  Follow Up Plan: Additional outreach attempts will be made to reach the patient to complete the Transitions of Care (Post Inpatient/ED visit) call.   Richerd Fish, RN, BSN, CCM [facility], [facility] Health RN Care Manager Direct Dial: [phone]

## 2024-05-13 ENCOUNTER — Telehealth: Payer: Self-pay

## 2024-05-13 NOTE — Transitions of Care (Post Inpatient/ED Visit) (Signed)
 Care Management  Transitions of Care Program Transitions of Care Post-discharge week 4  05/13/2024 Name: Carla Martinez MRN: [id number] DOB: [birth year]  Subjective: Carla Martinez is a 63 y.o. year old female who is a primary care patient of Wendee Lynwood HERO, NP. The Care Management team was unable to reach the patient by phone to assess and address transitions of care needs.   Plan: No further outreach attempts will be made at this time.  We have been unable to reach the patient.  Richerd Fish, RN, BSN, CCM [facility], [facility] Health RN Care Manager Direct Dial: [phone]

## 2024-05-17 ENCOUNTER — Other Ambulatory Visit (HOSPITAL_COMMUNITY): Payer: Self-pay | Admitting: Physician Assistant

## 2024-05-17 DIAGNOSIS — F411 Generalized anxiety disorder: Secondary | ICD-10-CM

## 2024-05-21 ENCOUNTER — Other Ambulatory Visit (HOSPITAL_COMMUNITY): Payer: Self-pay | Admitting: Physician Assistant

## 2024-05-21 DIAGNOSIS — F411 Generalized anxiety disorder: Secondary | ICD-10-CM

## 2024-05-26 ENCOUNTER — Telehealth (INDEPENDENT_AMBULATORY_CARE_PROVIDER_SITE_OTHER): Admitting: Physician Assistant

## 2024-05-26 ENCOUNTER — Ambulatory Visit (INDEPENDENT_AMBULATORY_CARE_PROVIDER_SITE_OTHER)

## 2024-05-26 ENCOUNTER — Encounter (HOSPITAL_COMMUNITY): Payer: Self-pay | Admitting: Physician Assistant

## 2024-05-26 VITALS — BP 101/66 | HR 70 | Ht 64.0 in | Wt 98.0 lb

## 2024-05-26 DIAGNOSIS — F10221 Alcohol dependence with intoxication delirium: Secondary | ICD-10-CM | POA: Diagnosis not present

## 2024-05-26 DIAGNOSIS — F411 Generalized anxiety disorder: Secondary | ICD-10-CM | POA: Diagnosis not present

## 2024-05-26 DIAGNOSIS — F339 Major depressive disorder, recurrent, unspecified: Secondary | ICD-10-CM

## 2024-05-26 DIAGNOSIS — F322 Major depressive disorder, single episode, severe without psychotic features: Secondary | ICD-10-CM | POA: Diagnosis not present

## 2024-05-26 DIAGNOSIS — F1090 Alcohol use, unspecified, uncomplicated: Secondary | ICD-10-CM

## 2024-05-26 DIAGNOSIS — G4700 Insomnia, unspecified: Secondary | ICD-10-CM | POA: Diagnosis not present

## 2024-05-26 DIAGNOSIS — Z634 Disappearance and death of family member: Secondary | ICD-10-CM

## 2024-05-26 MED ORDER — NALTREXONE 380 MG IM SUSR
380.0000 mg | Freq: Once | INTRAMUSCULAR | Status: AC
  Administered 2024-05-26: 380 mg via INTRAMUSCULAR

## 2024-05-26 MED ORDER — GABAPENTIN 300 MG PO CAPS: 300.0000 mg | ORAL_CAPSULE | Freq: Three times a day (TID) | ORAL | 1 refills | Status: DC

## 2024-05-26 MED ORDER — HYDROXYZINE HCL 50 MG PO TABS: 50.0000 mg | ORAL_TABLET | Freq: Three times a day (TID) | ORAL | 1 refills | Status: DC | PRN

## 2024-05-26 MED ORDER — QUETIAPINE FUMARATE 50 MG PO TABS: 50.0000 mg | ORAL_TABLET | Freq: Two times a day (BID) | ORAL | 1 refills | Status: DC

## 2024-05-26 MED ORDER — ACAMPROSATE CALCIUM 333 MG PO TBEC: 666.0000 mg | DELAYED_RELEASE_TABLET | Freq: Three times a day (TID) | ORAL | 1 refills | Status: DC

## 2024-05-26 MED ORDER — TRAZODONE HCL 300 MG PO TABS: 300.0000 mg | ORAL_TABLET | Freq: Every day | ORAL | 1 refills | Status: DC

## 2024-05-26 MED ORDER — QUETIAPINE FUMARATE 400 MG PO TABS
400.0000 mg | ORAL_TABLET | Freq: Every day | ORAL | 1 refills | Status: DC
Start: 2024-05-26 — End: 2024-07-13

## 2024-05-26 MED ORDER — FLUOXETINE HCL 40 MG PO CAPS: 40.0000 mg | ORAL_CAPSULE | Freq: Every day | ORAL | 1 refills | Status: DC
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
## 2024-05-26 NOTE — Progress Notes (Cosign Needed)
 Pt tolerated injection of Vivitrol  380mg  in Right upper outer quadrant with no complaints.    JNL, CMA     Pt states she is doing well despite surgeries in her hip that has been causing her a lot of pain. Pt states that she is 1 month free from drinking which I congratulated patient on. Patient states that she is a mother of 6 and brings her own injection. Pt does state that she does not know if medication Is actually working. Pt expresses discomfort of injection stating that this has left knots in her glute area. Talked to her provider and Drug rep. Drug rep states that Pt might not be a good ft for injection due to the pain it is causing her and due to low fat deposit.

## 2024-05-31 ENCOUNTER — Other Ambulatory Visit: Payer: Self-pay | Admitting: Nurse Practitioner

## 2024-05-31 DIAGNOSIS — K219 Gastro-esophageal reflux disease without esophagitis: Secondary | ICD-10-CM

## 2024-06-01 ENCOUNTER — Ambulatory Visit: Admitting: Nurse Practitioner

## 2024-06-03 ENCOUNTER — Ambulatory Visit: Admitting: Nurse Practitioner

## 2024-06-06 ENCOUNTER — Telehealth (HOSPITAL_COMMUNITY): Payer: Self-pay | Admitting: Licensed Clinical Social Worker

## 2024-06-06 NOTE — Telephone Encounter (Signed)
 The therapist attempts to reach [facility] in relation to a SA IOP referral received from her Psych PA-C. The therapist leaves a HIPAA-compliant voicemail with his direct contact number.  [name], MA, LCSW, [facility], LCAS 06/06/2024

## 2024-06-07 ENCOUNTER — Telehealth (HOSPITAL_COMMUNITY): Payer: Self-pay

## 2024-06-07 NOTE — Telephone Encounter (Signed)
 Carla Martinez calls and leaves a message that she is returning a call made to her earlier in the day on 06-06-24.  She leaves her call back number as [phone].   This therapist returns Carla Martinez's call and reaches voice mail. Therapist leaves a HIPAA compliant message requesting a return call.  Carla Simpler, MS, LMFT, LCAS

## 2024-06-19 ENCOUNTER — Other Ambulatory Visit: Payer: Self-pay | Admitting: Nurse Practitioner

## 2024-06-19 ENCOUNTER — Other Ambulatory Visit (HOSPITAL_COMMUNITY): Payer: Self-pay | Admitting: Physician Assistant

## 2024-06-19 DIAGNOSIS — K219 Gastro-esophageal reflux disease without esophagitis: Secondary | ICD-10-CM

## 2024-06-19 DIAGNOSIS — F411 Generalized anxiety disorder: Secondary | ICD-10-CM

## 2024-06-19 DIAGNOSIS — F339 Major depressive disorder, recurrent, unspecified: Secondary | ICD-10-CM

## 2024-06-21 ENCOUNTER — Ambulatory Visit (INDEPENDENT_AMBULATORY_CARE_PROVIDER_SITE_OTHER)
Admission: RE | Admit: 2024-06-21 | Discharge: 2024-06-21 | Disposition: A | Discharge status: Home / Self Care | Source: Ambulatory Visit | Attending: Nurse Practitioner | Admitting: Nurse Practitioner

## 2024-06-21 ENCOUNTER — Ambulatory Visit (INDEPENDENT_AMBULATORY_CARE_PROVIDER_SITE_OTHER): Admitting: Nurse Practitioner

## 2024-06-21 VITALS — BP 116/62 | HR 70 | Temp 98.0°F | Ht 64.0 in | Wt 102.0 lb

## 2024-06-21 DIAGNOSIS — R002 Palpitations: Secondary | ICD-10-CM | POA: Diagnosis not present

## 2024-06-21 DIAGNOSIS — R0602 Shortness of breath: Secondary | ICD-10-CM | POA: Diagnosis not present

## 2024-06-21 DIAGNOSIS — R600 Localized edema: Secondary | ICD-10-CM

## 2024-06-21 LAB — BASIC METABOLIC PANEL WITH GFR
BUN: 11 mg/dL (ref 6–23)
CO2: 28 meq/L (ref 19–32)
Calcium: 8.8 mg/dL (ref 8.4–10.5)
Chloride: 104 meq/L (ref 96–112)
Creatinine, Ser: 1.01 mg/dL (ref 0.40–1.20)
GFR: 60.41 mL/min (ref >60.00)
Glucose, Bld: 65 mg/dL — ABNORMAL LOW (ref 70–99)
Potassium: 3.1 meq/L — ABNORMAL LOW (ref 3.5–5.1)
Sodium: 142 meq/L (ref 135–145)

## 2024-06-21 LAB — CBC
HCT: 34.7 % — ABNORMAL LOW (ref 36.0–46.0)
Hemoglobin: 11.3 g/dL — ABNORMAL LOW (ref 12.0–15.0)
MCHC: 32.5 g/dL (ref 30.0–36.0)
MCV: 89.8 fl (ref 78.0–100.0)
Platelets: 312 K/uL (ref 150.0–400.0)
RBC: 3.86 Mil/uL — ABNORMAL LOW (ref 3.87–5.11)
RDW: 15.8 % — ABNORMAL HIGH (ref 11.5–15.5)
WBC: 6.3 K/uL (ref 4.0–10.5)

## 2024-06-21 LAB — BRAIN NATRIURETIC PEPTIDE: Pro B Natriuretic peptide (BNP): 216 pg/mL — ABNORMAL HIGH (ref 0.0–100.0)

## 2024-06-21 LAB — TSH: TSH: 0.91 u[IU]/mL (ref 0.35–5.50)

## 2024-06-21 NOTE — Patient Instructions (Addendum)
 Nice to see you today I will be in touch with the labs and xrays Follow up with me as needed EKG was ok in office   Dr. Darliss Address: [id number], [place], [place] [address] Open ? Closes 5?PM Phone: [phone]
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
## 2024-06-22 LAB — D-DIMER, QUANTITATIVE: D-Dimer, Quant: 0.41 ug{FEU}/mL (ref <0.50)

## 2024-06-23 ENCOUNTER — Telehealth (HOSPITAL_COMMUNITY): Payer: Self-pay

## 2024-06-23 ENCOUNTER — Encounter: Payer: Self-pay | Admitting: Nurse Practitioner

## 2024-06-23 ENCOUNTER — Ambulatory Visit: Payer: Self-pay | Admitting: Nurse Practitioner

## 2024-06-23 DIAGNOSIS — R911 Solitary pulmonary nodule: Secondary | ICD-10-CM

## 2024-06-23 DIAGNOSIS — E876 Hypokalemia: Secondary | ICD-10-CM

## 2024-06-23 DIAGNOSIS — J90 Pleural effusion, not elsewhere classified: Secondary | ICD-10-CM

## 2024-06-23 DIAGNOSIS — R7989 Other specified abnormal findings of blood chemistry: Secondary | ICD-10-CM

## 2024-06-23 MED ORDER — POTASSIUM CHLORIDE CRYS ER 20 MEQ PO TBCR
20.0000 meq | EXTENDED_RELEASE_TABLET | Freq: Every day | ORAL | 0 refills | Status: DC
Start: 2024-06-23 — End: 2024-07-06

## 2024-06-23 MED ORDER — FUROSEMIDE 20 MG PO TABS: 20.0000 mg | ORAL_TABLET | Freq: Every day | ORAL | 0 refills | Status: DC

## 2024-06-23 NOTE — Telephone Encounter (Signed)
STAT CT scan placed.

## 2024-06-23 NOTE — Telephone Encounter (Signed)
-----   Message from [facility] sent at 06/23/2024  3:53 PM EDT ----- Called pt.  She prefers [place] location for CT.  Pt is scheduled for non fasting lab visit in 1 week. 06/30/24 @12pm  ----- Message ----- From: Wendee Lynwood HERO, NP Sent: 06/23/2024   7:26 AM EDT To: Wendee Gunnels  Notified via My Chart   Need 1 week lab appt, non fasting. Where does she want to get the CT done at  ----- Message ----- From: Interface, Lab In Three Zero One Sent: 06/21/2024   4:08 PM EDT To: Lynwood HERO Wendee, NP

## 2024-06-23 NOTE — Telephone Encounter (Signed)
 [name] calls and leaves a message requesting a return call. Front desk also sends an in [employer] saying this pt called and wanted assistance with substance use.  This therapist calls and reached voice mail. Therapist leaves a HIPAA compliant voice mail requesting a return call.  [name], MS, LMFT, LCAS 06-23-24

## 2024-06-24 ENCOUNTER — Telehealth (HOSPITAL_COMMUNITY): Payer: Self-pay

## 2024-06-24 NOTE — Telephone Encounter (Signed)
 Carla Martinez calls and leaves a message saying she needs to get an alcohol assessment and asks for a return call. This therapist returns the call and reaches voice mail. Therapist leaves a HIPAA compliant message requesting a return call.  Carla Martinez calls right back and says she needs an alcohol assessment. Therapist inquires as to what she needs it for and she says it is for a DWI. Therapist explains providers have to have a certain credential to do this type of assessment and therapist could email her the list. The connection was quite poor and therapist could not make out what she was saying. Carla Martinez says her connection is awful. Therapist provides her email and Carla Martinez emails this therapist. Therapist sends her a list of DWI assessment providers.  [name], MS, LMFT, LCAS

## 2024-06-28 ENCOUNTER — Ambulatory Visit: Attending: Cardiology | Admitting: Cardiology

## 2024-06-28 ENCOUNTER — Encounter: Payer: Self-pay | Admitting: Cardiology

## 2024-06-28 ENCOUNTER — Ambulatory Visit

## 2024-06-28 ENCOUNTER — Other Ambulatory Visit (INDEPENDENT_AMBULATORY_CARE_PROVIDER_SITE_OTHER)

## 2024-06-28 VITALS — BP 104/60 | HR 78 | Ht 64.0 in | Wt 100.0 lb

## 2024-06-28 DIAGNOSIS — R072 Precordial pain: Secondary | ICD-10-CM

## 2024-06-28 DIAGNOSIS — I25118 Atherosclerotic heart disease of native coronary artery with other forms of angina pectoris: Secondary | ICD-10-CM

## 2024-06-28 DIAGNOSIS — R0602 Shortness of breath: Secondary | ICD-10-CM | POA: Diagnosis not present

## 2024-06-28 DIAGNOSIS — R7989 Other specified abnormal findings of blood chemistry: Secondary | ICD-10-CM

## 2024-06-28 DIAGNOSIS — J90 Pleural effusion, not elsewhere classified: Secondary | ICD-10-CM | POA: Diagnosis not present

## 2024-06-28 DIAGNOSIS — E876 Hypokalemia: Secondary | ICD-10-CM

## 2024-06-28 DIAGNOSIS — R002 Palpitations: Secondary | ICD-10-CM

## 2024-06-28 DIAGNOSIS — I5181 Takotsubo syndrome: Secondary | ICD-10-CM

## 2024-06-28 DIAGNOSIS — R600 Localized edema: Secondary | ICD-10-CM

## 2024-06-28 DIAGNOSIS — I5022 Chronic systolic (congestive) heart failure: Secondary | ICD-10-CM

## 2024-06-28 LAB — BASIC METABOLIC PANEL WITH GFR
BUN: 10 mg/dL (ref 6–23)
CO2: 31 meq/L (ref 19–32)
Calcium: 9.5 mg/dL (ref 8.4–10.5)
Chloride: 101 meq/L (ref 96–112)
Creatinine, Ser: 1.05 mg/dL (ref 0.40–1.20)
GFR: 57.65 mL/min — ABNORMAL LOW (ref >60.00)
Glucose, Bld: 80 mg/dL (ref 70–99)
Potassium: 3.7 meq/L (ref 3.5–5.1)
Sodium: 140 meq/L (ref 135–145)

## 2024-06-28 LAB — BRAIN NATRIURETIC PEPTIDE: Pro B Natriuretic peptide (BNP): 57 pg/mL (ref 0.0–100.0)

## 2024-06-28 NOTE — Progress Notes (Signed)
 Cardiology Office Note   Date:  06/28/2024  ID:  Renai, [place] [birth year], MRN [id number] PCP: Wendee Lynwood HERO, NP  [facility] Providers Cardiologist:  Redell Cave, MD Cardiology APP:  Gerard Frederick, NP     History of Present Illness Carla Martinez is a 63 y.o. female with a past medical history of nonobstructive coronary artery disease, stress-induced cardiomyopathy, polysubstance abuse, GERD, anxiety, former smoker x 16 years, who is here today for follow-up.   Patient was previously evaluated by [facility] cardiology with cardiac perspective.  Presented to the hospital 06/2022 with symptoms of chest pain.  Echocardiogram showed severely reduced ejection fraction, mid to apical hypokinesis.  Left heart catheterization showed mild nonobstructive CAD, 25% proximal RCA and left circumflex disease.  Patient diagnosed with Takotsubo cardiomyopathy.  She had a brief episode of tachycardia during that admission with heart rate of 154 bpm with spontaneous conversion to sinus rhythm.  EKG was read as paroxysmal atrial fibrillation.  She was eventually started on Toprol -XL which took for some time and then eventually self discontinued.   She was last seen in clinic April 2024 by Dr. Cave.  She denies any chest pain or shortness of breath at that time.  She was scheduled for repeat echocardiogram and was to restart Toprol -XL 25 mg daily.  She returns to clinic today with a list of complaints.  She states that she has been suffering from worsening shortness of breath, peripheral edema, palpitations, chest heaviness and tightness.  She states that recently her primary care provider did labs and placed her on furosemide  20 mg daily for 10 days which helped with the swelling that she had to her bilateral lower extremities.  She states she was also told that she had an abnormal EKG that needed further evaluation.  Since being on furosemide  the swelling has improved in her lower  extremities.  She continues to have shortness of breath and chest heaviness and tightness.  Denies any hospitalizations or visits to the emergency department.  As patient by previous echocardiogram with recommendation to start Toprol -XL and losartan  with referral to advanced heart failure clinic.  Patient states that she is never advised to restart any medications start new medications or had a referral to advanced heart failure clinic.  ROS: 10 point review of systems has been reviewed and considered negative the exception was been listed in the HPI  Studies Reviewed     2D echo 03/18/2023 1. Left ventricular ejection fraction, by estimation, is 40 to 45%. The  left ventricle has mild to moderately decreased function. The left  ventricle demonstrates global hypokinesis. Left ventricular diastolic  parameters were normal.   2. Right ventricular systolic function is low normal. The right  ventricular size is normal.   3. 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   4. The aortic valve is tricuspid. Aortic valve regurgitation is not  visualized.   5. The inferior vena cava is normal in size with greater than 50%  respiratory variability, suggesting right atrial pressure of 3 mmHg.   LHC 07/05/2021   Prox RCA to Mid RCA lesion is 25% stenosed.   Ost Cx to Prox Cx lesion is 25% stenosed.   LV end diastolic pressure is moderately elevated.   There is no aortic valve stenosis.   Conclusion Mild non-obstructive coronary artery disease.  Moderately elevated LVEDP of 22 mmHg, with systemic hypotension.  Recommendations: Medical management of Stress cardiomyopathy (Takotsubo). Initiation of beta blocker and ACE/ARB as BP allows.  Aspirin  81 mg indefinitely Aggressive secondary prevention  2D echo 07/03/2021 1. Left ventricular ejection fraction, by estimation, is 25 to 30%. The  left ventricle  has severely decreased function. The left ventricle  demonstrates regional wall motion abnormalities (see scoring  diagram/findings for description). The left  ventricular internal cavity size was mildly to moderately dilated. Left  ventricular diastolic parameters were normal.   2. Right ventricular systolic function is normal. The right ventricular  size is normal.   3. Left atrial size was mildly dilated.   4. The mitral valve is normal in structure. Mild mitral valve  regurgitation.   5. The aortic valve is normal in structure. Aortic valve regurgitation is  trivial.   Risk Assessment/Calculations           Physical Exam VS:  BP 104/60 (BP Location: Left Arm, Patient Position: Sitting, Cuff Size: Normal)   Pulse 78   Ht 5' 4 (1.626 m)   Wt 100 lb (45.4 kg)   SpO2 99%   BMI 17.16 kg/m        Wt Readings from Last 3 Encounters:  06/28/24 100 lb (45.4 kg)  06/21/24 102 lb (46.3 kg)  04/28/24 96 lb (43.5 kg)    GEN: Well nourished, well developed in no acute distress NECK: No JVD; No carotid bruits CARDIAC: RRR, no murmurs, rubs, gallops RESPIRATORY:  Clear with diminished bases to auscultation without rales, wheezing or rhonchi  ABDOMEN: Soft, non-tender, non-distended EXTREMITIES:  No edema; No deformity   ASSESSMENT AND PLAN HFmrEF/Stress-induced cardiomyopathy with no significant CAD on left heart cath and stress-induced/Takotsubo cardiomyopathy on prior echo from 2022.  Repeat echocardiogram was ordered that revealed an LV of 40-45%.  It was recommended at that time that she start GDMT with Toprol -XL and losartan  with a referral to advanced heart failure clinic.  Unfortunately patient states that she never received this medications and referral was never completed.  With her ongoing complaints of shortness of breath peripheral edema and elevated BNP by her PCP and history of cardiomyopathy she has been scheduled for an updated echocardiogram.  She is continued on  furosemide  20 mg daily with potassium supplementation.  With recently being started on furosemide  it was noted potassium was 3.1.  She is having labs done today to reevaluate potassium if potassium continues to remain low she would benefit from MRA therapy.  Will continue to discuss and try to escalate GDMT as tolerated and if need be send referral after echocardiogram back to advanced heart failure clinic.  Nonobstructive coronary artery disease without significant CAD on prior heart catheterization.  With nonobstructive disease she would benefit from aspirin  81 mg daily.  Last LDL was 89 with a goal of 70 or less.  Additional medication changes were deferred at this time until testing is completed.  With chest discomfort and chest pressure that she has been suffering from she has been scheduled for Lexiscan Myoview review to rule out any ischemic causes.  Palpitations with the reduction in caffeine intake.  Previously was on beta-blocker therapy but discontinued due to side effects.  She has been placed on a ZIO XT monitor for 14 days to rule out arrhythmia.  Peripheral edema to the bilateral lower extremities she states it was above the knees.  That is resolved with being started on diuretic therapy by her PCP.  She has been continued on her current medication regimen  at this time with repeat lab/being sent today.  Hypokalemia with last serum potassium 3.1.  She was continued on potassium supplements with repeat labs being ordered today.    Informed Consent   Shared Decision Making/Informed Consent The risks [chest pain, shortness of breath, cardiac arrhythmias, dizziness, blood pressure fluctuations, myocardial infarction, stroke/transient ischemic attack, nausea, vomiting, allergic reaction, radiation exposure, metallic taste sensation and life-threatening complications (estimated to be 1 in 10,000)], benefits (risk stratification, diagnosing coronary artery disease, treatment guidance) and  alternatives of a nuclear stress test were discussed in detail with Ms. Liwanag and she agrees to proceed.     Dispo: Patient to return to clinic to see MD/APP in 6 weeks or sooner if needed for further evaluation after testing is completed.  Signed, Rhylen Shaheen, NP

## 2024-06-28 NOTE — Patient Instructions (Signed)
 Medication Instructions:  Your physician recommends that you continue on your current medications as directed. Please refer to the Current Medication list given to you today.   *If you need a refill on your cardiac medications before your next appointment, please call your pharmacy*  Lab Work: Your provider would like for you to have following labs drawn today BMP, BNP.   If you have labs (blood work) drawn today and your tests are completely normal, you will receive your results only by: MyChart Message (if you have MyChart) OR A paper copy in the mail If you have any lab test that is abnormal or we need to change your treatment, we will call you to review the results.  Testing/Procedures: Your physician has requested that you have an echocardiogram. Echocardiography is a painless test that uses sound waves to create images of your heart. It provides your doctor with information about the size and shape of your heart and how well your heart's chambers and valves are working.   You may receive an ultrasound enhancing agent through an IV if needed to better visualize your heart during the echo. This procedure takes approximately one hour.  There are no restrictions for this procedure.  This will take place at [address] [facility], [place] [address]  Please note: We ask at that you not bring children with you during ultrasound (echo/ vascular) testing. Due to room size and safety concerns, children are not allowed in the ultrasound rooms during exams. Our front office staff cannot provide observation of children in our lobby area while testing is being conducted. An adult accompanying a patient to their appointment will only be allowed in the ultrasound room at the discretion of the ultrasound technician under special circumstances. We apologize for any inconvenience.   ZIO XT- Long Term Monitor Instructions  Your physician has requested you wear a ZIO patch monitor  for 14 days.  This is a single patch monitor. [employer] supplies one patch monitor per enrollment. Additional stickers are not available. Please do not apply patch if you will be having a Nuclear Stress Test,  Echocardiogram, Cardiac CT, MRI, or Chest Xray during the period you would be wearing the  monitor. The patch cannot be worn during these tests. You cannot remove and re-apply the  ZIO XT patch monitor.  Your ZIO patch monitor will be mailed 3 day USPS to your address on file. It may take 3-5 days  to receive your monitor after you have been enrolled.  Once you have received your monitor, please review the enclosed instructions. Your monitor  has already been registered assigning a specific monitor serial # to you.  Billing and Patient Assistance Program Information  We have supplied [employer] with any of your insurance information on file for billing purposes. [employer] offers a sliding scale Patient Assistance Program for patients that do not have  insurance, or whose insurance does not completely cover the cost of the ZIO monitor.  You must apply for the Patient Assistance Program to qualify for this discounted rate.  To apply, please call [employer] at [phone], select option 4, select option 2, ask to apply for  Patient Assistance Program. Meredeth will ask your household income, and how many people  are in your household. They will quote your out-of-pocket cost based on that information.  [employer] will also be able to set up a 36-month, interest-free payment plan if needed.  Applying the monitor   Shave hair from upper left  chest.  Hold abrader disc by orange tab. Rub abrader in 40 strokes over the upper left chest as  indicated in your monitor instructions.  Clean area with 4 enclosed alcohol pads. Let dry.  Apply patch as indicated in monitor instructions. Patch will be placed under collarbone on left  side of chest with arrow pointing upward.  Rub patch adhesive wings for 2  minutes. Remove white label marked 1. Remove the white  label marked 2. Rub patch adhesive wings for 2 additional minutes.  While looking in a mirror, press and release button in center of patch. A small green light will  flash 3-4 times. This will be your only indicator that the monitor has been turned on.  Do not shower for the first 24 hours. You may shower after the first 24 hours.  Press the button if you feel a symptom. You will hear a small click. Record Date, Time and  Symptom in the Patient Logbook.  When you are ready to remove the patch, follow instructions on the last 2 pages of Patient  Logbook. Stick patch monitor onto the last page of Patient Logbook.  Place Patient Logbook in the blue and white box. Use locking tab on box and tape box closed  securely. The blue and white box has prepaid postage on it. Please place it in the mailbox as  soon as possible. Your physician should have your test results approximately 7 days after the  monitor has been mailed back to [facility].  Call [facility] Customer Care at [phone] if you have questions regarding  your ZIO XT patch monitor. Call them immediately if you see an orange light blinking on your  monitor.  If your monitor falls off in less than 4 days, contact our Monitor department at [phone].  If your monitor becomes loose or falls off after 4 days call [employer] at [phone] for  suggestions on securing your monitor   Your provider has ordered a Lexiscan/ Exercise Myoview Stress test. This will take place at [facility]. Please report to the [facility] medical mall entrance. The volunteers at the first desk will direct you where to go.  [facility] MYOVIEW  Your provider has ordered a Stress Test with nuclear imaging. The purpose of this test is to evaluate the blood supply to your heart muscle. This procedure is referred to as a Non-Invasive Stress Test. This is because other than having an IV started in your vein, nothing  is inserted or invades your body. Cardiac stress tests are done to find areas of poor blood flow to the heart by determining the extent of coronary artery disease (CAD). Some patients exercise on a treadmill, which naturally increases the blood flow to your heart, while others who are unable to walk on a treadmill due to physical limitations will have a pharmacologic/chemical stress agent called Lexiscan . This medicine will mimic walking on a treadmill by temporarily increasing your coronary blood flow.   Please note: these test may take anywhere between 2-4 hours to complete  How to prepare for your Myoview test:  Nothing to eat for 6 hours prior to the test No caffeine for 24 hours prior to test No smoking 24 hours prior to test. Your medication may be taken with water.  If your doctor stopped a medication because of this test, do not take that medication. Ladies, please do not wear dresses.  Skirts or pants are appropriate. Please wear a short sleeve shirt. No perfume, cologne or lotion. Wear  comfortable walking shoes. No heels!   PLEASE NOTIFY THE OFFICE AT LEAST 24 HOURS IN ADVANCE IF YOU ARE UNABLE TO KEEP YOUR APPOINTMENT.  [phone] AND  PLEASE NOTIFY NUCLEAR MEDICINE AT [facility] AT LEAST 24 HOURS IN ADVANCE IF YOU ARE UNABLE TO KEEP YOUR APPOINTMENT. [phone]   Follow-Up: At [facility], you and your health needs are our priority.  As part of our continuing mission to provide you with exceptional heart care, our providers are all part of one team.  This team includes your primary Cardiologist (physician) and Advanced Practice Providers or APPs (Physician Assistants and Nurse Practitioners) who all work together to provide you with the care you need, when you need it.  Your next appointment:   2 month(s)  Provider:   You may see Redell Cave, MD or one of the following Advanced Practice Providers on your designated Care Team:   Lonni Meager, NP Lesley Maffucci, PA-C Bernardino Bring, PA-C Cadence Havelock, PA-C Tylene Lunch, NP Barnie Hila, NP

## 2024-06-29 NOTE — Telephone Encounter (Signed)
 Copied from CRM [id number]. Topic: Clinical - Request for Lab/Test Order >> Jun 29, 2024 12:31 PM Dedra B wrote: Reason for CRM: Pt is scheduled to have CT scan and wants to know if her insurance will cover CT scan at hospital or standalone facility. She wants to be scheduled ata location where insurance will cover it. Pls call pt to advise.

## 2024-06-30 ENCOUNTER — Ambulatory Visit: Payer: Self-pay | Admitting: Nurse Practitioner

## 2024-06-30 ENCOUNTER — Other Ambulatory Visit

## 2024-07-01 ENCOUNTER — Ambulatory Visit
Admission: RE | Admit: 2024-07-01 | Discharge: 2024-07-01 | Disposition: A | Discharge status: Home / Self Care | Source: Ambulatory Visit | Attending: Nurse Practitioner | Admitting: Nurse Practitioner

## 2024-07-01 DIAGNOSIS — R911 Solitary pulmonary nodule: Secondary | ICD-10-CM | POA: Diagnosis present

## 2024-07-01 NOTE — Progress Notes (Signed)
 Repeat PCP has resulted the results already.  No further recommendations or changes needed at this time.  Continue current medication regimen.

## 2024-07-06 ENCOUNTER — Other Ambulatory Visit: Payer: Self-pay | Admitting: Nurse Practitioner

## 2024-07-06 ENCOUNTER — Ambulatory Visit: Admitting: Nurse Practitioner

## 2024-07-06 DIAGNOSIS — J90 Pleural effusion, not elsewhere classified: Secondary | ICD-10-CM

## 2024-07-06 DIAGNOSIS — E876 Hypokalemia: Secondary | ICD-10-CM

## 2024-07-06 DIAGNOSIS — R7989 Other specified abnormal findings of blood chemistry: Secondary | ICD-10-CM

## 2024-07-06 NOTE — Telephone Encounter (Signed)
 Copied from CRM [id number]. Topic: Clinical - Medication Question >> Jul 06, 2024 12:09 PM Shereese L wrote: Reason for CRM: patient is requesting a call back in reference to potassium and dieretic medication that she was prescribed and would like it to be prescribed again because her legs and ankles are still swelling... Also wants to discuss catscan  Patient would like medication sent to  [facility] Address: [address] [place], [place] [address] Phone: [phone]

## 2024-07-06 NOTE — Addendum Note (Signed)
 Addended by: Gaylyn Berish on: 07/06/2024 04:39 PM   Modules accepted: Orders

## 2024-07-06 NOTE — Telephone Encounter (Addendum)
 Spoke with pt to address questions about CT scan. Pt states she already read Matt's message about CT scan. Says she is currently in [place] and needs refills sent to [facility], [place] due to legs/ankles still swelling. Pt asks how soon rx will be sent in. Notified her the message will be fwd to [facility] to address. Pt expresses her thanks.

## 2024-07-07 ENCOUNTER — Telehealth (HOSPITAL_COMMUNITY): Admitting: Physician Assistant

## 2024-07-07 NOTE — Telephone Encounter (Unsigned)
 Copied from CRM [id number]. Topic: Clinical - Medication Question >> Jul 06, 2024 12:09 PM Shereese L wrote: Reason for CRM: patient is requesting a call back in reference to potassium and dieretic medication that she was prescribed and would like it to be prescribed again because her legs and ankles are still swelling... Also wants to discuss catscan  Patient would like medication sent to  [facility] Address: [address] [place], [place] [address] Phone: [phone] >> Jul 07, 2024  4:27 PM Winona R wrote: Pt calling to check the status of the medication refill. Pt states she's leaving [facility] tomorrow and needs to know if it will be sent today because she really need it.

## 2024-07-08 MED ORDER — POTASSIUM CHLORIDE CRYS ER 20 MEQ PO TBCR: 20.0000 meq | EXTENDED_RELEASE_TABLET | Freq: Every day | ORAL | 0 refills | Status: DC

## 2024-07-08 MED ORDER — FUROSEMIDE 20 MG PO TABS
20.0000 mg | ORAL_TABLET | Freq: Every day | ORAL | 0 refills | Status: DC
Start: 2024-07-08 — End: 2024-08-03

## 2024-07-08 NOTE — Telephone Encounter (Unsigned)
 Copied from CRM [id number]. Topic: Clinical - Medication Question >> Jul 06, 2024 12:09 PM Shereese L wrote: Reason for CRM: patient is requesting a call back in reference to potassium and dieretic medication that she was prescribed and would like it to be prescribed again because her legs and ankles are still swelling... Also wants to discuss catscan  Patient would like medication sent to  [facility] Address: [address] [place], [place] [address] Phone: [phone] >> Jul 07, 2024  4:27 PM Winona R wrote: Pt calling to check the status of the medication refill. Pt states she's leaving [facility] tomorrow and needs to know if it will be sent today because she really need it.

## 2024-07-08 NOTE — Telephone Encounter (Signed)
 Spoke with pt notifying her rxs were sent to [facility], [place] as requested. Pt expresses her thanks.

## 2024-07-08 NOTE — Telephone Encounter (Unsigned)
 Copied from CRM [id number]. Topic: Clinical - Prescription Issue >> Jul 08, 2024  9:24 AM Treva T wrote: Reason for CRM: Patient calling, states that she now needs her medication sent to her local pharmacy as she is no longer out of town.  Medication: furosemide  (LASIX ) 20 MG tablet  States she received notification that medication was sent to [facility] in [place].  Patient is now requesting medication be sent to: [id number] - [place], [place] - [address] [place] [address] Phone: [phone] Fax: [phone]  Patient requesting a follow up call once completed, and can be reached at [phone]

## 2024-07-09 ENCOUNTER — Other Ambulatory Visit (HOSPITAL_COMMUNITY): Payer: Self-pay | Admitting: Physician Assistant

## 2024-07-09 DIAGNOSIS — F411 Generalized anxiety disorder: Secondary | ICD-10-CM

## 2024-07-12 ENCOUNTER — Encounter

## 2024-07-13 ENCOUNTER — Encounter (HOSPITAL_COMMUNITY): Payer: Self-pay | Admitting: Physician Assistant

## 2024-07-13 ENCOUNTER — Telehealth (HOSPITAL_COMMUNITY): Payer: Self-pay

## 2024-07-13 ENCOUNTER — Telehealth (INDEPENDENT_AMBULATORY_CARE_PROVIDER_SITE_OTHER): Admitting: Physician Assistant

## 2024-07-13 ENCOUNTER — Telehealth: Payer: Self-pay

## 2024-07-13 DIAGNOSIS — Z634 Disappearance and death of family member: Secondary | ICD-10-CM

## 2024-07-13 DIAGNOSIS — F339 Major depressive disorder, recurrent, unspecified: Secondary | ICD-10-CM

## 2024-07-13 DIAGNOSIS — G4700 Insomnia, unspecified: Secondary | ICD-10-CM

## 2024-07-13 DIAGNOSIS — F1090 Alcohol use, unspecified, uncomplicated: Secondary | ICD-10-CM | POA: Diagnosis not present

## 2024-07-13 DIAGNOSIS — F411 Generalized anxiety disorder: Secondary | ICD-10-CM

## 2024-07-13 MED ORDER — HYDROXYZINE HCL 50 MG PO TABS
100.0000 mg | ORAL_TABLET | Freq: Three times a day (TID) | ORAL | 1 refills | Status: DC | PRN
Start: 2024-07-13 — End: 2024-09-14

## 2024-07-13 MED ORDER — FLUOXETINE HCL 20 MG PO CAPS: 60.0000 mg | ORAL_CAPSULE | Freq: Every day | ORAL | 1 refills | Status: DC

## 2024-07-13 MED ORDER — TRAZODONE HCL 300 MG PO TABS: 300.0000 mg | ORAL_TABLET | Freq: Every day | ORAL | 1 refills | Status: DC

## 2024-07-13 MED ORDER — GABAPENTIN 300 MG PO CAPS: 300.0000 mg | ORAL_CAPSULE | Freq: Three times a day (TID) | ORAL | 1 refills | Status: DC

## 2024-07-13 MED ORDER — QUETIAPINE FUMARATE 400 MG PO TABS: 400.0000 mg | ORAL_TABLET | Freq: Every day | ORAL | 1 refills | Status: DC
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
## 2024-07-13 NOTE — Telephone Encounter (Signed)
 Copied from CRM #[id number]. Topic: General - Other >> Jul 13, 2024 11:04 AM Rosina BIRCH wrote: Reason for CRM: patient would like to be called in regards to her stress test on Friday [phone]

## 2024-07-13 NOTE — Telephone Encounter (Signed)
 Patient called in to inform provider that the previous dose of Seroquel  100 mg was prescribed by Akula, Vijaya

## 2024-07-13 NOTE — Telephone Encounter (Signed)
 Can we get more information. She may need to reach out to the cardiologist

## 2024-07-14 MED ORDER — QUETIAPINE FUMARATE 100 MG PO TABS
100.0000 mg | ORAL_TABLET | Freq: Two times a day (BID) | ORAL | 1 refills | Status: DC
Start: 2024-07-14 — End: 2024-09-14

## 2024-07-14 NOTE — Telephone Encounter (Signed)
 She should keep the test as scheduled

## 2024-07-14 NOTE — Telephone Encounter (Unsigned)
 Copied from CRM #[id number]. Topic: General - Other >> Jul 13, 2024 11:04 AM Rosina BIRCH wrote: Reason for CRM: patient would like to be called in regards to her stress test on Friday [phone] >> Jul 14, 2024 10:25 AM Arm[place] J wrote: Patient is calling back to get an update on a whether or not she should keep the stress test. She wanted her provider to know that she has already spoke with cardiology and everything went well. Her CAT scan also came back fine. She does not think the stress test is necessary and is needing approval to cancel the appointment before noon today.   Please call back the patient as soon as possible so she can make proper adjustments to her schedule.

## 2024-07-14 NOTE — Telephone Encounter (Signed)
 Called and spoke with pt grandma.  She stated that the patient already canceled her stress test and stated that matt told her over the phone to cancel her appointment.  Pcp advised pt to keep stress test as scheduled.  Advised to grandmother that I will relay info to pcp.  No further questions or concerns.

## 2024-07-15 ENCOUNTER — Other Ambulatory Visit

## 2024-07-21 ENCOUNTER — Other Ambulatory Visit: Payer: Self-pay | Admitting: Nurse Practitioner

## 2024-07-21 ENCOUNTER — Telehealth (HOSPITAL_COMMUNITY): Payer: Self-pay

## 2024-07-21 DIAGNOSIS — J90 Pleural effusion, not elsewhere classified: Secondary | ICD-10-CM

## 2024-07-21 DIAGNOSIS — E876 Hypokalemia: Secondary | ICD-10-CM

## 2024-07-21 DIAGNOSIS — R7989 Other specified abnormal findings of blood chemistry: Secondary | ICD-10-CM

## 2024-07-21 NOTE — Telephone Encounter (Signed)
 Carla Martinez calls and leaves a message today stating she has been charged with a DWI and her court date is not until 08-26-24. She says she was wondering if it would be best that she go ahead and to CD IOP prior to her court date.  Therapist calls her back to discuss this issues and reaches voice mail. Therapist leaves a HIPAA compliant message requesting a return call.  [name], MS, LMFT, LCAS

## 2024-07-22 ENCOUNTER — Telehealth (HOSPITAL_COMMUNITY): Payer: Self-pay

## 2024-07-22 NOTE — Telephone Encounter (Signed)
 [name]  calls about an appointment for a CCA for CD IOP. Therapist offers this Monday, 07-25-24 at 1pm and she accepts.  Darice Thedora Lemming, M.S.,LMFT, LCAS

## 2024-07-23 ENCOUNTER — Other Ambulatory Visit: Payer: Self-pay | Admitting: Family

## 2024-07-23 DIAGNOSIS — K219 Gastro-esophageal reflux disease without esophagitis: Secondary | ICD-10-CM

## 2024-07-25 ENCOUNTER — Ambulatory Visit (INDEPENDENT_AMBULATORY_CARE_PROVIDER_SITE_OTHER)

## 2024-07-25 DIAGNOSIS — F1020 Alcohol dependence, uncomplicated: Secondary | ICD-10-CM

## 2024-07-25 DIAGNOSIS — F439 Reaction to severe stress, unspecified: Secondary | ICD-10-CM
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
## 2024-07-26 ENCOUNTER — Other Ambulatory Visit (HOSPITAL_COMMUNITY): Payer: Self-pay | Admitting: Physician Assistant

## 2024-07-26 DIAGNOSIS — R002 Palpitations: Secondary | ICD-10-CM

## 2024-07-26 DIAGNOSIS — G4700 Insomnia, unspecified: Secondary | ICD-10-CM

## 2024-07-26 NOTE — Telephone Encounter (Signed)
 Message acknowledged and reviewed.

## 2024-07-27 ENCOUNTER — Ambulatory Visit: Payer: Self-pay | Admitting: Cardiology

## 2024-07-27 MED ORDER — METOPROLOL SUCCINATE ER 25 MG PO TB24: 12.5000 mg | ORAL_TABLET | Freq: Every day | ORAL | 3 refills | Status: DC

## 2024-07-27 NOTE — Progress Notes (Signed)
 Hematuria of 80 bpm.  No atrial fibrillation or atrial flutter.  No sustained arrhythmia.  1 episode of nonsustained VT lasting 6 beats and 1 episode of nonsustained SVT lasting 5 beats.  Recommendation with findings is to start Toprol -XL 12.5 mg daily.  Continue to monitor blood pressure and heart rate 1 to 2 hours postmedication administration at home.  This medication is primarily to prevent palpitations and regulate heart rates.

## 2024-07-29 ENCOUNTER — Telehealth: Payer: Self-pay | Admitting: Nurse Practitioner

## 2024-07-29 NOTE — Telephone Encounter (Signed)
 Pt drop off License plate application form to be filled out for a plate placed in Matts box pt would like to be called when finish [phone]

## 2024-08-01 ENCOUNTER — Telehealth: Payer: Self-pay | Admitting: Emergency Medicine

## 2024-08-01 ENCOUNTER — Telehealth: Payer: Self-pay

## 2024-08-01 NOTE — Telephone Encounter (Signed)
 Pt called to answer questions about meds and monitor, message left for call back

## 2024-08-01 NOTE — Telephone Encounter (Signed)
 Left message for patient to return call to the office.

## 2024-08-01 NOTE — Telephone Encounter (Signed)
-----   Message from Berwyn MATSU sent at 08/01/2024 10:32 AM EDT ----- Regarding: marlise of med Pt has question about monitor and medications can you please call her at  [phone]

## 2024-08-01 NOTE — Telephone Encounter (Signed)
-----   Message from Nurse Marcus ORN sent at 08/01/2024 11:03 AM EDT ----- Regarding: FW: marlise of med Pt called and left message ----- Message ----- From: Jackson Berwyn ORN Sent: 08/01/2024  10:34 AM EDT To: [facility] [place] [facility] Triage Subject: queston of med                                 Pt has question about monitor and medications can you please call her at  [phone]

## 2024-08-02 ENCOUNTER — Encounter (HOSPITAL_COMMUNITY): Payer: Self-pay

## 2024-08-02 ENCOUNTER — Telehealth: Payer: Self-pay

## 2024-08-02 ENCOUNTER — Ambulatory Visit (HOSPITAL_COMMUNITY): Admitting: Licensed Clinical Social Worker

## 2024-08-02 ENCOUNTER — Ambulatory Visit: Payer: Self-pay

## 2024-08-02 DIAGNOSIS — F411 Generalized anxiety disorder: Secondary | ICD-10-CM

## 2024-08-02 DIAGNOSIS — F322 Major depressive disorder, single episode, severe without psychotic features: Secondary | ICD-10-CM

## 2024-08-02 DIAGNOSIS — F1020 Alcohol dependence, uncomplicated: Secondary | ICD-10-CM

## 2024-08-02 DIAGNOSIS — J90 Pleural effusion, not elsewhere classified: Secondary | ICD-10-CM

## 2024-08-02 DIAGNOSIS — K219 Gastro-esophageal reflux disease without esophagitis: Secondary | ICD-10-CM

## 2024-08-02 DIAGNOSIS — R7989 Other specified abnormal findings of blood chemistry: Secondary | ICD-10-CM

## 2024-08-02 NOTE — Telephone Encounter (Signed)
 LAST APPOINTMENT DATE: 06/21/2024   NEXT APPOINTMENT DATE: Visit date not found   Pantoprazole   40MG  LAST REFILL: 06/19/2024  QTY: #30 [phone]
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
## 2024-08-02 NOTE — Telephone Encounter (Signed)
 FYI Only or Action Required?: Action required by provider: update on patient condition.  Patient was last seen in primary care on 06/21/2024 by Wendee Lynwood HERO, NP.  Called Nurse Triage reporting Edema.  Symptoms began several weeks ago.  Interventions attempted: Nothing.  Symptoms are: gradually worsening.  Triage Disposition: See HCP Within 4 Hours (Or PCP Triage)  Patient/caregiver understands and will follow disposition?: No, refuses disposition           Copied from CRM #[id number]. Topic: Clinical - Red Word Triage >> Aug 02, 2024  3:18 PM Rea BROCKS wrote: Red Word that prompted transfer to Nurse Triage: Patient is swelling really badly and furosemide  was declined recently. Patient can't see ankles, very uncomfortable to walk because she feels like legs are going to explode. Reason for Disposition  SEVERE leg swelling (e.g., swelling extends above knee, entire leg is swollen, weeping fluid)    Triager strongly recommended ED for further evaluation/treatment d/t being EOD and no access with PCP.   Triager notified pt that lasix  was denied by PCP from 10/02 request. Triager also noted in PCP note that pt needed F/U with cardiology-- pt reports that she has upcoming appt on Friday but does not think she can tolerate pain. Triager reinforced ED again, pt states that she will just wait and hear from [facility].  Triager will forward encounter for Wendee, NP 's office to review and advise. Patient verbalized understanding and is expecting call back from office for next steps, if any. Triager also advised that if pt does not hear back from office, to follow disposition to ED for further evaluation/treatment.  Answer Assessment - Initial Assessment Questions 1. ONSET: When did the swelling start? (e.g., minutes, hours, days)     Several weeks 2. LOCATION: What part of the leg is swollen?  Are both legs swollen or just one leg?     bilateral 3. SEVERITY: How bad is the swelling?  (e.g., localized; mild, moderate, severe)     To knees 4. REDNESS: Is there redness or signs of infection?     denies 5. PAIN: Is the swelling painful to touch? If Yes, ask: How painful is it?   (Scale 1-10; mild, moderate or severe)     *No Answer* 6. FEVER: Do you have a fever? If Yes, ask: What is it, how was it measured, and when did it start?      denies 7. CAUSE: What do you think is causing the leg swelling?     CHF 8. MEDICAL HISTORY: Do you have a history of blood clots (e.g., DVT), cancer, heart failure, kidney disease, or liver failure?     SOB, pounding of heart - going on for a while a few months + 9. RECURRENT SYMPTOM: Have you had leg swelling before? If Yes, ask: When was the last time? What happened that time?     N/a 10. OTHER SYMPTOMS: Do you have any other symptoms? (e.g., chest pain, difficulty breathing)       See above 11. PREGNANCY: Is there any chance you are pregnant? When was your last menstrual period?       N/a  Protocols used: Leg Swelling and Edema-A-AH

## 2024-08-03 ENCOUNTER — Telehealth: Payer: Self-pay | Admitting: Nurse Practitioner

## 2024-08-03 ENCOUNTER — Other Ambulatory Visit: Payer: Self-pay | Admitting: Nurse Practitioner

## 2024-08-03 DIAGNOSIS — E876 Hypokalemia: Secondary | ICD-10-CM

## 2024-08-03 DIAGNOSIS — J90 Pleural effusion, not elsewhere classified: Secondary | ICD-10-CM

## 2024-08-03 DIAGNOSIS — R7989 Other specified abnormal findings of blood chemistry: Secondary | ICD-10-CM

## 2024-08-03 MED ORDER — POTASSIUM CHLORIDE CRYS ER 20 MEQ PO TBCR: 20.0000 meq | EXTENDED_RELEASE_TABLET | Freq: Every day | ORAL | 0 refills | Status: DC

## 2024-08-03 MED ORDER — PANTOPRAZOLE SODIUM 40 MG PO TBEC: 40.0000 mg | DELAYED_RELEASE_TABLET | Freq: Every day | ORAL | 1 refills | Status: DC

## 2024-08-03 MED ORDER — FUROSEMIDE 20 MG PO TABS: 20.0000 mg | ORAL_TABLET | Freq: Every day | ORAL | 0 refills | Status: DC

## 2024-08-03 NOTE — Telephone Encounter (Signed)
 Copied from CRM #[id number]. Topic: General - Call Back - No Documentation >> Aug 02, 2024  3:14 PM Leah C wrote: Reason for CRM: Patient is calling in to check up on the progress of the form being completed for the handicap license plate application. Patient would appreciate a call back on when it is completed.   [phone] BENNIE)

## 2024-08-03 NOTE — Telephone Encounter (Signed)
 I have refilled the lasix  and potassium. She needs to ask and see if cardiology will take over and manage these medications

## 2024-08-03 NOTE — Addendum Note (Signed)
 Addended by: WENDEE LYNWOOD HERO on: 08/03/2024 01:36 PM   Modules accepted: Orders

## 2024-08-03 NOTE — Telephone Encounter (Signed)
 This has been completed and placed in the out going MA box

## 2024-08-03 NOTE — Telephone Encounter (Signed)
 Refill provided

## 2024-08-03 NOTE — Telephone Encounter (Signed)
 Called pt and informed her that handicap form is complete. Pt verbalized understanding and has no questions or concerns at this time.

## 2024-08-03 NOTE — Telephone Encounter (Signed)
Pt came by and picked up form

## 2024-08-04 ENCOUNTER — Encounter (HOSPITAL_COMMUNITY): Payer: Self-pay | Admitting: Licensed Clinical Social Worker

## 2024-08-05 ENCOUNTER — Ambulatory Visit: Attending: Cardiology

## 2024-08-05 DIAGNOSIS — R0602 Shortness of breath: Secondary | ICD-10-CM

## 2024-08-05 LAB — ECHOCARDIOGRAM COMPLETE
AR max vel: 2.75 cm2
AV Area VTI: 2.76 cm2
AV Area mean vel: 2.63 cm2
AV Mean grad: 2 mmHg
AV Peak grad: 4.2 mmHg
Ao pk vel: 1.03 m/s
Area-P 1/2: 3.39 cm2
S' Lateral: 2.8 cm

## 2024-08-08 ENCOUNTER — Ambulatory Visit: Payer: Self-pay

## 2024-08-08 NOTE — Telephone Encounter (Addendum)
 Copied from CRM #[id number]. Topic: Clinical - Red Word Triage >> Aug 08, 2024  9:55 AM Berwyn MATSU wrote: Red Word that prompted transfer to Nurse Triage: Pain on tailbone, fell yesterday.   PAS attempted to transfer call but pt no longer on line.  Will attempt to reach out to pt.

## 2024-08-08 NOTE — Telephone Encounter (Signed)
 Noted. Will evaluate in office

## 2024-08-08 NOTE — Telephone Encounter (Signed)
 Copied from CRM #[id number]. Topic: Clinical - Red Word Triage >> Aug 08, 2024  9:55 AM Berwyn MATSU wrote: Red Word that prompted transfer to Nurse Triage: Pain on tailbone, fell yesterday.   Attempted to call pt: no answer: left voicemail

## 2024-08-08 NOTE — Telephone Encounter (Signed)
 FYI Only or Action Required?: Action required by provider: update on patient condition.  Patient was last seen in primary care on 06/21/2024 by Wendee Lynwood HERO, NP.  Called Nurse Triage reporting Fall, Back Pain, Hip Pain, and Tailbone Pain.  Symptoms began yesterday.  Interventions attempted: Other: [facility].  Symptoms are: unchanged.  Triage Disposition: See HCP Within 4 Hours (Or PCP Triage)  Patient/caregiver understands and will follow disposition?: Yes  Copied from CRM #[id number]. Topic: Clinical - Red Word Triage >> Aug 08, 2024  9:47 AM Antwanette L wrote: Red Word that prompted transfer to Nurse Triage: The patient reports that they were seen at [facility] yesterday after a fall. They are currently experiencing significant pain and state they can barely move. The patient hit their tailbone during the fall Reason for Disposition  [1] SEVERE back pain (e.g., excruciating, unable to do any normal activities) AND [2] not improved 2 hours after pain medicine  Answer Assessment - Initial Assessment Questions Patient requests to be seen today, called [facility], no appointment availability today   1. ONSET: When did the pain begin? (e.g., minutes, hours, days)     One day ago Fall yesterday, seen at [facility] 2. LOCATION: Where does it hurt? (upper, mid or lower back)     Low back pain, right hip, tail bone 3. SEVERITY: How bad is the pain?  (e.g., Scale 1-10; mild, moderate, or severe)     10/10 4. PATTERN: Is the pain constant? (e.g., yes, no; constant, intermittent)      constant 5. RADIATION: Does the pain shoot into your legs or somewhere else?     N/a 6. CAUSE:  What do you think is causing the back pain?      Fall on 08/07/2024 7. BACK OVERUSE:  Any recent lifting of heavy objects, strenuous work or exercise?     N/a 8. MEDICINES: What have you taken so far for the pain? (e.g., nothing, acetaminophen , NSAIDS)     Patient states that she took furosemide   for pain, then said that she could not remember the name and that I could look it up 9. NEUROLOGIC SYMPTOMS: Do you have any weakness, numbness, or problems with bowel/bladder control?     Yes, states that her leg buckles from time to time 10. OTHER SYMPTOMS: Do you have any other symptoms? (e.g., fever, abdomen pain, burning with urination, blood in urine)       No fever, but admits to burning with urination 11. PREGNANCY: Is there any chance you are pregnant? When was your last menstrual period?       N/a  Protocols used: Back Pain-A-AH

## 2024-08-08 NOTE — Telephone Encounter (Signed)
 Patient called back and spoke with RN Ellouise this morning. See nurse triage encounter today by RN Ellouise. Closing duplicate encounter. Reason for Disposition . Caller has already spoken with another triager and has no further questions.  Protocols used: No Contact or Duplicate Contact Call-A-AH  This encounter was created in error - please disregard.

## 2024-08-09 ENCOUNTER — Ambulatory Visit: Admitting: Nurse Practitioner

## 2024-08-09 ENCOUNTER — Telehealth: Payer: Self-pay

## 2024-08-09 ENCOUNTER — Ambulatory Visit (INDEPENDENT_AMBULATORY_CARE_PROVIDER_SITE_OTHER)
Admission: RE | Admit: 2024-08-09 | Discharge: 2024-08-09 | Disposition: A | Discharge status: Home / Self Care | Source: Ambulatory Visit | Attending: Nurse Practitioner | Admitting: Nurse Practitioner

## 2024-08-09 VITALS — BP 100/58 | HR 89 | Temp 98.0°F | Ht 64.0 in | Wt 96.1 lb

## 2024-08-09 DIAGNOSIS — W19XXXD Unspecified fall, subsequent encounter: Secondary | ICD-10-CM

## 2024-08-09 DIAGNOSIS — K5903 Drug induced constipation: Secondary | ICD-10-CM

## 2024-08-09 DIAGNOSIS — G894 Chronic pain syndrome: Secondary | ICD-10-CM

## 2024-08-09 DIAGNOSIS — R634 Abnormal weight loss: Secondary | ICD-10-CM | POA: Diagnosis not present

## 2024-08-09 DIAGNOSIS — M545 Low back pain, unspecified: Secondary | ICD-10-CM

## 2024-08-09 MED ORDER — HYDROCODONE-ACETAMINOPHEN 10-325 MG PO TABS: 1.0000 | ORAL_TABLET | Freq: Three times a day (TID) | ORAL | 0 refills | Status: AC | PRN

## 2024-08-09 NOTE — Patient Instructions (Signed)
 Nice to see you today  Follow up with me in 6 weeks
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
## 2024-08-09 NOTE — Progress Notes (Signed)
 Established Patient Office Visit  Subjective   Patient ID: Carla Martinez, female    DOB: 1961-03-17  Age: 63 y.o. MRN: [id number]  Chief Complaint  Patient presents with   Fall    Pt complains of back, hip, and tailbone pain that happened Thursday.       Discussed the use of AI scribe software for clinical note transcription with the patient, who gave verbal consent to proceed.  History of Present Illness Carla Martinez is a 63 year old female who presents with joint instability and severe pain following a fall.  She experiences episodes where her joints 'just like fold' and 'buckle on me like a marionette,' leading to a fall last Thursday. She fell straight back, landing on her tailbone, resulting in excruciating pain. She visited [facility] the following day, where x-rays were performed, but she does not know if anything was broken. The specialist was unable to determine the cause of her symptoms and prescribed a medication she believes is hydrocodone , which has not been effective in managing her pain.  The pain is located across her lower back and around to the sides, but does not extend down her legs. No new weakness is noted, but she feels generally weak, attributing it to not feeling well. She has experienced a weight loss of six pounds, which is significant given her low body weight. Her appetite has decreased, and she reports not feeling like eating.  She has been using Nyquil at night, unrelated to the fall. She denies alcohol consumption. She reports difficulty with bowel movements, requiring a laxative to go, and experiences significant pain when trying to bear down. Her legs feel like 'jello' and she has difficulty breathing deeply due to pain that extends down her back.  Her past medical history includes a rod in her femur and two screws in her hip, which were previously discussed for removal but have not been addressed due to unspecified issues with her  'numbers.'  Patient mentions that she has not been on the vivotrol for a while and is unsure if she wants to go back on it. Did discuss that she needs to wait at least for a week after finishing opioid medications prior to reinitiating therapy after discussing with her behavioral health clinician.    Review of Systems  Constitutional:  Negative for chills and fever.  Respiratory:  Negative for shortness of breath.   Cardiovascular:  Negative for chest pain.  Gastrointestinal:  Positive for constipation.  Musculoskeletal:  Positive for back pain and joint pain.  Neurological:  Negative for tingling, weakness and headaches.  Psychiatric/Behavioral:  Negative for hallucinations and suicidal ideas.       Objective:     BP (!) 100/58   Pulse 89   Temp 98 F (36.7 C) (Oral)   Ht 5' 4 (1.626 m)   Wt 96 lb 1.6 oz (43.6 kg)   SpO2 94%   BMI 16.50 kg/m  BP Readings from Last 3 Encounters:  08/09/24 (!) 100/58  06/28/24 104/60  06/21/24 116/62   Wt Readings from Last 3 Encounters:  08/09/24 96 lb 1.6 oz (43.6 kg)  06/28/24 100 lb (45.4 kg)  06/21/24 102 lb (46.3 kg)   SpO2 Readings from Last 3 Encounters:  08/09/24 94%  06/28/24 99%  06/21/24 96%      Physical Exam Vitals and nursing note reviewed.  Constitutional:      Appearance: Normal appearance.  Cardiovascular:     Rate and  Rhythm: Normal rate and regular rhythm.     Heart sounds: Normal heart sounds.  Pulmonary:     Effort: Pulmonary effort is normal.     Breath sounds: Normal breath sounds.  Musculoskeletal:        General: Tenderness present.       Arms:     Lumbar back: Tenderness present. No bony tenderness. Positive right straight leg raise test and positive left straight leg raise test.     Comments: Bilateral lower extremity strength 4/5 secondary to pain  Patellar tendon reflex 2+ bilaterally  PT pulse 1+ bilaterally   Neurological:     Mental Status: She is alert.      No results found for  any visits on 08/09/24.    The ASCVD Risk score (Arnett DK, et al., 2019) failed to calculate for the following reasons:   Risk score cannot be calculated because patient has a medical history suggesting prior/existing ASCVD    Assessment & Plan:   Problem List Items Addressed This Visit   None Visit Diagnoses       Fall, subsequent encounter    -  Primary   Relevant Medications   HYDROcodone -acetaminophen  (NORCO) 10-325 MG tablet     Assessment and Plan Assessment & Plan Acute low back pain after fall Acute low back pain post-fall, localized to lower back and sides, worsened by movement and deep breathing. No new weakness or leg pain. Recent x-rays showed no clear cause. - Increase hydrocodone  to higher dose, three times daily. - Advised against Nyquil or alcohol with pain medication. - Recommended heat, ice, and topical treatments for pain. - Encouraged activities as tolerated, avoid sedating substances. - Advised against back brace to prevent muscle weakening. 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 No clear etiology from orthopedic evaluation.  Chronic pain syndrome Chronic pain syndrome exacerbated by recent fall. Pain management prioritized to improve quality of life. Previous higher hydrocodone  dose was more effective.  Unintentional weight loss Unintentional weight loss of six pounds, significant due to low baseline weight. Decreased appetite and slightly low blood pressure, possibly from decreased intake. - Encouraged adequate nutrition and fluid intake to stabilize weight and support blood pressure.  Constipation due to opioid use Constipation likely from opioid use, requiring laxatives for bowel movements. - Continue laxatives as needed for bowel movements.   Return in about 6 weeks (around 09/20/2024) for weight recheck.    Adina Crandall, NP

## 2024-08-10 ENCOUNTER — Ambulatory Visit: Payer: Self-pay | Admitting: Nurse Practitioner

## 2024-08-10 DIAGNOSIS — S32010A Wedge compression fracture of first lumbar vertebra, initial encounter for closed fracture: Secondary | ICD-10-CM

## 2024-08-11 ENCOUNTER — Encounter (HOSPITAL_COMMUNITY): Payer: Self-pay | Admitting: Licensed Clinical Social Worker

## 2024-08-11 ENCOUNTER — Ambulatory Visit (HOSPITAL_COMMUNITY): Admitting: Licensed Clinical Social Worker

## 2024-08-13 ENCOUNTER — Emergency Department (HOSPITAL_COMMUNITY)

## 2024-08-13 ENCOUNTER — Other Ambulatory Visit: Payer: Self-pay

## 2024-08-13 ENCOUNTER — Encounter (HOSPITAL_COMMUNITY): Payer: Self-pay | Admitting: Emergency Medicine

## 2024-08-13 ENCOUNTER — Emergency Department (HOSPITAL_COMMUNITY)
Admission: EM | Admit: 2024-08-13 | Discharge: 2024-08-14 | Disposition: A | Discharge status: Home / Self Care | Attending: Emergency Medicine | Admitting: Emergency Medicine

## 2024-08-13 DIAGNOSIS — I509 Heart failure, unspecified: Secondary | ICD-10-CM | POA: Diagnosis not present

## 2024-08-13 DIAGNOSIS — R197 Diarrhea, unspecified: Secondary | ICD-10-CM | POA: Diagnosis not present

## 2024-08-13 DIAGNOSIS — S32018A Other fracture of first lumbar vertebra, initial encounter for closed fracture: Secondary | ICD-10-CM | POA: Diagnosis not present

## 2024-08-13 DIAGNOSIS — S32010A Wedge compression fracture of first lumbar vertebra, initial encounter for closed fracture: Secondary | ICD-10-CM

## 2024-08-13 DIAGNOSIS — S3992XA Unspecified injury of lower back, initial encounter: Secondary | ICD-10-CM | POA: Diagnosis present

## 2024-08-13 DIAGNOSIS — W1839XA Other fall on same level, initial encounter: Secondary | ICD-10-CM | POA: Insufficient documentation

## 2024-08-13 DIAGNOSIS — K2289 Other specified disease of esophagus: Secondary | ICD-10-CM | POA: Insufficient documentation

## 2024-08-13 DIAGNOSIS — R112 Nausea with vomiting, unspecified: Secondary | ICD-10-CM

## 2024-08-13 LAB — COMPREHENSIVE METABOLIC PANEL WITH GFR
ALT: 22 U/L (ref 0–44)
AST: 25 U/L (ref 15–41)
Albumin: 3.3 g/dL — ABNORMAL LOW (ref 3.5–5.0)
Alkaline Phosphatase: 86 U/L (ref 38–126)
Anion gap: 16 — ABNORMAL HIGH (ref 5–15)
BUN: 23 mg/dL — ABNORMAL HIGH (ref 6–20)
CO2: 18 mmol/L — ABNORMAL LOW (ref 22–32)
Calcium: 9.3 mg/dL (ref 8.9–10.3)
Chloride: 103 mmol/L (ref 98–111)
Creatinine, Ser: 0.87 mg/dL (ref 0.44–1.00)
GFR, Estimated: >60 mL/min (ref >60)
Glucose, Bld: 104 mg/dL — ABNORMAL HIGH (ref 70–99)
Potassium: 3.9 mmol/L (ref 3.5–5.1)
Sodium: 137 mmol/L (ref 135–145)
Total Bilirubin: 0.8 mg/dL (ref 0.0–1.2)
Total Protein: 6.3 g/dL — ABNORMAL LOW (ref 6.5–8.1)

## 2024-08-13 LAB — CBC
HCT: 35.9 % — ABNORMAL LOW (ref 36.0–46.0)
Hemoglobin: 12.4 g/dL (ref 12.0–15.0)
MCH: 28.8 pg (ref 26.0–34.0)
MCHC: 34.5 g/dL (ref 30.0–36.0)
MCV: 83.5 fL (ref 80.0–100.0)
Platelets: 386 K/uL (ref 150–400)
RBC: 4.3 MIL/uL (ref 3.87–5.11)
RDW: 13.5 % (ref 11.5–15.5)
WBC: 10.6 K/uL — ABNORMAL HIGH (ref 4.0–10.5)
nRBC: 0 % (ref 0.0–0.2)

## 2024-08-13 LAB — ETHANOL: Alcohol, Ethyl (B): <15 mg/dL (ref <15)

## 2024-08-13 LAB — LIPASE, BLOOD: Lipase: 18 U/L (ref 11–51)

## 2024-08-13 MED ORDER — LORAZEPAM 2 MG/ML IJ SOLN
0.0000 mg | Freq: Four times a day (QID) | INTRAMUSCULAR | Status: DC
  Administered 2024-08-13: 2 mg via INTRAVENOUS
  Filled 2024-08-13: qty 1

## 2024-08-13 MED ORDER — THIAMINE MONONITRATE 100 MG PO TABS: 100.0000 mg | ORAL_TABLET | Freq: Every day | ORAL | Status: DC

## 2024-08-13 MED ORDER — THIAMINE HCL 100 MG/ML IJ SOLN
100.0000 mg | Freq: Every day | INTRAMUSCULAR | Status: DC
  Administered 2024-08-13: 100 mg via INTRAVENOUS
  Filled 2024-08-13: qty 2

## 2024-08-13 MED ORDER — OMEPRAZOLE 20 MG PO CPDR: 20.0000 mg | DELAYED_RELEASE_CAPSULE | Freq: Every day | ORAL | 0 refills | Status: DC

## 2024-08-13 MED ORDER — IOHEXOL 350 MG/ML SOLN
75.0000 mL | Freq: Once | INTRAVENOUS | Status: AC | PRN
  Administered 2024-08-13: 75 mL via INTRAVENOUS

## 2024-08-13 MED ORDER — ALUM & MAG HYDROXIDE-SIMETH 200-200-20 MG/5ML PO SUSP
30.0000 mL | Freq: Once | ORAL | Status: AC
  Administered 2024-08-13: 30 mL via ORAL
  Filled 2024-08-13: qty 30

## 2024-08-13 MED ORDER — SODIUM CHLORIDE 0.9 % IV BOLUS: 500.0000 mL | Freq: Once | INTRAVENOUS | Status: DC

## 2024-08-13 MED ORDER — FAMOTIDINE IN NACL 20-0.9 MG/50ML-% IV SOLN
20.0000 mg | Freq: Once | INTRAVENOUS | Status: AC
  Administered 2024-08-13: 20 mg via INTRAVENOUS
  Filled 2024-08-13: qty 50

## 2024-08-13 MED ORDER — LORAZEPAM 2 MG/ML IJ SOLN: 0.0000 mg | Freq: Two times a day (BID) | INTRAMUSCULAR | Status: DC

## 2024-08-13 MED ORDER — MORPHINE SULFATE (PF) 2 MG/ML IV SOLN
2.0000 mg | Freq: Once | INTRAVENOUS | Status: AC
Start: 2024-08-13 — End: 2024-08-13
  Administered 2024-08-13: 2 mg via INTRAVENOUS
  Filled 2024-08-13: qty 1

## 2024-08-13 MED ORDER — SODIUM CHLORIDE 0.9 % IV BOLUS
1000.0000 mL | Freq: Once | INTRAVENOUS | Status: AC
  Administered 2024-08-13: 1000 mL via INTRAVENOUS

## 2024-08-13 MED ORDER — LORAZEPAM 1 MG PO TABS: 0.0000 mg | ORAL_TABLET | Freq: Two times a day (BID) | ORAL | Status: DC

## 2024-08-13 MED ORDER — SODIUM CHLORIDE 0.9 % IV SOLN
12.5000 mg | Freq: Once | INTRAVENOUS | Status: AC
  Administered 2024-08-14: 12.5 mg via INTRAVENOUS
  Filled 2024-08-13: qty 0.5

## 2024-08-13 MED ORDER — LORAZEPAM 2 MG/ML IJ SOLN: 1.0000 mg | Freq: Once | INTRAMUSCULAR | Status: DC

## 2024-08-13 MED ORDER — ONDANSETRON HCL 4 MG/2ML IJ SOLN
4.0000 mg | Freq: Once | INTRAMUSCULAR | Status: AC
  Administered 2024-08-13: 4 mg via INTRAVENOUS
  Filled 2024-08-13: qty 2

## 2024-08-13 MED ORDER — LIDOCAINE VISCOUS HCL 2 % MT SOLN
15.0000 mL | Freq: Once | OROMUCOSAL | Status: AC
  Administered 2024-08-13: 15 mL via ORAL
  Filled 2024-08-13: qty 15

## 2024-08-13 MED ORDER — LORAZEPAM 1 MG PO TABS: 0.0000 mg | ORAL_TABLET | Freq: Four times a day (QID) | ORAL | Status: DC

## 2024-08-13 NOTE — Discharge Instructions (Addendum)
 Thank for let us  evaluate you today.  Your imaging of your head and your belly did not show any acute emergent abnormalities.  It redemonstrated that you have a lumbar compression fracture with movement of the spine.  We have given you a brace, pain management here in emergency department.  Please follow-up with orthopedics for further management.  Have also provided you with neurosurgery follow-up.  Please call to make an appointment with them  Incidentally we have also found some irritation to esophagus from reflux which may be contributing to vomiting, irritation.  Provided you with Maalox here Emergency Department.  Will send antiacid  Return to Emergency Department if you experience numbness or weakness of your lower legs, urinary incontinence, loss of sensation in genital region, fecal incontinence, worsening symptoms

## 2024-08-13 NOTE — ED Triage Notes (Signed)
 Per [facility] pt coming from home c/o emesis all afternoon. Hx of alcohol abuse but would not say when her last drink was. C/o back pain since fall a few weeks ago.

## 2024-08-13 NOTE — ED Provider Notes (Signed)
 [place] EMERGENCY DEPARTMENT AT [facility] Provider Note   CSN: [id number] Arrival date & time: 08/13/24  8091     Patient presents with: Emesis   Carla Martinez is a 63 y.o. female with a past medical history of HF, NSTEMI, prolonged QT, alcohol withdrawal seizures, alcohol abuse, MDD, GAD presents to Emergency Department via EMS from home for evaluation of vomiting, diarrhea, abdominal pain, lower back pain. Unable to provide specifics regarding location of pain, describing pain, quantifying VD. Is passing gas.  Per chart review: Had a fall on 08/04/24 where her legs folded underneath her and gave away causing her to fall straight back landing on her bottom. and was evaluated by PCP on 08/09/2024.  X-rays from 08/09/2024 showed new L1 compression deformity, chronic L2 compression deformity. Was provided hydrocodone  and orthopedic follow up   {Add pertinent medical, surgical, social history, OB history to HPI:32947}  Emesis      Prior to Admission medications   Medication Sig Start Date End Date Taking? Authorizing Provider  omeprazole (PRILOSEC) 20 MG capsule Take 1 capsule (20 mg total) by mouth daily. 08/13/24 09/12/24 Yes Minnie Tinnie BRAVO, PA  acamprosate  (CAMPRAL ) 333 MG tablet Take 2 tablets (666 mg total) by mouth 3 (three) times daily with meals. 05/26/24   Nwoko, Uchenna E, PA  Calcium  Carbonate Antacid (CALCIUM  CARBONATE PO) Take 1 tablet by mouth at bedtime.    [provider]  cyanocobalamin  (VITAMIN B12) 500 MCG tablet Take 1 tablet (500 mcg total) by mouth daily. 02/24/24   Cherlyn Labella, MD  FLUoxetine  (PROZAC ) 20 MG capsule Take 3 capsules (60 mg total) by mouth daily. 07/13/24 07/13/25  Nwoko, Uchenna E, PA  FLUoxetine  (PROZAC ) 40 MG capsule Take 1 capsule (40 mg total) by mouth daily. 05/26/24   Nwoko, Uchenna E, PA  folic acid  (FOLVITE ) 1 MG tablet Take 1 tablet (1 mg total) by mouth daily. Patient not taking: Reported on 08/09/2024 02/24/24    Akula, Vijaya, MD  furosemide  (LASIX ) 20 MG tablet Take 1 tablet (20 mg total) by mouth daily. 08/03/24   Wendee Lynwood HERO, NP  gabapentin  (NEURONTIN ) 300 MG capsule Take 1 capsule (300 mg total) by mouth 3 (three) times daily. 07/13/24   Nwoko, Uchenna E, PA  HYDROcodone -acetaminophen  (NORCO) 10-325 MG tablet Take 1 tablet by mouth every 8 (eight) hours as needed for up to 4 days. 08/09/24 08/13/24  Wendee Lynwood HERO, NP  hydrOXYzine  (ATARAX ) 50 MG tablet Take 2 tablets (100 mg total) by mouth 3 (three) times daily as needed. 07/13/24   Nwoko, Uchenna E, PA  melatonin 3 MG TABS tablet Take 6 mg by mouth. 04/22/24   [provider]  metoprolol  succinate (TOPROL  XL) 25 MG 24 hr tablet Take 0.5 tablets (12.5 mg total) by mouth daily. 07/27/24 07/27/25  Gerard Frederick, NP  Multiple Vitamin (MULTIVITAMIN WITH MINERALS) TABS tablet Take 1 tablet by mouth daily. Patient not taking: Reported on 08/09/2024 02/24/24   Akula, Vijaya, MD  Naltrexone  (VIVITROL ) 380 MG SUSR Inject 380 mg into the muscle every 28 (twenty-eight) days. Patient not taking: Reported on 08/09/2024 03/14/24   Nwoko, Uchenna E, PA  nicotine  (NICODERM CQ  - DOSED IN MG/24 HOURS) 21 mg/24hr patch Place 1 patch (21 mg total) onto the skin daily. Patient not taking: Reported on 08/09/2024 02/24/24   Cherlyn Labella, MD  pantoprazole  (PROTONIX ) 40 MG tablet Take 1 tablet (40 mg total) by mouth daily. 08/03/24   Wendee Lynwood HERO, NP  potassium  chloride SA (KLOR-CON  M) 20 MEQ tablet Take 1 tablet (20 mEq total) by mouth daily. 08/03/24   Wendee Lynwood HERO, NP  QUEtiapine  (SEROQUEL ) 100 MG tablet Take 1 tablet (100 mg total) by mouth 2 (two) times daily. 07/14/24   Nwoko, Uchenna E, PA  QUEtiapine  (SEROQUEL ) 400 MG tablet Take 1 tablet (400 mg total) by mouth at bedtime. 07/13/24   Nwoko, Uchenna E, PA  simethicone  (MYLICON) 80 MG chewable tablet Chew 80 mg by mouth. Patient not taking: Reported on 08/09/2024 04/22/24 06/21/24  [provider]  thiamine   (VITAMIN B-1) 100 MG tablet Take 1 tablet (100 mg total) by mouth daily. 02/24/24   Cherlyn Labella, MD  trazodone  (DESYREL ) 300 MG tablet Take 1 tablet (300 mg total) by mouth at bedtime. 07/13/24   Nwoko, Uchenna E, PA    Allergies: Patient has no known allergies.    Review of Systems  Gastrointestinal:  Positive for vomiting.    Updated Vital Signs BP 132/68   Pulse 90   Temp 99.5 F (37.5 C) (Oral)   Resp (!) 22   Ht 5' 4 (1.626 m)   Wt 43.5 kg   SpO2 94%   BMI 16.48 kg/m   Physical Exam Vitals and nursing note reviewed.  Constitutional:      General: She is not in acute distress.    Appearance: Normal appearance.  HENT:     Head: Normocephalic and atraumatic.  Eyes:     Conjunctiva/sclera: Conjunctivae normal.  Cardiovascular:     Rate and Rhythm: Normal rate.  Pulmonary:     Effort: Pulmonary effort is normal. No respiratory distress.     Breath sounds: Normal breath sounds.  Abdominal:     General: Bowel sounds are normal. There is no distension.     Palpations: Abdomen is soft.     Tenderness: There is generalized abdominal tenderness. There is no guarding or rebound.     Comments: Nonsurgical abd with no peritoneal signs  Musculoskeletal:     Lumbar back: Bony tenderness present.  Skin:    Coloration: Skin is not jaundiced or pale.  Neurological:     Mental Status: She is alert and oriented to person, place, and time. Mental status is at baseline.     Sensory: No sensory deficit.     Motor: No weakness.     Coordination: Coordination normal.     Comments: Motor 5/5 and sensation 2/2 of BLE      (all labs ordered are listed, but only abnormal results are displayed) Labs Reviewed  COMPREHENSIVE METABOLIC PANEL WITH GFR - Abnormal; Notable for the following components:      Result Value   CO2 18 (*)    Glucose, Bld 104 (*)    BUN 23 (*)    Total Protein 6.3 (*)    Albumin  3.3 (*)    Anion gap 16 (*)    All other components within normal limits  CBC -  Abnormal; Notable for the following components:   WBC 10.6 (*)    HCT 35.9 (*)    All other components within normal limits  LIPASE, BLOOD  ETHANOL  URINALYSIS, ROUTINE W REFLEX MICROSCOPIC  RAPID URINE DRUG SCREEN, HOSP PERFORMED    EKG: None  Radiology: CT Angio Head W or Wo Contrast Result Date: 08/13/2024 EXAM: CTA Head With Intravenous Contrast. CT Head without Contrast. CLINICAL HISTORY: Altered mental status, nontraumatic (Ped 0-17y). TECHNIQUE: Axial CTA images of the head with intravenous contrast. Three-dimensional MIP/volume  rendered reformations were performed. Axial computed tomography images of the head/brain performed without intravenous contrast. Dose reduction technique was used including one or more of the following: automated exposure control, adjustment of mA and kV according to patient size, and/or iterative reconstruction. CONTRAST: Without and with; 75mL (iohexol  (OMNIPAQUE ) 350 MG/ML injection 75 mL IOHEXOL  350 MG/ML SOLN) COMPARISON: None provided. FINDINGS: BRAIN: No acute intraparenchymal hemorrhage. No mass Lesion. No CT evidence for acute territorial infarct. No midline shift or extra-axial collection. VENTRICLES: No hydrocephalus. ORBITS: The orbits are unremarkable. SINUSES AND MASTOIDS: The paranasal sinuses and mastoid air cells are clear INTERNAL CAROTID ARTERIES: The intracranial ICAs are patent with no significant stenosis. No occlusion. No aneurysm. ANTERIOR CEREBRAL ARTERIES: No significant stenosis. No occlusion. No aneurysm. MIDDLE CEREBRAL ARTERIES: No significant stenosis. No occlusion. No aneurysm. POSTERIOR CEREBRAL ARTERIES: No significant stenosis. No occlusion. No aneurysm. BASILAR ARTERY: No significant stenosis. No occlusion. No aneurysm. VERTEBRAL ARTERIES: No significant stenosis. No occlusion. No aneurysm. SOFT TISSUES: No acute finding. No masses or lymphadenopathy. BONES: No acute osseous abnormality. IMPRESSION: 1. No acute intracranial  hemorrhage or ischemic change. 2. No large vessel occlusion or proximal hemodynamically significant stenosis. Electronically signed by: Gilmore Molt MD 08/13/2024 10:24 PM EDT RP Workstation: [id number]   CT ABDOMEN PELVIS W CONTRAST Addendum Date: 08/13/2024 ******** ADDENDUM #1 ******** ADDENDUM: These findings were discussed with the PA taking care of the patient at 09:51 pm Electronically signed by: Dorethia Molt MD 08/13/2024 09:52 PM EDT RP Workstation: [id number]   Result Date: 08/13/2024 ******** ORIGINAL REPORT ******** EXAM: CT ABDOMEN AND PELVIS WITH CONTRAST 08/13/2024 09:33:06 PM TECHNIQUE: CT of the abdomen and pelvis was performed with the administration of intravenous contrast, 75mL (iohexol  (OMNIPAQUE ) 350 MG/ML injection 75 mL IOHEXOL  350 MG/ML SOLN). Multiplanar reformatted images are provided for review. Automated exposure control, iterative reconstruction, and/or weight-based adjustment of the mA/kV was utilized to reduce the radiation dose to as low as reasonably achievable. COMPARISON: Comparison made to Mar 06, 2022 and 07/02/2021  . CLINICAL HISTORY: Abdominal pain, acute, nonlocalized. FINDINGS: LOWER CHEST: Right basilar discoid atelectasis. LIVER: The liver is unremarkable. GALLBLADDER AND BILE DUCTS: Status post cholecystectomy. SPLEEN: No acute abnormality. PANCREAS: A 6 mm hypodense lesion within the uncinate process of the pancreas is unchanged, possibly representing a tiny cyst or side branch ipmn. The pancreas is otherwise unremarkable. ADRENAL GLANDS: No acute abnormality. KIDNEYS, URETERS AND BLADDER: No stones in the kidneys or ureters. No hydronephrosis. No perinephric or periureteral stranding. Urinary bladder is unremarkable. GI AND BOWEL: The stomach, small bowel, and large bowel are otherwise unremarkable. There is thickening of the distal esophagus which is nonspecific but may relate to underlying esophagitis, such as reflux esophagitis. This could be better  assessed with endoscopy if indicated. The appendix is normal. PERITONEUM AND RETROPERITONEUM: No ascites. No free air. VASCULATURE: Mild aortoiliac atherosclerotic calcification. No aortic aneurysm. LYMPH NODES: No lymphadenopathy. REPRODUCTIVE ORGANS: No acute abnormality. BONES AND SOFT TISSUES: There is an acute superior endplate fracture of L1 with approximately 50% loss of height, retropulsion of the posterior superior aspect of the L1 vertebral body by 4 mm without significant associated canal stenosis, mild focal kyphosis at the thoracolumbar junction. No listhesis. Posterior elements appear intact and the facet joints are aligned. Moderate paravertebral infiltration in keeping with edema / hemorrhage, no definite canal hematoma identified. A right L5 perineural cyst remodels the posterior aspect of the right L5 vertebral body. Stable remote superior endplate fracture L2 with approximately 40 - 50% loss  of height centrally. No associated retropulsion. Healed right rib fractures. Right hip ORIF. Healed left inferior pubic ramus fracture. IMPRESSION: 1. Acute superior endplate fracture of L1 with approximately 50% loss of height, 4 mm retropulsion, mild focal kyphosis, and moderate paravertebral infiltration/edema. No significant canal stenosis or definite canal hematoma. 2. Stable remote superior endplate fracture of L2 with approximately 4050% central height loss. No associated retropulsion. 3. Distal esophageal wall thickening, nonspecific, possibly esophagitis (e.g., reflux). Consider endoscopy if clinically indicated. 4. Unchanged 6 mm hypodense lesion in the pancreatic uncinate process since 07/02/2021, likely tiny cyst or side-branch IPMN; pancreas otherwise unremarkable. Follow-up MRI examination in 2 years to be helpful in confirming 5 years of stability and an underlying benign etiology. Electronically signed by: Dorethia Molt MD 08/13/2024 09:48 PM EDT RP Workstation: [id number]     Medications  Ordered in the ED  thiamine  (VITAMIN B1) tablet 100 mg ( Oral See Alternative 08/13/24 2103)    Or  thiamine  (VITAMIN B1) injection 100 mg (100 mg Intravenous Given 08/13/24 2103)  promethazine  (PHENERGAN ) 12.5 mg in sodium chloride  0.9 % 50 mL IVPB (has no administration in time range)  ondansetron  (ZOFRAN ) injection 4 mg (4 mg Intravenous Given 08/13/24 2103)  iohexol  (OMNIPAQUE ) 350 MG/ML injection 75 mL (75 mLs Intravenous Contrast Given 08/13/24 2133)  iohexol  (OMNIPAQUE ) 350 MG/ML injection 75 mL (75 mLs Intravenous Contrast Given 08/13/24 2208)  alum & mag hydroxide-simeth (MAALOX/MYLANTA) 200-200-20 MG/5ML suspension 30 mL (30 mLs Oral Given 08/13/24 2249)    And  lidocaine  (XYLOCAINE ) 2 % viscous mouth solution 15 mL (15 mLs Oral Given 08/13/24 2249)  famotidine  (PEPCID ) IVPB 20 mg premix (0 mg Intravenous Stopped 08/13/24 2336)  morphine  (PF) 2 MG/ML injection 2 mg (2 mg Intravenous Given 08/13/24 2250)  sodium chloride  0.9 % bolus 1,000 mL (1,000 mLs Intravenous New Bag/Given 08/13/24 2350)      {Click here for ABCD2, HEART and other calculators REFRESH Note before signing:1}                              Medical Decision Making Amount and/or Complexity of Data Reviewed Labs: ordered. Radiology: ordered.  Risk OTC drugs. Prescription drug management.   Patient presents to the ED for concern of abd pain, lower back pain, NVD, this involves an extensive number of treatment options, and is a complaint that carries with it a high risk of complications and morbidity.  The differential diagnosis includes fracture, contusion, dislocation, GIB, gastroenteritis, obstruction, perforation, hypoglycemia, hyperglycemia   Co morbidities that complicate the patient evaluation  See HPI   Additional history obtained:  Additional history obtained from Nursing and Outside Medical Records   External records from outside source obtained and reviewed including triage RN note   Lab  Tests:  I Ordered, and personally interpreted labs.  The pertinent results include:   CBG 104 BUN 23 Lipase WNL WBC 10.6   Imaging Studies ordered:  I ordered imaging studies including CTA, CT abdomen pelvis with contrast I independently visualized and interpreted imaging which showed  No acute intracranial abnormality is like Acute superior endplate fracture of L1 with approximately 50% loss of height, 4 mm retropulsion, mild focal kyphosis, and moderate paravertebral infiltration/edema. No significant canal stenosis or definite canal hematoma. Stable remote superior endplate fracture of L2 with approximately 4050% central height loss. No associated retropulsion. Distal esophageal wall thickening, nonspecific, possibly esophagitis (e.g.,reflux). Consider endoscopy if clinically indicated. Unchanged 6 mm hypodense  lesion in the pancreatic uncinate process since 07/02/2021, likely tiny cyst or side-branch IPMN I agree with the radiologist interpretation     Medicines ordered and prescription drug management:  I ordered medication including maalox, pepcid , morphine , thiamine , ativan   for reflux, pain, alcohol use, anxiety  Reevaluation of the patient after these medicines showed that the patient improved I have reviewed the patients home medicines and have made adjustments as needed    Consultations Obtained:  I requested consultation with neurosurgery Dr. Johnanna,  and discussed lab and imaging findings as well as pertinent plan - they recommend:  TSLO Analgesia F/u in 2-4 weeks   Problem List / ED Course:  Abd pain NVD Initially when I went in to assess patient, patient was in fetal position in bed repeating my mom does not know that I drink alcohol.  When I asked her about her last alcohol use, she says a long time ago then she reports that she drank wine yesterday.  Had to ask multiple times how much she drank in the past.  She reports that she would drink multiple  mini wine containers.  Unknown if DT, seizure or withdrawal in past Following this she rocked back and forth stating please help me please help me I am in so much pain. Would not clarify where and would repeatedly state I dont know She was fully alert and oriented but required multiple questions to obtain answers other than I don't know. Was not acting appropriately for age during initial examination. Had concern for intoxication, drug toxidrome, intracranial abnormality so proceeded with CT head Provided ativan  2mg  for anxiety. This would also benefit patient if she was withdrawing. CIWA initially >20 but likely 2/2 pain, anxiety as this resolved following analgesia. No transaminitis. ETOH WNL. No cirrhosis nor abnormalities to liver. Low suspicion for alcohol withdrawal following interventions Tenderness to palpation of abdomen and lower back. Actively vomiting while assessing patient CT notable for esophageal thickening likely related to reflux but no etiology for NVD. No obstruction nor perforation Vital signs hemodynamically stable no fever no tachycardia Potassium WNL Creatinine WNL EKG does not show QT prolongation today Provided zofran  for NV with no improvement to NV No diarrhea in ED NVD abd pain likely 2/2 gastroenteritis  Did provide omeprazole for reflux. Will have her f/u with PCP regarding this Provided phenergan  for NV, IVF for hydration Will PO challenge and if able to tolerate can be dc home  Lumbar spine pain Upon chart review, found PCP note regarding compression fx from recent fall last week. She did not inform me of fall or results from PCP who called her via telephone to inform her of results and recommendations to f/u with ortho L1 acute compression fracture with retropulsion No motor nor sensory deficits to BLE Contacted neurosurgery and noted above Provided TSLO Provided neurosurgery f/u on paperwork   Reevaluation:  After the interventions noted above, I  reevaluated the patient and found that they have :improved     Dispostion:  After consideration of the diagnostic results and the patients response to treatment, I feel that the patent would benefit from outpatient management with PCP follow-up, neurosurgery follow-up. Sign out to [employer] PA pending PO challenge. If unable to tolerate PO, will need to consider admission for intractable vomiting  Final diagnoses:  Nausea vomiting and diarrhea  Compression fracture of L1 vertebra, initial encounter (HCC)  Esophageal thickening    ED Discharge Orders          Ordered  omeprazole (PRILOSEC) 20 MG capsule  Daily        08/13/24 2254

## 2024-08-14 LAB — RAPID URINE DRUG SCREEN, HOSP PERFORMED
Amphetamines: NOT DETECTED
Barbiturates: NOT DETECTED
Benzodiazepines: POSITIVE — AB
Cocaine: NOT DETECTED
Opiates: POSITIVE — AB
Tetrahydrocannabinol: NOT DETECTED

## 2024-08-14 LAB — URINALYSIS, ROUTINE W REFLEX MICROSCOPIC
Bilirubin Urine: NEGATIVE
Glucose, UA: NEGATIVE mg/dL
Hgb urine dipstick: NEGATIVE
Ketones, ur: 20 mg/dL — AB
Leukocytes,Ua: NEGATIVE
Nitrite: NEGATIVE
Protein, ur: NEGATIVE mg/dL
Specific Gravity, Urine: >1.046 — ABNORMAL HIGH (ref 1.005–1.030)
pH: 6 (ref 5.0–8.0)

## 2024-08-14 MED ORDER — HYDROCODONE-ACETAMINOPHEN 5-325 MG PO TABS: 1.0000 | ORAL_TABLET | ORAL | 0 refills | Status: DC | PRN

## 2024-08-14 MED ORDER — PROMETHAZINE HCL 25 MG PO TABS: 25.0000 mg | ORAL_TABLET | Freq: Four times a day (QID) | ORAL | 0 refills | Status: DC | PRN

## 2024-08-14 MED ORDER — MORPHINE SULFATE (PF) 2 MG/ML IV SOLN
2.0000 mg | Freq: Once | INTRAVENOUS | Status: AC
  Administered 2024-08-14: 2 mg via INTRAVENOUS
  Filled 2024-08-14: qty 1

## 2024-08-14 MED ORDER — HYDROCODONE-ACETAMINOPHEN 5-325 MG PO TABS
1.0000 | ORAL_TABLET | Freq: Once | ORAL | Status: AC
  Administered 2024-08-14: 1 via ORAL
  Filled 2024-08-14: qty 1

## 2024-08-14 NOTE — Progress Notes (Signed)
 Orthopedic Tech Progress Note Patient Details:  Carla Martinez 08/28/1961 [id number]  Patient ID: Carla Martinez, female   DOB: [birth year], 63 y.o.   MRN: [id number] I was called to apply the tlso for discharge. After opening it and presizing it I was told to wait because they werent going home until morning. So I left it in the room. Carla Martinez PARAS 08/14/2024, 3:15 AM

## 2024-08-14 NOTE — ED Provider Notes (Signed)
  Physical Exam  BP (!) 140/70   Pulse 89   Temp 99.5 F (37.5 C) (Oral)   Resp 20   Ht 5' 4 (1.626 m)   Wt 43.5 kg   SpO2 96%   BMI 16.48 kg/m   Physical Exam  Procedures  Procedures  ED Course / MDM    Medical Decision Making Amount and/or Complexity of Data Reviewed Labs: ordered. Radiology: ordered.  Risk OTC drugs. Prescription drug management.   Patient care assumed at shift handoff.  In short, patient is a 63 year old female with history significant of heart failure, NSTEMI, prolonged QT, alcohol abuse who presents complaining of vomiting diarrhea and nausea which has been ongoing throughout the day today.  She also had a recent fall and is complaining of low back pain.  Prior to my assumption of care workup is been completed showing 1. Acute superior endplate fracture of L1 with approximately 50% loss of  height, 4 mm retropulsion, mild focal kyphosis, and moderate paravertebral  infiltration/edema. No significant canal stenosis or definite canal hematoma.  2. Stable remote superior endplate fracture of L2 with approximately 4050%  central height loss. No associated retropulsion.  3. Distal esophageal wall thickening, nonspecific, possibly esophagitis (e.g.,  reflux). Consider endoscopy if clinically indicated.  4. Unchanged 6 mm hypodense lesion in the pancreatic uncinate process since  07/02/2021, likely tiny cyst or side-branch IPMN; pancreas otherwise  unremarkable. Follow-up MRI examination in 2 years to be helpful in confirming  5 years of stability and an underlying benign etiology.   Neurosurgery was consulted and recommended TLSO brace.  Patient being monitored for response to antiemetic medication.  If she is able to tolerate oral intake will discharge home with prescription for Phenergan  will have TLSO brace applied here in the emergency department  Patient reassessed after Phenergan  administration and is now tolerating oral intake.  She states  that her mother is at home and can help her with simple tasks.  She feels safe discharging home from an orthopedic standpoint.  TLSO brace ordered to be applied by [facility].  Patient will be sent prescription for Phenergan . Patient will follow up with neurosurgery.      Logan Ubaldo NOVAK, PA-C 08/14/24 0258    Theadore Ozell HERO, MD 08/14/24 [phone]

## 2024-08-14 NOTE — Progress Notes (Signed)
 Orthopedic Tech Progress Note Patient Details:  Carla Martinez 1961/02/27 [id number]  Patient ID: [name], female   DOB: 06-27-1961, 63 y.o.   MRN: [id number] I spoke with the Dr and Obie. They are still deciding on admit vs discharge. I will wait till that is decided for the tlso, for in house vs hanger brace. Chandra Dorn PARAS 08/14/2024, 12:51 AM

## 2024-08-14 NOTE — Progress Notes (Signed)
 Orthopedic Tech Progress Note Patient Details:  Carla Martinez 1961/02/16 [id number]  Ortho Devices Type of Ortho Device: Thoracolumbar corset (TLSO) Ortho Device/Splint Interventions: Ordered, Application, Adjustment  I was called back to apply tlso. Post Interventions Patient Tolerated: Well Instructions Provided: Care of device, Adjustment of device  Chandra Dorn PARAS 08/14/2024, 4:19 AM

## 2024-08-15 ENCOUNTER — Other Ambulatory Visit: Payer: Self-pay | Admitting: Nurse Practitioner

## 2024-08-15 ENCOUNTER — Other Ambulatory Visit (HOSPITAL_COMMUNITY): Payer: Self-pay | Admitting: Physician Assistant

## 2024-08-15 DIAGNOSIS — G4700 Insomnia, unspecified: Secondary | ICD-10-CM

## 2024-08-15 DIAGNOSIS — J90 Pleural effusion, not elsewhere classified: Secondary | ICD-10-CM

## 2024-08-15 DIAGNOSIS — E876 Hypokalemia: Secondary | ICD-10-CM

## 2024-08-15 DIAGNOSIS — R7989 Other specified abnormal findings of blood chemistry: Secondary | ICD-10-CM

## 2024-08-16 ENCOUNTER — Ambulatory Visit: Payer: Self-pay

## 2024-08-16 ENCOUNTER — Ambulatory Visit (INDEPENDENT_AMBULATORY_CARE_PROVIDER_SITE_OTHER)

## 2024-08-16 VITALS — BP 106/67 | HR 74 | Resp 16 | Ht 63.0 in | Wt 96.1 lb

## 2024-08-16 DIAGNOSIS — S32010S Wedge compression fracture of first lumbar vertebra, sequela: Secondary | ICD-10-CM

## 2024-08-16 MED ORDER — OXYCODONE HCL 10 MG PO TABS: 10.0000 mg | ORAL_TABLET | Freq: Four times a day (QID) | ORAL | 0 refills | Status: AC | PRN

## 2024-08-16 MED ORDER — NAPROXEN 500 MG PO TABS: 500.0000 mg | ORAL_TABLET | Freq: Two times a day (BID) | ORAL | 0 refills | Status: AC

## 2024-08-16 NOTE — Progress Notes (Signed)
 Acute visit   Patient: Carla Martinez   DOB: Sep 13, 1961   63 y.o. Female  MRN: [id number] PCP: Wendee Lynwood HERO, NP   Chief Complaint  Patient presents with   Acute Visit    Back pain several wks with nausea   Subjective    Discussed the use of AI scribe software for clinical note transcription with the patient, who gave verbal consent to proceed.  History of Present Illness Carla Martinez is a 63 year old female who presents with severe back pain following a fall.  She experiences excruciating back pain that has incapacitated her, making it difficult to care for her 70 year old nonverbal son and requiring assistance from her 35 year old mother. The pain is severe and unrelenting.  The back pain began following a fall that occurred prior to her visit to her primary care physician on August 09, 2024. She subsequently visited the emergency room due to nausea and was provided with a TLSO brace, which she has not been wearing due to discomfort and lack of perceived benefit. She reports that the brace 'didn't help' and describes difficulty in using it due to its design.  She has a history of multiple spinal fractures, including a recent L1 fracture and other fractures that have not healed. She has been taking hydrocodone  every six hours for pain, but it is not effective, stating 'it's not touching it.' She is unsure of the exact dosage but believes she was prescribed 10 mg every six hours. She has also tried Pain Quill without relief.  She has been using frozen vegetables and heat for pain relief and has tried Salonpas patches applied by her mother, but these have not provided significant relief. She feels nauseous and has had difficulty eating, only consuming half a protein shake and some Diet Coke, which has not settled well.  She has a caregiver who assists a couple of days a week, but her mother has been helping more due to her incapacitation. She is concerned about  her mother's involvement in caregiving due to her age.  She lives with her 58 year old nonverbal son and her 36 year old mother. She is not originally from the area and is dissatisfied with the location of her pharmacy.  Review of systems as noted in HPI.   Objective    BP 106/67 (BP Location: Right Arm, Patient Position: Sitting, Cuff Size: Small)   Pulse 74   Resp 16   Ht 5' 3 (1.6 m)   Wt 96 lb 1.6 oz (43.6 kg)   SpO2 98%   BMI 17.02 kg/m  Physical Exam Constitutional:      Appearance: Normal appearance.  HENT:     Head: Normocephalic and atraumatic.     Mouth/Throat:     Mouth: Mucous membranes are moist.  Eyes:     Pupils: Pupils are equal, round, and reactive to light.  Pulmonary:     Effort: Pulmonary effort is normal.  Musculoskeletal:     Lumbar back: Bony tenderness present.  Skin:    General: Skin is warm.  Neurological:     General: No focal deficit present.     Mental Status: She is alert.  Psychiatric:        Mood and Affect: Affect is tearful.       No results found for any visits on 08/16/24.  Assessment & Plan     Problem List Items Addressed This Visit   None Visit Diagnoses  Closed compression fracture of L1 vertebra, sequela    -  Primary   Relevant Medications   naproxen (NAPROSYN) 500 MG tablet   Oxycodone  HCl 10 MG TABS   Other Relevant Orders   Ambulatory referral to Neurosurgery      Assessment & Plan Lumbar vertebral compression fracture (L1) with severe pain Recent acute L1 compression fracture with 40% vertebral body height loss, here with severe pain. Current pain management (hydrocodone -acetaminophen  10-325mg  every 6 hours) is inadequate. TLSO brace given in the ED is ineffective. Pain limits patient's care giving abilities. Immediate focus on multi-modal pain control. - Prescribe oxycodone  10 mg every 6 hours as needed for severe pain, up to 5 days. Discontinue hydrocodone . PDMP reviewed and appropriate. - Prescribe  naproxen twice daily for 7 days. - Refer to neurosurgery for urgent fracture evaluation - Advise use of lidocaine  patches, cold, and heat therapy. - Instruct to seek emergency care if pain worsens, or she is unable to eat/drink >24 hours.  I personally spent a total of 30 minutes in the care of the patient today including getting/reviewing separately obtained history, performing a medically appropriate exam/evaluation, counseling and educating, referring and communicating with other health care professionals, communicating results, and coordinating care.   Meds ordered this encounter  Medications   naproxen (NAPROSYN) 500 MG tablet    Sig: Take 1 tablet (500 mg total) by mouth 2 (two) times daily with a meal for 7 days.    Dispense:  14 tablet    Refill:  0   Oxycodone  HCl 10 MG TABS    Sig: Take 1 tablet (10 mg total) by mouth every 6 (six) hours as needed for up to 5 days (severe pain).    Dispense:  20 tablet    Refill:  0     No follow-ups on file.      Isaiah DELENA Pepper, MD  [facility] [phone] (phone) [phone] (fax)

## 2024-08-16 NOTE — Telephone Encounter (Signed)
 FYI Only or Action Required?: FYI only for provider.  Patient was last seen in primary care on 08/09/2024 by Wendee Lynwood HERO, NP.  Called Nurse Triage reporting Back Pain.  Symptoms began ongoing and worsening .  Interventions attempted: Prescription medications: pain medication not working.  Symptoms are: gradually worsening.  Triage Disposition: See HCP Within 4 Hours (Or PCP Triage)  Patient/caregiver understands and will follow disposition?: Yes   Copied from CRM [id number]. Topic: Clinical - Red Word Triage >> Aug 16, 2024 11:32 AM Suzen RAMAN wrote: Red Word that prompted transfer to Nurse Triage:   previous falll broke L1.. back pain.. cant bare down for bowel moment due to back pain.. medication prescribed is not working. would like recommendation on what else she can do Reason for Disposition  [1] SEVERE back pain (e.g., excruciating, unable to do any normal activities) AND [2] not improved 2 hours after pain medicine  Answer Assessment - Initial Assessment Questions 1. ONSET: When did the pain begin? (e.g., minutes, hours, days)     Since fall and worsening  2. LOCATION: Where does it hurt? (upper, mid or lower back)     Lower to mid back pain 3. SEVERITY: How bad is the pain?  (e.g., Scale 1-10; mild, moderate, or severe)     Extreme back pain 4. PATTERN: Is the pain constant? (e.g., yes, no; constant, intermittent)      constant 5. RADIATION: Does the pain shoot into your legs or somewhere else?     na 6. CAUSE:  What do you think is causing the back pain?      Fall broke L1 7. BACK OVERUSE:  Any recent lifting of heavy objects, strenuous work or exercise?     na 8. MEDICINES: What have you taken so far for the pain? (e.g., nothing, acetaminophen , NSAIDS)     Pain medication not helping 9. NEUROLOGIC SYMPTOMS: Do you have any weakness, numbness, or problems with bowel/bladder control?     no 10. OTHER SYMPTOMS: Do you have any other symptoms?  (e.g., fever, abdomen pain, burning with urination, blood in urine)       no 11. PREGNANCY: Is there any chance you are pregnant? When was your last menstrual period?       Na  Pt stated not able to push/press down when having a bowel movement due to pain or care for disabled son  Protocols used: Back Pain-A-AH

## 2024-08-16 NOTE — Telephone Encounter (Signed)
 I appreciate Dr. Franchot seeing her. I had referred her to ortho because of the compression fracture. Has she seen them yet?

## 2024-08-17 ENCOUNTER — Telehealth: Payer: Self-pay

## 2024-08-17 NOTE — Telephone Encounter (Signed)
See most recent office visit.

## 2024-08-17 NOTE — Telephone Encounter (Signed)
 This was incorrectly routed to us  Thank you     Copied from CRM [id number]. Topic: Clinical - Order For Equipment >> Aug 16, 2024  3:25 PM Winona R wrote: Pt would like to know if she can have a order for a back brace, with hopes that her insurance will cover it

## 2024-08-18 ENCOUNTER — Ambulatory Visit (HOSPITAL_COMMUNITY): Admitting: Licensed Clinical Social Worker

## 2024-08-20 ENCOUNTER — Emergency Department (HOSPITAL_COMMUNITY)
Admission: EM | Admit: 2024-08-20 | Discharge: 2024-08-21 | Disposition: A | Discharge status: Home / Self Care | Source: Other Acute Inpatient Hospital | Attending: Emergency Medicine | Admitting: Emergency Medicine

## 2024-08-20 ENCOUNTER — Encounter (HOSPITAL_COMMUNITY): Payer: Self-pay

## 2024-08-20 ENCOUNTER — Ambulatory Visit (HOSPITAL_COMMUNITY)
Admission: EM | Admit: 2024-08-20 | Discharge: 2024-08-20 | Disposition: A | Discharge status: Other Acute Inpatient Hospital

## 2024-08-20 ENCOUNTER — Other Ambulatory Visit: Payer: Self-pay

## 2024-08-20 DIAGNOSIS — F1010 Alcohol abuse, uncomplicated: Secondary | ICD-10-CM

## 2024-08-20 DIAGNOSIS — F32A Depression, unspecified: Secondary | ICD-10-CM | POA: Diagnosis not present

## 2024-08-20 DIAGNOSIS — Y900 Blood alcohol level of less than 20 mg/100 ml: Secondary | ICD-10-CM | POA: Insufficient documentation

## 2024-08-20 DIAGNOSIS — F1020 Alcohol dependence, uncomplicated: Secondary | ICD-10-CM | POA: Diagnosis present

## 2024-08-20 LAB — CBC WITH DIFFERENTIAL/PLATELET
Abs Immature Granulocytes: 0.03 K/uL (ref 0.00–0.07)
Basophils Absolute: 0 K/uL (ref 0.0–0.1)
Basophils Relative: 0 %
Eosinophils Absolute: 0.2 K/uL (ref 0.0–0.5)
Eosinophils Relative: 3 %
HCT: 34.1 % — ABNORMAL LOW (ref 36.0–46.0)
Hemoglobin: 10.8 g/dL — ABNORMAL LOW (ref 12.0–15.0)
Immature Granulocytes: 0 %
Lymphocytes Relative: 34 %
Lymphs Abs: 2.6 K/uL (ref 0.7–4.0)
MCH: 28 pg (ref 26.0–34.0)
MCHC: 31.7 g/dL (ref 30.0–36.0)
MCV: 88.3 fL (ref 80.0–100.0)
Monocytes Absolute: 0.6 K/uL (ref 0.1–1.0)
Monocytes Relative: 8 %
Neutro Abs: 4.3 K/uL (ref 1.7–7.7)
Neutrophils Relative %: 55 %
Platelets: 415 K/uL — ABNORMAL HIGH (ref 150–400)
RBC: 3.86 MIL/uL — ABNORMAL LOW (ref 3.87–5.11)
RDW: 14.1 % (ref 11.5–15.5)
WBC: 7.7 K/uL (ref 4.0–10.5)
nRBC: 0 % (ref 0.0–0.2)

## 2024-08-20 LAB — COMPREHENSIVE METABOLIC PANEL WITH GFR
ALT: 19 U/L (ref 0–44)
AST: 21 U/L (ref 15–41)
Albumin: 3.3 g/dL — ABNORMAL LOW (ref 3.5–5.0)
Alkaline Phosphatase: 101 U/L (ref 38–126)
Anion gap: 12 (ref 5–15)
BUN: 14 mg/dL (ref 6–20)
CO2: 25 mmol/L (ref 22–32)
Calcium: 9.2 mg/dL (ref 8.9–10.3)
Chloride: 100 mmol/L (ref 98–111)
Creatinine, Ser: 1.21 mg/dL — ABNORMAL HIGH (ref 0.44–1.00)
GFR, Estimated: 51 mL/min — ABNORMAL LOW (ref >60)
Glucose, Bld: 113 mg/dL — ABNORMAL HIGH (ref 70–99)
Potassium: 3.8 mmol/L (ref 3.5–5.1)
Sodium: 137 mmol/L (ref 135–145)
Total Bilirubin: 0.6 mg/dL (ref 0.0–1.2)
Total Protein: 6.1 g/dL — ABNORMAL LOW (ref 6.5–8.1)

## 2024-08-20 LAB — RAPID URINE DRUG SCREEN, HOSP PERFORMED
Amphetamines: NOT DETECTED
Barbiturates: NOT DETECTED
Benzodiazepines: NOT DETECTED
Cocaine: NOT DETECTED
Opiates: POSITIVE — AB
Tetrahydrocannabinol: NOT DETECTED

## 2024-08-20 LAB — ETHANOL: Alcohol, Ethyl (B): <15 mg/dL (ref <15)

## 2024-08-20 MED ORDER — LORAZEPAM 1 MG PO TABS
0.0000 mg | ORAL_TABLET | Freq: Four times a day (QID) | ORAL | Status: DC
  Administered 2024-08-21: 2 mg via ORAL
  Administered 2024-08-21: 1 mg via ORAL
  Filled 2024-08-20: qty 2
  Filled 2024-08-20: qty 1

## 2024-08-20 MED ORDER — CELECOXIB 200 MG PO CAPS
200.0000 mg | ORAL_CAPSULE | Freq: Every day | ORAL | Status: DC
Start: 2024-08-21 — End: 2024-08-21
  Administered 2024-08-21: 200 mg via ORAL
  Filled 2024-08-20: qty 1

## 2024-08-20 MED ORDER — THIAMINE MONONITRATE 100 MG PO TABS
100.0000 mg | ORAL_TABLET | Freq: Every day | ORAL | Status: DC
  Administered 2024-08-20 – 2024-08-21 (×2): 100 mg via ORAL
  Filled 2024-08-20 (×2): qty 1

## 2024-08-20 MED ORDER — LORAZEPAM 2 MG/ML IJ SOLN: 0.0000 mg | Freq: Four times a day (QID) | INTRAMUSCULAR | Status: DC

## 2024-08-20 MED ORDER — HYDROXYZINE HCL 50 MG PO TABS: 50.0000 mg | ORAL_TABLET | Freq: Three times a day (TID) | ORAL | Status: DC | PRN

## 2024-08-20 MED ORDER — FLUOXETINE HCL 20 MG PO CAPS
60.0000 mg | ORAL_CAPSULE | Freq: Every day | ORAL | Status: DC
  Administered 2024-08-21: 60 mg via ORAL
  Filled 2024-08-20: qty 3

## 2024-08-20 MED ORDER — GABAPENTIN 300 MG PO CAPS
300.0000 mg | ORAL_CAPSULE | Freq: Three times a day (TID) | ORAL | Status: DC
  Administered 2024-08-21 (×2): 300 mg via ORAL
  Filled 2024-08-20 (×2): qty 1

## 2024-08-20 MED ORDER — LORAZEPAM 1 MG PO TABS: 0.0000 mg | ORAL_TABLET | Freq: Two times a day (BID) | ORAL | Status: DC

## 2024-08-20 MED ORDER — THIAMINE HCL 100 MG/ML IJ SOLN
100.0000 mg | Freq: Every day | INTRAMUSCULAR | Status: DC
Start: 2024-08-20 — End: 2024-08-21

## 2024-08-20 MED ORDER — LORAZEPAM 1 MG PO TABS
1.0000 mg | ORAL_TABLET | Freq: Once | ORAL | Status: AC
  Administered 2024-08-20: 1 mg via ORAL
  Filled 2024-08-20: qty 1

## 2024-08-20 MED ORDER — FUROSEMIDE 20 MG PO TABS
20.0000 mg | ORAL_TABLET | Freq: Every day | ORAL | Status: DC
  Administered 2024-08-21: 20 mg via ORAL
  Filled 2024-08-20: qty 1

## 2024-08-20 MED ORDER — LORAZEPAM 2 MG/ML IJ SOLN: 0.0000 mg | Freq: Two times a day (BID) | INTRAMUSCULAR | Status: DC

## 2024-08-20 NOTE — ED Provider Notes (Signed)
 Carla Martinez   CSN: [id number] Arrival date & time: 08/20/24  2002     Patient presents with: Alcohol Intoxication   Carla Martinez is a 63 y.o. female.   Patient here with medical clearance from psychiatry.  History of alcohol abuse.  She is having anxiety depression.  She has been heavily drinking the last 2 days but has not really had much alcohol prior to the last 2 days.  She is having some anxiety symptoms.  Maybe some tremors.  But she denies any chest pain shortness of breath fever chills.  Patient has history of anxiety paroxysmal A-fib alcohol use disorder seizure history from alcohol withdrawal in the past.  The history is provided by the patient.       Prior to Admission medications   Medication Sig Start Date End Date Taking? Authorizing Provider  acamprosate  (CAMPRAL ) 333 MG tablet Take 2 tablets (666 mg total) by mouth 3 (three) times daily with meals. 05/26/24  Yes Nwoko, Uchenna E, PA  Biotin  5000 MCG CAPS Take 5,000 mcg by mouth in the morning.   Yes [provider]  calcium  carbonate (OS-CAL - DOSED IN MG OF ELEMENTAL CALCIUM ) 1250 (500 Ca) MG tablet Take 1 tablet by mouth daily with breakfast.   Yes [provider]  celecoxib (CELEBREX) 200 MG capsule Take 200 mg by mouth in the morning. 08/20/24  Yes [provider]  cyanocobalamin  (VITAMIN B12) 500 MCG tablet Take 1 tablet (500 mcg total) by mouth daily. Patient taking differently: Take 500 mcg by mouth in the morning. 02/24/24  Yes Akula, Vijaya, MD  FLUoxetine  (PROZAC ) 20 MG capsule Take 3 capsules (60 mg total) by mouth daily. 07/13/24 07/13/25 Yes Nwoko, Uchenna E, PA  furosemide  (LASIX ) 20 MG tablet Take 1 tablet (20 mg total) by mouth daily. 08/03/24  Yes Wendee Lynwood HERO, NP  gabapentin  (NEURONTIN ) 300 MG capsule Take 1 capsule (300 mg total) by mouth 3 (three) times daily. 07/13/24  Yes Nwoko, Uchenna E, PA   HYDROcodone -acetaminophen  (NORCO) 10-325 MG tablet Take 1 tablet by mouth every 6 (six) hours as needed for moderate pain (pain score 4-6).   Yes [provider]  HYDROcodone -acetaminophen  (NORCO/VICODIN) 5-325 MG tablet Take 1 tablet by mouth every 6 (six) hours as needed for moderate pain (pain score 4-6).   Yes [provider]  hydrOXYzine  (ATARAX ) 50 MG tablet Take 2 tablets (100 mg total) by mouth 3 (three) times daily as needed. Patient taking differently: Take 100 mg by mouth in the morning, at noon, and at bedtime. 07/13/24  Yes Nwoko, Uchenna E, PA  magnesium  oxide (MAG-OX) 400 (240 Mg) MG tablet Take 400 mg by mouth daily at 12 noon.   Yes [provider]  melatonin 3 MG TABS tablet Take 6 mg by mouth. 04/22/24  Yes [provider]  metoprolol  succinate (TOPROL  XL) 25 MG 24 hr tablet Take 0.5 tablets (12.5 mg total) by mouth daily. Patient taking differently: Take 12.5 mg by mouth in the morning. 07/27/24 07/27/25 Yes Hammock, Tylene, NP  Naltrexone  (VIVITROL ) 380 MG SUSR Inject 380 mg into the muscle every 28 (twenty-eight) days. 03/14/24  Yes Nwoko, Uchenna E, PA  naproxen (NAPROSYN) 500 MG tablet Take 1 tablet (500 mg total) by mouth 2 (two) times daily with a meal for 7 days. 08/16/24 08/23/24 Yes Franchot Isaiah LABOR, MD  omeprazole (PRILOSEC) 20 MG capsule Take 1 capsule (20 mg total) by mouth daily.  Patient taking differently: Take 20 mg by mouth daily at 12 noon. 08/13/24 09/12/24 Yes Minnie Tinnie BRAVO, PA  Oxycodone  HCl 10 MG TABS Take 1 tablet (10 mg total) by mouth every 6 (six) hours as needed for up to 5 days (severe pain). 08/16/24 08/21/24 Yes Franchot Isaiah LABOR, MD  pantoprazole  (PROTONIX ) 40 MG tablet Take 1 tablet (40 mg total) by mouth daily. Patient taking differently: Take 40 mg by mouth daily at 12 noon. 08/03/24  Yes Wendee Lynwood HERO, NP  potassium chloride  SA (KLOR-CON  M) 20 MEQ tablet Take 1 tablet (20 mEq total) by mouth daily. 08/03/24  Yes  Wendee Lynwood HERO, NP  promethazine  (PHENERGAN ) 25 MG tablet Take 1 tablet (25 mg total) by mouth every 6 (six) hours as needed for nausea or vomiting. 08/14/24  Yes Logan Ubaldo NOVAK, PA-C  QUEtiapine  (SEROQUEL ) 100 MG tablet Take 1 tablet (100 mg total) by mouth 2 (two) times daily. 07/14/24  Yes Nwoko, Uchenna E, PA  QUEtiapine  (SEROQUEL ) 400 MG tablet Take 1 tablet (400 mg total) by mouth at bedtime. 07/13/24  Yes Nwoko, Uchenna E, PA  trazodone  (DESYREL ) 300 MG tablet Take 1 tablet (300 mg total) by mouth at bedtime. 07/13/24  Yes Nwoko, Uchenna E, PA    Allergies: Patient has no known allergies.    Review of Systems  Updated Vital Signs BP 108/61   Pulse (!) 54   Temp (!) 97.5 F (36.4 C) (Oral)   Resp 16   Ht 5' 3 (1.6 m)   Wt 44 kg   SpO2 96%   BMI 17.18 kg/m   Physical Exam Vitals and nursing Martinez reviewed.  Constitutional:      General: She is not in acute distress.    Appearance: She is well-developed. She is not ill-appearing.  HENT:     Head: Normocephalic and atraumatic.     Nose: Nose normal.     Mouth/Throat:     Mouth: Mucous membranes are moist.  Eyes:     Extraocular Movements: Extraocular movements intact.     Conjunctiva/sclera: Conjunctivae normal.     Pupils: Pupils are equal, round, and reactive to light.  Cardiovascular:     Rate and Rhythm: Normal rate and regular rhythm.     Pulses: Normal pulses.     Heart sounds: Normal heart sounds. No murmur heard. Pulmonary:     Effort: Pulmonary effort is normal. No respiratory distress.     Breath sounds: Normal breath sounds.  Abdominal:     Palpations: Abdomen is soft.     Tenderness: There is no abdominal tenderness.  Musculoskeletal:        General: No swelling. Normal range of motion.     Cervical back: Normal range of motion and neck supple.  Skin:    General: Skin is warm and dry.     Capillary Refill: Capillary refill takes less than 2 seconds.  Neurological:     General: No focal deficit  present.     Mental Status: She is alert and oriented to person, place, and time.     Cranial Nerves: No cranial nerve deficit.     Sensory: No sensory deficit.     Motor: No weakness.     Coordination: Coordination normal.     Comments: 5+ out of 5 strength, normal sensation, no drift, normal finger-nose-finger, normal speech  Psychiatric:        Mood and Affect: Mood normal.     (all labs ordered are listed,  but only abnormal results are displayed) Labs Reviewed  COMPREHENSIVE METABOLIC PANEL WITH GFR - Abnormal; Notable for the following components:      Result Value   Glucose, Bld 113 (*)    Creatinine, Ser 1.21 (*)    Total Protein 6.1 (*)    Albumin  3.3 (*)    GFR, Estimated 51 (*)    All other components within normal limits  RAPID URINE DRUG SCREEN, HOSP PERFORMED - Abnormal; Notable for the following components:   Opiates POSITIVE (*)    All other components within normal limits  CBC WITH DIFFERENTIAL/PLATELET - Abnormal; Notable for the following components:   RBC 3.86 (*)    Hemoglobin 10.8 (*)    HCT 34.1 (*)    Platelets 415 (*)    All other components within normal limits  ETHANOL    EKG: EKG Interpretation Date/Time:  Saturday August 20 2024 21:30:23 EDT Ventricular Rate:  53 PR Interval:  166 QRS Duration:  94 QT Interval:  528 QTC Calculation: 496 R Axis:   73  Text Interpretation: Sinus rhythm Low voltage, precordial leads Confirmed by Ruthe Cornet [phone]) on 08/20/2024 10:41:48 PM  Radiology: No results found.   Procedures   Medications Ordered in the ED  LORazepam  (ATIVAN ) injection 0-4 mg ( Intravenous Not Given 08/20/24 2054)    Or  LORazepam  (ATIVAN ) tablet 0-4 mg ( Oral See Alternative 08/20/24 2054)  LORazepam  (ATIVAN ) injection 0-4 mg (has no administration in time range)    Or  LORazepam  (ATIVAN ) tablet 0-4 mg (has no administration in time range)  thiamine  (VITAMIN B1) tablet 100 mg (100 mg Oral Given 08/20/24 2044)    Or   thiamine  (VITAMIN B1) injection 100 mg ( Intravenous See Alternative 08/20/24 2044)  LORazepam  (ATIVAN ) tablet 1 mg (1 mg Oral Given 08/20/24 2044)                                    Medical Decision Making Amount and/or Complexity of Data Reviewed Labs: ordered.  Risk OTC drugs. Prescription drug management.   Carla Martinez is here for medical clearance and Geri psych placement.  Behavioral health concern for may be alcohol detox but her CIWA score is 5 she has no tremors no tachycardia.  She is overall well-appearing per my review of the patient.  Medical clearance labs are unremarkable.  Overall she is medically cleared at this time.  Is not exhibiting any signs of alcohol withdrawal.  She drank heavily the last 2 days but has not really drank prior to these last 2 days.  Ultimately lab work is reassuring.  EKG was unremarkable.  I reviewed interpreted labs and imaging.  Patient medically cleared awaiting psychiatric placement.  This chart was dictated using voice recognition software.  Despite best efforts to proofread,  errors can occur which can change the documentation meaning.      Final diagnoses:  Alcohol abuse  Depression, unspecified depression type    ED Discharge Orders     None          Ruthe Cornet, DO 08/20/24 2327

## 2024-08-20 NOTE — ED Triage Notes (Signed)
 Pt bib EMS from [facility]. Pt went to [facility] at 2pm and was too high acuity and told to come to ER. Pt was altered, weak. Hx of seizures. No seizure activity reported.   Per EMS, patient A&Ox4. Currently in alcohol withdrawal (went to [facility] for detox, has been there before). Reported drinking Thursday and Friday, last drink was midnight on Friday night. PT states she doesn't drink daily but her daughter passed away recently which triggered the drinking.   Pt reports rod in right femur and has fractured L1 vertebrae (can't recall why). NAD noted in triage.   64HR, 106/68, 95% RA, CBG 122

## 2024-08-20 NOTE — Progress Notes (Signed)
   08/20/24 1530  [facility] Triage Screening (Walk-ins at [facility] only)  How Did You Hear About Us ? Self  What Is the Reason for Your Visit/Call Today? Patient is a 63 year old female that presents this date as a voluntary walk in brought in by a friend requesting assistance with ongoing alcohol issues. Patient has a PMHx significant for MDD and alcohol abuse. Per chart review patient was seen on 4/4 when she presented to [facility] with similar symptoms and was admitted to [facility]. Patient was discharged on 4/8 and returned 4/28 for her second admission. Patient denies any SI, HI or AVH although renders a confusing history this date stating she is her at the advice of her attorney due to a upcoming court date later this month for his first appearance on a DUI. Patient states she had been maintaining her sobriety until two days ago when she relapsed on alcohol. Patient states for the last two days she has been drinking up to, 6 mini bottles of wine, each day. Patient also reports she has been having, seizure like spells, and cannot ambulate because her, legs buckle. Patient is requesting assistance with possibly detoxing from alcohol.  How Long Has This Been Causing You Problems? 1-6 months  Have You Recently Had Any Thoughts About Hurting Yourself? No  Are You Planning to Commit Suicide/Harm Yourself At This time? No  Have you Recently Had Thoughts About Hurting Someone Sherral? No  Are You Planning To Harm Someone At This Time? No  Physical Abuse Denies  Verbal Abuse Denies  Sexual Abuse Denies  Exploitation of patient/patient's resources Denies  Self-Neglect Denies  Possible abuse reported to: Other (Comment) (NA)  Are you currently experiencing any auditory, visual or other hallucinations? No  Have You Used Any Alcohol or Drugs in the Past 24 Hours? Yes  What Did You Use and How Much? Patient states they had 6 mini bottles of alcohol last night  Do you have any current medical co-morbidities that require  immediate attention? No  Clinician description of patient physical appearance/behavior: Patient is cooperative  What Do You Feel Would Help You the Most Today? Alcohol or Drug Use Treatment  If access to [facility] was not available, would you have sought care in the Emergency Department? No  Determination of Need Routine (7 days)  Options For Referral Other: Comment (To be determined)

## 2024-08-20 NOTE — ED Provider Notes (Signed)
 Behavioral Health Urgent Care Medical Screening Exam  Patient Name: Carla Martinez MRN: [id number] Date of Evaluation: 08/20/24 Chief Complaint:   Diagnosis:  Final diagnoses:  Alcohol use disorder, severe, dependence (HCC)    History of Present illness: Carla Martinez is a 63 y.o. female.  Per triage:  ARNA SHONA MUSSEL: Patient is a 63 year old female that presents this date as a voluntary walk in brought in by a friend requesting assistance with ongoing alcohol issues. Patient has a PMHx significant for MDD and alcohol abuse. Per chart review patient was seen on 4/4 when she presented to [facility] with similar symptoms and was admitted to [facility]. Patient was discharged on 4/8 and returned 4/28 for her second admission. Patient denies any SI, HI or AVH although renders a confusing history this date stating she is her at the advice of her attorney due to a upcoming court date later this month for his first appearance on a DUI. Patient states she had been maintaining her sobriety until two days ago when she relapsed on alcohol. Patient states for the last two days she has been drinking up to, 6 mini bottles of wine, each day. Patient also reports she has been having, seizure like spells, and cannot ambulate because her, legs buckle. Patient is requesting assistance with possibly detoxing from alcohol.    Patient is evaluated face-to-face by this provider, consulted with Dr Lawrnce, MD. 64 year-old female sitting  in a wheelchair. Appears older than her stated age. Disheveled, unkept. Anxious and sad. She is alert and oriented x 4. Her though process is coherent. She is restless and hopeless. She denies SI/HI/AVH and does not appear to be preoccupied, or responding to internal stimuli. Patient reports that she is experiencing withdrawal symptoms related to alcohol abuse. Reports that yesterday, she had many bottles of wine. Reports having body aching, tremors, insomnia and  restlessness. Patient reports that she has been experiencing seizure-like symptoms and unable to describe. States she recently had a DUI and has charges. States she has been feeling overwhelmed and feeling guilty because my mom is 56 and looks younger than me. States she has a son with Down syndrome at home and she might lose his custody due to her alcohol problem. She also has began to experience memory problems. Last use was last night. Reports she is looking for substance abuse services to become sober and take care of her family.   Patient presents with unsteady gait along with withdrawal symptoms. She is in a wheelchair and reports chronic back pain. She reports seizure-like symptoms and increased tremors. We are recommending medical clearance, followed by placement into a psychiatric service for treatment.  Patient to be transferred to [facility] ED. Report given to Dr Ruthe, MD.   Flowsheet Row ED from 08/20/2024 in [facility] ED from 08/13/2024 in [facility] Emergency Department at [facility] Video Visit from 07/13/2024 in [facility]  C-SSRS RISK CATEGORY No Risk No Risk Low Risk    Psychiatric Specialty Exam  Presentation  General Appearance:Disheveled  Eye Contact:Fair  Speech:Garbled  Speech Volume:Normal  Handedness:Right   Mood and Affect  Mood: Anxious; Depressed  Affect: Depressed; Tearful   Thought Process  Thought Processes: Coherent  Descriptions of Associations:Intact  Orientation:Full (Time, Place and Person)  Thought Content:WDL  Diagnosis of Schizophrenia or Schizoaffective disorder in past: No  Duration of Psychotic Symptoms: No data recorded Hallucinations:None  Ideas of Reference:None  Suicidal Thoughts:No  Homicidal Thoughts:No   Sensorium  Memory: Immediate Fair; Recent Fair; Remote Fair  Judgment: Fair  Insight: Fair   Producer, Television/film/video: Fair  Attention Span: Fair  Recall: [employer] of Knowledge: Fair  Language: Fair   Psychomotor Activity  Psychomotor Activity: Restlessness   Assets  Assets: Manufacturing Systems Engineer; Desire for Improvement   Sleep  Sleep: Poor  Number of hours:  2   Physical Exam: Physical Exam Nursing note reviewed.  Constitutional:      Appearance: She is ill-appearing.  HENT:     Head: Normocephalic and atraumatic.     Right Ear: Tympanic membrane normal.     Left Ear: Tympanic membrane normal.     Nose: Nose normal.     Mouth/Throat:     Mouth: Mucous membranes are dry.  Eyes:     Extraocular Movements: Extraocular movements intact.     Pupils: Pupils are equal, round, and reactive to light.  Neurological:     General: No focal deficit present.     Mental Status: She is alert and oriented to person, place, and time.    Review of Systems  Constitutional:  Positive for weight loss.  HENT: Negative.    Eyes: Negative.   Respiratory: Negative.    Cardiovascular: Negative.   Gastrointestinal: Negative.   Genitourinary: Negative.   Musculoskeletal:  Positive for back pain.  Skin: Negative.   Neurological:  Positive for tremors, weakness and headaches.  Endo/Heme/Allergies: Negative.   Psychiatric/Behavioral:  Positive for depression and substance abuse. The patient is nervous/anxious and has insomnia.    There were no vitals taken for this visit. There is no height or weight on file to calculate BMI.  Musculoskeletal: Strength & Muscle Tone: decreased Gait & Station: unsteady Patient leans: generalized weakness   [facility] MSE Discharge Disposition for Follow up and Recommendations: Based on my evaluation the patient appears to have an emergency medical condition for which I recommend the patient be transferred to the emergency department for further evaluation.  We recommend inpatient  psychiatric placement upon medical clearance.  Randall Bouquet, NP 08/20/2024, 4:47 PM

## 2024-08-20 NOTE — ED Notes (Signed)
 Spoke with RN at [facility]

## 2024-08-20 NOTE — ED Notes (Signed)
 Call to [facility]
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
## 2024-08-21 ENCOUNTER — Encounter (HOSPITAL_COMMUNITY): Payer: Self-pay | Admitting: Psychiatry

## 2024-08-21 DIAGNOSIS — F1020 Alcohol dependence, uncomplicated: Secondary | ICD-10-CM

## 2024-08-21 MED ORDER — HYDROCODONE-ACETAMINOPHEN 5-325 MG PO TABS
1.0000 | ORAL_TABLET | Freq: Four times a day (QID) | ORAL | Status: DC | PRN
  Administered 2024-08-21: 1 via ORAL
  Filled 2024-08-21: qty 1

## 2024-08-21 MED ORDER — MELATONIN 3 MG PO TABS: 6.0000 mg | ORAL_TABLET | Freq: Every evening | ORAL | Status: DC | PRN

## 2024-08-21 NOTE — Progress Notes (Signed)
 Patient has been denied by [facility] due to no appropriate beds available. Patient meets BH inpatient criteria per Starlyn Patron, NP. Patient has been faxed out to the following facilities:  [facility]  [address], [place] [address] [phone]  [facility]  [address], [place] [address] [phone]  [facility]  [address] [place], [place] [address] [phone]  [facility] Patient Placement  [facility], [place] [phone]  [facility]  [address] [place] [address] [phone]  [facility]  [address] [place] [address] [phone]  [facility]  [address] [place] [address] [phone]  [facility]  [address] [place] [address] [phone]  [facility]  [address] [place], [place] [address] [phone]  [facility]  [address] [place] [address] [phone]  [facility]  [address] [place] [address] [phone]  [facility]  [address] [place] [address] [phone]  [facility]  [address] [place] [address] [phone]  [facility]  [address] [place] [address] [phone]   Bunnie Gallop, MSW, LCSW-A  10:29 AM 08/21/2024
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
## 2024-08-21 NOTE — ED Notes (Signed)
 While obtaining vital signs, pt was found to have vape with her in bed. Vape taken from patient and placed with the rest of her belongings in locker #5. Security in unit wanded patient at this time.

## 2024-08-21 NOTE — ED Notes (Signed)
 All belongings returned to patient. AVS provided to and discussed with patient. Pt verbalizes understanding of discharge instructions and denies any questions or concerns at this time. Pt calling her mother for ride home. Pt ambulated out of department independently with steady gait.

## 2024-08-21 NOTE — ED Provider Notes (Signed)
  Physical Exam  BP 123/72 (BP Location: Right Arm)   Pulse 64   Temp 98.1 F (36.7 C) (Oral)   Resp 14   Ht 5' 3 (1.6 m)   Wt 44 kg   SpO2 96%   BMI 17.18 kg/m   Physical Exam  Procedures  Procedures  ED Course / MDM    Medical Decision Making Amount and/or Complexity of Data Reviewed Labs: ordered.  Risk OTC drugs. Prescription drug management.   Started patient back on pain medicine.  Reviewed drug database.       Patsey Lot, MD 08/21/24 [phone]

## 2024-08-21 NOTE — Discharge Instructions (Addendum)
 Recommend close outpatient follow-up with substance abuse resources upon discharge Recommend safety return precautions Recommend consider close outpatient follow-up with the [facility]  You were seen by psychiatric team and cleared for discharge.  Please follow-up with your primary care doctor.

## 2024-08-21 NOTE — ED Provider Notes (Signed)
 Patient seen by mental health and psychiatrically cleared.  Patient is declining going to detox.  Recommended close follow-up with PCP.   Carla Ozell BROCKS, MD 08/21/24 1104

## 2024-08-21 NOTE — ED Notes (Signed)
 Patient belongings placed in locker 5

## 2024-08-21 NOTE — ED Notes (Signed)
Psych NP at bedside assessing patient

## 2024-08-22 ENCOUNTER — Other Ambulatory Visit: Payer: Self-pay

## 2024-08-22 DIAGNOSIS — M545 Low back pain, unspecified: Secondary | ICD-10-CM

## 2024-08-22 DIAGNOSIS — T148XXA Other injury of unspecified body region, initial encounter: Secondary | ICD-10-CM
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
## 2024-08-23 ENCOUNTER — Other Ambulatory Visit: Payer: Self-pay

## 2024-08-23 ENCOUNTER — Inpatient Hospital Stay
Admission: RE | Admit: 2024-08-23 | Discharge: 2024-08-23 | Disposition: A | Discharge status: Home / Self Care | Payer: Self-pay | Source: Ambulatory Visit | Attending: Physician Assistant | Admitting: Physician Assistant

## 2024-08-23 ENCOUNTER — Inpatient Hospital Stay
Admission: RE | Admit: 2024-08-23 | Discharge: 2024-08-23 | Disposition: A | Payer: Self-pay | Source: Ambulatory Visit | Attending: Physician Assistant | Admitting: Physician Assistant

## 2024-08-23 DIAGNOSIS — Z049 Encounter for examination and observation for unspecified reason: Secondary | ICD-10-CM

## 2024-08-25 ENCOUNTER — Ambulatory Visit (INDEPENDENT_AMBULATORY_CARE_PROVIDER_SITE_OTHER): Admitting: Licensed Clinical Social Worker

## 2024-08-25 DIAGNOSIS — F439 Reaction to severe stress, unspecified: Secondary | ICD-10-CM

## 2024-08-25 DIAGNOSIS — F322 Major depressive disorder, single episode, severe without psychotic features: Secondary | ICD-10-CM

## 2024-08-25 DIAGNOSIS — F411 Generalized anxiety disorder: Secondary | ICD-10-CM

## 2024-08-25 DIAGNOSIS — F1020 Alcohol dependence, uncomplicated: Secondary | ICD-10-CM
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
## 2024-08-29 ENCOUNTER — Ambulatory Visit: Admitting: Physician Assistant

## 2024-08-29 ENCOUNTER — Telehealth (HOSPITAL_COMMUNITY): Payer: Self-pay | Admitting: Licensed Clinical Social Worker

## 2024-08-29 ENCOUNTER — Other Ambulatory Visit

## 2024-08-29 NOTE — Telephone Encounter (Signed)
 The therapist attempts to reach Carla Martinez in response to a message left on the Nurse Call Line asking that Carla Martinez call her. The therapist leaves a HIPAA-compliant voicemail with Carla Martinez direct contact number.  [name], MA, LCSW, [facility], LCAS 08/29/2024

## 2024-08-30 ENCOUNTER — Telehealth (HOSPITAL_COMMUNITY): Payer: Self-pay | Admitting: Licensed Clinical Social Worker

## 2024-08-30 NOTE — Telephone Encounter (Signed)
 The therapist returns Carla Martinez's telephone call from yesterday confirming her identity via 2 identifiers.    She says that she could not go inpatient because of her back pain and that the reason she seemed so out of it when she last met with this therapist was because she took her oxycodone  on with her regular medications.  Since no longer taking her oxycodone , she says that she has been fine. Carla Martinez says that she is now ambulatory but is unable to walk very well.  Currently, she is at a restaurant and trying to feed her son so was not really able to talk so the therapist encourages her to call him back when it is a better time for her to speak.  [name], MA, LCSW, [facility], LCAS 08/30/2024

## 2024-08-31 ENCOUNTER — Encounter: Payer: Self-pay | Admitting: Cardiology

## 2024-08-31 ENCOUNTER — Ambulatory Visit: Attending: Cardiology | Admitting: Cardiology

## 2024-08-31 VITALS — BP 102/65 | HR 69 | Ht 64.0 in | Wt 96.5 lb

## 2024-08-31 DIAGNOSIS — I5181 Takotsubo syndrome: Secondary | ICD-10-CM

## 2024-08-31 DIAGNOSIS — R600 Localized edema: Secondary | ICD-10-CM | POA: Diagnosis not present

## 2024-08-31 DIAGNOSIS — I471 Supraventricular tachycardia, unspecified: Secondary | ICD-10-CM | POA: Diagnosis not present

## 2024-08-31 MED ORDER — POTASSIUM CHLORIDE CRYS ER 20 MEQ PO TBCR: 20.0000 meq | EXTENDED_RELEASE_TABLET | Freq: Every day | ORAL | 3 refills | Status: AC

## 2024-08-31 MED ORDER — FUROSEMIDE 20 MG PO TABS: 20.0000 mg | ORAL_TABLET | Freq: Every day | ORAL | 3 refills | Status: AC
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
## 2024-08-31 NOTE — Patient Instructions (Signed)

## 2024-09-01 ENCOUNTER — Ambulatory Visit: Payer: Self-pay

## 2024-09-01 MED ORDER — OXYCODONE HCL 10 MG PO TABS: 10.0000 mg | ORAL_TABLET | Freq: Four times a day (QID) | ORAL | 0 refills | Status: DC | PRN

## 2024-09-01 NOTE — Telephone Encounter (Signed)
 Sent some oxycodone  into the pharmacy to help with the pain

## 2024-09-01 NOTE — Telephone Encounter (Signed)
 Routed to PCP office and prescribing provider's office.  Severe back pain Patient states she doesn't have any more pain medication Patient states that she is in severe pain and wants something to help her get to the appointment Monday with neurosurgery. Patient states she was prescribed a medication in PCP office but was only given a few days worth & that medication didn't help a whole lot. Patient didn't want an in office appointment Patient is advised to go to the ER if symptoms worsen but she just wants this sent to her PCP office requesting medication for pain until neuro visit Monday 09/05/2024     FYI Only or Action Required?: Action required by provider: clinical question for provider, update on patient condition, and pain medication request.  Patient was last seen in primary care on 08/16/2024 by Franchot Isaiah LABOR, MD.  Called Nurse Triage reporting Back Pain.  Symptoms began about a month ago.  Interventions attempted: Rest, hydration, or home remedies.  Symptoms are: rapidly worsening.  Triage Disposition: See HCP Within 4 Hours (Or PCP Triage)  Patient/caregiver understands and will follow disposition?: No, wishes to speak with PCP                   Copied from CRM #[id number]. Topic: Clinical - Red Word Triage >> Sep 01, 2024 11:43 AM Amy B wrote: Red Word that prompted transfer to Nurse Triage: Severe pain in mid to lower back Reason for Disposition  [1] SEVERE back pain (e.g., excruciating, unable to do any normal activities) AND [2] not improved 2 hours after pain medicine  Answer Assessment - Initial Assessment Questions Severe Patient states she doesn't have any more pain medication Patient states that she is in severe pain and wants something to help her get to the appointment Monday with neurosurgery. Patient states she was prescribed a medication in PCP office but was only given a few days worth & that medication didn't help a whole  lot. Patient didn't want an in office appointment Patient is advised to go to the ER if symptoms worsen but she just wants this sent to her PCP office requesting medication for pain until neuro visit Monday 09/05/2024  Patient is advised to call us  back if anything changes or with any further questions/concerns. Patient is advised that if anything worsens to go to the Emergency Room. Patient verbalized understanding.    1. ONSET: When did the pain begin? (e.g., minutes, hours, days)     Around the end of October 2. LOCATION: Where does it hurt? (upper, mid or lower back)     Mid back and lower 3. SEVERITY: How bad is the pain?  (e.g., Scale 1-10; mild, moderate, or severe)     severe 4. PATTERN: Is the pain constant? (e.g., yes, no; constant, intermittent)      constant 5. RADIATION: Does the pain shoot into your legs or somewhere else?     no 6. CAUSE:  What do you think is causing the back pain?      Known problem 7. BACK OVERUSE:  Any recent lifting of heavy objects, strenuous work or exercise?     Daily use 8. MEDICINES: What have you taken so far for the pain? (e.g., nothing, acetaminophen , NSAIDS)     nothing 9. NEUROLOGIC SYMPTOMS: Do you have any weakness, numbness, or problems with bowel/bladder control?     denies 10. OTHER SYMPTOMS: Do you have any other symptoms? (e.g., fever, abdomen pain, burning with urination, blood in urine)  denies  Protocols used: Back Pain-A-AH

## 2024-09-04 ENCOUNTER — Other Ambulatory Visit (HOSPITAL_COMMUNITY): Payer: Self-pay | Admitting: Physician Assistant

## 2024-09-04 DIAGNOSIS — F411 Generalized anxiety disorder: Secondary | ICD-10-CM

## 2024-09-05 ENCOUNTER — Other Ambulatory Visit: Payer: Self-pay

## 2024-09-05 ENCOUNTER — Ambulatory Visit: Admitting: Physician Assistant

## 2024-09-05 ENCOUNTER — Encounter: Payer: Self-pay | Admitting: Physician Assistant

## 2024-09-05 ENCOUNTER — Ambulatory Visit

## 2024-09-05 ENCOUNTER — Telehealth: Payer: Self-pay

## 2024-09-05 VITALS — BP 120/78 | Wt 95.2 lb

## 2024-09-05 DIAGNOSIS — M545 Low back pain, unspecified: Secondary | ICD-10-CM

## 2024-09-05 DIAGNOSIS — W19XXXA Unspecified fall, initial encounter: Secondary | ICD-10-CM

## 2024-09-05 DIAGNOSIS — S32010A Wedge compression fracture of first lumbar vertebra, initial encounter for closed fracture: Secondary | ICD-10-CM | POA: Diagnosis not present

## 2024-09-05 DIAGNOSIS — T148XXA Other injury of unspecified body region, initial encounter: Secondary | ICD-10-CM

## 2024-09-05 DIAGNOSIS — S32019A Unspecified fracture of first lumbar vertebra, initial encounter for closed fracture: Secondary | ICD-10-CM

## 2024-09-05 MED ORDER — OXYCODONE HCL 5 MG PO TABS: 5.0000 mg | ORAL_TABLET | Freq: Three times a day (TID) | ORAL | 0 refills | Status: AC | PRN

## 2024-09-05 NOTE — Telephone Encounter (Signed)
 Called and spoke with patient.  Informed her that she has a neurosurgery appointment today and they should be able to fill her prescription. Pt verbalized understanding and has no questions or concerns.
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
## 2024-09-05 NOTE — Telephone Encounter (Signed)
 I refilled the medication for her on 09/01/2024. She has an appointment today with neurosurgery

## 2024-09-05 NOTE — Telephone Encounter (Signed)
 [name]

## 2024-09-05 NOTE — Telephone Encounter (Signed)
 Copied from CRM #[id number]. Topic: Clinical - Prescription Issue >> Sep 05, 2024 11:47 AM Alfonso ORN wrote: Reason for CRM: pt called to f/u on rx oxycodone  . Advised per pcp note it was sent 11/13 but pt states pharmacy did not have the prescription. Please contact pt back to advise. [phone] (H)

## 2024-09-06 ENCOUNTER — Telehealth: Payer: Self-pay

## 2024-09-06 NOTE — Telephone Encounter (Signed)
 She was suppose to be seen by neurosurgery yesterday. I will send that provider a message   Lyle,  I wrote [name] a script to cover her for her office visit with you. Did you write or recommend any medications?

## 2024-09-06 NOTE — Telephone Encounter (Signed)
 She was seen by neurosurgery yesterday and it looks like they were able to fill and make changes to her oxycodone  prescription.

## 2024-09-06 NOTE — Telephone Encounter (Signed)
 Copied from CRM #[id number]. Topic: Clinical - Red Word Triage >> Sep 01, 2024 11:43 AM Amy B wrote: Red Word that prompted transfer to Nurse Triage: Severe pain in mid to lower back >> Sep 02, 2024  3:33 PM Emylou G wrote: Patient called.. said hasn't heard about completed refill for oxy to be sent to .[name] Pls review [facility] - [place], [place] - [address]

## 2024-09-07 ENCOUNTER — Encounter (HOSPITAL_COMMUNITY): Payer: Self-pay

## 2024-09-09 ENCOUNTER — Other Ambulatory Visit: Payer: Self-pay | Admitting: Physician Assistant

## 2024-09-09 DIAGNOSIS — S32019A Unspecified fracture of first lumbar vertebra, initial encounter for closed fracture: Secondary | ICD-10-CM

## 2024-09-11 ENCOUNTER — Ambulatory Visit (HOSPITAL_COMMUNITY)
Admission: EM | Admit: 2024-09-11 | Discharge: 2024-09-11 | Disposition: A | Discharge status: Other Acute Inpatient Hospital | Attending: Psychiatry | Admitting: Psychiatry

## 2024-09-11 ENCOUNTER — Other Ambulatory Visit (HOSPITAL_COMMUNITY)
Admission: EM | Admit: 2024-09-11 | Discharge: 2024-09-14 | Disposition: A | Discharge status: Home / Self Care | Attending: Psychiatry | Admitting: Psychiatry

## 2024-09-11 DIAGNOSIS — M8008XD Age-related osteoporosis with current pathological fracture, vertebra(e), subsequent encounter for fracture with routine healing: Secondary | ICD-10-CM | POA: Diagnosis not present

## 2024-09-11 DIAGNOSIS — F5000 Anorexia nervosa, unspecified: Secondary | ICD-10-CM | POA: Diagnosis not present

## 2024-09-11 DIAGNOSIS — F419 Anxiety disorder, unspecified: Secondary | ICD-10-CM | POA: Diagnosis not present

## 2024-09-11 DIAGNOSIS — K219 Gastro-esophageal reflux disease without esophagitis: Secondary | ICD-10-CM | POA: Diagnosis not present

## 2024-09-11 DIAGNOSIS — Z79891 Long term (current) use of opiate analgesic: Secondary | ICD-10-CM | POA: Diagnosis not present

## 2024-09-11 DIAGNOSIS — Z79899 Other long term (current) drug therapy: Secondary | ICD-10-CM | POA: Insufficient documentation

## 2024-09-11 DIAGNOSIS — F32A Depression, unspecified: Secondary | ICD-10-CM | POA: Insufficient documentation

## 2024-09-11 DIAGNOSIS — E46 Unspecified protein-calorie malnutrition: Secondary | ICD-10-CM | POA: Diagnosis not present

## 2024-09-11 DIAGNOSIS — F410 Panic disorder [episodic paroxysmal anxiety] without agoraphobia: Secondary | ICD-10-CM | POA: Diagnosis not present

## 2024-09-11 DIAGNOSIS — N289 Disorder of kidney and ureter, unspecified: Secondary | ICD-10-CM | POA: Diagnosis not present

## 2024-09-11 DIAGNOSIS — R9431 Abnormal electrocardiogram [ECG] [EKG]: Secondary | ICD-10-CM | POA: Diagnosis not present

## 2024-09-11 DIAGNOSIS — F1090 Alcohol use, unspecified, uncomplicated: Secondary | ICD-10-CM

## 2024-09-11 DIAGNOSIS — Z653 Problems related to other legal circumstances: Secondary | ICD-10-CM | POA: Insufficient documentation

## 2024-09-11 DIAGNOSIS — Z634 Disappearance and death of family member: Secondary | ICD-10-CM | POA: Insufficient documentation

## 2024-09-11 DIAGNOSIS — R55 Syncope and collapse: Secondary | ICD-10-CM | POA: Diagnosis present

## 2024-09-11 DIAGNOSIS — F1290 Cannabis use, unspecified, uncomplicated: Secondary | ICD-10-CM | POA: Diagnosis present

## 2024-09-11 DIAGNOSIS — R636 Underweight: Secondary | ICD-10-CM

## 2024-09-11 DIAGNOSIS — M545 Low back pain, unspecified: Secondary | ICD-10-CM | POA: Insufficient documentation

## 2024-09-11 DIAGNOSIS — R944 Abnormal results of kidney function studies: Secondary | ICD-10-CM | POA: Diagnosis not present

## 2024-09-11 DIAGNOSIS — I509 Heart failure, unspecified: Secondary | ICD-10-CM | POA: Diagnosis not present

## 2024-09-11 DIAGNOSIS — Z636 Dependent relative needing care at home: Secondary | ICD-10-CM | POA: Diagnosis not present

## 2024-09-11 DIAGNOSIS — Z681 Body mass index (BMI) 19 or less, adult: Secondary | ICD-10-CM | POA: Diagnosis not present

## 2024-09-11 DIAGNOSIS — F17210 Nicotine dependence, cigarettes, uncomplicated: Secondary | ICD-10-CM | POA: Insufficient documentation

## 2024-09-11 DIAGNOSIS — F1024 Alcohol dependence with alcohol-induced mood disorder: Secondary | ICD-10-CM | POA: Diagnosis not present

## 2024-09-11 DIAGNOSIS — F1020 Alcohol dependence, uncomplicated: Secondary | ICD-10-CM

## 2024-09-11 DIAGNOSIS — G894 Chronic pain syndrome: Secondary | ICD-10-CM | POA: Insufficient documentation

## 2024-09-11 DIAGNOSIS — F10239 Alcohol dependence with withdrawal, unspecified: Secondary | ICD-10-CM | POA: Insufficient documentation

## 2024-09-11 LAB — POCT URINE DRUG SCREEN - MANUAL ENTRY (I-SCREEN)
POC Amphetamine UR: NOT DETECTED
POC Buprenorphine (BUP): NOT DETECTED
POC Cocaine UR: NOT DETECTED
POC Marijuana UR: NOT DETECTED
POC Methadone UR: NOT DETECTED
POC Methamphetamine UR: NOT DETECTED
POC Morphine: POSITIVE — AB
POC Oxazepam (BZO): NOT DETECTED
POC Oxycodone UR: POSITIVE — AB
POC Secobarbital (BAR): NOT DETECTED

## 2024-09-11 LAB — CBC WITH DIFFERENTIAL/PLATELET
Abs Immature Granulocytes: 0.02 K/uL (ref 0.00–0.07)
Basophils Absolute: 0 K/uL (ref 0.0–0.1)
Basophils Relative: 1 %
Eosinophils Absolute: 0.2 K/uL (ref 0.0–0.5)
Eosinophils Relative: 2 %
HCT: 35 % — ABNORMAL LOW (ref 36.0–46.0)
Hemoglobin: 11.2 g/dL — ABNORMAL LOW (ref 12.0–15.0)
Immature Granulocytes: 0 %
Lymphocytes Relative: 34 %
Lymphs Abs: 2.5 K/uL (ref 0.7–4.0)
MCH: 28.1 pg (ref 26.0–34.0)
MCHC: 32 g/dL (ref 30.0–36.0)
MCV: 87.7 fL (ref 80.0–100.0)
Monocytes Absolute: 0.4 K/uL (ref 0.1–1.0)
Monocytes Relative: 6 %
Neutro Abs: 4.2 K/uL (ref 1.7–7.7)
Neutrophils Relative %: 57 %
Platelets: 331 K/uL (ref 150–400)
RBC: 3.99 MIL/uL (ref 3.87–5.11)
RDW: 15 % (ref 11.5–15.5)
WBC: 7.4 K/uL (ref 4.0–10.5)
nRBC: 0 % (ref 0.0–0.2)

## 2024-09-11 LAB — COMPREHENSIVE METABOLIC PANEL WITH GFR
ALT: 17 U/L (ref 0–44)
AST: 17 U/L (ref 15–41)
Albumin: 3.4 g/dL — ABNORMAL LOW (ref 3.5–5.0)
Alkaline Phosphatase: 77 U/L (ref 38–126)
Anion gap: 9 (ref 5–15)
BUN: 24 mg/dL — ABNORMAL HIGH (ref 6–20)
CO2: 27 mmol/L (ref 22–32)
Calcium: 9.2 mg/dL (ref 8.9–10.3)
Chloride: 100 mmol/L (ref 98–111)
Creatinine, Ser: 1.07 mg/dL — ABNORMAL HIGH (ref 0.44–1.00)
GFR, Estimated: 59 mL/min — ABNORMAL LOW (ref >60)
Glucose, Bld: 73 mg/dL (ref 70–99)
Potassium: 4.3 mmol/L (ref 3.5–5.1)
Sodium: 136 mmol/L (ref 135–145)
Total Bilirubin: 0.3 mg/dL (ref 0.0–1.2)
Total Protein: 6.1 g/dL — ABNORMAL LOW (ref 6.5–8.1)

## 2024-09-11 LAB — URINALYSIS, ROUTINE W REFLEX MICROSCOPIC
Bilirubin Urine: NEGATIVE
Glucose, UA: NEGATIVE mg/dL
Hgb urine dipstick: NEGATIVE
Ketones, ur: NEGATIVE mg/dL
Leukocytes,Ua: NEGATIVE
Nitrite: NEGATIVE
Protein, ur: NEGATIVE mg/dL
Specific Gravity, Urine: 1.016 (ref 1.005–1.030)
pH: 5 (ref 5.0–8.0)

## 2024-09-11 LAB — ETHANOL: Alcohol, Ethyl (B): <15 mg/dL (ref <15)

## 2024-09-11 LAB — MAGNESIUM: Magnesium: 1.6 mg/dL — ABNORMAL LOW (ref 1.7–2.4)

## 2024-09-11 MED ORDER — CHLORDIAZEPOXIDE HCL 25 MG PO CAPS: 25.0000 mg | ORAL_CAPSULE | Freq: Every day | ORAL | Status: DC

## 2024-09-11 MED ORDER — GABAPENTIN 300 MG PO CAPS
300.0000 mg | ORAL_CAPSULE | Freq: Three times a day (TID) | ORAL | Status: DC
  Administered 2024-09-11 – 2024-09-14 (×8): 300 mg via ORAL
  Filled 2024-09-11 (×8): qty 1

## 2024-09-11 MED ORDER — CHLORDIAZEPOXIDE HCL 25 MG PO CAPS: 25.0000 mg | ORAL_CAPSULE | Freq: Four times a day (QID) | ORAL | Status: DC

## 2024-09-11 MED ORDER — MAGNESIUM HYDROXIDE 400 MG/5ML PO SUSP: 30.0000 mL | Freq: Every day | ORAL | Status: DC | PRN

## 2024-09-11 MED ORDER — CHLORDIAZEPOXIDE HCL 25 MG PO CAPS: 50.0000 mg | ORAL_CAPSULE | Freq: Once | ORAL | Status: DC

## 2024-09-11 MED ORDER — CHLORDIAZEPOXIDE HCL 25 MG PO CAPS: 25.0000 mg | ORAL_CAPSULE | Freq: Four times a day (QID) | ORAL | Status: DC | PRN

## 2024-09-11 MED ORDER — OLANZAPINE 10 MG IM SOLR: 5.0000 mg | Freq: Three times a day (TID) | INTRAMUSCULAR | Status: DC | PRN

## 2024-09-11 MED ORDER — POTASSIUM CHLORIDE CRYS ER 20 MEQ PO TBCR
20.0000 meq | EXTENDED_RELEASE_TABLET | Freq: Every day | ORAL | Status: DC
  Administered 2024-09-12 – 2024-09-14 (×3): 20 meq via ORAL
  Filled 2024-09-11 (×3): qty 1

## 2024-09-11 MED ORDER — ADULT MULTIVITAMIN W/MINERALS CH: 1.0000 | ORAL_TABLET | Freq: Every day | ORAL | Status: DC

## 2024-09-11 MED ORDER — QUETIAPINE FUMARATE 100 MG PO TABS: 100.0000 mg | ORAL_TABLET | Freq: Two times a day (BID) | ORAL | Status: DC

## 2024-09-11 MED ORDER — LOPERAMIDE HCL 2 MG PO CAPS: 2.0000 mg | ORAL_CAPSULE | ORAL | Status: DC | PRN

## 2024-09-11 MED ORDER — HYDROXYZINE HCL 25 MG PO TABS: 25.0000 mg | ORAL_TABLET | Freq: Four times a day (QID) | ORAL | Status: DC | PRN

## 2024-09-11 MED ORDER — THIAMINE HCL 100 MG/ML IJ SOLN
100.0000 mg | Freq: Once | INTRAMUSCULAR | Status: DC
  Filled 2024-09-11: qty 2

## 2024-09-11 MED ORDER — CHLORDIAZEPOXIDE HCL 25 MG PO CAPS: 25.0000 mg | ORAL_CAPSULE | Freq: Three times a day (TID) | ORAL | Status: DC

## 2024-09-11 MED ORDER — CHLORDIAZEPOXIDE HCL 25 MG PO CAPS
50.0000 mg | ORAL_CAPSULE | Freq: Once | ORAL | Status: AC
  Administered 2024-09-11: 50 mg via ORAL
  Filled 2024-09-11: qty 2

## 2024-09-11 MED ORDER — CHLORDIAZEPOXIDE HCL 25 MG PO CAPS: 25.0000 mg | ORAL_CAPSULE | ORAL | Status: DC

## 2024-09-11 MED ORDER — FUROSEMIDE 20 MG PO TABS
20.0000 mg | ORAL_TABLET | Freq: Every day | ORAL | Status: DC
  Administered 2024-09-12 – 2024-09-14 (×3): 20 mg via ORAL
  Filled 2024-09-11 (×3): qty 1

## 2024-09-11 MED ORDER — QUETIAPINE FUMARATE 400 MG PO TABS
400.0000 mg | ORAL_TABLET | Freq: Every day | ORAL | Status: DC
  Administered 2024-09-11: 400 mg via ORAL
  Filled 2024-09-11: qty 1

## 2024-09-11 MED ORDER — ACETAMINOPHEN 325 MG PO TABS: 650.0000 mg | ORAL_TABLET | Freq: Four times a day (QID) | ORAL | Status: DC | PRN

## 2024-09-11 MED ORDER — OXYCODONE HCL 5 MG PO TABS
5.0000 mg | ORAL_TABLET | Freq: Three times a day (TID) | ORAL | Status: DC | PRN
  Administered 2024-09-11 – 2024-09-14 (×6): 5 mg via ORAL
  Filled 2024-09-11 (×6): qty 1

## 2024-09-11 MED ORDER — ADULT MULTIVITAMIN W/MINERALS CH
1.0000 | ORAL_TABLET | Freq: Every day | ORAL | Status: DC
  Administered 2024-09-11 – 2024-09-12 (×2): 1 via ORAL
  Filled 2024-09-11 (×2): qty 1

## 2024-09-11 MED ORDER — ALUM & MAG HYDROXIDE-SIMETH 200-200-20 MG/5ML PO SUSP: 30.0000 mL | ORAL | Status: DC | PRN

## 2024-09-11 MED ORDER — OLANZAPINE 5 MG PO TBDP: 5.0000 mg | ORAL_TABLET | Freq: Three times a day (TID) | ORAL | Status: DC | PRN

## 2024-09-11 MED ORDER — FLUOXETINE HCL 20 MG PO CAPS
60.0000 mg | ORAL_CAPSULE | Freq: Every day | ORAL | Status: DC
  Administered 2024-09-12 – 2024-09-14 (×3): 60 mg via ORAL
  Filled 2024-09-11 (×3): qty 3

## 2024-09-11 MED ORDER — CHLORDIAZEPOXIDE HCL 25 MG PO CAPS
25.0000 mg | ORAL_CAPSULE | Freq: Four times a day (QID) | ORAL | Status: DC
  Administered 2024-09-11 – 2024-09-12 (×2): 25 mg via ORAL
  Filled 2024-09-11 (×2): qty 1

## 2024-09-11 MED ORDER — QUETIAPINE FUMARATE 100 MG PO TABS
100.0000 mg | ORAL_TABLET | Freq: Two times a day (BID) | ORAL | Status: DC
  Administered 2024-09-12: 100 mg via ORAL
  Filled 2024-09-11: qty 1

## 2024-09-11 MED ORDER — METOPROLOL SUCCINATE ER 25 MG PO TB24
12.5000 mg | ORAL_TABLET | Freq: Once | ORAL | Status: DC
  Filled 2024-09-11: qty 1

## 2024-09-11 MED ORDER — THIAMINE HCL 100 MG/ML IJ SOLN: 100.0000 mg | Freq: Once | INTRAMUSCULAR | Status: DC

## 2024-09-11 MED ORDER — TRAZODONE HCL 150 MG PO TABS
300.0000 mg | ORAL_TABLET | Freq: Every day | ORAL | Status: DC
  Administered 2024-09-11 – 2024-09-13 (×3): 300 mg via ORAL
  Filled 2024-09-11 (×3): qty 2
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
## 2024-09-11 NOTE — ED Provider Notes (Signed)
 Behavioral Health Urgent Care Medical Screening Exam  Patient Name: Carla Martinez MRN: [id number] Date of Evaluation: 09/11/24 Chief Complaint: I need to detox Diagnosis:  Final diagnoses:  Alcohol use disorder   Chart reviewed and discussed with attending psychiatrist, Dr. Kandi Hahn,  History of Present illness: Carla Martinez is a 63 y.o. female who presents to the [facility] voluntary reporting she needs to detox from alcohol. Reports she drinks in binge drinks and has been binge drinking starting this week, could not recall when started. Reports she drinks the individual small bottles of wine and nyquil a bottle a day. Patient has a PMHx significant for MDD adn alcohol abuse. Per chart review she was seen on 08/20/24 however, she reported she did not stay because she was not ambulatory. Reported she last had a drinking this morning (09/11/2024) around 9:30am two doses (two caps) of nyquil. Report drank two mini bottles of wine yesterday 09/10/2024. Reports she has having blackouts, feel panic, nervousness, and trouble breathing, feeling like she cannot catch her breath. Denied current symptoms due to having drank earlier today. Report eating THC gummies couple of day. Last ate two gummies this morning could not recall grams of gummies. Denied SI/HI and psychosis. Patient has been charge with a DUI and has not gone to court.   Pt is seen face-to-face on the [facility] Adult treatment area.  Patient has a history of alcohol use disorder, anxiety, depression, and panic attacks. Pt is alert & oriented x 4 and engages in today's visit. Today, pt states I need to detox from alcohol. Pt states she binges alcohol and has been on this current binge for the past week and I am coming off now. States she normally drinks one to two 4-pack -187 ml btls of wine/day. States last alcohol use was two 30 ml doses of Nyquil at 10am. States her daughter passed away 3 years ago and has been having a difficult  time with alcohol and dealing with grief. In addition to alcohol use, patient endorses she smokes less than 2 cigarettes/day; has been using CBD Gummies with hemp that she purchases from [employer]; and has been using oxycodone  that is currently prescribed for back pain with last use being today.  She denies cocaine, heroin, fentanyl  or methamphetamine use.  History of seizures many, many, many years ago.  States that she has had periods of time where my body feels like a marionette and it just crumbles with last episode 2 weeks ago.  She endorses a poor appetite; denies weight loss.  She endorses poor sleep and estimates she is getting 3 to 4 hours of uninterrupted sleep at night.  States that she begins to experience symptoms of alcohol withdrawal less than a day after last alcohol use.  Currently she is endorsing anxiety and has a mild tremor that she can feel.  Additional psychiatric history noted below. Based on the patient's history of binge alcohol use, reported symptoms consistent with alcohol withdrawal (tremor and anxiety), admission to facility based crisis [facility]) is recommended for supervised alcohol detox.   Dx: AUD, Anxiety, Depression, Panic attacks Denies previous suicide attempts Hospitalization: many [facility] (Apr, 2025) Outpatient psych provider: [facility] outpt clinic Family psychiatric history: none reported Family history: family history includes Asthma in her mother; COPD in her mother; Diabetes type I in her child; Down syndrome in her son; Heart disease in her mother and sister; Hypertension in her mother; Leukemia in her sister; Stroke in her mother and son.  Social: lives with her 53 yo non-verbal son and her 79 yo mother. Divorced. 5 children living, 1 child deceased.    Flowsheet Row ED from 08/20/2024 in [facility] Emergency Department at [facility] Most recent reading at 08/20/2024  8:12 PM ED from 08/20/2024 in [facility] Most recent  reading at 08/20/2024  3:32 PM ED from 08/13/2024 in [facility] Emergency Department at [facility] Most recent reading at 08/13/2024  7:12 PM  C-SSRS RISK CATEGORY No Risk No Risk No Risk    Psychiatric Specialty Exam  Presentation  General Appearance:Disheveled  Eye Contact:Fair  Speech:Garbled  Speech Volume:Normal  Handedness:Right   Mood and Affect  Mood: Anxious; Depressed  Affect: Depressed; Tearful   Thought Process  Thought Processes: Coherent  Descriptions of Associations:Intact  Orientation:Full (Time, Place and Person)  Thought Content:WDL  Diagnosis of Schizophrenia or Schizoaffective disorder in past: No  Duration of Psychotic Symptoms: No data recorded Hallucinations:None  Ideas of Reference:None  Suicidal Thoughts:No  Homicidal Thoughts:No   Sensorium  Memory: Immediate Fair; Recent Fair; Remote Fair  Judgment: Fair  Insight: Fair   Art Therapist  Concentration: Fair  Attention Span: Fair  Recall: [employer] of Knowledge: Fair  Language: Fair   Psychomotor Activity  Psychomotor Activity: Restlessness   Assets  Assets: Manufacturing Systems Engineer; Desire for Improvement   Sleep  Sleep: Poor  Number of hours:  2   Physical Exam: Physical Exam Vitals and nursing note reviewed.  HENT:     Head: Normocephalic.     Mouth/Throat:     Mouth: Mucous membranes are moist.  Cardiovascular:     Rate and Rhythm: Normal rate.  Pulmonary:     Effort: Pulmonary effort is normal.  Musculoskeletal:     Comments: Recent L1, L2 fracture - wears velcro back brace for support  Skin:    General: Skin is warm and dry.  Neurological:     Mental Status: She is alert and oriented to person, place, and time.  Psychiatric:     Comments: See HPI    Review of Systems  Constitutional:  Negative for chills and fever.  HENT:  Negative for congestion and sore throat.   Respiratory:  Negative for shortness of  breath.   Cardiovascular:  Negative for chest pain and palpitations.  Gastrointestinal:  Negative for diarrhea, nausea and vomiting.  Musculoskeletal:  Positive for back pain.  Psychiatric/Behavioral:  Positive for depression. Negative for hallucinations, substance abuse and suicidal ideas. The patient is nervous/anxious.    Blood pressure 125/76, pulse 65, temperature (!) 97 F (36.1 C), temperature source Oral, resp. rate 20, SpO2 99%. There is no height or weight on file to calculate BMI.  Musculoskeletal: Strength & Muscle Tone: within normal limits Gait & Station: normal Patient leans: N/A   [facility] MSE Discharge Disposition for Follow up and Recommendations: Based on my evaluation I certify that psychiatric inpatient services furnished can reasonably be expected to improve the patient's condition which I recommend transfer to an appropriate accepting facility.   Disposition: Admit to [facility] for supervised alcohol detox  Medical Decision Making Lab Orders         CBC with Differential/Platelet         Comprehensive metabolic panel         Magnesium          Ethanol         Urinalysis, Routine w reflex microscopic -Urine, Clean Catch  POCT Urine Drug Screen - (I-Screen)     EKG - QTc 503 (prolonged)  Sherrell Culver, PMHNP-BC, FNP-BC  09/11/2024, 4:08 PM

## 2024-09-11 NOTE — ED Notes (Signed)
 Pt is sleeping at the moment. No acute distress noted. Q15 safety checks in place.

## 2024-09-11 NOTE — ED Notes (Signed)
 Pt declined prn tylenol  before dinner. Skin assessment unremarkable. Pt seems to be HOH. Pt provided with dinner as requested.

## 2024-09-11 NOTE — ED Notes (Signed)
 Pt redirected from placing feet on table in dayroom. Pt reported desire not to eat peas and mash potatoes during dinner, saying that she prefers high protein, only wanting to eat the turkey. Request was denied by staff due to medical history of anorexia nervosa. Pt advised to speak with medical provider tomorrow. Pt also provided with fixodent as requested, rn informed pt that it will be kept in the med room to use as needed.
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
## 2024-09-11 NOTE — ED Notes (Signed)
 Pt oriented to unit, consents completed. Pt c/o level 7/10 pain, pt agreed to prn tylenol .

## 2024-09-11 NOTE — ED Notes (Signed)
 Pt administered prn oxycodone  for c/o level 7/10 lumbar pain. Pt decline thiamine  injection as scheduled.

## 2024-09-11 NOTE — Discharge Instructions (Addendum)
 Transfer to [facility] when bed available

## 2024-09-12 MED ORDER — LORAZEPAM 1 MG PO TABS
1.0000 mg | ORAL_TABLET | Freq: Once | ORAL | Status: AC
  Administered 2024-09-12: 1 mg via ORAL
  Filled 2024-09-12: qty 1

## 2024-09-12 MED ORDER — ADULT MULTIVITAMIN W/MINERALS CH
1.0000 | ORAL_TABLET | Freq: Every day | ORAL | Status: DC
  Administered 2024-09-13 – 2024-09-14 (×2): 1 via ORAL
  Filled 2024-09-12 (×2): qty 1

## 2024-09-12 MED ORDER — HYDROXYZINE HCL 25 MG PO TABS
25.0000 mg | ORAL_TABLET | Freq: Four times a day (QID) | ORAL | Status: DC | PRN
  Administered 2024-09-12: 25 mg via ORAL
  Filled 2024-09-12: qty 1

## 2024-09-12 MED ORDER — LORAZEPAM 1 MG PO TABS
1.0000 mg | ORAL_TABLET | Freq: Four times a day (QID) | ORAL | Status: DC | PRN
  Administered 2024-09-12: 1 mg via ORAL
  Filled 2024-09-12: qty 1

## 2024-09-12 MED ORDER — LOPERAMIDE HCL 2 MG PO CAPS: 2.0000 mg | ORAL_CAPSULE | ORAL | Status: DC | PRN

## 2024-09-12 MED ORDER — THIAMINE HCL 100 MG/ML IJ SOLN: 100.0000 mg | Freq: Once | INTRAMUSCULAR | Status: DC

## 2024-09-12 MED ORDER — VITAMIN D 25 MCG (1000 UNIT) PO TABS
2000.0000 [IU] | ORAL_TABLET | Freq: Every day | ORAL | Status: DC
  Administered 2024-09-12 – 2024-09-14 (×3): 2000 [IU] via ORAL
  Filled 2024-09-12 (×3): qty 2

## 2024-09-12 MED ORDER — THIAMINE MONONITRATE 100 MG PO TABS
100.0000 mg | ORAL_TABLET | Freq: Every day | ORAL | Status: DC
  Administered 2024-09-13 – 2024-09-14 (×2): 100 mg via ORAL
  Filled 2024-09-12 (×2): qty 1

## 2024-09-12 NOTE — ED Notes (Signed)
 RN spoke with patient A&Ox4. Denies intent to harm self/others when asked. Denies A/VH or any physical complaints when asked. Patient quietly sitting in dayroom watching tv. No acute distress noted. Active listening, support and encouragement provided. Routine safety checks conducted according to facility protocol. Encouraged patient to notify staff if thoughts of harm toward self or others arise. Patient verbalize understanding and agreement.
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
## 2024-09-12 NOTE — Care Management (Addendum)
 [facility] Care Management...  Addendum 2:48 pm,  Patient declined all services   Patient requested to discharge after detox  Patients estimated discharge date is 09/14/24  Writer met with patient to discuss discharge planning.  Patient declined inpatient treatment and CD-IOP.   Patient reported living with mother and disabled son and the need to take care of them.  Writer sent secure chat to check eligibility for out patient services.  Per Elsie Maier   Due to past experiences, he will see patient for individual therapy. However, he will not see or schedule her for SA-IOP.  Writer will coordinate appointment

## 2024-09-12 NOTE — ED Notes (Signed)
Patient sleeping with no s/s of distress.
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
## 2024-09-12 NOTE — ED Notes (Signed)
 Pt is sleeping. No acute distress noted. Respirations are even and labored. Q15 safety checks in place.

## 2024-09-12 NOTE — ED Provider Notes (Signed)
 Facility Based Crisis Admission H&P  Date: 09/12/24 Patient Name: Carla Martinez MRN: [id number] Chief Complaint: Detox  Diagnoses:  Final diagnoses:  Alcohol use disorder, severe, dependence (HCC)    HPI: [name] is a 63 year old female who presented  to [facility] voluntarily,  requesting assistance with ongoing alcohol issues. Patient has a PMHx significant for MDD and alcohol abuse. Per chart review patient was seen on 4/4 when she presented to [facility] with similar symptoms and was admitted to [facility]. Patient was discharged on 4/8 and returned 4/28 for her second admission. Patient denied  SI, HI or AVH  . Patient reported that  she had been maintaining her sobriety but started binge drinking about a week ago, unsure how it started. She reported drinking about 6 6 individual bottle of wine a day, and also drinks about a bottle of ZzzQuil because it has alcohol in it. Lat was consumed yesterday prior to coming here and had 2 cups of ZZZquil and 2 mini  bottles of wine.  She reported that she was experiencing blackouts, feel of panic, nervousness and was having trouble breathing. She reported using THC gummies a couple of days  a week and last use was yesterday morning. Legally, patient has a DUI charge and has not presented to court. Patient was evaluated by a provider and met criteria for treatment at [facility]. Detox protocol was initiated. 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 She has been requesting more proteins and refusing the other items on her meals. She has been complaining of back pain and medications are given as prescribed.   Assessment: Patient is evaluated face-to face by this provider. Chart is reviewed today 09/12/2024, and treatment plan discussed with DR Lawrnce.  [name] is a 63 year-old female seen in her room. She is cooperative with the assessment. She appears older than her stated age. She is  casually dressed and groomed and hygiene is decent. She appears malnourished, unkept, and admits to  hx of Anorexia. Her thought process is coherent, goal directed. Speech is clear and well articulated. Her eye contact is fair. Patient does not seem to be preoccupied, or responding to internal stimuli. Denies SI/HI/AVH. Patient is restless and helpless. Reports that she is experiencing withdrawal symptoms including anxiety, restlessness,  tremors, body aching, dry mouth, poor appetite, and not sleeping much. Admits to often bingeing on alcohol and unable to control her intakes. States she does not remember how it starts. States she even uses ZZZQuil because it has alcohol in it. Yesterday, and for a week, patient drank about a pack of small wine bottles and also drank a bottle of ZZZQuil. States she has had this addiction for about 26 years which resulted into her separation/divorce from her husband.  States she was able to stay sober for just a few days after discharge from the hospital. She reports feeling guilty and remorseful after using. Patient states my mother is 72 but looks way younger than me. Reports that her triggers include having to take care of her 27 year-old son with developmental disability. She also reports she continue to experience grief related to her deceased daughter and always gets depressed after using. She also reports smoking cigarettes and using CBD  gummies, occasionally. She denies not using any additional substances. She reports taking Oxyxodone which is legally prescribed for her back pain. She does report seizure -like symptoms but has never had any actual  seizure. Reports poor appetite not sleeping well.  Patient reports medical concerns including chronic back pain, hx of Anorexia, Hypotention, CHF, GERD, underweight, and chronic back pain. Labs indicate elevated BUN and Creatinine, low magnesium , Albumin , protein, Amonia level. GFR 59 upon admission. BAL below 15. EKG   indicates prolonged QT.   Patient presents with indicative symptoms of alcohol withdrawals, and willing to detox. Librium  detox protocol was initiated.Home medications were continued.  However, considering  her current unstable labs, treatment was modified as follow:   - Librium  Protocol discontinued.  - Ativan  detox protocol initiated - Vitamin D3 2000 unit added  - Daily EKG - Nutrition consult ordered  - Seroquel  discontinued due to prolonged QT -  Acetaminophen  discontinued     PHQ 2-9:  Flowsheet Row ED from 09/11/2024 in [facility] ED from 08/20/2024 in [facility] Video Visit from 07/13/2024 in [facility]  Thoughts that you would be better off dead, or of hurting yourself in some way Not at all Not at all More than half the days  PHQ-9 Total Score 12 14 20     Flowsheet Row ED from 09/11/2024 in [facility] Most recent reading at 09/11/2024  5:03 PM ED from 08/20/2024 in [facility] Emergency Department at [facility] Most recent reading at 08/20/2024  8:12 PM ED from 08/20/2024 in [facility] Most recent reading at 08/20/2024  3:32 PM  C-SSRS RISK CATEGORY No Risk No Risk No Risk    Screenings    Flowsheet Row Most Recent Value  CIWA-Ar Total 2    Total Time spent with patient: 1 hour  Musculoskeletal  Strength & Muscle Tone: within normal limits Gait & Station: normal Patient leans: N/A  Psychiatric Specialty Exam  Presentation General Appearance:  Casual  Eye Contact: Fair  Speech: Clear and Coherent  Speech Volume: Normal  Handedness: Right   Mood and Affect  Mood: Anxious; Depressed  Affect: Congruent   Thought Process  Thought Processes: Coherent  Descriptions of Associations:Intact  Orientation:Full (Time, Place and Person)  Thought Content:WDL  Diagnosis of Schizophrenia or  Schizoaffective disorder in past: No  Duration of Psychotic Symptoms: No data recorded Hallucinations:Hallucinations: None  Ideas of Reference:None  Suicidal Thoughts:Suicidal Thoughts: No  Homicidal Thoughts:Homicidal Thoughts: No   Sensorium  Memory: Immediate Good; Recent Good; Remote Good  Judgment: Fair  Insight: Fair   Chartered Certified Accountant: Fair  Attention Span: Fair  Recall: [employer] of Knowledge: Fair  Language: Fair   Psychomotor Activity  Psychomotor Activity: Psychomotor Activity: Normal   Assets  Assets: Communication Skills; Desire for Improvement; Housing; Resilience   Sleep  Sleep: Sleep: Poor Number of Hours of Sleep: 4   Nutritional Assessment (For OBS and FBC admissions only) Has the patient had a weight loss or gain of 10 pounds or more in the last 3 months?: No Has the patient had a decrease in food intake/or appetite?: Yes Does the patient have dental problems?: No Does the patient have eating habits or behaviors that may be indicators of an eating disorder including binging or inducing vomiting?: Yes Has the patient recently lost weight without trying?: 0 Has the patient been eating poorly because of a decreased appetite?: 1 Malnutrition Screening Tool Score: 1    Physical Exam HENT:     Head: Normocephalic.     Right Ear: Tympanic membrane normal.     Left Ear: Tympanic  membrane normal.     Nose: Nose normal.     Mouth/Throat:     Mouth: Mucous membranes are dry.  Eyes:     Extraocular Movements: Extraocular movements intact.     Pupils: Pupils are equal, round, and reactive to light.  Cardiovascular:     Rate and Rhythm: Normal rate.     Pulses: Normal pulses.  Pulmonary:     Effort: Pulmonary effort is normal.  Musculoskeletal:        General: Normal range of motion.     Cervical back: Normal range of motion.  Neurological:     General: No focal deficit present.     Mental Status: She is  alert and oriented to person, place, and time.  Psychiatric:        Thought Content: Thought content normal.    Review of Systems  Constitutional: Negative.   HENT: Negative.    Eyes: Negative.   Respiratory: Negative.    Cardiovascular: Negative.   Gastrointestinal: Negative.   Genitourinary: Negative.   Musculoskeletal: Negative.   Skin: Negative.   Neurological: Negative.   Endo/Heme/Allergies: Negative.   Psychiatric/Behavioral:  Positive for depression and substance abuse. The patient is nervous/anxious.     Blood pressure 95/71, pulse 77, temperature 99.7 F (37.6 C), temperature source Oral, resp. rate 17, SpO2 98%. There is no height or weight on file to calculate BMI.  Past Psychiatric History: MDD, GAZ, Substance use   Is the patient at risk to self? No  Has the patient been a risk to self in the past 6 months? No .    Has the patient been a risk to self within the distant past? No   Is the patient a risk to others? No   Has the patient been a risk to others in the past 6 months? No   Has the patient been a risk to others within the distant past? No   Past Medical History: CHF, Anorexia, GERD Family History: NA Social History: Divorced. Lives with mother and disabled son. Financially stable  Last Labs:  Admission on 09/11/2024  Component Date Value Ref Range Status   Color, Urine 09/11/2024 YELLOW  YELLOW Final   APPearance 09/11/2024 CLEAR  CLEAR Final   Specific Gravity, Urine 09/11/2024 1.016  1.005 - 1.030 Final   pH 09/11/2024 5.0  5.0 - 8.0 Final   Glucose, UA 09/11/2024 NEGATIVE  NEGATIVE mg/dL Final   Hgb urine dipstick 09/11/2024 NEGATIVE  NEGATIVE Final   Bilirubin Urine 09/11/2024 NEGATIVE  NEGATIVE Final   Ketones, ur 09/11/2024 NEGATIVE  NEGATIVE mg/dL Final   Protein, ur 88/76/7974 NEGATIVE  NEGATIVE mg/dL Final   Nitrite 88/76/7974 NEGATIVE  NEGATIVE Final   Leukocytes,Ua 09/11/2024 NEGATIVE  NEGATIVE Final   Performed at [facility]  Lab, [address] [place], [place] [address]  Admission on 09/11/2024, Discharged on 09/11/2024  Component Date Value Ref Range Status   WBC 09/11/2024 7.4  4.0 - 10.5 K/uL Final   RBC 09/11/2024 3.99  3.87 - 5.11 MIL/uL Final   Hemoglobin 09/11/2024 11.2 (L)  12.0 - 15.0 g/dL Final   HCT 88/76/7974 35.0 (L)  36.0 - 46.0 % Final   MCV 09/11/2024 87.7  80.0 - 100.0 fL Final   MCH 09/11/2024 28.1  26.0 - 34.0 pg Final   MCHC 09/11/2024 32.0  30.0 - 36.0 g/dL Final   RDW 88/76/7974 15.0  11.5 - 15.5 % Final   Platelets 09/11/2024 331  150 - 400  K/uL Final   nRBC 09/11/2024 0.0  0.0 - 0.2 % Final   Neutrophils Relative % 09/11/2024 57  % Final   Neutro Abs 09/11/2024 4.2  1.7 - 7.7 K/uL Final   Lymphocytes Relative 09/11/2024 34  % Final   Lymphs Abs 09/11/2024 2.5  0.7 - 4.0 K/uL Final   Monocytes Relative 09/11/2024 6  % Final   Monocytes Absolute 09/11/2024 0.4  0.1 - 1.0 K/uL Final   Eosinophils Relative 09/11/2024 2  % Final   Eosinophils Absolute 09/11/2024 0.2  0.0 - 0.5 K/uL Final   Basophils Relative 09/11/2024 1  % Final   Basophils Absolute 09/11/2024 0.0  0.0 - 0.1 K/uL Final   Immature Granulocytes 09/11/2024 0  % Final   Abs Immature Granulocytes 09/11/2024 0.02  0.00 - 0.07 K/uL Final   Performed at [facility] Lab, [address], [place], [place] [address]   Sodium 09/11/2024 136  135 - 145 mmol/L Final   Potassium 09/11/2024 4.3  3.5 - 5.1 mmol/L Final   Chloride 09/11/2024 100  98 - 111 mmol/L Final   CO2 09/11/2024 27  22 - 32 mmol/L Final   Glucose, Bld 09/11/2024 73  70 - 99 mg/dL Final   Glucose reference range applies only to samples taken after fasting for at least 8 hours.   BUN 09/11/2024 24 (H)  6 - 20 mg/dL Final   Creatinine, Ser 09/11/2024 1.07 (H)  0.44 - 1.00 mg/dL Final   Calcium  09/11/2024 9.2  8.9 - 10.3 mg/dL Final   Total Protein 88/76/7974 6.1 (L)  6.5 - 8.1 g/dL Final   Albumin  09/11/2024 3.4 (L)  3.5 - 5.0 g/dL Final   AST 88/76/7974 17  15 -  41 U/L Final   ALT 09/11/2024 17  0 - 44 U/L Final   Alkaline Phosphatase 09/11/2024 77  38 - 126 U/L Final   Total Bilirubin 09/11/2024 0.3  0.0 - 1.2 mg/dL Final   GFR, Estimated 09/11/2024 59 (L)  >60 mL/min Final   Comment: (NOTE) Calculated using the CKD-EPI Creatinine Equation (2021)    Anion gap 09/11/2024 9  5 - 15 Final   Performed at [facility] Lab, [address] [place], [place] [address]   Magnesium  09/11/2024 1.6 (L)  1.7 - 2.4 mg/dL Final   Performed at [facility] Lab, [address] [place], [place] [address]   Alcohol, Ethyl (B) 09/11/2024 <15  <15 mg/dL Final   Comment: (NOTE) For medical purposes only. Performed at [facility] Lab, [address], [place], [place] [address]    POC Amphetamine UR 09/11/2024 None Detected  NONE DETECTED (Cut Off Level 1000 ng/mL) Final   POC Secobarbital (BAR) 09/11/2024 None Detected  NONE DETECTED (Cut Off Level 300 ng/mL) Final   POC Buprenorphine (BUP) 09/11/2024 None Detected  NONE DETECTED (Cut Off Level 10 ng/mL) Final   POC Oxazepam (BZO) 09/11/2024 None Detected  NONE DETECTED (Cut Off Level 300 ng/mL) Final   POC Cocaine UR 09/11/2024 None Detected  NONE DETECTED (Cut Off Level 300 ng/mL) Final   POC Methamphetamine UR 09/11/2024 None Detected  NONE DETECTED (Cut Off Level 1000 ng/mL) Final   POC Morphine  09/11/2024 Positive (A)  NONE DETECTED (Cut Off Level 300 ng/mL) Final   POC Methadone UR 09/11/2024 None Detected  NONE DETECTED (Cut Off Level 300 ng/mL) Final   POC Oxycodone  UR 09/11/2024 Positive (A)  NONE DETECTED (Cut Off Level 100 ng/mL) Final  POC Marijuana UR 09/11/2024 None Detected  NONE DETECTED (Cut Off Level 50 ng/mL) Final  Admission on 08/20/2024, Discharged on 08/21/2024  Component Date Value Ref Range Status   Sodium 08/20/2024 137  135 - 145 mmol/L Final   Potassium 08/20/2024 3.8  3.5 - 5.1 mmol/L Final   Chloride 08/20/2024 100  98 - 111 mmol/L Final   CO2 08/20/2024 25  22 - 32 mmol/L  Final   Glucose, Bld 08/20/2024 113 (H)  70 - 99 mg/dL Final   Glucose reference range applies only to samples taken after fasting for at least 8 hours.   BUN 08/20/2024 14  6 - 20 mg/dL Final   Creatinine, Ser 08/20/2024 1.21 (H)  0.44 - 1.00 mg/dL Final   Calcium  08/20/2024 9.2  8.9 - 10.3 mg/dL Final   Total Protein 88/98/7974 6.1 (L)  6.5 - 8.1 g/dL Final   Albumin  08/20/2024 3.3 (L)  3.5 - 5.0 g/dL Final   AST 88/98/7974 21  15 - 41 U/L Final   ALT 08/20/2024 19  0 - 44 U/L Final   Alkaline Phosphatase 08/20/2024 101  38 - 126 U/L Final   Total Bilirubin 08/20/2024 0.6  0.0 - 1.2 mg/dL Final   GFR, Estimated 08/20/2024 51 (L)  >60 mL/min Final   Comment: (NOTE) Calculated using the CKD-EPI Creatinine Equation (2021)    Anion gap 08/20/2024 12  5 - 15 Final   Performed at [facility] Lab, [address], [place], [place] [address]   Alcohol, Ethyl (B) 08/20/2024 <15  <15 mg/dL Final   Comment: (NOTE) For medical purposes only. Performed at [facility] Lab, [address], [place], [place] [address]    Opiates 08/20/2024 POSITIVE (A)  NONE DETECTED Final   Cocaine 08/20/2024 NONE DETECTED  NONE DETECTED Final   Benzodiazepines 08/20/2024 NONE DETECTED  NONE DETECTED Final   Amphetamines 08/20/2024 NONE DETECTED  NONE DETECTED Final   Tetrahydrocannabinol 08/20/2024 NONE DETECTED  NONE DETECTED Final   Barbiturates 08/20/2024 NONE DETECTED  NONE DETECTED Final   Comment: (NOTE) DRUG SCREEN FOR MEDICAL PURPOSES ONLY.  IF CONFIRMATION IS NEEDED FOR ANY PURPOSE, NOTIFY LAB WITHIN 5 DAYS.  LOWEST DETECTABLE LIMITS FOR URINE DRUG SCREEN Drug Class                     Cutoff (ng/mL) Amphetamine and metabolites    1000 Barbiturate and metabolites    200 Benzodiazepine                 200 Opiates and metabolites        300 Cocaine and metabolites        300 THC                            50 Performed at [facility] Lab, [address], [place], [place] [address]     WBC 08/20/2024 7.7  4.0 - 10.5 K/uL Final   RBC 08/20/2024 3.86 (L)  3.87 - 5.11 MIL/uL Final   Hemoglobin 08/20/2024 10.8 (L)  12.0 - 15.0 g/dL Final   HCT 88/98/7974 34.1 (L)  36.0 - 46.0 % Final   MCV 08/20/2024 88.3  80.0 - 100.0 fL Final   MCH 08/20/2024 28.0  26.0 - 34.0 pg Final   MCHC 08/20/2024 31.7  30.0 - 36.0 g/dL Final   RDW 88/98/7974 14.1  11.5 - 15.5 % Final   Platelets 08/20/2024  415 (H)  150 - 400 K/uL Final   nRBC 08/20/2024 0.0  0.0 - 0.2 % Final   Neutrophils Relative % 08/20/2024 55  % Final   Neutro Abs 08/20/2024 4.3  1.7 - 7.7 K/uL Final   Lymphocytes Relative 08/20/2024 34  % Final   Lymphs Abs 08/20/2024 2.6  0.7 - 4.0 K/uL Final   Monocytes Relative 08/20/2024 8  % Final   Monocytes Absolute 08/20/2024 0.6  0.1 - 1.0 K/uL Final   Eosinophils Relative 08/20/2024 3  % Final   Eosinophils Absolute 08/20/2024 0.2  0.0 - 0.5 K/uL Final   Basophils Relative 08/20/2024 0  % Final   Basophils Absolute 08/20/2024 0.0  0.0 - 0.1 K/uL Final   Immature Granulocytes 08/20/2024 0  % Final   Abs Immature Granulocytes 08/20/2024 0.03  0.00 - 0.07 K/uL Final   Performed at [facility], [address], [place], [place] [address]  Admission on 08/13/2024, Discharged on 08/14/2024  Component Date Value Ref Range Status   Lipase 08/13/2024 18  11 - 51 U/L Final   Performed at [facility], [address], [place], [place] [address]   Sodium 08/13/2024 137  135 - 145 mmol/L Final   Potassium 08/13/2024 3.9  3.5 - 5.1 mmol/L Final   Chloride 08/13/2024 103  98 - 111 mmol/L Final   CO2 08/13/2024 18 (L)  22 - 32 mmol/L Final   Glucose, Bld 08/13/2024 104 (H)  70 - 99 mg/dL Final   Glucose reference range applies only to samples taken after fasting for at least 8 hours.   BUN 08/13/2024 23 (H)  6 - 20 mg/dL Final   Creatinine, Ser 08/13/2024 0.87  0.44 - 1.00 mg/dL Final   Calcium  08/13/2024 9.3  8.9 - 10.3 mg/dL Final   Total Protein 89/74/7974 6.3 (L)  6.5 - 8.1  g/dL Final   Albumin  08/13/2024 3.3 (L)  3.5 - 5.0 g/dL Final   AST 89/74/7974 25  15 - 41 U/L Final   ALT 08/13/2024 22  0 - 44 U/L Final   Alkaline Phosphatase 08/13/2024 86  38 - 126 U/L Final   Total Bilirubin 08/13/2024 0.8  0.0 - 1.2 mg/dL Final   GFR, Estimated 08/13/2024 >60  >60 mL/min Final   Comment: (NOTE) Calculated using the CKD-EPI Creatinine Equation (2021)    Anion gap 08/13/2024 16 (H)  5 - 15 Final   Performed at [facility] Lab, [address] [place], [place] [address]   WBC 08/13/2024 10.6 (H)  4.0 - 10.5 K/uL Final   RBC 08/13/2024 4.30  3.87 - 5.11 MIL/uL Final   Hemoglobin 08/13/2024 12.4  12.0 - 15.0 g/dL Final   HCT 89/74/7974 35.9 (L)  36.0 - 46.0 % Final   MCV 08/13/2024 83.5  80.0 - 100.0 fL Final   MCH 08/13/2024 28.8  26.0 - 34.0 pg Final   MCHC 08/13/2024 34.5  30.0 - 36.0 g/dL Final   RDW 89/74/7974 13.5  11.5 - 15.5 % Final   Platelets 08/13/2024 386  150 - 400 K/uL Final   nRBC 08/13/2024 0.0  0.0 - 0.2 % Final   Performed at [facility] Lab, [address] [place], [place] [address]   Color, Urine 08/14/2024 YELLOW  YELLOW Final   APPearance 08/14/2024 CLEAR  CLEAR Final   Specific Gravity, Urine 08/14/2024 >1.046 (H)  1.005 - 1.030 Final   pH 08/14/2024 6.0  5.0 - 8.0 Final  Glucose, UA 08/14/2024 NEGATIVE  NEGATIVE mg/dL Final   Hgb urine dipstick 08/14/2024 NEGATIVE  NEGATIVE Final   Bilirubin Urine 08/14/2024 NEGATIVE  NEGATIVE Final   Ketones, ur 08/14/2024 20 (A)  NEGATIVE mg/dL Final   Protein, ur 89/73/7974 NEGATIVE  NEGATIVE mg/dL Final   Nitrite 89/73/7974 NEGATIVE  NEGATIVE Final   Leukocytes,Ua 08/14/2024 NEGATIVE  NEGATIVE Final   Performed at [facility], [address] [place], [place] [address]   Alcohol, Ethyl (B) 08/13/2024 <15  <15 mg/dL Final   Comment: (NOTE) For medical purposes only. Performed at [facility], [address], [place], [place] [address]    Opiates 08/14/2024 POSITIVE (A)  NONE  DETECTED Final   Cocaine 08/14/2024 NONE DETECTED  NONE DETECTED Final   Benzodiazepines 08/14/2024 POSITIVE (A)  NONE DETECTED Final   Amphetamines 08/14/2024 NONE DETECTED  NONE DETECTED Final   Tetrahydrocannabinol 08/14/2024 NONE DETECTED  NONE DETECTED Final   Barbiturates 08/14/2024 NONE DETECTED  NONE DETECTED Final   Comment: (NOTE) DRUG SCREEN FOR MEDICAL PURPOSES ONLY.  IF CONFIRMATION IS NEEDED FOR ANY PURPOSE, NOTIFY LAB WITHIN 5 DAYS.  LOWEST DETECTABLE LIMITS FOR URINE DRUG SCREEN Drug Class                     Cutoff (ng/mL) Amphetamine and metabolites    1000 Barbiturate and metabolites    200 Benzodiazepine                 200 Opiates and metabolites        300 Cocaine and metabolites        300 THC                            50 Performed at [facility] Lab, [address], [place], [place] [address]   Appointment on 08/05/2024  Component Date Value Ref Range Status   AR max vel 08/05/2024 2.75  cm2 Final   AV Peak grad 08/05/2024 4.2  mmHg Final   Ao pk vel 08/05/2024 1.03  m/s Final   S' Lateral 08/05/2024 2.80  cm Final   Area-P 1/2 08/05/2024 3.39  cm2 Final   AV Area VTI 08/05/2024 2.76  cm2 Final   AV Mean grad 08/05/2024 2.0  mmHg Final   AV Area mean vel 08/05/2024 2.63  cm2 Final   Est EF 08/05/2024 55 - 60%   Final  Lab on 06/28/2024  Component Date Value Ref Range Status   Pro B Natriuretic peptide (BNP) 06/28/2024 57.0  0.0 - 100.0 pg/mL Final   Sodium 06/28/2024 140  135 - 145 mEq/L Final   Potassium 06/28/2024 3.7  3.5 - 5.1 mEq/L Final   Chloride 06/28/2024 101  96 - 112 mEq/L Final   CO2 06/28/2024 31  19 - 32 mEq/L Final   Glucose, Bld 06/28/2024 80  70 - 99 mg/dL Final   BUN 90/90/7974 10  6 - 23 mg/dL Final   Creatinine, Ser 06/28/2024 1.05  0.40 - 1.20 mg/dL Final   GFR 90/90/7974 57.65 (L)  >60.00 mL/min Final   Calculated using the CKD-EPI Creatinine Equation (2021)   Calcium  06/28/2024 9.5  8.4 - 10.5 mg/dL Final  Office Visit  on 06/21/2024  Component Date Value Ref Range Status   WBC 06/21/2024 6.3  4.0 - 10.5 K/uL Final   RBC 06/21/2024 3.86 (L)  3.87 - 5.11 Mil/uL Final   Platelets  06/21/2024 312.0  150.0 - 400.0 K/uL Final   Hemoglobin 06/21/2024 11.3 (L)  12.0 - 15.0 g/dL Final   HCT 90/97/7974 34.7 (L)  36.0 - 46.0 % Final   MCV 06/21/2024 89.8  78.0 - 100.0 fl Final   MCHC 06/21/2024 32.5  30.0 - 36.0 g/dL Final   RDW 90/97/7974 15.8 (H)  11.5 - 15.5 % Final   Sodium 06/21/2024 142  135 - 145 mEq/L Final   Potassium 06/21/2024 3.1 (L)  3.5 - 5.1 mEq/L Final   Chloride 06/21/2024 104  96 - 112 mEq/L Final   CO2 06/21/2024 28  19 - 32 mEq/L Final   Glucose, Bld 06/21/2024 65 (L)  70 - 99 mg/dL Final   BUN 90/97/7974 11  6 - 23 mg/dL Final   Creatinine, Ser 06/21/2024 1.01  0.40 - 1.20 mg/dL Final   GFR 90/97/7974 60.41  >60.00 mL/min Final   Calculated using the CKD-EPI Creatinine Equation (2021)   Calcium  06/21/2024 8.8  8.4 - 10.5 mg/dL Final   D-Dimer, Quant 06/21/2024 0.41  <0.50 mcg/mL FEU Final   Comment: Elevated D-dimer levels are associated with DIC, malignancies, inflammation, sepsis, surgery, trauma, and pregnancy. A D-dimer result less than 0.5 mcg/mL  FEU, in conjunction with a non-high clinical pre-test  probability assessment model, excludes deep vein thrombosis and pulmonary embolism. However, since  D-dimer values increase with age, the [employer] recommends an age-adjusted cut-off value in patients older than 50. The calculation for an age adjusted cut-off value is age (years) x 0.01 mcg/mL  FEU. For example, the cut-off for a 61 year old patient would be 72 x 0.01 mcg/mL FEU. . For additional information, please refer to http://education.[facility].com/faq/FAQ149 (This link is being provided for  informational/educational purposes only.)    Pro B Natriuretic peptide (BNP) 06/21/2024 216.0 (H)  0.0 - 100.0 pg/mL Final   TSH 06/21/2024 0.91  0.35 -  5.50 uIU/mL Final  Office Visit on 03/15/2024  Component Date Value Ref Range Status   WBC 03/15/2024 6.5  4.0 - 10.5 K/uL Final   RBC 03/15/2024 4.33  3.87 - 5.11 Mil/uL Final   Platelets 03/15/2024 333.0  150.0 - 400.0 K/uL Final   Hemoglobin 03/15/2024 12.7  12.0 - 15.0 g/dL Final   HCT 94/72/7974 38.9  36.0 - 46.0 % Final   MCV 03/15/2024 89.7  78.0 - 100.0 fl Final   MCHC 03/15/2024 32.6  30.0 - 36.0 g/dL Final   RDW 94/72/7974 14.0  11.5 - 15.5 % Final   Sodium 03/15/2024 138  135 - 145 mEq/L Final   Potassium 03/15/2024 3.8  3.5 - 5.1 mEq/L Final   Chloride 03/15/2024 96  96 - 112 mEq/L Final   CO2 03/15/2024 35 (H)  19 - 32 mEq/L Final   Glucose, Bld 03/15/2024 76  70 - 99 mg/dL Final   BUN 94/72/7974 8  6 - 23 mg/dL Final   Creatinine, Ser 03/15/2024 0.98  0.40 - 1.20 mg/dL Final   Total Bilirubin 03/15/2024 0.3  0.2 - 1.2 mg/dL Final   Alkaline Phosphatase 03/15/2024 98  39 - 117 U/L Final   AST 03/15/2024 20  0 - 37 U/L Final   ALT 03/15/2024 14  0 - 35 U/L Final   Total Protein 03/15/2024 6.5  6.0 - 8.3 g/dL Final   Albumin  03/15/2024 3.9  3.5 - 5.2 g/dL Final   GFR 94/72/7974 62.76  >60.00 mL/min Final   Calculated using the CKD-EPI Creatinine Equation (2021)  Calcium  03/15/2024 9.7  8.4 - 10.5 mg/dL Final    Allergies: Patient has no known allergies.  Medications:  Facility Ordered Medications  Medication   alum & mag hydroxide-simeth (MAALOX/MYLANTA) 200-200-20 MG/5ML suspension 30 mL   magnesium  hydroxide (MILK OF MAGNESIA) suspension 30 mL   [COMPLETED] chlordiazePOXIDE  (LIBRIUM ) capsule 50 mg   FLUoxetine  (PROZAC ) capsule 60 mg   furosemide  (LASIX ) tablet 20 mg   gabapentin  (NEURONTIN ) capsule 300 mg   metoprolol  succinate (TOPROL -XL) 24 hr tablet 12.5 mg   oxyCODONE  (Oxy IR/ROXICODONE ) immediate release tablet 5 mg   potassium chloride  SA (KLOR-CON  M) CR tablet 20 mEq   traZODone  (DESYREL ) tablet 300 mg   [START ON 09/13/2024] thiamine  (VITAMIN B1)  tablet 100 mg   multivitamin with minerals tablet 1 tablet   LORazepam  (ATIVAN ) tablet 1 mg   hydrOXYzine  (ATARAX ) tablet 25 mg   loperamide  (IMODIUM ) capsule 2-4 mg   cholecalciferol  (VITAMIN D3) 25 MCG (1000 UNIT) tablet 2,000 Units   PTA Medications  Medication Sig   cyanocobalamin  (VITAMIN B12) 500 MCG tablet Take 1 tablet (500 mcg total) by mouth daily. (Patient taking differently: Take 500 mcg by mouth in the morning.)   melatonin 3 MG TABS tablet Take 6 mg by mouth. (Patient taking differently: Take 9 mg by mouth.)   hydrOXYzine  (ATARAX ) 50 MG tablet Take 2 tablets (100 mg total) by mouth 3 (three) times daily as needed. (Patient taking differently: Take 100 mg by mouth in the morning, at noon, and at bedtime.)   FLUoxetine  (PROZAC ) 20 MG capsule Take 3 capsules (60 mg total) by mouth daily.   gabapentin  (NEURONTIN ) 300 MG capsule Take 1 capsule (300 mg total) by mouth 3 (three) times daily.   trazodone  (DESYREL ) 300 MG tablet Take 1 tablet (300 mg total) by mouth at bedtime.   QUEtiapine  (SEROQUEL ) 400 MG tablet Take 1 tablet (400 mg total) by mouth at bedtime.   QUEtiapine  (SEROQUEL ) 100 MG tablet Take 1 tablet (100 mg total) by mouth 2 (two) times daily.   metoprolol  succinate (TOPROL  XL) 25 MG 24 hr tablet Take 0.5 tablets (12.5 mg total) by mouth daily. (Patient taking differently: Take 12.5 mg by mouth in the morning.)   pantoprazole  (PROTONIX ) 40 MG tablet Take 1 tablet (40 mg total) by mouth daily. (Patient taking differently: Take 40 mg by mouth daily at 12 noon.)   omeprazole  (PRILOSEC) 20 MG capsule Take 1 capsule (20 mg total) by mouth daily. (Patient taking differently: Take 20 mg by mouth daily at 12 noon.)   promethazine  (PHENERGAN ) 25 MG tablet Take 1 tablet (25 mg total) by mouth every 6 (six) hours as needed for nausea or vomiting.   calcium  carbonate (OS-CAL - DOSED IN MG OF ELEMENTAL CALCIUM ) 1250 (500 Ca) MG tablet Take 1 tablet by mouth daily with breakfast.    magnesium  oxide (MAG-OX) 400 (240 Mg) MG tablet Take 400 mg by mouth daily at 12 noon.   Biotin  5000 MCG CAPS Take 5,000 mcg by mouth in the morning.   furosemide  (LASIX ) 20 MG tablet Take 1 tablet (20 mg total) by mouth daily.   potassium chloride  SA (KLOR-CON  M) 20 MEQ tablet Take 1 tablet (20 mEq total) by mouth daily.   oxyCODONE  (ROXICODONE ) 5 MG immediate release tablet Take 1 tablet (5 mg total) by mouth every 8 (eight) hours as needed.   Naltrexone  (VIVITROL ) 380 MG SUSR Inject 380 mg into the muscle every 28 (twenty-eight) days. (Patient not taking: Reported on 09/12/2024)  acamprosate  (CAMPRAL ) 333 MG tablet Take 2 tablets (666 mg total) by mouth 3 (three) times daily with meals. (Patient not taking: Reported on 09/12/2024)   celecoxib  (CELEBREX ) 200 MG capsule Take 200 mg by mouth in the morning. (Patient not taking: Reported on 09/12/2024)    Long Term Goals: Improvement in symptoms so as ready for discharge  Short Term Goals: Patient will verbalize feelings in meetings with treatment team members., Patient will attend at least of 50% of the groups daily., Pt will complete the PHQ9 on admission, day 3 and discharge., Patient will participate in completing the [place] Suicide Severity Rating Scale, Patient will score a low risk of violence for 24 hours prior to discharge, and Patient will take medications as prescribed daily.  Medical Decision Making  Treatment need confirmed  Continue admission orders and :    - Librium  Protocol discontinued.  - Ativan  detox protocol initiated - Vitamin D3 2000 unit added  - Daily EKG - Nutrition consult ordered  - Seroquel  discontinued due to prolonged QT -  Acetaminophen  discontinued. Oxycodone  continued  Patient refusing daily weight stating that I don't want to know my weight, because if I know that I am losing weight, then I I will stop eating. We will continue to monitor patient's intakes and will follow nutritionist recommendations.    Recommendations  Based on my evaluation the patient does not appear to have an emergency medical condition.  Randall Bouquet, NP 09/12/24  10:14 AM

## 2024-09-12 NOTE — ED Notes (Signed)
Pt did attend AA group  
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
## 2024-09-12 NOTE — ED Notes (Signed)
 Pt administered prn oxycodone  for c/o 8/10 chronic back pain. Pt inquired about possibility of pain patch and back brace, provider notified. Pt denies si hi and avh- verbal contract for safety provided. Pt reports feeling melancholy, presents as depressed and flat. Pt attended AA group . Scheduled dose of metoprolol  held due to lop BP, med review, and fall risk. Pt provided with snack.

## 2024-09-13 DIAGNOSIS — R636 Underweight: Secondary | ICD-10-CM | POA: Diagnosis not present

## 2024-09-13 DIAGNOSIS — R9431 Abnormal electrocardiogram [ECG] [EKG]: Secondary | ICD-10-CM

## 2024-09-13 DIAGNOSIS — N289 Disorder of kidney and ureter, unspecified: Secondary | ICD-10-CM | POA: Diagnosis not present

## 2024-09-13 DIAGNOSIS — F1020 Alcohol dependence, uncomplicated: Secondary | ICD-10-CM

## 2024-09-13 MED ORDER — QUETIAPINE FUMARATE 200 MG PO TABS: 200.0000 mg | ORAL_TABLET | Freq: Every day | ORAL | Status: DC

## 2024-09-13 MED ORDER — QUETIAPINE FUMARATE 400 MG PO TABS
400.0000 mg | ORAL_TABLET | Freq: Every day | ORAL | Status: DC
  Administered 2024-09-13: 400 mg via ORAL
  Filled 2024-09-13: qty 1

## 2024-09-13 NOTE — Discharge Instructions (Addendum)
 [facility] Care Management...  Patient declined inpatient and CD-IOP  Patient can discharge to home 09/14/2024 by 11:00 am  RN to arrange transportation  [address] [place] [address]   Writer coordinated individual counseling with Carla Martinez for 09/27/2024 @ 2 p.m  [facility]   [address]  [place], [place] [address] [phone]  Scripts  Based on the information that you have provided and the presenting issues outpatient services and resources for have been recommended.  It is imperative that you follow through with treatment recommendations within 5-7 days from the of discharge to mitigate further risk to your safety and mental well-being. A list of referrals has been provided below to get you started.  You are not limited to the list provided.  In case of an urgent crisis, you may contact the Mobile Crisis Unit with [facility] at [phone].   Follow up resources for AA groups are provided below   About A.A. People who think they have a drinking problem are welcome to attend any A.A. meeting. They can sit and listen and learn more about recovery. Or they can share about themselves. It's completely up to them. Everyone is welcome. You don't have to pay anything to attend.  Our members are not professionals on alcoholism or addiction of any kind. We do not provide medical or psychological diagnoses or prognoses. We leave that up to you. A.A. is a supplemental resource. We offer ongoing support for anyone who has a drinking problem based on a solution that works for us .  Meetings are classified as "open" or "closed". Professionals are welcome to observe "open" A.A. meetings. "Open" meetings are for anyone interested in learning more about what happens in A.A. "Closed" meetings are for only those who have a desire to stop drinking. Find an open meeting here.   A.A. Resources   Resources from A.A. in [facility] an A.A. Coordinator for more information: cpc@[email or web address] .org or pi@[facility] .org. We look forward to hearing from you. [place] A.A. Meetings AA support, including regional helplines, is at the local level. Find local AA information: lodgingshop.fi     AA Meeting Early [facility]  Location:: Address [address] [place], [place], [address]  Weekly Meeting Schedule MONDAY, 8:00 AM - 9:00 AM Early [place] Discussion Open Virtual Join Early [facility] Online AA Meeting Password: [phone]  TUESDAY, 8:00 AM - 9:00 AM Early [place] Discussion Open Virtual Join Early [facility] Online AA Meeting Password: [phone]  [facility], 8:00 AM - 9:00 AM Early [place] Discussion Open Virtual Join Early [facility] Online AA Meeting Password: [phone]  THURSDAY, 8:00 AM - 9:00 AM Early [place] Discussion Open Virtual Join Early [facility] Online AA Meeting Password: [phone]  FRIDAY, 8:00 AM - 9:00 AM Early [place] Discussion Open Virtual Join Early [facility] Online AA Meeting Password: [phone]  SATURDAY, 8:00 AM - 9:00 AM Early [place] Discussion Open Virtual Join Early [facility] Online AA Meeting Password: [phone]  SUNDAY, 8:00 AM - 9:00 AM Early [place] Discussion Open Virtual Join Early [facility] Online AA Meeting Password: [phone]

## 2024-09-13 NOTE — ED Notes (Signed)

## 2024-09-13 NOTE — ED Notes (Signed)
Patient sleeping with no s/s of distress.

## 2024-09-13 NOTE — ED Notes (Signed)
Pt is sleeping now. No acute distress noted.
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
## 2024-09-13 NOTE — ED Notes (Signed)
 Patient continues to spill juice in her food, in the hopes of getting more, since she has been here the patient has  tried to get more food, even though she does not eat it all. This witness observed her spilling juice on her food this morning at breakfast, she spilled juice at lunch, and just did it again at dinner, wanting another tray. There were no extra trays, the juice was not all in her food, she did not want it, but ate it anyway.
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
## 2024-09-13 NOTE — ED Provider Notes (Signed)
 Behavioral Health Progress Note  Date and Time: 09/13/2024 1:07 PM Name: Carla Martinez MRN:  [id number]  Subjective:  I need my Seroquel   Diagnosis:  Final diagnoses:  Alcohol use disorder, severe, dependence (HCC)  Nonspecific abnormal electrocardiogram (ECG) (EKG)  EKG abnormalities  Underweight  Renal insufficiency  Anorexia nervosa (HCC)  Chronic pain syndrome  Lumbar back pain  Carla Martinez is a 63 year old female who presented  to [facility] voluntarily,  requesting assistance with ongoing alcohol issues. Patient has a PMHx significant for MDD and alcohol abuse. Per chart review patient was seen on 4/4 when she presented to [facility] with similar symptoms and was admitted to [facility]. Patient was discharged on 4/8 and returned 4/28 for her second admission. Patient denied  SI, HI or AVH  . Patient reported that  she had been maintaining her sobriety but started binge drinking about a week ago, unsure how it started. She reported drinking about 6 6 individual bottle of wine a day, and also drinks about a bottle of ZzzQuil because it has alcohol in it. Lat was consumed yesterday prior to coming here and had 2 cups of ZZZquil and 2 mini  bottles of wine.  She reported that she was experiencing blackouts, feel of panic, nervousness and was having trouble breathing. She reported using THC gummies a couple of days  a week and last use was yesterday morning. Legally, patient has a DUI charge and has not presented to court. Patient was evaluated by a provider and met criteria for treatment at [facility]. Detox protocol was initiated.   Per nursing, patient continues to be preoccupied by her diet and medications .  Reported she needs to get back on Seroquel . She is attending groups as scheduled. She was observed throughout the night while sleeping, no sign of distress.    Assessment: Patient is evaluated face-to face by this provider. Chart is reviewed today 09/13/2024, and  Dr Lawrnce is  consulted.  Carla Martinez is seen in her room alone. She appears restless and  reports she needs to get back on her Seroquel . Provider attempts to educate her about health risks associated with this medication, considering  her abnormal EKG.  Patient does not sound to understand. She reports that Seroquel  helps her with rest. She reports no concern about her other medications. She continues to report that  she will go back home upon completion of detox. She denies SI/HI/AVH.  Patient is active in the milieu, attending groups and other activities. States she slept about 5 hours total. Appetite is OK I guess.  Her post discharge plan is to go to [facility] and continue outpatient services for medication management.     Total Time spent with patient: 30 minutes  Past Psychiatric History: MDD, PTSD, Alcohol use disorder Past Medical History: Chronic back pain, Anorexia Nervosa, Abnormal EKGs,  Family History: NA Family Psychiatric  History: NA Social History: Has a home. Lives with mother and son  Additional Social History: Divorced, receiving spousal support   Pain Medications: See MAR Prescriptions: See MAR Over the Counter: See MAR History of alcohol / drug use?: Yes Longest period of sobriety (when/how long): Patient report she has binges and states for the past week she has been on a binge drinking mini wine bottles, nyquil and eating THC gummies. Negative Consequences of Use: Legal Withdrawal Symptoms: Tremors, Nausea / Vomiting, Irritability Name of Substance 1: Alcohol 1 - Age of First Use: 19 1 - Amount (size/oz): Patient reports drinking  mini wine bottles and eating  THC gummies 1 - Frequency: Patient report been on a binge the past week 1 - Duration: ongoing 1 - Last Use / Amount: 09/11/2024 1 - Method of Aquiring: Legal 1- Route of Use: oral                  Sleep: Fair  Appetite:  Fair  Current Medications:  Current Facility-Administered Medications  Medication Dose  Route Frequency Provider Last Rate Last Admin   alum & mag hydroxide-simeth (MAALOX/MYLANTA) 200-200-20 MG/5ML suspension 30 mL  30 mL Oral Q4H PRN Hobson, Fran E, NP       cholecalciferol  (VITAMIN D3) 25 MCG (1000 UNIT) tablet 2,000 Units  2,000 Units Oral Daily Randall Starlyn HERO, NP   2,000 Units at 09/13/24 9075   FLUoxetine  (PROZAC ) capsule 60 mg  60 mg Oral Daily Hobson, Fran E, NP   60 mg at 09/13/24 9075   furosemide  (LASIX ) tablet 20 mg  20 mg Oral Daily Hobson, Fran E, NP   20 mg at 09/13/24 9074   gabapentin  (NEURONTIN ) capsule 300 mg  300 mg Oral TID Hobson, Fran E, NP   300 mg at 09/13/24 9074   hydrOXYzine  (ATARAX ) tablet 25 mg  25 mg Oral Q6H PRN Randall Starlyn HERO, NP   25 mg at 09/12/24 1146   loperamide  (IMODIUM ) capsule 2-4 mg  2-4 mg Oral PRN Kia Stavros M, NP       LORazepam  (ATIVAN ) tablet 1 mg  1 mg Oral Q6H PRN Randall Starlyn HERO, NP   1 mg at 09/12/24 2133   magnesium  hydroxide (MILK OF MAGNESIA) suspension 30 mL  30 mL Oral Daily PRN Hobson, Fran E, NP       metoprolol  succinate (TOPROL -XL) 24 hr tablet 12.5 mg  12.5 mg Oral Once Hobson, Fran E, NP       multivitamin with minerals tablet 1 tablet  1 tablet Oral Daily Wadie Liew M, NP   1 tablet at 09/13/24 0925   oxyCODONE  (Oxy IR/ROXICODONE ) immediate release tablet 5 mg  5 mg Oral Q8H PRN Hobson, Fran E, NP   5 mg at 09/13/24 1256   potassium chloride  SA (KLOR-CON  M) CR tablet 20 mEq  20 mEq Oral Daily Hobson, Fran E, NP   20 mEq at 09/13/24 9074   thiamine  (VITAMIN B1) tablet 100 mg  100 mg Oral Daily Randall Starlyn M, NP   100 mg at 09/13/24 9074   traZODone  (DESYREL ) tablet 300 mg  300 mg Oral QHS Hobson, Fran E, NP   300 mg at 09/12/24 2130   Current Outpatient Medications  Medication Sig Dispense Refill   Biotin  5000 MCG CAPS Take 5,000 mcg by mouth in the morning.     calcium  carbonate (OS-CAL - DOSED IN MG OF ELEMENTAL CALCIUM ) 1250 (500 Ca) MG tablet Take 1 tablet by mouth daily  with breakfast.     cyanocobalamin  (VITAMIN B12) 500 MCG tablet Take 1 tablet (500 mcg total) by mouth daily. (Patient taking differently: Take 500 mcg by mouth in the morning.) 30 tablet 3   FLUoxetine  (PROZAC ) 20 MG capsule Take 3 capsules (60 mg total) by mouth daily. 90 capsule 1   furosemide  (LASIX ) 20 MG tablet Take 1 tablet (20 mg total) by mouth daily. 180 tablet 3   gabapentin  (NEURONTIN ) 300 MG capsule Take 1 capsule (300 mg total) by mouth 3 (three) times daily. 90 capsule 1   hydrOXYzine  (ATARAX ) 50 MG tablet Take 2  tablets (100 mg total) by mouth 3 (three) times daily as needed. (Patient taking differently: Take 100 mg by mouth in the morning, at noon, and at bedtime.) 150 tablet 1   magnesium  oxide (MAG-OX) 400 (240 Mg) MG tablet Take 400 mg by mouth daily at 12 noon.     melatonin 3 MG TABS tablet Take 6 mg by mouth. (Patient taking differently: Take 9 mg by mouth.)     metoprolol  succinate (TOPROL  XL) 25 MG 24 hr tablet Take 0.5 tablets (12.5 mg total) by mouth daily. (Patient taking differently: Take 12.5 mg by mouth in the morning.) 45 tablet 3   omeprazole  (PRILOSEC) 20 MG capsule Take 1 capsule (20 mg total) by mouth daily. (Patient taking differently: Take 20 mg by mouth daily at 12 noon.) 30 capsule 0   oxyCODONE  (ROXICODONE ) 5 MG immediate release tablet Take 1 tablet (5 mg total) by mouth every 8 (eight) hours as needed. 20 tablet 0   pantoprazole  (PROTONIX ) 40 MG tablet Take 1 tablet (40 mg total) by mouth daily. (Patient taking differently: Take 40 mg by mouth daily at 12 noon.) 90 tablet 1   potassium chloride  SA (KLOR-CON  M) 20 MEQ tablet Take 1 tablet (20 mEq total) by mouth daily. 180 tablet 3   promethazine  (PHENERGAN ) 25 MG tablet Take 1 tablet (25 mg total) by mouth every 6 (six) hours as needed for nausea or vomiting. 30 tablet 0   QUEtiapine  (SEROQUEL ) 100 MG tablet Take 1 tablet (100 mg total) by mouth 2 (two) times daily. 60 tablet 1   QUEtiapine  (SEROQUEL ) 400 MG  tablet Take 1 tablet (400 mg total) by mouth at bedtime. 30 tablet 1   trazodone  (DESYREL ) 300 MG tablet Take 1 tablet (300 mg total) by mouth at bedtime. 30 tablet 1   acamprosate  (CAMPRAL ) 333 MG tablet Take 2 tablets (666 mg total) by mouth 3 (three) times daily with meals. (Patient not taking: Reported on 09/12/2024) 180 tablet 1   celecoxib  (CELEBREX ) 200 MG capsule Take 200 mg by mouth in the morning. (Patient not taking: Reported on 09/12/2024)     Naltrexone  (VIVITROL ) 380 MG SUSR Inject 380 mg into the muscle every 28 (twenty-eight) days. (Patient not taking: Reported on 09/12/2024) 1.2 each 3    Labs  Lab Results:  Admission on 09/11/2024  Component Date Value Ref Range Status   Color, Urine 09/11/2024 YELLOW  YELLOW Final   APPearance 09/11/2024 CLEAR  CLEAR Final   Specific Gravity, Urine 09/11/2024 1.016  1.005 - 1.030 Final   pH 09/11/2024 5.0  5.0 - 8.0 Final   Glucose, UA 09/11/2024 NEGATIVE  NEGATIVE mg/dL Final   Hgb urine dipstick 09/11/2024 NEGATIVE  NEGATIVE Final   Bilirubin Urine 09/11/2024 NEGATIVE  NEGATIVE Final   Ketones, ur 09/11/2024 NEGATIVE  NEGATIVE mg/dL Final   Protein, ur 88/76/7974 NEGATIVE  NEGATIVE mg/dL Final   Nitrite 88/76/7974 NEGATIVE  NEGATIVE Final   Leukocytes,Ua 09/11/2024 NEGATIVE  NEGATIVE Final   Performed at [facility], [address] [place], [place] [address]  Admission on 09/11/2024, Discharged on 09/11/2024  Component Date Value Ref Range Status   WBC 09/11/2024 7.4  4.0 - 10.5 K/uL Final   RBC 09/11/2024 3.99  3.87 - 5.11 MIL/uL Final   Hemoglobin 09/11/2024 11.2 (L)  12.0 - 15.0 g/dL Final   HCT 88/76/7974 35.0 (L)  36.0 - 46.0 % Final   MCV 09/11/2024 87.7  80.0 - 100.0 fL Final   [facility] 09/11/2024  28.1  26.0 - 34.0 pg Final   MCHC 09/11/2024 32.0  30.0 - 36.0 g/dL Final   RDW 88/76/7974 15.0  11.5 - 15.5 % Final   Platelets 09/11/2024 331  150 - 400 K/uL Final   nRBC 09/11/2024 0.0  0.0 - 0.2 % Final   Neutrophils  Relative % 09/11/2024 57  % Final   Neutro Abs 09/11/2024 4.2  1.7 - 7.7 K/uL Final   Lymphocytes Relative 09/11/2024 34  % Final   Lymphs Abs 09/11/2024 2.5  0.7 - 4.0 K/uL Final   Monocytes Relative 09/11/2024 6  % Final   Monocytes Absolute 09/11/2024 0.4  0.1 - 1.0 K/uL Final   Eosinophils Relative 09/11/2024 2  % Final   Eosinophils Absolute 09/11/2024 0.2  0.0 - 0.5 K/uL Final   Basophils Relative 09/11/2024 1  % Final   Basophils Absolute 09/11/2024 0.0  0.0 - 0.1 K/uL Final   Immature Granulocytes 09/11/2024 0  % Final   Abs Immature Granulocytes 09/11/2024 0.02  0.00 - 0.07 K/uL Final   Performed at [facility], [address], [place], [place] [address]   Sodium 09/11/2024 136  135 - 145 mmol/L Final   Potassium 09/11/2024 4.3  3.5 - 5.1 mmol/L Final   Chloride 09/11/2024 100  98 - 111 mmol/L Final   CO2 09/11/2024 27  22 - 32 mmol/L Final   Glucose, Bld 09/11/2024 73  70 - 99 mg/dL Final   Glucose reference range applies only to samples taken after fasting for at least 8 hours.   BUN 09/11/2024 24 (H)  6 - 20 mg/dL Final   Creatinine, Ser 09/11/2024 1.07 (H)  0.44 - 1.00 mg/dL Final   Calcium  09/11/2024 9.2  8.9 - 10.3 mg/dL Final   Total Protein 88/76/7974 6.1 (L)  6.5 - 8.1 g/dL Final   Albumin  09/11/2024 3.4 (L)  3.5 - 5.0 g/dL Final   AST 88/76/7974 17  15 - 41 U/L Final   ALT 09/11/2024 17  0 - 44 U/L Final   Alkaline Phosphatase 09/11/2024 77  38 - 126 U/L Final   Total Bilirubin 09/11/2024 0.3  0.0 - 1.2 mg/dL Final   GFR, Estimated 09/11/2024 59 (L)  >60 mL/min Final   Comment: (NOTE) Calculated using the CKD-EPI Creatinine Equation (2021)    Anion gap 09/11/2024 9  5 - 15 Final   Performed at [facility], [address] [place], [place] [address]   Magnesium  09/11/2024 1.6 (L)  1.7 - 2.4 mg/dL Final   Performed at [facility], [address], [place], [place] [address]   Alcohol, Ethyl (B) 09/11/2024 <15  <15 mg/dL Final   Comment:  (NOTE) For medical purposes only. Performed at [facility], [address], [place], [place] [address]    POC Amphetamine UR 09/11/2024 None Detected  NONE DETECTED (Cut Off Level 1000 ng/mL) Final   POC Secobarbital (BAR) 09/11/2024 None Detected  NONE DETECTED (Cut Off Level 300 ng/mL) Final   POC Buprenorphine (BUP) 09/11/2024 None Detected  NONE DETECTED (Cut Off Level 10 ng/mL) Final   POC Oxazepam (BZO) 09/11/2024 None Detected  NONE DETECTED (Cut Off Level 300 ng/mL) Final   POC Cocaine UR 09/11/2024 None Detected  NONE DETECTED (Cut Off Level 300 ng/mL) Final   POC Methamphetamine UR 09/11/2024 None Detected  NONE DETECTED (Cut Off Level 1000 ng/mL) Final   POC Morphine  09/11/2024 Positive (A)  NONE DETECTED (Cut Off Level  300 ng/mL) Final   POC Methadone UR 09/11/2024 None Detected  NONE DETECTED (Cut Off Level 300 ng/mL) Final   POC Oxycodone  UR 09/11/2024 Positive (A)  NONE DETECTED (Cut Off Level 100 ng/mL) Final   POC Marijuana UR 09/11/2024 None Detected  NONE DETECTED (Cut Off Level 50 ng/mL) Final  Admission on 08/20/2024, Discharged on 08/21/2024  Component Date Value Ref Range Status   Sodium 08/20/2024 137  135 - 145 mmol/L Final   Potassium 08/20/2024 3.8  3.5 - 5.1 mmol/L Final   Chloride 08/20/2024 100  98 - 111 mmol/L Final   CO2 08/20/2024 25  22 - 32 mmol/L Final   Glucose, Bld 08/20/2024 113 (H)  70 - 99 mg/dL Final   Glucose reference range applies only to samples taken after fasting for at least 8 hours.   BUN 08/20/2024 14  6 - 20 mg/dL Final   Creatinine, Ser 08/20/2024 1.21 (H)  0.44 - 1.00 mg/dL Final   Calcium  08/20/2024 9.2  8.9 - 10.3 mg/dL Final   Total Protein 88/98/7974 6.1 (L)  6.5 - 8.1 g/dL Final   Albumin  08/20/2024 3.3 (L)  3.5 - 5.0 g/dL Final   AST 88/98/7974 21  15 - 41 U/L Final   ALT 08/20/2024 19  0 - 44 U/L Final   Alkaline Phosphatase 08/20/2024 101  38 - 126 U/L Final   Total Bilirubin 08/20/2024 0.6  0.0 - 1.2 mg/dL Final    GFR, Estimated 08/20/2024 51 (L)  >60 mL/min Final   Comment: (NOTE) Calculated using the CKD-EPI Creatinine Equation (2021)    Anion gap 08/20/2024 12  5 - 15 Final   Performed at [facility], [address] [place], [place] [address]   Alcohol, Ethyl (B) 08/20/2024 <15  <15 mg/dL Final   Comment: (NOTE) For medical purposes only. Performed at [facility], [address], [place], [place] [address]    Opiates 08/20/2024 POSITIVE (A)  NONE DETECTED Final   Cocaine 08/20/2024 NONE DETECTED  NONE DETECTED Final   Benzodiazepines 08/20/2024 NONE DETECTED  NONE DETECTED Final   Amphetamines 08/20/2024 NONE DETECTED  NONE DETECTED Final   Tetrahydrocannabinol 08/20/2024 NONE DETECTED  NONE DETECTED Final   Barbiturates 08/20/2024 NONE DETECTED  NONE DETECTED Final   Comment: (NOTE) DRUG SCREEN FOR MEDICAL PURPOSES ONLY.  IF CONFIRMATION IS NEEDED FOR ANY PURPOSE, NOTIFY LAB WITHIN 5 DAYS.  LOWEST DETECTABLE LIMITS FOR URINE DRUG SCREEN Drug Class                     Cutoff (ng/mL) Amphetamine and metabolites    1000 Barbiturate and metabolites    200 Benzodiazepine                 200 Opiates and metabolites        300 Cocaine and metabolites        300 THC                            50 Performed at [facility], [address] [place], [place] [address]    WBC 08/20/2024 7.7  4.0 - 10.5 K/uL Final   RBC 08/20/2024 3.86 (L)  3.87 - 5.11 MIL/uL Final   Hemoglobin 08/20/2024 10.8 (L)  12.0 - 15.0 g/dL Final   HCT 88/98/7974 34.1 (L)  36.0 - 46.0 % Final   MCV 08/20/2024 88.3  80.0 - 100.0 fL  Final   MCH 08/20/2024 28.0  26.0 - 34.0 pg Final   MCHC 08/20/2024 31.7  30.0 - 36.0 g/dL Final   RDW 88/98/7974 14.1  11.5 - 15.5 % Final   Platelets 08/20/2024 415 (H)  150 - 400 K/uL Final   nRBC 08/20/2024 0.0  0.0 - 0.2 % Final   Neutrophils Relative % 08/20/2024 55  % Final   Neutro Abs 08/20/2024 4.3  1.7 - 7.7 K/uL Final   Lymphocytes Relative 08/20/2024 34   % Final   Lymphs Abs 08/20/2024 2.6  0.7 - 4.0 K/uL Final   Monocytes Relative 08/20/2024 8  % Final   Monocytes Absolute 08/20/2024 0.6  0.1 - 1.0 K/uL Final   Eosinophils Relative 08/20/2024 3  % Final   Eosinophils Absolute 08/20/2024 0.2  0.0 - 0.5 K/uL Final   Basophils Relative 08/20/2024 0  % Final   Basophils Absolute 08/20/2024 0.0  0.0 - 0.1 K/uL Final   Immature Granulocytes 08/20/2024 0  % Final   Abs Immature Granulocytes 08/20/2024 0.03  0.00 - 0.07 K/uL Final   Performed at [facility], [address] [place], [place] [address]  Admission on 08/13/2024, Discharged on 08/14/2024  Component Date Value Ref Range Status   Lipase 08/13/2024 18  11 - 51 U/L Final   Performed at [facility], [address], [place], [place] [address]   Sodium 08/13/2024 137  135 - 145 mmol/L Final   Potassium 08/13/2024 3.9  3.5 - 5.1 mmol/L Final   Chloride 08/13/2024 103  98 - 111 mmol/L Final   CO2 08/13/2024 18 (L)  22 - 32 mmol/L Final   Glucose, Bld 08/13/2024 104 (H)  70 - 99 mg/dL Final   Glucose reference range applies only to samples taken after fasting for at least 8 hours.   BUN 08/13/2024 23 (H)  6 - 20 mg/dL Final   Creatinine, Ser 08/13/2024 0.87  0.44 - 1.00 mg/dL Final   Calcium  08/13/2024 9.3  8.9 - 10.3 mg/dL Final   Total Protein 89/74/7974 6.3 (L)  6.5 - 8.1 g/dL Final   Albumin  08/13/2024 3.3 (L)  3.5 - 5.0 g/dL Final   AST 89/74/7974 25  15 - 41 U/L Final   ALT 08/13/2024 22  0 - 44 U/L Final   Alkaline Phosphatase 08/13/2024 86  38 - 126 U/L Final   Total Bilirubin 08/13/2024 0.8  0.0 - 1.2 mg/dL Final   GFR, Estimated 08/13/2024 >60  >60 mL/min Final   Comment: (NOTE) Calculated using the CKD-EPI Creatinine Equation (2021)    Anion gap 08/13/2024 16 (H)  5 - 15 Final   Performed at [facility], [address] [place], [place] [address]   WBC 08/13/2024 10.6 (H)  4.0 - 10.5 K/uL Final   RBC 08/13/2024 4.30  3.87 - 5.11 MIL/uL Final    Hemoglobin 08/13/2024 12.4  12.0 - 15.0 g/dL Final   HCT 89/74/7974 35.9 (L)  36.0 - 46.0 % Final   MCV 08/13/2024 83.5  80.0 - 100.0 fL Final   MCH 08/13/2024 28.8  26.0 - 34.0 pg Final   MCHC 08/13/2024 34.5  30.0 - 36.0 g/dL Final   RDW 89/74/7974 13.5  11.5 - 15.5 % Final   Platelets 08/13/2024 386  150 - 400 K/uL Final   nRBC 08/13/2024 0.0  0.0 - 0.2 % Final   Performed at [facility], [address], [place], [place] [address]  Color, Urine 08/14/2024 YELLOW  YELLOW Final   APPearance 08/14/2024 CLEAR  CLEAR Final   Specific Gravity, Urine 08/14/2024 >1.046 (H)  1.005 - 1.030 Final   pH 08/14/2024 6.0  5.0 - 8.0 Final   Glucose, UA 08/14/2024 NEGATIVE  NEGATIVE mg/dL Final   Hgb urine dipstick 08/14/2024 NEGATIVE  NEGATIVE Final   Bilirubin Urine 08/14/2024 NEGATIVE  NEGATIVE Final   Ketones, ur 08/14/2024 20 (A)  NEGATIVE mg/dL Final   Protein, ur 89/73/7974 NEGATIVE  NEGATIVE mg/dL Final   Nitrite 89/73/7974 NEGATIVE  NEGATIVE Final   Leukocytes,Ua 08/14/2024 NEGATIVE  NEGATIVE Final   Performed at [facility], [address] [place], [place] [address]   Alcohol, Ethyl (B) 08/13/2024 <15  <15 mg/dL Final   Comment: (NOTE) For medical purposes only. Performed at [facility], [address], [place], [place] [address]    Opiates 08/14/2024 POSITIVE (A)  NONE DETECTED Final   Cocaine 08/14/2024 NONE DETECTED  NONE DETECTED Final   Benzodiazepines 08/14/2024 POSITIVE (A)  NONE DETECTED Final   Amphetamines 08/14/2024 NONE DETECTED  NONE DETECTED Final   Tetrahydrocannabinol 08/14/2024 NONE DETECTED  NONE DETECTED Final   Barbiturates 08/14/2024 NONE DETECTED  NONE DETECTED Final   Comment: (NOTE) DRUG SCREEN FOR MEDICAL PURPOSES ONLY.  IF CONFIRMATION IS NEEDED FOR ANY PURPOSE, NOTIFY LAB WITHIN 5 DAYS.  LOWEST DETECTABLE LIMITS FOR URINE DRUG SCREEN Drug Class                     Cutoff (ng/mL) Amphetamine and metabolites    1000 Barbiturate and  metabolites    200 Benzodiazepine                 200 Opiates and metabolites        300 Cocaine and metabolites        300 THC                            50 Performed at [facility], [address] [place], [place] [address]   Appointment on 08/05/2024  Component Date Value Ref Range Status   AR max vel 08/05/2024 2.75  cm2 Final   AV Peak grad 08/05/2024 4.2  mmHg Final   Ao pk vel 08/05/2024 1.03  m/s Final   S' Lateral 08/05/2024 2.80  cm Final   Area-P 1/2 08/05/2024 3.39  cm2 Final   AV Area VTI 08/05/2024 2.76  cm2 Final   AV Mean grad 08/05/2024 2.0  mmHg Final   AV Area mean vel 08/05/2024 2.63  cm2 Final   Est EF 08/05/2024 55 - 60%   Final  Lab on 06/28/2024  Component Date Value Ref Range Status   Pro B Natriuretic peptide (BNP) 06/28/2024 57.0  0.0 - 100.0 pg/mL Final   Sodium 06/28/2024 140  135 - 145 mEq/L Final   Potassium 06/28/2024 3.7  3.5 - 5.1 mEq/L Final   Chloride 06/28/2024 101  96 - 112 mEq/L Final   CO2 06/28/2024 31  19 - 32 mEq/L Final   Glucose, Bld 06/28/2024 80  70 - 99 mg/dL Final   BUN 90/90/7974 10  6 - 23 mg/dL Final   Creatinine, Ser 06/28/2024 1.05  0.40 - 1.20 mg/dL Final   GFR 90/90/7974 57.65 (L)  >60.00 mL/min Final   Calculated using the CKD-EPI Creatinine Equation (2021)   Calcium  06/28/2024 9.5  8.4 - 10.5 mg/dL  Final  Office Visit on 06/21/2024  Component Date Value Ref Range Status   WBC 06/21/2024 6.3  4.0 - 10.5 K/uL Final   RBC 06/21/2024 3.86 (L)  3.87 - 5.11 Mil/uL Final   Platelets 06/21/2024 312.0  150.0 - 400.0 K/uL Final   Hemoglobin 06/21/2024 11.3 (L)  12.0 - 15.0 g/dL Final   HCT 90/97/7974 34.7 (L)  36.0 - 46.0 % Final   MCV 06/21/2024 89.8  78.0 - 100.0 fl Final   MCHC 06/21/2024 32.5  30.0 - 36.0 g/dL Final   RDW 90/97/7974 15.8 (H)  11.5 - 15.5 % Final   Sodium 06/21/2024 142  135 - 145 mEq/L Final   Potassium 06/21/2024 3.1 (L)  3.5 - 5.1 mEq/L Final   Chloride 06/21/2024 104  96 - 112 mEq/L Final    CO2 06/21/2024 28  19 - 32 mEq/L Final   Glucose, Bld 06/21/2024 65 (L)  70 - 99 mg/dL Final   BUN 90/97/7974 11  6 - 23 mg/dL Final   Creatinine, Ser 06/21/2024 1.01  0.40 - 1.20 mg/dL Final   GFR 90/97/7974 60.41  >60.00 mL/min Final   Calculated using the CKD-EPI Creatinine Equation (2021)   Calcium  06/21/2024 8.8  8.4 - 10.5 mg/dL Final   D-Dimer, Quant 06/21/2024 0.41  <0.50 mcg/mL FEU Final   Comment: Elevated D-dimer levels are associated with DIC, malignancies, inflammation, sepsis, surgery, trauma, and pregnancy. A D-dimer result less than 0.5 mcg/mL  FEU, in conjunction with a non-high clinical pre-test  probability assessment model, excludes deep vein thrombosis and pulmonary embolism. However, since  D-dimer values increase with age, the [employer] recommends an age-adjusted cut-off value in patients older than 50. The calculation for an age adjusted cut-off value is age (years) x 0.01 mcg/mL  FEU. For example, the cut-off for a 61 year old patient would be 72 x 0.01 mcg/mL FEU. . For additional information, please refer to http://education.[facility].com/faq/FAQ149 (This link is being provided for  informational/educational purposes only.)    Pro B Natriuretic peptide (BNP) 06/21/2024 216.0 (H)  0.0 - 100.0 pg/mL Final   TSH 06/21/2024 0.91  0.35 - 5.50 uIU/mL Final  Office Visit on 03/15/2024  Component Date Value Ref Range Status   WBC 03/15/2024 6.5  4.0 - 10.5 K/uL Final   RBC 03/15/2024 4.33  3.87 - 5.11 Mil/uL Final   Platelets 03/15/2024 333.0  150.0 - 400.0 K/uL Final   Hemoglobin 03/15/2024 12.7  12.0 - 15.0 g/dL Final   HCT 94/72/7974 38.9  36.0 - 46.0 % Final   MCV 03/15/2024 89.7  78.0 - 100.0 fl Final   MCHC 03/15/2024 32.6  30.0 - 36.0 g/dL Final   RDW 94/72/7974 14.0  11.5 - 15.5 % Final   Sodium 03/15/2024 138  135 - 145 mEq/L Final   Potassium 03/15/2024 3.8  3.5 - 5.1 mEq/L Final   Chloride 03/15/2024 96  96 - 112 mEq/L  Final   CO2 03/15/2024 35 (H)  19 - 32 mEq/L Final   Glucose, Bld 03/15/2024 76  70 - 99 mg/dL Final   BUN 94/72/7974 8  6 - 23 mg/dL Final   Creatinine, Ser 03/15/2024 0.98  0.40 - 1.20 mg/dL Final   Total Bilirubin 03/15/2024 0.3  0.2 - 1.2 mg/dL Final   Alkaline Phosphatase 03/15/2024 98  39 - 117 U/L Final   AST 03/15/2024 20  0 - 37 U/L Final   ALT 03/15/2024 14  0 - 35 U/L Final  Total Protein 03/15/2024 6.5  6.0 - 8.3 g/dL Final   Albumin  03/15/2024 3.9  3.5 - 5.2 g/dL Final   GFR 94/72/7974 62.76  >60.00 mL/min Final   Calculated using the CKD-EPI Creatinine Equation (2021)   Calcium  03/15/2024 9.7  8.4 - 10.5 mg/dL Final    Blood Alcohol level:  Lab Results  Component Value Date   [facility] <15 09/11/2024   ETH <15 08/20/2024    Metabolic Disorder Labs: Lab Results  Component Value Date   HGBA1C 5.0 01/22/2024   MPG 96.8 01/22/2024   No results found for: PROLACTIN Lab Results  Component Value Date   CHOL 225 (H) 01/22/2024   TRIG 285 (H) 01/22/2024   HDL 79 01/22/2024   CHOLHDL 2.8 01/22/2024   VLDL 57 (H) 01/22/2024   LDLCALC 89 01/22/2024    Therapeutic Lab Levels: No results found for: LITHIUM No results found for: VALPROATE No results found for: CBMZ  Physical Findings   AIMS    Flowsheet Row Video Visit from 07/13/2024 in [facility] Video Visit from 05/26/2024 in [facility] Video Visit from 03/11/2024 in [facility] Video Visit from 02/12/2024 in [facility]  AIMS Total Score 0 0 0 1   GAD-7    Flowsheet Row Video Visit from 07/13/2024 in [facility] Office Visit from 06/21/2024 in [facility] Video Visit from 05/26/2024 in [facility] Video Visit from 03/11/2024 in [facility] Video Visit from 02/12/2024 in [facility]  Total GAD-7 Score 14 14 15 8 9    PHQ2-9    Flowsheet Row ED from 09/11/2024 in [facility] ED from 08/20/2024 in [facility] Video Visit from 07/13/2024 in [facility] Office Visit from 06/21/2024 in [facility] Video Visit from 05/26/2024 in [facility]  PHQ-2 Total Score 4 4 5 3 5   PHQ-9 Total Score 12 14 20 19 22    Flowsheet Row ED from 09/11/2024 in [facility] Most recent reading at 09/11/2024  5:03 PM ED from 08/20/2024 in [facility] Emergency Department at [facility] Most recent reading at 08/20/2024  8:12 PM ED from 08/20/2024 in [facility] Most recent reading at 08/20/2024  3:32 PM  C-SSRS RISK CATEGORY No Risk No Risk No Risk     Musculoskeletal  Strength & Muscle Tone: within normal limits Gait & Station: normal Patient leans: N/A  Psychiatric Specialty Exam  Presentation  General Appearance:  Casual  Eye Contact: Fair  Speech: Clear and Coherent  Speech Volume: Normal  Handedness: Right   Mood and Affect  Mood: Anxious; Irritable  Affect: Congruent   Thought Process  Thought Processes: Coherent  Descriptions of Associations:Intact  Orientation:Full (Time, Place and Person)  Thought Content:WDL  Diagnosis of Schizophrenia or Schizoaffective disorder in past: No  Duration of Psychotic Symptoms: No data recorded  Hallucinations:Hallucinations: None  Ideas of Reference:None  Suicidal Thoughts:Suicidal Thoughts: No  Homicidal Thoughts:Homicidal Thoughts: No   Sensorium  Memory: Immediate Fair; Recent Fair; Remote Fair  Judgment: Fair  Insight: Fair   Art Therapist  Concentration: Fair  Attention Span: Fair  Recall: [employer] of  Knowledge: Fair  Language: Fair   Psychomotor Activity  Psychomotor Activity: Psychomotor Activity: Normal  Assets  Assets: Desire for Improvement; Communication Skills; Housing; Financial Resources/Insurance   Sleep  Sleep: Sleep: Fair  Estimated Sleeping Duration (Last 24 Hours): 10.00-12.00 hours (Due to Daylight Saving Time, the durations displayed may not accurately represent documentation during the time change interval)  Nutritional Assessment (For OBS and FBC admissions only) Has the patient had a weight loss or gain of 10 pounds or more in the last 3 months?: No Has the patient had a decrease in food intake/or appetite?: Yes Does the patient have dental problems?: No Does the patient have eating habits or behaviors that may be indicators of an eating disorder including binging or inducing vomiting?: Yes Has the patient recently lost weight without trying?: 0 Has the patient been eating poorly because of a decreased appetite?: 1 Malnutrition Screening Tool Score: 1    Physical Exam  Physical Exam HENT:     Head: Normocephalic and atraumatic.     Right Ear: Tympanic membrane normal.     Left Ear: Tympanic membrane normal.     Nose: Nose normal.  Eyes:     Extraocular Movements: Extraocular movements intact.     Pupils: Pupils are equal, round, and reactive to light.  Cardiovascular:     Rate and Rhythm: Normal rate.     Pulses: Normal pulses.  Pulmonary:     Effort: Pulmonary effort is normal.  Musculoskeletal:        General: Normal range of motion.     Cervical back: Normal range of motion and neck supple.  Neurological:     General: No focal deficit present.     Mental Status: She is alert and oriented to person, place, and time.  Psychiatric:        Thought Content: Thought content normal.    Review of Systems  Constitutional: Negative.   HENT: Negative.    Eyes: Negative.   Respiratory: Negative.    Cardiovascular: Negative.    Gastrointestinal: Negative.   Genitourinary: Negative.   Musculoskeletal: Negative.   Skin: Negative.   Neurological: Negative.   Endo/Heme/Allergies: Negative.   Psychiatric/Behavioral:  Positive for substance abuse.    Blood pressure (!) 99/59, pulse (!) 56, temperature 98.4 F (36.9 C), temperature source Oral, resp. rate 18, weight 97 lb (44 kg), SpO2 99%. Body mass index is 16.65 kg/m.  Treatment Plan Summary: Daily contact with patient to assess and evaluate symptoms and progress in treatment and Medication management  Randall Bouquet, NP 09/13/2024 1:07 PM

## 2024-09-13 NOTE — Care Management (Signed)
 FBC Care Management...  Writer met with patient to discuss discharge planning.  Patient declined inpatient and CD-IOP  Patient can discharge to home 09/14/2024 by 11:00 am  RN to arrange transportation  [address] [place] [address]   Writer coordinated individual counseling with MICAEL Prim for 09/27/2024 @ 2 p.m  [facility]  [address] [place] [address]  [place], [place] [address] [phone]
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
## 2024-09-14 DIAGNOSIS — R9431 Abnormal electrocardiogram [ECG] [EKG]: Secondary | ICD-10-CM | POA: Diagnosis not present

## 2024-09-14 DIAGNOSIS — G894 Chronic pain syndrome: Secondary | ICD-10-CM

## 2024-09-14 DIAGNOSIS — N289 Disorder of kidney and ureter, unspecified: Secondary | ICD-10-CM | POA: Diagnosis not present

## 2024-09-14 DIAGNOSIS — R636 Underweight: Secondary | ICD-10-CM | POA: Diagnosis not present

## 2024-09-14 DIAGNOSIS — M545 Low back pain, unspecified: Secondary | ICD-10-CM

## 2024-09-14 DIAGNOSIS — F1020 Alcohol dependence, uncomplicated: Secondary | ICD-10-CM | POA: Diagnosis not present

## 2024-09-14 MED ORDER — QUETIAPINE FUMARATE 400 MG PO TABS: 400.0000 mg | ORAL_TABLET | Freq: Every day | ORAL | 0 refills | Status: DC

## 2024-09-14 MED ORDER — VITAMIN D3 25 MCG PO TABS: 2000.0000 [IU] | ORAL_TABLET | Freq: Every day | ORAL | 0 refills | Status: DC

## 2024-09-14 MED ORDER — VITAMIN B-1 100 MG PO TABS: 100.0000 mg | ORAL_TABLET | Freq: Every day | ORAL | 0 refills | Status: AC

## 2024-09-14 MED ORDER — ADULT MULTIVITAMIN W/MINERALS CH: 1.0000 | ORAL_TABLET | Freq: Every day | ORAL | 0 refills | Status: AC

## 2024-09-14 MED ORDER — HYDROXYZINE HCL 25 MG PO TABS: 25.0000 mg | ORAL_TABLET | Freq: Four times a day (QID) | ORAL | 0 refills | Status: DC | PRN

## 2024-09-14 MED ORDER — METOPROLOL SUCCINATE ER 25 MG PO TB24: 12.5000 mg | ORAL_TABLET | Freq: Once | ORAL | 0 refills | Status: DC

## 2024-09-14 NOTE — ED Notes (Signed)
 Pt is took 3 hours to finish consuming half of her snack. Pt report, she is satisfied and doesn't want to eat again.

## 2024-09-14 NOTE — ED Notes (Signed)
 Patient discharged home per provider order. After Visit Summary (AVS) printed and given to patient. AVS reviewed with patient and all questions fully answered. Patient discharged in no acute distress, A& O x4 and ambulatory. Patient denied SI/HI, A/VH upon discharge. Patient verbalized understanding of all discharge instructions explained by staff, including follow up appointments, RX's and safety plan. Patient mood fair. Patient belongings returned to patient from locker #15 complete and intact. Patient escorted to lobby via staff for transport to destination. Safety maintained.

## 2024-09-14 NOTE — ED Notes (Signed)
 PRN Oxycodone  given due to patient reports of back pain rating 8/10. Medication administered with no complications. Environment secured, safety checks in place per facility policy.

## 2024-09-14 NOTE — ED Notes (Signed)
 Paitent provided breakfast.

## 2024-09-14 NOTE — ED Provider Notes (Signed)
 FBC/OBS ASAP Discharge Summary  Date and Time: 09/14/2024 9:30 AM  Name: Carla Martinez  MRN:  [id number]   Discharge Diagnoses:  Final diagnoses:  Alcohol use disorder, severe, dependence (HCC)  Nonspecific abnormal electrocardiogram (ECG) (EKG)  EKG abnormalities  Underweight  Renal insufficiency  Anorexia nervosa (HCC)  Chronic pain syndrome  Lumbar back pain    Subjective:  Carla Martinez is a 63 year old female who presented  to [facility] voluntarily,  requesting assistance with ongoing alcohol issues. Patient has a PMHx significant for MDD and alcohol abuse.   Stay Summary:  The patient was admitted to the Facility-based Crisis unit due to recent relapse on alcohol and for mood stabilization/to restart psychotropic medication. Patient was introduced to milieu activities and encouraged to participate in psycho-social groups. The plan at the time of admission was safety, stabilization and treatment od acute alcohol withdrawal.  Patient was treated with a combination of medication, therapy, and some educational activities during her crisis unit stay.  During his hospitalization, patient was evaluated for symptoms of acute alcohol withdrawal and mood instability. After a comprehensive psychiatric assessment, the decision was made to continue her home Fluoxetine , Trazodone  and Gabapentin , and restart Seroquel  400 mg at bedtime to address her mood and insomnia. Last EKG from 09/13/2024 revealed Qtc . This treatment led to a significant improvement in her overall mood and sleep. Throughout this stay, the patient showed stable progress with no notable complications or adverse effects from the medication. Her alcohol withdrawal symptoms fully resolved and CIWA score was zero upon discharge on 09/14/24. The patient was discharged in stable condition. Medications: Continue Fluoxetine  60mg  daily, Seroquel  400mg  PO at bedtime, Trazodone  300mg  PO at bedtime, Gabapentin  300mg  PO TID,  multivitamins. Follow-up: The patient will follow-up with outpatient MH clinic. Instructions: patient was advised to continue taking her medication as prescribed and to monitor for any potential side effects. Additionally, she was encouraged to maintain a regular sleep routine and avoid alcohol or illicit substances. Support: The patient was advised to stay in contact with her family and support system.   Total Time spent with patient: 45 minutes  Past Psychiatric History: depression, anxiety, alcohol use disorder.  Past Medical History: CHF, anorexia, GERD Family History: n/a Family Psychiatric History: n/a Social History: divorced, lives with mother and disabled son; financially stable.   Current Medications:  Current Facility-Administered Medications  Medication Dose Route Frequency Provider Last Rate Last Admin   alum & mag hydroxide-simeth (MAALOX/MYLANTA) 200-200-20 MG/5ML suspension 30 mL  30 mL Oral Q4H PRN Hobson, Fran E, NP       cholecalciferol  (VITAMIN D3) 25 MCG (1000 UNIT) tablet 2,000 Units  2,000 Units Oral Daily Randall, Veronique M, NP   2,000 Units at 09/13/24 9075   FLUoxetine  (PROZAC ) capsule 60 mg  60 mg Oral Daily Hobson, Fran E, NP   60 mg at 09/13/24 9075   furosemide  (LASIX ) tablet 20 mg  20 mg Oral Daily Hobson, Fran E, NP   20 mg at 09/13/24 9074   gabapentin  (NEURONTIN ) capsule 300 mg  300 mg Oral TID Hobson, Fran E, NP   300 mg at 09/13/24 2122   hydrOXYzine  (ATARAX ) tablet 25 mg  25 mg Oral Q6H PRN Randall Starlyn HERO, NP   25 mg at 09/12/24 1146   loperamide  (IMODIUM ) capsule 2-4 mg  2-4 mg Oral PRN Byungura, Veronique M, NP       LORazepam  (ATIVAN ) tablet 1 mg  1 mg Oral Q6H PRN Randall,  Starlyn HERO, NP   1 mg at 09/12/24 2133   magnesium  hydroxide (MILK OF MAGNESIA) suspension 30 mL  30 mL Oral Daily PRN Hobson, Fran E, NP       metoprolol  succinate (TOPROL -XL) 24 hr tablet 12.5 mg  12.5 mg Oral Once Hobson, Fran E, NP       multivitamin with  minerals tablet 1 tablet  1 tablet Oral Daily Byungura, Veronique M, NP   1 tablet at 09/13/24 9074   oxyCODONE  (Oxy IR/ROXICODONE ) immediate release tablet 5 mg  5 mg Oral Q8H PRN Hobson, Fran E, NP   5 mg at 09/14/24 0751   potassium chloride  SA (KLOR-CON  M) CR tablet 20 mEq  20 mEq Oral Daily Hobson, Fran E, NP   20 mEq at 09/13/24 9074   QUEtiapine  (SEROQUEL ) tablet 400 mg  400 mg Oral QHS Gottfried, Rhoda J, MD   400 mg at 09/13/24 2122   thiamine  (VITAMIN B1) tablet 100 mg  100 mg Oral Daily Byungura, Veronique M, NP   100 mg at 09/13/24 0925   traZODone  (DESYREL ) tablet 300 mg  300 mg Oral QHS Hobson, Fran E, NP   300 mg at 09/13/24 2123   Current Outpatient Medications  Medication Sig Dispense Refill   FLUoxetine  (PROZAC ) 20 MG capsule Take 3 capsules (60 mg total) by mouth daily. 90 capsule 1   furosemide  (LASIX ) 20 MG tablet Take 1 tablet (20 mg total) by mouth daily. 180 tablet 3   gabapentin  (NEURONTIN ) 300 MG capsule Take 1 capsule (300 mg total) by mouth 3 (three) times daily. 90 capsule 1   oxyCODONE  (ROXICODONE ) 5 MG immediate release tablet Take 1 tablet (5 mg total) by mouth every 8 (eight) hours as needed. 20 tablet 0   potassium chloride  SA (KLOR-CON  M) 20 MEQ tablet Take 1 tablet (20 mEq total) by mouth daily. 180 tablet 3   QUEtiapine  (SEROQUEL ) 400 MG tablet Take 1 tablet (400 mg total) by mouth at bedtime. 30 tablet 1   trazodone  (DESYREL ) 300 MG tablet Take 1 tablet (300 mg total) by mouth at bedtime. 30 tablet 1   cholecalciferol  (CHOLECALCIFEROL ) 25 MCG tablet Take 2 tablets (2,000 Units total) by mouth daily. 60 tablet 0   hydrOXYzine  (ATARAX ) 25 MG tablet Take 1 tablet (25 mg total) by mouth every 6 (six) hours as needed (anxiety/agitation or CIWA < or = 10). 30 tablet 0   metoprolol  succinate (TOPROL -XL) 25 MG 24 hr tablet Take 0.5 tablets (12.5 mg total) by mouth once for 1 dose. 0.5 tablet 0   Multiple Vitamin (MULTIVITAMIN WITH MINERALS) TABS tablet Take 1 tablet  by mouth daily. 90 tablet 0   QUEtiapine  (SEROQUEL ) 400 MG tablet Take 1 tablet (400 mg total) by mouth at bedtime. 30 tablet 0   thiamine  (VITAMIN B-1) 100 MG tablet Take 1 tablet (100 mg total) by mouth daily. 30 tablet 0    PTA Medications:  Facility Ordered Medications  Medication   alum & mag hydroxide-simeth (MAALOX/MYLANTA) 200-200-20 MG/5ML suspension 30 mL   magnesium  hydroxide (MILK OF MAGNESIA) suspension 30 mL   [COMPLETED] chlordiazePOXIDE  (LIBRIUM ) capsule 50 mg   FLUoxetine  (PROZAC ) capsule 60 mg   furosemide  (LASIX ) tablet 20 mg   gabapentin  (NEURONTIN ) capsule 300 mg   metoprolol  succinate (TOPROL -XL) 24 hr tablet 12.5 mg   oxyCODONE  (Oxy IR/ROXICODONE ) immediate release tablet 5 mg   potassium chloride  SA (KLOR-CON  M) CR tablet 20 mEq   traZODone  (DESYREL ) tablet 300 mg  thiamine  (VITAMIN B1) tablet 100 mg   multivitamin with minerals tablet 1 tablet   LORazepam  (ATIVAN ) tablet 1 mg   hydrOXYzine  (ATARAX ) tablet 25 mg   loperamide  (IMODIUM ) capsule 2-4 mg   cholecalciferol  (VITAMIN D3) 25 MCG (1000 UNIT) tablet 2,000 Units   [COMPLETED] LORazepam  (ATIVAN ) tablet 1 mg   QUEtiapine  (SEROQUEL ) tablet 400 mg   PTA Medications  Medication Sig   FLUoxetine  (PROZAC ) 20 MG capsule Take 3 capsules (60 mg total) by mouth daily.   gabapentin  (NEURONTIN ) 300 MG capsule Take 1 capsule (300 mg total) by mouth 3 (three) times daily.   trazodone  (DESYREL ) 300 MG tablet Take 1 tablet (300 mg total) by mouth at bedtime.   QUEtiapine  (SEROQUEL ) 400 MG tablet Take 1 tablet (400 mg total) by mouth at bedtime.   furosemide  (LASIX ) 20 MG tablet Take 1 tablet (20 mg total) by mouth daily.   potassium chloride  SA (KLOR-CON  M) 20 MEQ tablet Take 1 tablet (20 mEq total) by mouth daily.   oxyCODONE  (ROXICODONE ) 5 MG immediate release tablet Take 1 tablet (5 mg total) by mouth every 8 (eight) hours as needed.   metoprolol  succinate (TOPROL -XL) 25 MG 24 hr tablet Take 0.5 tablets (12.5 mg  total) by mouth once for 1 dose.   hydrOXYzine  (ATARAX ) 25 MG tablet Take 1 tablet (25 mg total) by mouth every 6 (six) hours as needed (anxiety/agitation or CIWA < or = 10).   QUEtiapine  (SEROQUEL ) 400 MG tablet Take 1 tablet (400 mg total) by mouth at bedtime.   cholecalciferol  (CHOLECALCIFEROL ) 25 MCG tablet Take 2 tablets (2,000 Units total) by mouth daily.   Multiple Vitamin (MULTIVITAMIN WITH MINERALS) TABS tablet Take 1 tablet by mouth daily.   thiamine  (VITAMIN B-1) 100 MG tablet Take 1 tablet (100 mg total) by mouth daily.       09/14/2024    8:54 AM 09/13/2024    1:06 PM 09/12/2024   10:12 AM  Depression screen PHQ 2/9  Decreased Interest 0 2 2  Down, Depressed, Hopeless 0 2 2  PHQ - 2 Score 0 4 4  Altered sleeping 1 1 1   Tired, decreased energy 1 2 2   Change in appetite 1 2 2   Feeling bad or failure about yourself  0 1 1  Trouble concentrating 0 1 1  Moving slowly or fidgety/restless 0 1 1  Suicidal thoughts 0 0 0  PHQ-9 Score 3 12 12   Difficult doing work/chores  Very difficult Very difficult    Flowsheet Row ED from 09/11/2024 in [facility] Most recent reading at 09/11/2024  5:03 PM ED from 08/20/2024 in [facility] Emergency Department at [facility] Most recent reading at 08/20/2024  8:12 PM ED from 08/20/2024 in [facility] Most recent reading at 08/20/2024  3:32 PM  C-SSRS RISK CATEGORY No Risk No Risk No Risk    Musculoskeletal  Strength & Muscle Tone: within normal limits Gait & Station: normal Patient leans: N/A  Psychiatric Specialty Exam  Presentation  General Appearance:  Casual  Eye Contact: Fair  Speech: Clear and Coherent  Speech Volume: Normal  Handedness: Right   Mood and Affect  Mood: Anxious; Irritable  Affect: Congruent   Thought Process  Thought Processes: Coherent  Descriptions of Associations:Intact  Orientation:Full (Time, Place and  Person)  Thought Content:WDL  Diagnosis of Schizophrenia or Schizoaffective disorder in past: No  Duration of Psychotic Symptoms: No data recorded  Hallucinations:Hallucinations: None  Ideas of  Reference:None  Suicidal Thoughts:Suicidal Thoughts: No  Homicidal Thoughts:Homicidal Thoughts: No   Sensorium  Memory: Immediate Fair; Recent Fair; Remote Fair  Judgment: Fair  Insight: Fair   Art Therapist  Concentration: Fair  Attention Span: Fair  Recall: [employer] of Knowledge: Fair  Language: Fair   Psychomotor Activity  Psychomotor Activity: Psychomotor Activity: Normal   Assets  Assets: Desire for Improvement; Communication Skills; Housing; Financial Resources/Insurance   Sleep  Sleep: Sleep: Fair  Estimated Sleeping Duration (Last 24 Hours): 11.50-13.00 hours  Nutritional Assessment (For OBS and FBC admissions only) Has the patient had a weight loss or gain of 10 pounds or more in the last 3 months?: No Has the patient had a decrease in food intake/or appetite?: Yes Does the patient have dental problems?: No Does the patient have eating habits or behaviors that may be indicators of an eating disorder including binging or inducing vomiting?: Yes Has the patient recently lost weight without trying?: 0 Has the patient been eating poorly because of a decreased appetite?: 1 Malnutrition Screening Tool Score: 1    Physical Exam  Physical Exam ROS Blood pressure 105/65, pulse 73, temperature 98.2 F (36.8 C), temperature source Oral, resp. rate 17, weight 44.5 kg, SpO2 98%. Body mass index is 16.82 kg/m.  Demographic Factors:  Divorced or widowed and Caucasian  Loss Factors: Loss of significant relationship and Decline in physical health  Historical Factors: Prior suicide attempts  Risk Reduction Factors:   Sense of responsibility to family, Living with another person, especially a relative, and Positive social support  Continued  Clinical Symptoms:  Anorexia Nervosa Alcohol/Substance Abuse/Dependencies  Cognitive Features That Contribute To Risk:  None    Suicide Risk:  Minimal: No identifiable suicidal ideation.  Patients presenting with no risk factors but with morbid ruminations; may be classified as minimal risk based on the severity of the depressive symptoms  Plan Of Care/Follow-up recommendations:  Emergency Contact Plan: Patient understands to call Suicide Hotline at 79, or Mobile Crisis at [phone], call 911, or go to the nearest ER as appropriate in case of thoughts of harm to self or others, or acute onset of intolerable side effects.  Disposition: discharged home  Neil Appl, MD 09/14/2024, 9:30 AM

## 2024-09-14 NOTE — Care Management (Signed)
 [facility] Care Management  Writer completed the taxi voucher and provided it to the RN to arrange transportation.

## 2024-09-21 ENCOUNTER — Other Ambulatory Visit (HOSPITAL_COMMUNITY): Payer: Self-pay | Admitting: Physician Assistant

## 2024-09-21 DIAGNOSIS — F411 Generalized anxiety disorder: Secondary | ICD-10-CM

## 2024-09-21 DIAGNOSIS — G4700 Insomnia, unspecified: Secondary | ICD-10-CM

## 2024-09-21 DIAGNOSIS — F339 Major depressive disorder, recurrent, unspecified: Secondary | ICD-10-CM

## 2024-09-27 ENCOUNTER — Ambulatory Visit (HOSPITAL_COMMUNITY): Admitting: Licensed Clinical Social Worker

## 2024-09-27 ENCOUNTER — Telehealth (HOSPITAL_COMMUNITY): Admitting: Physician Assistant

## 2024-09-27 ENCOUNTER — Encounter (HOSPITAL_COMMUNITY): Payer: Self-pay

## 2024-10-01 ENCOUNTER — Other Ambulatory Visit: Payer: Self-pay

## 2024-10-01 ENCOUNTER — Emergency Department

## 2024-10-01 ENCOUNTER — Observation Stay
Admission: EM | Admit: 2024-10-01 | Discharge: 2024-10-03 | Disposition: A | Attending: Internal Medicine | Admitting: Internal Medicine

## 2024-10-01 DIAGNOSIS — F32A Depression, unspecified: Secondary | ICD-10-CM

## 2024-10-01 DIAGNOSIS — I959 Hypotension, unspecified: Secondary | ICD-10-CM

## 2024-10-01 DIAGNOSIS — I5022 Chronic systolic (congestive) heart failure: Secondary | ICD-10-CM | POA: Diagnosis not present

## 2024-10-01 DIAGNOSIS — R079 Chest pain, unspecified: Secondary | ICD-10-CM | POA: Diagnosis present

## 2024-10-01 DIAGNOSIS — I110 Hypertensive heart disease with heart failure: Secondary | ICD-10-CM | POA: Diagnosis not present

## 2024-10-01 DIAGNOSIS — F17210 Nicotine dependence, cigarettes, uncomplicated: Secondary | ICD-10-CM | POA: Diagnosis not present

## 2024-10-01 DIAGNOSIS — I2510 Atherosclerotic heart disease of native coronary artery without angina pectoris: Secondary | ICD-10-CM

## 2024-10-01 DIAGNOSIS — F1011 Alcohol abuse, in remission: Secondary | ICD-10-CM | POA: Insufficient documentation

## 2024-10-01 DIAGNOSIS — R9431 Abnormal electrocardiogram [ECG] [EKG]: Principal | ICD-10-CM

## 2024-10-01 DIAGNOSIS — F419 Anxiety disorder, unspecified: Secondary | ICD-10-CM | POA: Insufficient documentation

## 2024-10-01 DIAGNOSIS — F1010 Alcohol abuse, uncomplicated: Secondary | ICD-10-CM | POA: Diagnosis not present

## 2024-10-01 DIAGNOSIS — I4581 Long QT syndrome: Secondary | ICD-10-CM | POA: Diagnosis not present

## 2024-10-01 LAB — CBC
HCT: 36.7 % (ref 36.0–46.0)
HCT: 33.4 % — ABNORMAL LOW (ref 36.0–46.0)
Hemoglobin: 11.7 g/dL — ABNORMAL LOW (ref 12.0–15.0)
Hemoglobin: 11.1 g/dL — ABNORMAL LOW (ref 12.0–15.0)
MCH: 28.5 pg (ref 26.0–34.0)
MCH: 29.1 pg (ref 26.0–34.0)
MCHC: 31.9 g/dL (ref 30.0–36.0)
MCHC: 33.2 g/dL (ref 30.0–36.0)
MCV: 89.3 fL (ref 80.0–100.0)
MCV: 87.4 fL (ref 80.0–100.0)
Platelets: 328 K/uL (ref 150–400)
Platelets: 288 K/uL (ref 150–400)
RBC: 4.11 MIL/uL (ref 3.87–5.11)
RBC: 3.82 MIL/uL — ABNORMAL LOW (ref 3.87–5.11)
RDW: 14.5 % (ref 11.5–15.5)
RDW: 14.5 % (ref 11.5–15.5)
WBC: 8.6 K/uL (ref 4.0–10.5)
WBC: 10 K/uL (ref 4.0–10.5)
nRBC: 0 % (ref 0.0–0.2)
nRBC: 0 % (ref 0.0–0.2)

## 2024-10-01 LAB — BASIC METABOLIC PANEL WITH GFR
Anion gap: 16 — ABNORMAL HIGH (ref 5–15)
BUN: 36 mg/dL — ABNORMAL HIGH (ref 8–23)
CO2: 21 mmol/L — ABNORMAL LOW (ref 22–32)
Calcium: 8.7 mg/dL — ABNORMAL LOW (ref 8.9–10.3)
Chloride: 100 mmol/L (ref 98–111)
Creatinine, Ser: 1.03 mg/dL — ABNORMAL HIGH (ref 0.44–1.00)
GFR, Estimated: >60 mL/min (ref >60)
Glucose, Bld: 105 mg/dL — ABNORMAL HIGH (ref 70–99)
Potassium: 3.4 mmol/L — ABNORMAL LOW (ref 3.5–5.1)
Sodium: 137 mmol/L (ref 135–145)

## 2024-10-01 LAB — CREATININE, SERUM
Creatinine, Ser: 0.96 mg/dL (ref 0.44–1.00)
GFR, Estimated: >60 mL/min (ref >60)

## 2024-10-01 LAB — PRO BRAIN NATRIURETIC PEPTIDE: Pro Brain Natriuretic Peptide: 150 pg/mL (ref <300.0)

## 2024-10-01 LAB — TROPONIN T, HIGH SENSITIVITY
Troponin T High Sensitivity: 33 ng/L — ABNORMAL HIGH (ref 0–19)
Troponin T High Sensitivity: 31 ng/L — ABNORMAL HIGH (ref 0–19)
Troponin T High Sensitivity: 27 ng/L — ABNORMAL HIGH (ref 0–19)
Troponin T High Sensitivity: 23 ng/L — ABNORMAL HIGH (ref 0–19)

## 2024-10-01 LAB — MAGNESIUM: Magnesium: 1.5 mg/dL — ABNORMAL LOW (ref 1.7–2.4)

## 2024-10-01 MED ORDER — VITAMIN D 25 MCG (1000 UNIT) PO TABS
2000.0000 [IU] | ORAL_TABLET | Freq: Every day | ORAL | Status: DC
  Administered 2024-10-02 – 2024-10-03 (×2): 2000 [IU] via ORAL
  Filled 2024-10-01 (×2): qty 2

## 2024-10-01 MED ORDER — MAGNESIUM SULFATE 2 GM/50ML IV SOLN: 2.0000 g | Freq: Once | INTRAVENOUS | Status: DC

## 2024-10-01 MED ORDER — TRAZODONE HCL 100 MG PO TABS
300.0000 mg | ORAL_TABLET | Freq: Every day | ORAL | Status: DC
  Administered 2024-10-01: 300 mg via ORAL
  Filled 2024-10-01: qty 3

## 2024-10-01 MED ORDER — QUETIAPINE FUMARATE 200 MG PO TABS: 400.0000 mg | ORAL_TABLET | Freq: Every day | ORAL | Status: DC

## 2024-10-01 MED ORDER — MAGNESIUM SULFATE 2 GM/50ML IV SOLN
2.0000 g | Freq: Once | INTRAVENOUS | Status: AC
  Administered 2024-10-01: 2 g via INTRAVENOUS
  Filled 2024-10-01: qty 50

## 2024-10-01 MED ORDER — ORAL CARE MOUTH RINSE: 15.0000 mL | OROMUCOSAL | Status: DC | PRN

## 2024-10-01 MED ORDER — NITROGLYCERIN 0.4 MG SL SUBL: 0.4000 mg | SUBLINGUAL_TABLET | SUBLINGUAL | Status: DC | PRN

## 2024-10-01 MED ORDER — ADULT MULTIVITAMIN W/MINERALS CH
1.0000 | ORAL_TABLET | Freq: Every day | ORAL | Status: DC
  Administered 2024-10-02 – 2024-10-03 (×2): 1 via ORAL
  Filled 2024-10-01 (×2): qty 1

## 2024-10-01 MED ORDER — GABAPENTIN 300 MG PO CAPS
300.0000 mg | ORAL_CAPSULE | Freq: Three times a day (TID) | ORAL | Status: DC
  Administered 2024-10-01 – 2024-10-03 (×6): 300 mg via ORAL
  Filled 2024-10-01 (×6): qty 1

## 2024-10-01 MED ORDER — QUETIAPINE FUMARATE 25 MG PO TABS: 100.0000 mg | ORAL_TABLET | Freq: Two times a day (BID) | ORAL | Status: DC

## 2024-10-01 MED ORDER — ACETAMINOPHEN 325 MG PO TABS
650.0000 mg | ORAL_TABLET | ORAL | Status: DC | PRN
  Administered 2024-10-02: 650 mg via ORAL
  Filled 2024-10-01: qty 2

## 2024-10-01 MED ORDER — OXYCODONE HCL 5 MG PO TABS
5.0000 mg | ORAL_TABLET | Freq: Three times a day (TID) | ORAL | Status: DC | PRN
  Administered 2024-10-01 – 2024-10-03 (×4): 5 mg via ORAL
  Filled 2024-10-01 (×4): qty 1

## 2024-10-01 MED ORDER — POTASSIUM CHLORIDE CRYS ER 20 MEQ PO TBCR
40.0000 meq | EXTENDED_RELEASE_TABLET | Freq: Once | ORAL | Status: AC
  Administered 2024-10-01: 40 meq via ORAL
  Filled 2024-10-01: qty 2

## 2024-10-01 MED ORDER — MORPHINE SULFATE (PF) 2 MG/ML IV SOLN: 2.0000 mg | INTRAVENOUS | Status: DC | PRN

## 2024-10-01 MED ORDER — ASPIRIN 81 MG PO TBEC
81.0000 mg | DELAYED_RELEASE_TABLET | Freq: Every day | ORAL | Status: DC
  Administered 2024-10-02 – 2024-10-03 (×2): 81 mg via ORAL
  Filled 2024-10-01 (×2): qty 1

## 2024-10-01 MED ORDER — MELATONIN 5 MG PO TABS
5.0000 mg | ORAL_TABLET | Freq: Every evening | ORAL | Status: DC | PRN
  Administered 2024-10-01 – 2024-10-02 (×2): 5 mg via ORAL
  Filled 2024-10-01 (×2): qty 1

## 2024-10-01 MED ORDER — METOPROLOL SUCCINATE ER 25 MG PO TB24
12.5000 mg | ORAL_TABLET | Freq: Every day | ORAL | Status: DC
  Administered 2024-10-02 – 2024-10-03 (×2): 12.5 mg via ORAL
  Filled 2024-10-01 (×2): qty 1

## 2024-10-01 MED ORDER — ALPRAZOLAM 0.25 MG PO TABS
0.2500 mg | ORAL_TABLET | Freq: Two times a day (BID) | ORAL | Status: DC | PRN
  Administered 2024-10-02: 0.25 mg via ORAL
  Filled 2024-10-01: qty 1

## 2024-10-01 MED ORDER — OXYCODONE-ACETAMINOPHEN 5-325 MG PO TABS
1.0000 | ORAL_TABLET | Freq: Once | ORAL | Status: AC
  Administered 2024-10-01: 1 via ORAL
  Filled 2024-10-01: qty 1

## 2024-10-01 MED ORDER — ENOXAPARIN SODIUM 30 MG/0.3ML IJ SOSY
30.0000 mg | PREFILLED_SYRINGE | Freq: Every day | INTRAMUSCULAR | Status: DC
  Administered 2024-10-01: 30 mg via SUBCUTANEOUS
  Filled 2024-10-01 (×2): qty 0.3

## 2024-10-01 MED ORDER — FLUOXETINE HCL 20 MG PO CAPS: 60.0000 mg | ORAL_CAPSULE | Freq: Every day | ORAL | Status: DC

## 2024-10-01 MED ORDER — POTASSIUM CHLORIDE IN NACL 20-0.9 MEQ/L-% IV SOLN
INTRAVENOUS | Status: AC
  Filled 2024-10-01 (×4): qty 1000

## 2024-10-01 MED ORDER — POTASSIUM CHLORIDE CRYS ER 20 MEQ PO TBCR
20.0000 meq | EXTENDED_RELEASE_TABLET | Freq: Every day | ORAL | Status: DC
  Administered 2024-10-02: 20 meq via ORAL
  Filled 2024-10-01: qty 1

## 2024-10-01 MED ORDER — QUETIAPINE FUMARATE 200 MG PO TABS
400.0000 mg | ORAL_TABLET | Freq: Every day | ORAL | Status: DC
  Administered 2024-10-01: 400 mg via ORAL
  Filled 2024-10-01: qty 2

## 2024-10-01 MED ORDER — HYDROXYZINE HCL 25 MG PO TABS: 25.0000 mg | ORAL_TABLET | Freq: Four times a day (QID) | ORAL | Status: DC | PRN

## 2024-10-01 MED ORDER — THIAMINE MONONITRATE 100 MG PO TABS
100.0000 mg | ORAL_TABLET | Freq: Every day | ORAL | Status: DC
  Administered 2024-10-02 – 2024-10-03 (×2): 100 mg via ORAL
  Filled 2024-10-01 (×2): qty 1

## 2024-10-01 NOTE — Assessment & Plan Note (Signed)
 The she was counseled for cessation. - Will continue her thiamine  and multivitamins and add folic acid .

## 2024-10-01 NOTE — ED Notes (Signed)
 Pt advised she drinks Nyquil daily to avoid withdrawals. But denies drinking actual alcohol.

## 2024-10-01 NOTE — ED Notes (Signed)
 ED Provider at bedside.

## 2024-10-01 NOTE — Progress Notes (Signed)
 PHARMACIST - PHYSICIAN COMMUNICATION  CONCERNING:  Enoxaparin  (Lovenox ) for DVT Prophylaxis    RECOMMENDATION: Patient was prescribed enoxaprin 40mg  q24 hours for VTE prophylaxis.   Filed Weights   10/01/24 1527  Weight: 42.6 kg (94 lb)    Body mass index is 17.19 kg/m.  Estimated Creatinine Clearance: 38.6 mL/min (A) (by C-G formula based on SCr of 1.03 mg/dL (H)).   Patient is candidate for enoxaparin  30mg  every 24 hours based on CrCl <69ml/min or Weight <45kg  DESCRIPTION: Pharmacy has adjusted enoxaparin  dose per [facility] policy.  Patient is now receiving enoxaparin  30 mg every 24 hours    Annabella LOISE Banks, PharmD Clinical Pharmacist  10/01/2024 7:53 PM

## 2024-10-01 NOTE — ED Provider Notes (Signed)
 [facility] Provider Note    Event Date/Time   First MD Initiated Contact with Patient 10/01/24 1645     (approximate)   History   Chest Pain   HPI  Carla Martinez is a 63 y.o. female with past medical history of chronic hypotension, CHF, lumbar fractures, presenting to the emergency department complaining of chest pain which has been ongoing on and off for a long time but has been worsening over the last 3 to 4 days.  She reports that the pain is nonradiating and feels as if her heart is going to jump out of her chest.  She also reports that it feels as if her joints are all locking up at the same time and causing her to fall.  She reports some associated shortness of breath.  Denies any abdominal pain, nausea, vomiting, diarrhea.  She does report occasional episodes of dizziness.   Physical Exam   Triage Vital Signs: ED Triage Vitals  Encounter Vitals Group     BP 10/01/24 1528 94/70     Girls Systolic BP Percentile --      Girls Diastolic BP Percentile --      Boys Systolic BP Percentile --      Boys Diastolic BP Percentile --      Pulse Rate 10/01/24 1523 85     Resp 10/01/24 1523 20     Temp 10/01/24 1523 98.3 F (36.8 C)     Temp Source 10/01/24 1523 Oral     SpO2 10/01/24 1523 97 %     Weight 10/01/24 1527 94 lb (42.6 kg)     Height 10/01/24 1527 5' 2 (1.575 m)     Head Circumference --      Peak Flow --      Pain Score 10/01/24 1526 6     Pain Loc --      Pain Education --      Exclude from Growth Chart --     Most recent vital signs: Vitals:   10/01/24 1700 10/01/24 1800  BP: 94/67 101/66  Pulse: 64 72  Resp:    Temp:    SpO2:       General: Awake, no distress.  CV:  Good peripheral perfusion.  Resp:  Normal effort.  Abd:  No distention.  Other:  No edema to bilateral lower extremities   ED Results / Procedures / Treatments   Labs (all labs ordered are listed, but only abnormal results are displayed) Labs  Reviewed  BASIC METABOLIC PANEL WITH GFR - Abnormal; Notable for the following components:      Result Value   Potassium 3.4 (*)    CO2 21 (*)    Glucose, Bld 105 (*)    BUN 36 (*)    Creatinine, Ser 1.03 (*)    Calcium  8.7 (*)    Anion gap 16 (*)    All other components within normal limits  CBC - Abnormal; Notable for the following components:   Hemoglobin 11.7 (*)    All other components within normal limits  TROPONIN T, HIGH SENSITIVITY - Abnormal; Notable for the following components:   Troponin T High Sensitivity 33 (*)    All other components within normal limits  TROPONIN T, HIGH SENSITIVITY     EKG  ED ECG REPORT I, Reche CHRISTELLA Leventhal, the attending physician, personally viewed and interpreted this ECG.  Date: 10/01/2024 Rate: 84 bpm Rhythm: sinus rhythm QRS Axis: normal Intervals: Long QT interval  ST/T Wave abnormalities: normal Narrative Interpretation: no evidence of acute ischemia    RADIOLOGY Chest x-ray does not show any acute findings by my interpretation    PROCEDURES:  Critical Care performed: No  Procedures   MEDICATIONS ORDERED IN ED: Medications - No data to display   IMPRESSION / MDM / ASSESSMENT AND PLAN / ED COURSE  I reviewed the triage vital signs and the nursing notes.                              Differential diagnosis includes, but is not limited to, cardiac arrhythmias, electrolyte abnormalities, MI, PE  Patient's presentation is most consistent with acute presentation with potential threat to life or bodily function.  Patient is a 63 year old with past medical history of CHF presenting to the emergency department complaining of intermittent chest pain.  On workup the patient is noted to have a significantly prolonged QT interval at 650.  Potassium is noted to be low as well as her magnesium .  Repletion ordered.  I discussed with patient admission for further workup and treatment and she is agreeable.  I discussed the  patient's case with hospitalist who is in agreement with plan for admission.      FINAL CLINICAL IMPRESSION(S) / ED DIAGNOSES   Final diagnoses:  Prolonged QT interval     Rx / DC Orders   ED Discharge Orders     None        Note:  This document was prepared using Dragon voice recognition software and may include unintentional dictation errors.   Monseratt Ledin M, MD 10/02/24 [phone]

## 2024-10-01 NOTE — Assessment & Plan Note (Signed)
-   She responded to IV hydration and will continue continue with IV normal saline with added potassium chloride .
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
## 2024-10-01 NOTE — Assessment & Plan Note (Signed)
-   Will continue her antidepressant therapy as well as antianxiety medications

## 2024-10-01 NOTE — Assessment & Plan Note (Signed)
-   The patient will be admitted to an observation cardiac telemetry bed. - Will follow serial troponins and EKGs. - The patient will be placed on aspirin as well as p.r.n. sublingual nitroglycerin and morphine sulfate for pain. - We will obtain a cardiology consult in a.m. for further cardiac risk stratification. - I notified [facility] group about the patient

## 2024-10-01 NOTE — ED Triage Notes (Signed)
 Pt to ED for chest on and off for a long time and worse since 3-4 days ago, non radiating and also like my heart is thumping out of my chest. Also states all of her her joints have been locking up from time to time and causing her to fall. Last fall was in October, unsure if was seen at that time. States also has lumbar vertebral fx since late Oct.  Respirations are unlabored, skin dry.

## 2024-10-01 NOTE — Assessment & Plan Note (Signed)
-   She has a history of nonobstructive coronary artery disease. - She replaced on aspirin  as well as as needed sublingual nitroglycerin  and morphine  sulfate for pain. - We will continue her beta-blocker therapy with Toprol -XL.

## 2024-10-02 ENCOUNTER — Observation Stay
Admit: 2024-10-02 | Discharge: 2024-10-02 | Disposition: A | Discharge status: Home / Self Care | Attending: Physician Assistant | Admitting: Physician Assistant

## 2024-10-02 DIAGNOSIS — R079 Chest pain, unspecified: Secondary | ICD-10-CM

## 2024-10-02 DIAGNOSIS — I340 Nonrheumatic mitral (valve) insufficiency: Secondary | ICD-10-CM | POA: Diagnosis not present

## 2024-10-02 DIAGNOSIS — I509 Heart failure, unspecified: Secondary | ICD-10-CM | POA: Diagnosis not present

## 2024-10-02 DIAGNOSIS — I2510 Atherosclerotic heart disease of native coronary artery without angina pectoris: Secondary | ICD-10-CM | POA: Diagnosis not present

## 2024-10-02 DIAGNOSIS — E7800 Pure hypercholesterolemia, unspecified: Secondary | ICD-10-CM | POA: Diagnosis not present

## 2024-10-02 DIAGNOSIS — I4581 Long QT syndrome: Secondary | ICD-10-CM | POA: Diagnosis not present

## 2024-10-02 LAB — LIPID PANEL
Cholesterol: 220 mg/dL — ABNORMAL HIGH (ref 0–200)
HDL: 41 mg/dL (ref >40)
LDL Cholesterol: 140 mg/dL — ABNORMAL HIGH (ref 0–99)
Total CHOL/HDL Ratio: 5.4 ratio
Triglycerides: 196 mg/dL — ABNORMAL HIGH (ref <150)
VLDL: 39 mg/dL (ref 0–40)

## 2024-10-02 LAB — ECHOCARDIOGRAM COMPLETE
AR max vel: 3.01 cm2
AV Peak grad: 4.2 mmHg
Ao pk vel: 1.03 m/s
Area-P 1/2: 2.73 cm2
Calc EF: 54.4 %
Height: 62 in
S' Lateral: 2.95 cm
Single Plane A2C EF: 51.4 %
Single Plane A4C EF: 56.8 %
Weight: 1504 [oz_av]

## 2024-10-02 LAB — MAGNESIUM: Magnesium: 1.8 mg/dL (ref 1.7–2.4)

## 2024-10-02 LAB — POTASSIUM: Potassium: 4.1 mmol/L (ref 3.5–5.1)

## 2024-10-02 MED ORDER — MAGNESIUM SULFATE 2 GM/50ML IV SOLN
2.0000 g | Freq: Once | INTRAVENOUS | Status: AC
  Administered 2024-10-02: 2 g via INTRAVENOUS
  Filled 2024-10-02: qty 50

## 2024-10-02 MED ORDER — ATORVASTATIN CALCIUM 20 MG PO TABS
40.0000 mg | ORAL_TABLET | Freq: Every day | ORAL | Status: DC
  Administered 2024-10-02 – 2024-10-03 (×2): 40 mg via ORAL
  Filled 2024-10-02 (×2): qty 2

## 2024-10-02 MED ORDER — PERFLUTREN LIPID MICROSPHERE
1.0000 mL | INTRAVENOUS | Status: AC | PRN
  Administered 2024-10-02: 3 mL via INTRAVENOUS

## 2024-10-02 MED ORDER — POLYETHYLENE GLYCOL 3350 17 G PO PACK
17.0000 g | PACK | Freq: Every day | ORAL | Status: DC
  Administered 2024-10-02 – 2024-10-03 (×2): 17 g via ORAL
  Filled 2024-10-02 (×2): qty 1

## 2024-10-02 MED ORDER — MAGNESIUM HYDROXIDE 400 MG/5ML PO SUSP: 30.0000 mL | Freq: Every day | ORAL | Status: DC | PRN

## 2024-10-02 MED ORDER — ATORVASTATIN CALCIUM 20 MG PO TABS: 20.0000 mg | ORAL_TABLET | Freq: Every day | ORAL | Status: DC
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
## 2024-10-02 NOTE — Consult Note (Signed)
 PHARMACY CONSULT NOTE - ELECTROLYTES  Pharmacy Consult for Electrolyte Monitoring and Replacement   Recent Labs: Potassium (mmol/L)  Date Value  10/01/2024 3.4 (L)   Magnesium  (mg/dL)  Date Value  87/86/7974 1.5 (L)   Calcium  (mg/dL)  Date Value  87/86/7974 8.7 (L)   Albumin  (g/dL)  Date Value  88/76/7974 3.4 (L)   Phosphorus (mg/dL)  Date Value  94/94/7974 3.2   Sodium (mmol/L)  Date Value  10/01/2024 137   Height: 5' 2 (157.5 cm) Weight: 42.6 kg (94 lb) IBW/kg (Calculated) : 50.1 Estimated Creatinine Clearance: 41.4 mL/min (by C-G formula based on SCr of 0.96 mg/dL).  Assessment  Carla Martinez is a 63 y.o. female presenting with chest pain. PMH significant for anxiety, depression, paroxysmal atrial fibrillation, diastolic CHF with most recent 2D echo on 08/05/2024 revealing EF of 85 to 60% with normal diastolic function, COPD, GERD, nonobstructive coronary artery disease, alcohol abuse . Pharmacy has been consulted to monitor and replace electrolytes.  Diet: NPO, sips w/ meds MIVF: NS + 20 mEq K/L @ 100 mL/hr Pertinent medications: Kcl 20 mEq Martinez daily  Goal of Therapy: Electrolytes within normal limits  Plan:  Mg 1.8, magnesium  sulfate 2 g IV x 1 BMP tomorrow  Thank you for allowing pharmacy to be a part of this patients care.  Carla Martinez 10/02/2024 9:58 AM

## 2024-10-02 NOTE — Progress Notes (Signed)
 Echocardiogram 2D Echocardiogram has been performed.  Halee Glynn N Verlyn Dannenberg,RDCS 10/02/2024, 2:19 PM

## 2024-10-02 NOTE — Progress Notes (Signed)
 1      PROGRESS NOTE    Carla Martinez  FMW:[id number] DOB: 1961/05/07 DOA: 10/01/2024 PCP: Wendee Lynwood HERO, NP   Brief Narrative:   63 y.o. female with medical history significant for anxiety, depression, paroxysmal atrial fibrillation, diastolic CHF with most recent 2D echo on 08/05/2024 revealing EF of 85 to 60% with normal diastolic function, COPD, GERD, nonobstructive coronary artery disease, on alcohol abuse, who presented to the emergency room with acute onset of midsternal chest pain felt as a pressure and squeezing and graded 8/10 in severity with no nausea or vomiting or diaphoresis.   12/14: Cardiology consult   Assessment & Plan:   Principal Problem:   Chest pain Active Problems:   Hypotension   Coronary artery disease   Anxiety and depression   History of alcohol abuse   * Chest pain - Monitor on telemetry - Pending echo -May need stress test versus cardiac cath depending on cardiology evaluation or consider outpatient coronary CT   Hypotension - Low-dose Toprol  XL 12.5 mg daily for now.  Blood pressure is stable  Heart failure with improved ejection fraction Her lowest EF was 25 to 30% in 2022 followed by 40 to 45% in 2024 recent echo showed EF of 55 to 60% in October 2025 Pending echo this admission Monitor on telemetry, cardiology following   Nonobstructive coronary artery disease - Continue aspirin  81 mg daily, Toprol  XL 12.5 mg daily -I have added Lipitor 40 mg daily  Prolonged QT measuring 653 ms Holding Seroquel , trazodone , hydroxyzine  and fluoxetine  -Avoid QT prolonging medications Replete electrolytes aggressively, consulted pharmacist for electrolyte management   History of alcohol abuse The she was counseled for cessation on admission - Continue thiamine  and multivitamins and add folic acid .   Anxiety and depression - Hold her psych medication considering QT prolongation   DVT prophylaxis: (Lovenox  enoxaparin  (LOVENOX ) injection 30  mg Start: 10/01/24 2200     Code Status: (Full code Family Communication: None at bedside Disposition Plan: Possible discharge in next 2 to 3 days depending on clinical condition and cardiology evaluation   Consultants:  Cardiology     Subjective:  Feeling sleepy  Objective: Vitals:   10/02/24 0434 10/02/24 0500 10/02/24 0834 10/02/24 1133  BP: 108/62  (!) 92/51 98/65  Pulse: 61  64 65  Resp: 18  18 18   Temp: 98.6 F (37 C)  98.2 F (36.8 C) 97.9 F (36.6 C)  TempSrc:      SpO2: (!) 89% 92% 90% 95%  Weight:      Height:        Intake/Output Summary (Last 24 hours) at 10/02/2024 1457 Last data filed at 10/02/2024 1047 Gross per 24 hour  Intake 1176.39 ml  Output --  Net 1176.39 ml   Filed Weights   10/01/24 1527  Weight: 42.6 kg    Examination:  General exam: Appears calm and comfortable  Respiratory system: Clear to auscultation. Respiratory effort normal. Cardiovascular system: S1 & S2 heard, 2/6 systolic ejection murmur present Gastrointestinal system: Abdomen is soft, benign Central nervous system: sleepy. No focal neurological deficits. Extremities: Symmetric 5 x 5 power. Skin: No rashes, lesions or ulcers   Data Reviewed: I have personally reviewed following labs and imaging studies  CBC: Recent Labs  Lab 10/01/24 1529 10/01/24 2016  WBC 8.6 10.0  HGB 11.7* 11.1*  HCT 36.7 33.4*  MCV 89.3 87.4  PLT 328 288   Basic Metabolic Panel: Recent Labs  Lab 10/01/24 1529 10/01/24 2016  10/02/24 1204  NA 137  --   --   K 3.4*  --  4.1  CL 100  --   --   CO2 21*  --   --   GLUCOSE 105*  --   --   BUN 36*  --   --   CREATININE 1.03* 0.96  --   CALCIUM  8.7*  --   --   MG 1.5*  --  1.8    BNP (last 3 results) Recent Labs    06/21/24 1236 06/28/24 1234 10/01/24 2016  PROBNP 216.0* 57.0 150.0    Lipid Profile: Recent Labs    10/02/24 0337  CHOL 220*  HDL 41  LDLCALC 140*  TRIG 196*  CHOLHDL 5.4    Radiology Studies: DG  Chest 2 View Result Date: 10/01/2024 EXAM: 2 VIEW(S) XRAY OF THE CHEST 10/01/2024 03:44:00 PM COMPARISON: Chest x-ray 06/21/2024. CT chest 07/01/2024. CLINICAL HISTORY: CP FINDINGS: LUNGS AND PLEURA: Linear band of scarring in the right lower lobe which is unchanged from prior CT. No focal pulmonary opacity. No pleural effusion. No pneumothorax. HEART AND MEDIASTINUM: Calcified aorta. BONES AND SOFT TISSUES: Stable L2 compression fracture. IMPRESSION: 1. No acute cardiopulmonary process. 2. Stable linear band of scarring in the right lower lobe. Electronically signed by: Greig Pique MD 10/01/2024 04:01 PM EST RP Workstation: [id number]    Scheduled Meds:  aspirin  EC  81 mg Oral Daily   vitamin D3  2,000 Units Oral Daily   enoxaparin  (LOVENOX ) injection  30 mg Subcutaneous QHS   gabapentin   300 mg Oral TID   metoprolol  succinate  12.5 mg Oral Daily   multivitamin with minerals  1 tablet Oral Daily   potassium chloride  SA  20 mEq Oral Daily   thiamine   100 mg Oral Daily   Continuous Infusions:  0.9 % NaCl with KCl 20 mEq / L 100 mL/hr at 10/02/24 0700   magnesium  sulfate bolus IVPB 2 g (10/02/24 1411)     LOS: 0 days    Time spent: 35 mins    Berlene Dixson Maree, MD [facility] Pager [phone]  If 7PM-7AM, please contact night-coverage  10/02/2024, 2:57 PM

## 2024-10-03 ENCOUNTER — Other Ambulatory Visit: Payer: Self-pay

## 2024-10-03 ENCOUNTER — Observation Stay (HOSPITAL_BASED_OUTPATIENT_CLINIC_OR_DEPARTMENT_OTHER)

## 2024-10-03 DIAGNOSIS — R072 Precordial pain: Secondary | ICD-10-CM

## 2024-10-03 DIAGNOSIS — R079 Chest pain, unspecified: Secondary | ICD-10-CM | POA: Diagnosis not present

## 2024-10-03 DIAGNOSIS — I4581 Long QT syndrome: Secondary | ICD-10-CM

## 2024-10-03 LAB — BASIC METABOLIC PANEL WITH GFR
Anion gap: 10 (ref 5–15)
BUN: 18 mg/dL (ref 8–23)
CO2: 22 mmol/L (ref 22–32)
Calcium: 9.3 mg/dL (ref 8.9–10.3)
Chloride: 106 mmol/L (ref 98–111)
Creatinine, Ser: 0.98 mg/dL (ref 0.44–1.00)
GFR, Estimated: >60 mL/min (ref >60)
Glucose, Bld: 91 mg/dL (ref 70–99)
Potassium: 4.3 mmol/L (ref 3.5–5.1)
Sodium: 139 mmol/L (ref 135–145)

## 2024-10-03 LAB — NM MYOCAR MULTI W/SPECT W/WALL MOTION / EF
LV dias vol: 80 mL (ref 46–106)
LV sys vol: 46 mL (ref 3.8–5.2)
MPHR: 159 {beats}/min
Peak HR: 89 {beats}/min
Percent HR: 55 %
Rest HR: 60 {beats}/min
Rest Nuclear Isotope Dose: 10.7 mCi
SDS: 0
SRS: 1
SSS: 1
ST Depression (mm): 0 mm
Stress Nuclear Isotope Dose: 31.6 mCi
TID: 1.04

## 2024-10-03 LAB — CBC
HCT: 31.8 % — ABNORMAL LOW (ref 36.0–46.0)
Hemoglobin: 10.1 g/dL — ABNORMAL LOW (ref 12.0–15.0)
MCH: 28.1 pg (ref 26.0–34.0)
MCHC: 31.8 g/dL (ref 30.0–36.0)
MCV: 88.6 fL (ref 80.0–100.0)
Platelets: 260 K/uL (ref 150–400)
RBC: 3.59 MIL/uL — ABNORMAL LOW (ref 3.87–5.11)
RDW: 14.6 % (ref 11.5–15.5)
WBC: 8.2 K/uL (ref 4.0–10.5)
nRBC: 0 % (ref 0.0–0.2)

## 2024-10-03 MED ORDER — TECHNETIUM TC 99M TETROFOSMIN IV KIT
10.7300 | PACK | Freq: Once | INTRAVENOUS | Status: AC | PRN
  Administered 2024-10-03: 12:00:00 10.73 via INTRAVENOUS

## 2024-10-03 MED ORDER — TECHNETIUM TC 99M TETROFOSMIN IV KIT
31.6400 | PACK | Freq: Once | INTRAVENOUS | Status: AC | PRN
  Administered 2024-10-03: 13:00:00 31.64 via INTRAVENOUS

## 2024-10-03 MED ORDER — REGADENOSON 0.4 MG/5ML IV SOLN
0.4000 mg | Freq: Once | INTRAVENOUS | Status: AC
  Administered 2024-10-03: 13:00:00 0.4 mg via INTRAVENOUS

## 2024-10-03 MED ORDER — VITAMIN D3 25 MCG PO TABS: 2000.0000 [IU] | ORAL_TABLET | Freq: Every day | ORAL | Status: AC

## 2024-10-03 NOTE — Plan of Care (Signed)

## 2024-10-03 NOTE — Consult Note (Signed)
 PHARMACY CONSULT NOTE - ELECTROLYTES  Pharmacy Consult for Electrolyte Monitoring and Replacement   Recent Labs: Potassium (mmol/L)  Date Value  10/03/2024 4.3   Magnesium  (mg/dL)  Date Value  87/85/7974 1.8   Calcium  (mg/dL)  Date Value  87/84/7974 9.3   Albumin  (g/dL)  Date Value  88/76/7974 3.4 (L)   Phosphorus (mg/dL)  Date Value  94/94/7974 3.2   Sodium (mmol/L)  Date Value  10/03/2024 139   Height: 5' 2 (157.5 cm) Weight: 42.6 kg (94 lb) IBW/kg (Calculated) : 50.1 Estimated Creatinine Clearance: 40.5 mL/min (by C-G formula based on SCr of 0.98 mg/dL).  Assessment  Carla Martinez is a 63 y.o. female presenting with chest pain. PMH significant for anxiety, depression, paroxysmal atrial fibrillation, diastolic CHF with most recent 2D echo on 08/05/2024 revealing EF of 85 to 60% with normal diastolic function, COPD, GERD, nonobstructive coronary artery disease, alcohol abuse . Pharmacy has been consulted to monitor and replace electrolytes.  Diet: NPO, sips w/ meds MIVF: NS + 20 mEq K/L @ 100 mL/hr - completed.  Pertinent medications: Kcl 20 mEq PO daily  Goal of Therapy: Electrolytes within normal limits  Plan:  No replacement needed at this time. Will stop scheduled potassium as pt is currently off lasix .  F/u with AM labs.   Thank you for allowing pharmacy to be a part of this patients care.  Cathaleen GORMAN Blanch, PharmD 10/03/2024 9:22 AM

## 2024-10-03 NOTE — Progress Notes (Signed)
 Rounding Note   Patient Name: Carla Martinez Date of Encounter: 10/03/2024   [facility] Cardiologist: Redell Cave, MD   Subjective She had an episode of chest pain described as tightness feeling last night that lasted for about 45 seconds.  She is chest pain-free today.  Scheduled Meds:  aspirin  EC  81 mg Oral Daily   atorvastatin   40 mg Oral Daily   vitamin D3  2,000 Units Oral Daily   enoxaparin  (LOVENOX ) injection  30 mg Subcutaneous QHS   gabapentin   300 mg Oral TID   metoprolol  succinate  12.5 mg Oral Daily   multivitamin with minerals  1 tablet Oral Daily   polyethylene glycol  17 g Oral Daily   thiamine   100 mg Oral Daily   Continuous Infusions:  PRN Meds: acetaminophen , ALPRAZolam , magnesium  hydroxide, melatonin, morphine  injection, nitroGLYCERIN , mouth rinse, oxyCODONE    Vital Signs  Vitals:   10/02/24 2352 10/03/24 0421 10/03/24 0500 10/03/24 0801  BP: (!) 105/54 119/62  (!) 105/59  Pulse: (!) 59 67  (!) 59  Resp: 10  13 16   Temp: 97.7 F (36.5 C) 97.7 F (36.5 C)  98.1 F (36.7 C)  TempSrc: Oral Oral    SpO2: 94% 95%  100%  Weight:      Height:        Intake/Output Summary (Last 24 hours) at 10/03/2024 0933 Last data filed at 10/03/2024 0300 Gross per 24 hour  Intake 1190.71 ml  Output 900 ml  Net 290.71 ml      10/01/2024    3:27 PM 09/13/2024    1:16 PM 09/12/2024    3:40 PM  Last 3 Weights  Weight (lbs) 94 lb    Weight (kg) 42.638 kg       Information is confidential and restricted. Go to Review Flowsheets to unlock data.      Telemetry SR - Personally Reviewed  ECG  Sinus rhythm with very prolonged QT interval- Personally Reviewed  Physical Exam  GEN: No acute distress.   Neck: No JVD Cardiac: RRR, no rubs, or gallops.  1 out of 6 systolic murmur at the left sternal border. Respiratory: Clear to auscultation bilaterally. GI: Soft, nontender, non-distended  MS: No edema; No deformity. Neuro:  Nonfocal   Psych: Normal affect   Labs High Sensitivity Troponin:  No results for input(s): TROPONINIHS in the last 720 hours.  Recent Labs  Lab 10/01/24 1529 10/01/24 1809 10/01/24 2016 10/01/24 2240  TRNPT 33* 31* 27* 23*       Chemistry Recent Labs  Lab 10/01/24 1529 10/01/24 2016 10/02/24 1204 10/03/24 0441  NA 137  --   --  139  K 3.4*  --  4.1 4.3  CL 100  --   --  106  CO2 21*  --   --  22  GLUCOSE 105*  --   --  91  BUN 36*  --   --  18  CREATININE 1.03* 0.96  --  0.98  CALCIUM  8.7*  --   --  9.3  MG 1.5*  --  1.8  --   GFRNONAA >60 >60  --  >60  ANIONGAP 16*  --   --  10    Lipids  Recent Labs  Lab 10/02/24 0337  CHOL 220*  TRIG 196*  HDL 41  LDLCALC 140*  CHOLHDL 5.4    Hematology Recent Labs  Lab 10/01/24 1529 10/01/24 2016 10/03/24 0441  WBC 8.6 10.0 8.2  RBC 4.11 3.82*  3.59*  HGB 11.7* 11.1* 10.1*  HCT 36.7 33.4* 31.8*  MCV 89.3 87.4 88.6  MCH 28.5 29.1 28.1  MCHC 31.9 33.2 31.8  RDW 14.5 14.5 14.6  PLT 328 288 260   Thyroid  No results for input(s): TSH, FREET4 in the last 168 hours.  BNP Recent Labs  Lab 10/01/24 2016  PROBNP 150.0    DDimer No results for input(s): DDIMER in the last 168 hours.   Radiology  ECHOCARDIOGRAM COMPLETE Result Date: 10/02/2024    ECHOCARDIOGRAM REPORT   Patient Name:   CHABELY NORBY Date of Exam: 10/02/2024 Medical Rec #:  [id number]            Height:       62.0 in Accession #:    [id number]           Weight:       94.0 lb Date of Birth:  March 28, 1961            BSA:          1.387 m Patient Age:    63 years             BP:           108/70 mmHg Patient Gender: F                    HR:           63 bpm. Exam Location:  [facility] Procedure: 2D Echo, Color Doppler, Cardiac Doppler, Strain Analysis and            Intracardiac Opacification Agent (Both Spectral and Color Flow            Doppler were utilized during procedure). Indications:     Chest Pain  History:         Patient has prior history of  Echocardiogram examinations, most                  recent 08/05/2024. CHF and Cardiomyopathy, CAD, Abnormal ECG,                  Signs/Symptoms:Hypotension, Shortness of Breath and Chest Pain;                  Risk Factors:Current Smoker. Altered Mental Status.  Sonographer:     Logan Shove RDCS Referring Phys:  [id number] RYAN M DUNN Diagnosing Phys: Shelda Bruckner MD IMPRESSIONS  1. Left ventricular ejection fraction, by estimation, is 55 to 60%. The left ventricle has normal function. The left ventricle has no regional wall motion abnormalities. Left ventricular diastolic parameters were normal.  2. Right ventricular systolic function is normal. The right ventricular size is normal.  3. Left atrial size was mild to moderately dilated.  4. A small pericardial effusion is present. Findings are consistent with cardiac tamponade.  5. 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 No evidence of mitral stenosis. 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  6. The aortic valve was not well visualized. There is mild calcification of the aortic valve. Aortic valve regurgitation is not visualized. No aortic stenosis is present.  7. The inferior vena cava is normal in size with greater than 50% respiratory variability, suggesting right atrial pressure of 3 mmHg. Comparison(s): No significant change from prior study. Prior images reviewed side by side. Conclusion(s)/Recommendation(s): Otherwise normal echocardiogram, with minor abnormalities described in the report. FINDINGS  Left Ventricle: Left ventricular ejection fraction, by  estimation, is 55 to 60%. The left ventricle has normal function. The left ventricle has no regional wall motion abnormalities. Definity  contrast agent was given IV to delineate the left ventricular  endocardial borders. Global longitudinal strain performed but not reported based on interpreter judgement due to suboptimal tracking. The left  ventricular internal cavity size was normal in size. There is no left ventricular hypertrophy. Left ventricular diastolic parameters were normal. Right Ventricle: The right ventricular size is normal. Right vetricular wall thickness was not well visualized. Right ventricular systolic function is normal. Left Atrium: Left atrial size was mild to moderately dilated. Right Atrium: Right atrial size was normal in size. Pericardium: A small pericardial effusion is present. There is evidence of cardiac tamponade. 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 Mild mitral valve regurgitation. No evidence of mitral valve stenosis. Tricuspid Valve: The tricuspid valve is not well visualized. Tricuspid valve regurgitation is trivial. No evidence of tricuspid stenosis. Aortic Valve: The aortic valve was not well visualized. There is mild calcification of the aortic valve. Aortic valve regurgitation is not visualized. No aortic stenosis is present. Aortic valve peak gradient measures 4.2 mmHg. Pulmonic Valve: The pulmonic valve was not well visualized. Pulmonic valve regurgitation is not visualized. No evidence of pulmonic stenosis. Aorta: The aortic root, ascending aorta, aortic arch and descending aorta are all structurally normal, with no evidence of dilitation or obstruction. Venous: The inferior vena cava is normal in size with greater than 50% respiratory variability, suggesting right atrial pressure of 3 mmHg. IAS/Shunts: The atrial septum is grossly normal.  LEFT VENTRICLE PLAX 2D LVIDd:         4.36 cm      Diastology LVIDs:         2.95 cm      LV e' medial:    6.31 cm/s LV PW:         1.00 cm      LV E/e' medial:  10.2 LV IVS:        0.90 cm      LV e' lateral:   7.51 cm/s LVOT diam:     2.10 cm      LV E/e' lateral: 8.6 LVOT Area:     3.46 cm LV IVRT:       102 msec  LV Volumes (MOD) LV vol d, MOD A2C: 139.0 ml LV vol d, MOD A4C: 117.0 ml LV  vol s, MOD A2C: 67.5 ml LV vol s, MOD A4C: 50.6 ml LV SV MOD A2C:     71.5 ml LV SV MOD A4C:     117.0 ml LV SV MOD BP:      70.2 ml RIGHT VENTRICLE             IVC RV Basal diam:  2.90 cm     IVC diam: 2.00 cm RV S prime:     10.30 cm/s TAPSE (M-mode): 2.4 cm LEFT ATRIUM             Index        RIGHT ATRIUM           Index LA diam:        2.70 cm 1.95 cm/m   RA Area:     15.50 cm LA Vol (A2C):   57.1 ml 41.17 ml/m  RA Volume:   36.30 ml  26.17 ml/m LA Vol (A4C):   53.1 ml 38.29 ml/m LA  Biplane Vol: 55.9 ml 40.30 ml/m  AORTIC VALVE AV Area (Vmax): 3.01 cm AV Vmax:        103.00 cm/s AV Peak Grad:   4.2 mmHg LVOT Vmax:      89.60 cm/s  AORTA Ao Root diam: 3.30 cm Ao Asc diam:  3.30 cm MITRAL VALVE MV Area (PHT): 2.73 cm    SHUNTS MV Decel Time: 278 msec    Systemic Diam: 2.10 cm MV E velocity: 64.50 cm/s MV A velocity: 67.10 cm/s MV E/A ratio:  0.96 Shelda Bruckner MD Electronically signed by Shelda Bruckner MD Signature Date/Time: 10/02/2024/3:09:24 PM    Final    DG Chest 2 View Result Date: 10/01/2024 EXAM: 2 VIEW(S) XRAY OF THE CHEST 10/01/2024 03:44:00 PM COMPARISON: Chest x-ray 06/21/2024. CT chest 07/01/2024. CLINICAL HISTORY: CP FINDINGS: LUNGS AND PLEURA: Linear band of scarring in the right lower lobe which is unchanged from prior CT. No focal pulmonary opacity. No pleural effusion. No pneumothorax. HEART AND MEDIASTINUM: Calcified aorta. BONES AND SOFT TISSUES: Stable L2 compression fracture. IMPRESSION: 1. No acute cardiopulmonary process. 2. Stable linear band of scarring in the right lower lobe. Electronically signed by: Greig Pique MD 10/01/2024 04:01 PM EST RP Workstation: [id number]    Cardiac Studies Echocardiogram was done yesterday and was personally reviewed by me.  It showed normal LV systolic function and wall motion.  Very small pericardial effusion with no tamponade.  Mild mitral regurgitation.  Patient Profile   63 y.o. female  with a hx of nonobstructive CAD  by LHC in 06/2021, stress-induced cardiomyopathy with subsequent normalization of LV systolic function by echo in 07/2024, prolonged QT, polysubstance abuse, SVT, and GERD who is being seen today for the evaluation of chest pain at the request of Dr. Lawence.   Assessment & Plan  1.  Atypical chest pain: Mildly elevated and flat troponin not consistent with acute coronary syndrome.  I reviewed previous cardiac catheterization which was done in 2022 and showed only mild nonobstructive coronary artery disease involving the right coronary artery and left circumflex.  Echocardiogram showed normal LV systolic function and wall motion.  I discussed different diagnostic testing with her.  Recommend proceeding with a Lexiscan  Myoview  today. Continue medical therapy for nonobstructive coronary artery disease.  2.  Prolonged QT: Severely prolonged QT interval on EKG.  Recommend avoiding any medication that can cause QT prolongation.  She was hypokalemic and hypomagnesemic and both were replaced.  I ordered a repeat EKG to be done today.    For questions or updates, please contact [facility] Please consult [email or web address] for contact info under       Signed, Deatrice Cage, MD  10/03/2024, 9:33 AM

## 2024-10-04 ENCOUNTER — Telehealth: Payer: Self-pay

## 2024-10-04 NOTE — Discharge Summary (Signed)
 Physician Discharge Summary   Patient: Carla Martinez MRN: [id number] DOB: 01/01/1961  Admit date:     10/01/2024  Discharge date: 10/03/2024  Discharge Physician: Cresencio Fairly   PCP: Wendee Lynwood HERO, NP   Recommendations at discharge:    F/up with outpt providers as requested  Discharge Diagnoses: Principal Problem:   Chest pain Active Problems:   Hypotension   Coronary artery disease   Anxiety and depression   History of alcohol abuse  Hospital Course: Assessment and Plan:  63 y.o. female with medical history significant for anxiety, depression, paroxysmal atrial fibrillation, diastolic CHF with most recent 2D echo on 08/05/2024 revealing EF of 85 to 60% with normal diastolic function, COPD, GERD, nonobstructive coronary artery disease, on alcohol abuse, who presented to the emergency room with acute onset of midsternal chest pain felt as a pressure and squeezing and graded 8/10 in severity with no nausea or vomiting or diaphoresis.    12/14: Cardiology consult 12/15: myoview  neg   1.  Atypical chest pain: Mildly elevated and flat troponin not consistent with acute coronary syndrome.   - Echocardiogram showed normal LV systolic function and wall motion.  Neg Lexiscan  Myoview   Continue medical therapy for nonobstructive coronary artery disease.   Prolonged QT measuring 653 ms Holding Seroquel , trazodone , hydroxyzine  and fluoxetine  while in the Hospital Repleted electrolytes aggressively, as she was hypokalemic and hypomagnesemic and both were replaced.   repeat EKG on the day of DC showed normal/improved QT    Hypotension - Low-dose Toprol  XL 12.5 mg daily .  Blood pressure is stable   Heart failure with improved ejection fraction Her lowest EF was 25 to 30% in 2022 followed by 40 to 45% in 2024 recent echo showed EF of 55 to 60% in October 2025   Nonobstructive coronary artery disease - Continue aspirin  81 mg daily, Toprol  XL 12.5 mg daily -I have added Lipitor 40  mg daily   History of alcohol abuse she was counseled for cessation on admission   Anxiety and depression - Held her psych medication considering QT prolongation while in the Hospital - as QT improved on the day of DC with repeat EKG, few psych meds are being resumed at DC         Consultants: Cardio  Disposition: Home Diet recommendation:  Carb modified diet DISCHARGE MEDICATION: Allergies as of 10/03/2024   No Known Allergies      Medication List     STOP taking these medications    hydrOXYzine  25 MG tablet Commonly known as: ATARAX    metoprolol  succinate 25 MG 24 hr tablet Commonly known as: TOPROL -XL   trazodone  300 MG tablet Commonly known as: DESYREL        TAKE these medications    FLUoxetine  20 MG capsule Commonly known as: PROZAC  TAKE 3 CAPSULES BY MOUTH ONCE DAILY What changed: Another medication with the same name was removed. Continue taking this medication, and follow the directions you see here.   furosemide  20 MG tablet Commonly known as: LASIX  Take 1 tablet (20 mg total) by mouth daily.   gabapentin  300 MG capsule Commonly known as: NEURONTIN  Take 1 capsule (300 mg total) by mouth 3 (three) times daily.   multivitamin with minerals Tabs tablet Take 1 tablet by mouth daily.   oxyCODONE  5 MG immediate release tablet Commonly known as: Roxicodone  Take 1 tablet (5 mg total) by mouth every 8 (eight) hours as needed.   pantoprazole  40 MG tablet Commonly known as: PROTONIX  Take 40  mg by mouth daily.   potassium chloride  SA 20 MEQ tablet Commonly known as: KLOR-CON  M Take 1 tablet (20 mEq total) by mouth daily.   QUEtiapine  100 MG tablet Commonly known as: SEROQUEL  Take 1 tablet by mouth twice daily What changed: Another medication with the same name was removed. Continue taking this medication, and follow the directions you see here.   thiamine  100 MG tablet Commonly known as: Vitamin B-1 Take 1 tablet (100 mg total) by mouth  daily.   vitamin D3 25 MCG tablet Commonly known as: CHOLECALCIFEROL  Take 2 tablets (2,000 Units total) by mouth daily.        Follow-up Information     Wendee Lynwood HERO, NP. Schedule an appointment as soon as possible for a visit in 3 day(s).   Specialties: Nurse Practitioner, Family Medicine Why: [facility] Discharge F/UP Contact information: [address] [place] [address] [phone]         Darliss Rogue, MD. Schedule an appointment as soon as possible for a visit in 2 week(s).   Specialties: Cardiology, Radiology Why: [facility] Discharge F/UP Contact information: [address] [place] [address] [phone]                Discharge Exam: Fredricka Weights   10/01/24 1527  Weight: 42.6 kg   General exam: Appears calm and comfortable  Respiratory system: Clear to auscultation. Respiratory effort normal. Cardiovascular system: S1 & S2 heard, 2/6 systolic ejection murmur present Gastrointestinal system: Abdomen is soft, benign Central nervous system: sleepy. No focal neurological deficits. Extremities: Symmetric 5 x 5 power. Skin: No rashes, lesions or ulcers  Condition at discharge: good  The results of significant diagnostics from this hospitalization (including imaging, microbiology, ancillary and laboratory) are listed below for reference.   Imaging Studies: NM Myocar Multi W/Spect W/Wall Motion / EF Result Date: 10/03/2024   The study is normal. The study is low risk.   No ST deviation was noted. The ECG was negative for ischemia.   LV perfusion is normal. There is no evidence of ischemia. There is no evidence of infarction.   Left ventricular function is normal. The left ventricular ejection fraction is normal (55-65%). End diastolic cavity size is normal. End systolic cavity size is normal.   Coronary calcium  was present on the attenuation correction CT images. Mild coronary calcifications were present.   ECHOCARDIOGRAM  COMPLETE Result Date: 10/02/2024    ECHOCARDIOGRAM REPORT   Patient Name:   TAYLOR SPILDE Date of Exam: 10/02/2024 Medical Rec #:  [id number]            Height:       62.0 in Accession #:    [id number]           Weight:       94.0 lb Date of Birth:  April 23, 1961            BSA:          1.387 m Patient Age:    63 years             BP:           108/70 mmHg Patient Gender: F                    HR:           63 bpm. Exam Location:  [facility] Procedure: 2D Echo, Color Doppler, Cardiac Doppler, Strain Analysis and  Intracardiac Opacification Agent (Both Spectral and Color Flow            Doppler were utilized during procedure). Indications:     Chest Pain  History:         Patient has prior history of Echocardiogram examinations, most                  recent 08/05/2024. CHF and Cardiomyopathy, CAD, Abnormal ECG,                  Signs/Symptoms:Hypotension, Shortness of Breath and Chest Pain;                  Risk Factors:Current Smoker. Altered Mental Status.  Sonographer:     Logan Shove RDCS Referring Phys:  [id number] RYAN M DUNN Diagnosing Phys: Shelda Bruckner MD IMPRESSIONS  1. Left ventricular ejection fraction, by estimation, is 55 to 60%. The left ventricle has normal function. The left ventricle has no regional wall motion abnormalities. Left ventricular diastolic parameters were normal.  2. Right ventricular systolic function is normal. The right ventricular size is normal.  3. Left atrial size was mild to moderately dilated.  4. A small pericardial effusion is present. Findings are consistent with cardiac tamponade.  5. 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 No evidence of mitral stenosis. 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  6. The aortic valve was not well visualized. There is mild calcification of the aortic valve. Aortic valve regurgitation is not visualized. No aortic stenosis is present.  7. The inferior vena cava is normal in  size with greater than 50% respiratory variability, suggesting right atrial pressure of 3 mmHg. Comparison(s): No significant change from prior study. Prior images reviewed side by side. Conclusion(s)/Recommendation(s): Otherwise normal echocardiogram, with minor abnormalities described in the report. FINDINGS  Left Ventricle: Left ventricular ejection fraction, by estimation, is 55 to 60%. The left ventricle has normal function. The left ventricle has no regional wall motion abnormalities. Definity  contrast agent was given IV to delineate the left ventricular  endocardial borders. Global longitudinal strain performed but not reported based on interpreter judgement due to suboptimal tracking. The left ventricular internal cavity size was normal in size. There is no left ventricular hypertrophy. Left ventricular diastolic parameters were normal. Right Ventricle: The right ventricular size is normal. Right vetricular wall thickness was not well visualized. Right ventricular systolic function is normal. Left Atrium: Left atrial size was mild to moderately dilated. Right Atrium: Right atrial size was normal in size. Pericardium: A small pericardial effusion is present. There is evidence of cardiac tamponade. 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 Mild mitral valve regurgitation. No evidence of mitral valve stenosis. Tricuspid Valve: The tricuspid valve is not well visualized. Tricuspid valve regurgitation is trivial. No evidence of tricuspid stenosis. Aortic Valve: The aortic valve was not well visualized. There is mild calcification of the aortic valve. Aortic valve regurgitation is not visualized. No aortic stenosis is present. Aortic valve peak gradient measures 4.2 mmHg. Pulmonic Valve: The pulmonic valve was not well visualized. Pulmonic valve regurgitation is not visualized. No evidence of pulmonic stenosis. Aorta: The aortic root,  ascending aorta, aortic arch and descending aorta are all structurally normal, with no evidence of dilitation or obstruction. Venous: The inferior vena cava is normal in size with greater than 50% respiratory variability, suggesting right atrial pressure of  3 mmHg. IAS/Shunts: The atrial septum is grossly normal.  LEFT VENTRICLE PLAX 2D LVIDd:         4.36 cm      Diastology LVIDs:         2.95 cm      LV e' medial:    6.31 cm/s LV PW:         1.00 cm      LV E/e' medial:  10.2 LV IVS:        0.90 cm      LV e' lateral:   7.51 cm/s LVOT diam:     2.10 cm      LV E/e' lateral: 8.6 LVOT Area:     3.46 cm LV IVRT:       102 msec  LV Volumes (MOD) LV vol d, MOD A2C: 139.0 ml LV vol d, MOD A4C: 117.0 ml LV vol s, MOD A2C: 67.5 ml LV vol s, MOD A4C: 50.6 ml LV SV MOD A2C:     71.5 ml LV SV MOD A4C:     117.0 ml LV SV MOD BP:      70.2 ml RIGHT VENTRICLE             IVC RV Basal diam:  2.90 cm     IVC diam: 2.00 cm RV S prime:     10.30 cm/s TAPSE (M-mode): 2.4 cm LEFT ATRIUM             Index        RIGHT ATRIUM           Index LA diam:        2.70 cm 1.95 cm/m   RA Area:     15.50 cm LA Vol (A2C):   57.1 ml 41.17 ml/m  RA Volume:   36.30 ml  26.17 ml/m LA Vol (A4C):   53.1 ml 38.29 ml/m LA Biplane Vol: 55.9 ml 40.30 ml/m  AORTIC VALVE AV Area (Vmax): 3.01 cm AV Vmax:        103.00 cm/s AV Peak Grad:   4.2 mmHg LVOT Vmax:      89.60 cm/s  AORTA Ao Root diam: 3.30 cm Ao Asc diam:  3.30 cm MITRAL VALVE MV Area (PHT): 2.73 cm    SHUNTS MV Decel Time: 278 msec    Systemic Diam: 2.10 cm MV E velocity: 64.50 cm/s MV A velocity: 67.10 cm/s MV E/A ratio:  0.96 Shelda Bruckner MD Electronically signed by Shelda Bruckner MD Signature Date/Time: 10/02/2024/3:09:24 PM    Final    DG Chest 2 View Result Date: 10/01/2024 EXAM: 2 VIEW(S) XRAY OF THE CHEST 10/01/2024 03:44:00 PM COMPARISON: Chest x-ray 06/21/2024. CT chest 07/01/2024. CLINICAL HISTORY: CP FINDINGS: LUNGS AND PLEURA: Linear band of scarring in the  right lower lobe which is unchanged from prior CT. No focal pulmonary opacity. No pleural effusion. No pneumothorax. HEART AND MEDIASTINUM: Calcified aorta. BONES AND SOFT TISSUES: Stable L2 compression fracture. IMPRESSION: 1. No acute cardiopulmonary process. 2. Stable linear band of scarring in the right lower lobe. Electronically signed by: Greig Pique MD 10/01/2024 04:01 PM EST RP Workstation: [id number]   DG Lumbar Spine 2-3 Views Result Date: 09/08/2024 EXAM: 2 or 3 VIEW(S) XRAY OF THE LUMBAR SPINE 09/05/2024 03:14:01 PM COMPARISON: 08/13/2024. CLINICAL HISTORY: Closed compression fracture of L1 vertebra. FINDINGS: LUMBAR SPINE: BONES: Compression fracture of L1 with estimated height loss of at least 50% anteriorly. Additional compression fracture of L2 with estimated height loss of at least  40% centrally. There is likely retropulsion at the superior endplates of both L1 and L2, although evaluation is limited on limited radiographic images. Vertebral body heights are otherwise maintained. Lumbar spine alignment is maintained. Hardware is present in the right femoral head. No aggressive appearing osseous lesion. DISCS AND DEGENERATIVE CHANGES: Mild disc space narrowing at L5-S1. SOFT TISSUES: Surgical clips in the right mid abdomen are partially visualized. IMPRESSION: 1. Compression fracture of L1 with estimated height loss of at least 50% anteriorly and likely retropulsion at the superior endplate. 2. Additional compression fracture of L2 with estimated height loss of at least 40% centrally and likely retropulsion at the superior endplate. 3. Findings overall similar to CT from 08/13/24. Electronically signed by: Donnice Mania MD 09/08/2024 09:09 PM EST RP Workstation: [id number]    Microbiology: Results for orders placed or performed in visit on 08/26/23  Fecal occult blood, imunochemical     Status: None   Collection Time: 08/26/23  1:44 PM   Specimen: Stool  Result Value Ref Range Status    Fecal Occult Bld Negative Negative Final    Labs: CBC: Recent Labs  Lab 10/01/24 1529 10/01/24 2016 10/03/24 0441  WBC 8.6 10.0 8.2  HGB 11.7* 11.1* 10.1*  HCT 36.7 33.4* 31.8*  MCV 89.3 87.4 88.6  PLT 328 288 260   Basic Metabolic Panel: Recent Labs  Lab 10/01/24 1529 10/01/24 2016 10/02/24 1204 10/03/24 0441  NA 137  --   --  139  K 3.4*  --  4.1 4.3  CL 100  --   --  106  CO2 21*  --   --  22  GLUCOSE 105*  --   --  91  BUN 36*  --   --  18  CREATININE 1.03* 0.96  --  0.98  CALCIUM  8.7*  --   --  9.3  MG 1.5*  --  1.8  --    Liver Function Tests: No results for input(s): AST, ALT, ALKPHOS, BILITOT, PROT, ALBUMIN  in the last 168 hours. CBG: No results for input(s): GLUCAP in the last 168 hours.  Discharge time spent: greater than 30 minutes.  Signed: Cresencio Fairly, MD [facility] 10/04/2024

## 2024-10-04 NOTE — Telephone Encounter (Signed)
 Noted, PCP not available, Okay to see.

## 2024-10-04 NOTE — Transitions of Care (Post Inpatient/ED Visit) (Signed)
 10/04/2024  Name: Carla Martinez MRN: [id number] DOB: 12-23-1960  Today's TOC FU Call Status: Today's TOC FU Call Status:: Successful TOC FU Call Completed TOC FU Call Complete Date: 10/04/24  Patient's Name and Date of Birth confirmed. Name, DOB  Transition Care Management Follow-up Telephone Call Date of Discharge: 10/03/24 Discharge Facility[facility]) Type of Discharge: Inpatient Admission Primary Inpatient Discharge Diagnosis:: Chest Pain How have you been since you were released from the hospital?: Better Any questions or concerns?: No  Items Reviewed: Did you receive and understand the discharge instructions provided?: Yes Medications obtained,verified, and reconciled?: Yes (Medications Reviewed) Any new allergies since your discharge?: No Dietary orders reviewed?: NA Do you have support at home?: Yes  Medications Reviewed Today: Medications Reviewed Today     Reviewed by Lavelle Charmaine NOVAK, LPN (Licensed Practical Nurse) on 10/04/24 at 1346  Med List Status: <None>   Medication Order Taking? Sig Documenting Provider Last Dose Status Informant  FLUoxetine  (PROZAC ) 20 MG capsule [id number] No TAKE 3 CAPSULES BY MOUTH ONCE DAILY Nwoko, Uchenna E, PA Past Week Active   furosemide  (LASIX ) 20 MG tablet [id number] No Take 1 tablet (20 mg total) by mouth daily. Darliss Rogue, MD Past Week Active Pharmacy Records, Self  gabapentin  (NEURONTIN ) 300 MG capsule [id number] No Take 1 capsule (300 mg total) by mouth 3 (three) times daily. Nwoko, Uchenna E, PA Past Week Active Self, Pharmacy Records           Med Note LEOBARDO, NICOLE   Sat Aug 20, 2024 10:42 PM) Verified with medication bottle.   Multiple Vitamin (MULTIVITAMIN WITH MINERALS) TABS tablet [id number] No Take 1 tablet by mouth daily. Alger Ding, MD Past Week Active   oxyCODONE  (ROXICODONE ) 5 MG immediate release tablet [id number] No Take 1 tablet (5 mg total) by mouth every 8 (eight) hours  as needed. Ulis Bottcher, PA-C Unknown Active Self, Pharmacy Records  pantoprazole  (PROTONIX ) 40 MG tablet [id number] No Take 40 mg by mouth daily. [provider] Past Week Active   potassium chloride  SA (KLOR-CON  M) 20 MEQ tablet [id number] No Take 1 tablet (20 mEq total) by mouth daily. Darliss Rogue, MD Past Week Active Self, Pharmacy Records  QUEtiapine  (SEROQUEL ) 100 MG tablet [id number] No Take 1 tablet by mouth twice daily Nwoko, Uchenna E, PA Past Week Active   thiamine  (VITAMIN B-1) 100 MG tablet [id number] No Take 1 tablet (100 mg total) by mouth daily. Alger Ding, MD Past Week Active   vitamin D3 (CHOLECALCIFEROL ) 25 MCG tablet [id number]  Take 2 tablets (2,000 Units total) by mouth daily. Maree Hue, MD  Active             Home Care and Equipment/Supplies: Were Home Health Services Ordered?: NA Any new equipment or medical supplies ordered?: NA  Functional Questionnaire: Do you need assistance with bathing/showering or dressing?: No Do you need assistance with meal preparation?: No Do you need assistance with eating?: No Do you have difficulty maintaining continence: No Do you need assistance with getting out of bed/getting out of a chair/moving?: No Do you have difficulty managing or taking your medications?: No  Follow up appointments reviewed: PCP Follow-up appointment confirmed?: Yes Date of PCP follow-up appointment?: 10/07/24 Follow-up Provider: Dr. Avelina Specialist [facility] Follow-up appointment confirmed?: No (cardiology) Reason Specialist Follow-Up Not Confirmed: Patient has Specialist Provider Number and will Call for Appointment Do you need transportation to your follow-up appointment?: No Do you understand care options if your condition(s) worsen?: Yes-patient  verbalized understanding    SIGNATURE Charmaine Bloodgood, LPN [facility] Health Advisor [place] [facility] You Are. We Are. [facility] Direct Dial [phone]

## 2024-10-05 ENCOUNTER — Ambulatory Visit

## 2024-10-05 NOTE — Telephone Encounter (Signed)
 noted

## 2024-10-07 ENCOUNTER — Encounter: Payer: Self-pay | Admitting: Family Medicine

## 2024-10-07 ENCOUNTER — Inpatient Hospital Stay: Admitting: Family Medicine

## 2024-10-07 VITALS — BP 90/60 | HR 97 | Temp 98.6°F | Ht 63.0 in | Wt 95.3 lb

## 2024-10-07 DIAGNOSIS — R0789 Other chest pain: Secondary | ICD-10-CM

## 2024-10-07 DIAGNOSIS — F411 Generalized anxiety disorder: Secondary | ICD-10-CM

## 2024-10-07 DIAGNOSIS — R002 Palpitations: Secondary | ICD-10-CM

## 2024-10-07 DIAGNOSIS — S32020G Wedge compression fracture of second lumbar vertebra, subsequent encounter for fracture with delayed healing: Secondary | ICD-10-CM

## 2024-10-07 DIAGNOSIS — R9431 Abnormal electrocardiogram [ECG] [EKG]: Secondary | ICD-10-CM

## 2024-10-07 DIAGNOSIS — E876 Hypokalemia: Secondary | ICD-10-CM

## 2024-10-07 DIAGNOSIS — I9589 Other hypotension: Secondary | ICD-10-CM

## 2024-10-07 DIAGNOSIS — E119 Type 2 diabetes mellitus without complications: Secondary | ICD-10-CM | POA: Insufficient documentation

## 2024-10-07 MED ORDER — HYDROXYZINE PAMOATE 50 MG PO CAPS: 50.0000 mg | ORAL_CAPSULE | Freq: Three times a day (TID) | ORAL | 0 refills | Status: DC | PRN

## 2024-10-07 MED ORDER — METOPROLOL TARTRATE 25 MG PO TABS: 12.5000 mg | ORAL_TABLET | Freq: Two times a day (BID) | ORAL | 0 refills | Status: DC | PRN

## 2024-10-07 NOTE — Assessment & Plan Note (Signed)
 Most likely secondary to hypokalemia and hypomagnesia ... Recommended repeat check today given patient is not eating and drinking well.  Patient refused blood work.  EKG in office today showed normal sinus rhythm no QT changes, no LVH.

## 2024-10-07 NOTE — Assessment & Plan Note (Signed)
 Due for reevaluation, patient refuses lab check.

## 2024-10-07 NOTE — Assessment & Plan Note (Signed)
 Patient reports poor intake although no emesis.  I encouraged her to try to increase water or electrolyte drink in take significantly to avoid dehydration.

## 2024-10-07 NOTE — Assessment & Plan Note (Addendum)
 Chronic with acute worsening. Likely symptoms are worse given she has been taken off her metoprolol  as well as the hydroxyzine . We discussed possible increase in gabapentin  given this has no effect on QT interval but she would prefer to restart the hydroxyzine  but try a lower dose. Recommended follow-up with psychiatry.  Patient will call to schedule as soon as possible.

## 2024-10-07 NOTE — Assessment & Plan Note (Signed)
 Acute, now resolved. Encouraged patient to to call to schedule follow-up with her cardiologist earlier than 1 year check.  Return and ER precautions provided.

## 2024-10-07 NOTE — Progress Notes (Signed)
 "   Patient ID: Carla Martinez, female    DOB: July 11, 1961, 63 y.o.   MRN: [id number]  This visit was conducted in person.  BP 90/60   Pulse 97   Temp 98.6 F (37 C) (Temporal)   Ht 5' 3 (1.6 m)   Wt 95 lb 5 oz (43.2 kg) Comment: Patient reported  SpO2 99%   BMI 16.88 kg/m    CC:  Chief Complaint  Patient presents with   Hospitalization Follow-up    Subjective:   HPI: Carla Martinez is a 63 y.o. female presenting on 10/07/2024 for Hospitalization Follow-up   PCP [facility] (out of town) pt unknown to me Hospital admission  10/01/2024 Has history of paroxysmal atrial fibrillation, diastolic congestive heart failure with most recent 2D echo on August 05, 2024 with EF 55 to 60% COPD, nonobstructive coronary artery disease Presented to ED with acute onset midsternal chest pain  Cardiology consulted.  Patient found to have mildly elevated and flat troponin not consistent with acute coronary syndrome. Negative Lexiscan  Myoview  Noted to have prolonged QT: Recommended held Seroquel , trazodone , hydroxyzine  and fluoxetine .  Psych meds resumed at discharge. She had electrolytes.  Did as she was both hypokalemic and hypomagnesia.  Repeat EKG on day of discharge showed normal improved QT.    Today she reports  heart is pounding hard, she is feeling very anxious.  NO further chest pain. Mild SOB.  No swelling ankles. She has not been taking hydroxyzine  because she was not sure she was supposed to.. 2 tabs three times daily. Has been taking prozac , seroquel  and trazodone .. Psychiatrist.. no scheduled visits.    BP is normally 90/60.[name] she has not been taking metoprolol .. was told not to at hospital.  Cardiology  Dr. Budd Kindle... reviewed 08/2024   She is not eating and drinking well. Having nausea. Drinking decaf coffee and coke zero ( 1-2 a day). No water    Has severe pain from compression fracture L2.. on gabapentin    Relevant past medical, surgical, family and social  history reviewed and updated as indicated. Interim medical history since our last visit reviewed. Allergies and medications reviewed and updated. Outpatient Medications Prior to Visit  Medication Sig Dispense Refill   FLUoxetine  (PROZAC ) 20 MG capsule TAKE 3 CAPSULES BY MOUTH ONCE DAILY 90 capsule 0   furosemide  (LASIX ) 20 MG tablet Take 1 tablet (20 mg total) by mouth daily. 180 tablet 3   gabapentin  (NEURONTIN ) 300 MG capsule Take 1 capsule (300 mg total) by mouth 3 (three) times daily. 90 capsule 1   Multiple Vitamin (MULTIVITAMIN WITH MINERALS) TABS tablet Take 1 tablet by mouth daily. 90 tablet 0   oxyCODONE  (ROXICODONE ) 5 MG immediate release tablet Take 1 tablet (5 mg total) by mouth every 8 (eight) hours as needed. 20 tablet 0   pantoprazole  (PROTONIX ) 40 MG tablet Take 40 mg by mouth daily.     potassium chloride  SA (KLOR-CON  M) 20 MEQ tablet Take 1 tablet (20 mEq total) by mouth daily. 180 tablet 3   QUEtiapine  (SEROQUEL ) 100 MG tablet Take 1 tablet by mouth twice daily 60 tablet 0   thiamine  (VITAMIN B-1) 100 MG tablet Take 1 tablet (100 mg total) by mouth daily. 30 tablet 0   vitamin D3 (CHOLECALCIFEROL ) 25 MCG tablet Take 2 tablets (2,000 Units total) by mouth daily.     No facility-administered medications prior to visit.     Per HPI unless specifically indicated in ROS section below Review of Systems  Constitutional:  Negative for fatigue and fever.  HENT:  Negative for congestion.   Eyes:  Negative for pain.  Respiratory:  Negative for cough and shortness of breath.   Cardiovascular:  Positive for palpitations. Negative for chest pain and leg swelling.  Gastrointestinal:  Negative for abdominal pain.  Genitourinary:  Negative for dysuria and vaginal bleeding.  Musculoskeletal:  Positive for back pain.  Neurological:  Negative for syncope, light-headedness and headaches.  Psychiatric/Behavioral:  Negative for dysphoric mood.    Objective:  BP 90/60   Pulse 97   Temp  98.6 F (37 C) (Temporal)   Ht 5' 3 (1.6 m)   Wt 95 lb 5 oz (43.2 kg) Comment: Patient reported  SpO2 99%   BMI 16.88 kg/m   Wt Readings from Last 3 Encounters:  10/07/24 95 lb 5 oz (43.2 kg)  10/01/24 94 lb (42.6 kg)  09/05/24 95 lb 3.2 oz (43.2 kg)      Physical Exam Constitutional:      General: She is not in acute distress.    Appearance: Normal appearance. She is well-developed. She is not ill-appearing or toxic-appearing.  HENT:     Head: Normocephalic.     Right Ear: Hearing, tympanic membrane, ear canal and external ear normal. Tympanic membrane is not erythematous, retracted or bulging.     Left Ear: Hearing, tympanic membrane, ear canal and external ear normal. Tympanic membrane is not erythematous, retracted or bulging.     Nose: No mucosal edema or rhinorrhea.     Right Sinus: No maxillary sinus tenderness or frontal sinus tenderness.     Left Sinus: No maxillary sinus tenderness or frontal sinus tenderness.     Mouth/Throat:     Pharynx: Uvula midline.  Eyes:     General: Lids are normal. Lids are everted, no foreign bodies appreciated.     Conjunctiva/sclera: Conjunctivae normal.     Pupils: Pupils are equal, round, and reactive to light.  Neck:     Thyroid : No thyroid  mass or thyromegaly.     Vascular: No carotid bruit.     Trachea: Trachea normal.  Cardiovascular:     Rate and Rhythm: Normal rate and regular rhythm.     Pulses: Normal pulses.     Heart sounds: Normal heart sounds, S1 normal and S2 normal. No murmur heard.    No friction rub. No gallop.  Pulmonary:     Effort: Pulmonary effort is normal. No tachypnea or respiratory distress.     Breath sounds: Normal breath sounds. No decreased breath sounds, wheezing, rhonchi or rales.  Abdominal:     General: Bowel sounds are normal.     Palpations: Abdomen is soft.     Tenderness: There is no abdominal tenderness.  Musculoskeletal:     Cervical back: Normal range of motion and neck supple.  Skin:     General: Skin is warm and dry.     Findings: No rash.  Neurological:     Mental Status: She is alert.  Psychiatric:        Mood and Affect: Mood is not anxious or depressed.        Speech: Speech normal.        Behavior: Behavior normal. Behavior is cooperative.        Thought Content: Thought content normal.        Judgment: Judgment normal.       Results for orders placed or performed during the hospital encounter of 10/01/24  Basic metabolic  panel   Collection Time: 10/01/24  3:29 PM  Result Value Ref Range   Sodium 137 135 - 145 mmol/L   Potassium 3.4 (L) 3.5 - 5.1 mmol/L   Chloride 100 98 - 111 mmol/L   CO2 21 (L) 22 - 32 mmol/L   Glucose, Bld 105 (H) 70 - 99 mg/dL   BUN 36 (H) 8 - 23 mg/dL   Creatinine, Ser 8.96 (H) 0.44 - 1.00 mg/dL   Calcium  8.7 (L) 8.9 - 10.3 mg/dL   GFR, Estimated >39 >39 mL/min   Anion gap 16 (H) 5 - 15  CBC   Collection Time: 10/01/24  3:29 PM  Result Value Ref Range   WBC 8.6 4.0 - 10.5 K/uL   RBC 4.11 3.87 - 5.11 MIL/uL   Hemoglobin 11.7 (L) 12.0 - 15.0 g/dL   HCT 63.2 63.9 - 53.9 %   MCV 89.3 80.0 - 100.0 fL   MCH 28.5 26.0 - 34.0 pg   MCHC 31.9 30.0 - 36.0 g/dL   RDW 85.4 88.4 - 84.4 %   Platelets 328 150 - 400 K/uL   nRBC 0.0 0.0 - 0.2 %  Magnesium    Collection Time: 10/01/24  3:29 PM  Result Value Ref Range   Magnesium  1.5 (L) 1.7 - 2.4 mg/dL  Troponin T, High Sensitivity   Collection Time: 10/01/24  3:29 PM  Result Value Ref Range   Troponin T High Sensitivity 33 (H) 0 - 19 ng/L  Troponin T, High Sensitivity   Collection Time: 10/01/24  6:09 PM  Result Value Ref Range   Troponin T High Sensitivity 31 (H) 0 - 19 ng/L  Pro Brain natriuretic peptide   Collection Time: 10/01/24  8:16 PM  Result Value Ref Range   Pro Brain Natriuretic Peptide 150.0 <300.0 pg/mL  CBC   Collection Time: 10/01/24  8:16 PM  Result Value Ref Range   WBC 10.0 4.0 - 10.5 K/uL   RBC 3.82 (L) 3.87 - 5.11 MIL/uL   Hemoglobin 11.1 (L) 12.0 - 15.0 g/dL    HCT 66.5 (L) 63.9 - 46.0 %   MCV 87.4 80.0 - 100.0 fL   MCH 29.1 26.0 - 34.0 pg   MCHC 33.2 30.0 - 36.0 g/dL   RDW 85.4 88.4 - 84.4 %   Platelets 288 150 - 400 K/uL   nRBC 0.0 0.0 - 0.2 %  Creatinine, serum   Collection Time: 10/01/24  8:16 PM  Result Value Ref Range   Creatinine, Ser 0.96 0.44 - 1.00 mg/dL   GFR, Estimated >39 >39 mL/min  Troponin T, High Sensitivity   Collection Time: 10/01/24  8:16 PM  Result Value Ref Range   Troponin T High Sensitivity 27 (H) 0 - 19 ng/L  Troponin T, High Sensitivity   Collection Time: 10/01/24 10:40 PM  Result Value Ref Range   Troponin T High Sensitivity 23 (H) 0 - 19 ng/L  Lipid panel   Collection Time: 10/02/24  3:37 AM  Result Value Ref Range   Cholesterol 220 (H) 0 - 200 mg/dL   Triglycerides 803 (H) <150 mg/dL   HDL 41 >59 mg/dL   Total CHOL/HDL Ratio 5.4 RATIO   VLDL 39 0 - 40 mg/dL   LDL Cholesterol 859 (H) 0 - 99 mg/dL  Magnesium    Collection Time: 10/02/24 12:04 PM  Result Value Ref Range   Magnesium  1.8 1.7 - 2.4 mg/dL  Potassium   Collection Time: 10/02/24 12:04 PM  Result Value Ref Range  Potassium 4.1 3.5 - 5.1 mmol/L  ECHOCARDIOGRAM COMPLETE   Collection Time: 10/02/24  2:19 PM  Result Value Ref Range   Weight 1,504 oz   Height 62 in   BP 98/65 mmHg   Ao pk vel 1.03 m/s   AR max vel 3.01 cm2   AV Peak grad 4.2 mmHg   Single Plane A2C EF 51.4 %   Single Plane A4C EF 56.8 %   Calc EF 54.4 %   S' Lateral 2.95 cm   Area-P 1/2 2.73 cm2   Est EF 55 - 60%   Basic metabolic panel with GFR   Collection Time: 10/03/24  4:41 AM  Result Value Ref Range   Sodium 139 135 - 145 mmol/L   Potassium 4.3 3.5 - 5.1 mmol/L   Chloride 106 98 - 111 mmol/L   CO2 22 22 - 32 mmol/L   Glucose, Bld 91 70 - 99 mg/dL   BUN 18 8 - 23 mg/dL   Creatinine, Ser 9.01 0.44 - 1.00 mg/dL   Calcium  9.3 8.9 - 10.3 mg/dL   GFR, Estimated >39 >39 mL/min   Anion gap 10 5 - 15  CBC   Collection Time: 10/03/24  4:41 AM  Result Value Ref  Range   WBC 8.2 4.0 - 10.5 K/uL   RBC 3.59 (L) 3.87 - 5.11 MIL/uL   Hemoglobin 10.1 (L) 12.0 - 15.0 g/dL   HCT 68.1 (L) 63.9 - 53.9 %   MCV 88.6 80.0 - 100.0 fL   MCH 28.1 26.0 - 34.0 pg   MCHC 31.8 30.0 - 36.0 g/dL   RDW 85.3 88.4 - 84.4 %   Platelets 260 150 - 400 K/uL   nRBC 0.0 0.0 - 0.2 %  NM Myocar Multi W/Spect W/Wall Motion / EF   Collection Time: 10/03/24  2:38 PM  Result Value Ref Range   Rest Nuclear Isotope Dose 10.7 mCi   Stress Nuclear Isotope Dose 31.6 mCi   SSS 1.0    SRS 1.0    SDS 0.0    TID 1.04    LV sys vol 46.0 3.8 - 5.2 mL   LV dias vol 80.0 46 - 106 mL   Rest HR 60.0 bpm   Rest BP 114/63 mmHg   Peak HR 89 bpm   Peak BP 117/43 mmHg   MPHR 159 bpm   Percent HR 55.0 %   ST Depression (mm) 0 mm    Assessment and Plan  Pounding heartbeat Assessment & Plan: Acute, she mainly noted this after hospitalization when she was taken off the metoprolol  and the hydroxyzine .  This may be secondary to being off the beta-blocker or poorly controlled anxiety. She will restart hydroxyzine  at a lower dose.  She will follow-up with her psychiatrist to better manage anxiety.  I have provided her with metoprolol  to use as needed twice daily if heart racing.  I believe this will help with her symptom without lowering her blood pressure too much.  Return and ER precautions provided.   Atypical chest pain Assessment & Plan: Acute, now resolved. Encouraged patient to to call to schedule follow-up with her cardiologist earlier than 1 year check.  Return and ER precautions provided.   Prolonged Q-T interval on ECG Assessment & Plan: Most likely secondary to hypokalemia and hypomagnesia ... Recommended repeat check today given patient is not eating and drinking well.  Patient refused blood work.  EKG in office today showed normal sinus rhythm no QT changes, no  LVH.    Orders: -     EKG 12-Lead  Hypokalemia Assessment & Plan: Patient refuses lab recheck.   Encouraged patient to take in fluids with electrolytes.  She is currently taking a potassium tablet 20 mg daily.   Type 2 diabetes mellitus treated with insulin (HCC)  Hypomagnesemia Assessment & Plan: Due for reevaluation, patient refuses lab check.   Hypotension, chronic Assessment & Plan: Patient reports poor intake although no emesis.  I encouraged her to try to increase water or electrolyte drink in take significantly to avoid dehydration.    Generalized anxiety disorder Assessment & Plan: Chronic with acute worsening. Likely symptoms are worse given she has been taken off her metoprolol  as well as the hydroxyzine . We discussed possible increase in gabapentin  given this has no effect on QT interval but she would prefer to restart the hydroxyzine  but try a lower dose. Recommended follow-up with psychiatry.  Patient will call to schedule as soon as possible.   Compression fracture of L2 vertebra with delayed healing, subsequent encounter  Other orders -     hydrOXYzine  Pamoate; Take 1 capsule (50 mg total) by mouth 3 (three) times daily as needed.  Dispense: 90 capsule; Refill: 0 -     Metoprolol  Tartrate; Take 0.5 tablets (12.5 mg total) by mouth 2 (two) times daily as needed.  Dispense: 30 tablet; Refill: 0    No follow-ups on file.   Greig Ring, MD  "

## 2024-10-07 NOTE — Assessment & Plan Note (Signed)
 Acute, she mainly noted this after hospitalization when she was taken off the metoprolol  and the hydroxyzine .  This may be secondary to being off the beta-blocker or poorly controlled anxiety. She will restart hydroxyzine  at a lower dose.  She will follow-up with her psychiatrist to better manage anxiety.  I have provided her with metoprolol  to use as needed twice daily if heart racing.  I believe this will help with her symptom without lowering her blood pressure too much.  Return and ER precautions provided.

## 2024-10-07 NOTE — Assessment & Plan Note (Signed)
 Patient refuses lab recheck.  Encouraged patient to take in fluids with electrolytes.  She is currently taking a potassium tablet 20 mg daily.

## 2024-10-10 ENCOUNTER — Other Ambulatory Visit: Payer: Self-pay | Admitting: Nurse Practitioner

## 2024-10-10 DIAGNOSIS — F1090 Alcohol use, unspecified, uncomplicated: Secondary | ICD-10-CM

## 2024-10-10 NOTE — Telephone Encounter (Signed)
 Copied from CRM #[id number]. Topic: Clinical - Medication Refill >> Oct 10, 2024  1:49 PM Macario HERO wrote: Medication: gabapentin  (NEURONTIN ) 300 MG capsule [[id number]] Multiple Vitamin (MULTIVITAMIN WITH MINERALS) TABS tablet [[id number]] oxyCODONE  (ROXICODONE ) 5 MG immediate release tablet [[id number]]  Has the patient contacted their pharmacy? No (Agent: If no, request that the patient contact the pharmacy for the refill. If patient does not wish to contact the pharmacy document the reason why and proceed with request.) (Agent: If yes, when and what did the pharmacy advise?)  This is the patient's preferred pharmacy:  [facility] [address] [place], [place] - [address] [place] [address] Phone: [phone] Fax: [phone]  Is this the correct pharmacy for this prescription? Yes If no, delete pharmacy and type the correct one.   Has the prescription been filled recently? Yes  Is the patient out of the medication? Yes  Has the patient been seen for an appointment in the last year OR does the patient have an upcoming appointment? Yes  Can we respond through MyChart? Yes  Agent: Please be advised that Rx refills may take up to 3 business days. We ask that you follow-up with your pharmacy.

## 2024-10-11 ENCOUNTER — Other Ambulatory Visit (HOSPITAL_COMMUNITY): Payer: Self-pay | Admitting: Physician Assistant

## 2024-10-11 DIAGNOSIS — F411 Generalized anxiety disorder: Secondary | ICD-10-CM

## 2024-10-11 DIAGNOSIS — F339 Major depressive disorder, recurrent, unspecified: Secondary | ICD-10-CM

## 2024-10-18 ENCOUNTER — Ambulatory Visit (INDEPENDENT_AMBULATORY_CARE_PROVIDER_SITE_OTHER): Admitting: Licensed Clinical Social Worker

## 2024-10-18 DIAGNOSIS — F411 Generalized anxiety disorder: Secondary | ICD-10-CM

## 2024-10-18 DIAGNOSIS — F1020 Alcohol dependence, uncomplicated: Secondary | ICD-10-CM | POA: Diagnosis not present

## 2024-10-18 DIAGNOSIS — Z634 Disappearance and death of family member: Secondary | ICD-10-CM

## 2024-10-18 DIAGNOSIS — F322 Major depressive disorder, single episode, severe without psychotic features: Secondary | ICD-10-CM
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
## 2024-10-25 ENCOUNTER — Other Ambulatory Visit (HOSPITAL_COMMUNITY): Payer: Self-pay | Admitting: Physician Assistant

## 2024-10-25 DIAGNOSIS — F411 Generalized anxiety disorder: Secondary | ICD-10-CM

## 2024-10-25 DIAGNOSIS — G4700 Insomnia, unspecified: Secondary | ICD-10-CM

## 2024-10-25 DIAGNOSIS — F339 Major depressive disorder, recurrent, unspecified: Secondary | ICD-10-CM

## 2024-10-27 ENCOUNTER — Ambulatory Visit (INDEPENDENT_AMBULATORY_CARE_PROVIDER_SITE_OTHER): Admitting: Licensed Clinical Social Worker

## 2024-10-27 DIAGNOSIS — F411 Generalized anxiety disorder: Secondary | ICD-10-CM | POA: Diagnosis not present

## 2024-10-27 DIAGNOSIS — F1020 Alcohol dependence, uncomplicated: Secondary | ICD-10-CM

## 2024-10-27 DIAGNOSIS — F339 Major depressive disorder, recurrent, unspecified: Secondary | ICD-10-CM

## 2024-10-27 DIAGNOSIS — Z634 Disappearance and death of family member: Secondary | ICD-10-CM
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
## 2024-10-30 ENCOUNTER — Observation Stay: Admission: EM | Admit: 2024-10-30 | Discharge: 2024-10-31 | Disposition: A

## 2024-10-30 ENCOUNTER — Emergency Department

## 2024-10-30 ENCOUNTER — Other Ambulatory Visit: Payer: Self-pay

## 2024-10-30 DIAGNOSIS — R531 Weakness: Principal | ICD-10-CM | POA: Insufficient documentation

## 2024-10-30 DIAGNOSIS — I480 Paroxysmal atrial fibrillation: Secondary | ICD-10-CM | POA: Diagnosis not present

## 2024-10-30 DIAGNOSIS — J449 Chronic obstructive pulmonary disease, unspecified: Secondary | ICD-10-CM | POA: Diagnosis present

## 2024-10-30 DIAGNOSIS — R0789 Other chest pain: Secondary | ICD-10-CM | POA: Diagnosis present

## 2024-10-30 DIAGNOSIS — E43 Unspecified severe protein-calorie malnutrition: Secondary | ICD-10-CM | POA: Diagnosis not present

## 2024-10-30 DIAGNOSIS — N179 Acute kidney failure, unspecified: Secondary | ICD-10-CM | POA: Diagnosis present

## 2024-10-30 DIAGNOSIS — J42 Unspecified chronic bronchitis: Secondary | ICD-10-CM

## 2024-10-30 DIAGNOSIS — F418 Other specified anxiety disorders: Secondary | ICD-10-CM | POA: Insufficient documentation

## 2024-10-30 DIAGNOSIS — I5032 Chronic diastolic (congestive) heart failure: Secondary | ICD-10-CM | POA: Diagnosis present

## 2024-10-30 DIAGNOSIS — R253 Fasciculation: Secondary | ICD-10-CM | POA: Diagnosis not present

## 2024-10-30 DIAGNOSIS — R262 Difficulty in walking, not elsewhere classified: Secondary | ICD-10-CM | POA: Insufficient documentation

## 2024-10-30 DIAGNOSIS — F17210 Nicotine dependence, cigarettes, uncomplicated: Secondary | ICD-10-CM | POA: Insufficient documentation

## 2024-10-30 DIAGNOSIS — F32A Depression, unspecified: Secondary | ICD-10-CM | POA: Diagnosis present

## 2024-10-30 DIAGNOSIS — R079 Chest pain, unspecified: Secondary | ICD-10-CM | POA: Diagnosis not present

## 2024-10-30 DIAGNOSIS — R9431 Abnormal electrocardiogram [ECG] [EKG]: Secondary | ICD-10-CM | POA: Diagnosis present

## 2024-10-30 DIAGNOSIS — I2510 Atherosclerotic heart disease of native coronary artery without angina pectoris: Secondary | ICD-10-CM | POA: Diagnosis not present

## 2024-10-30 DIAGNOSIS — Y9289 Other specified places as the place of occurrence of the external cause: Secondary | ICD-10-CM | POA: Insufficient documentation

## 2024-10-30 DIAGNOSIS — Z720 Tobacco use: Secondary | ICD-10-CM | POA: Diagnosis present

## 2024-10-30 DIAGNOSIS — F419 Anxiety disorder, unspecified: Secondary | ICD-10-CM | POA: Diagnosis present

## 2024-10-30 DIAGNOSIS — Y92009 Unspecified place in unspecified non-institutional (private) residence as the place of occurrence of the external cause: Secondary | ICD-10-CM

## 2024-10-30 DIAGNOSIS — I10 Essential (primary) hypertension: Secondary | ICD-10-CM | POA: Diagnosis not present

## 2024-10-30 DIAGNOSIS — F1011 Alcohol abuse, in remission: Secondary | ICD-10-CM | POA: Diagnosis not present

## 2024-10-30 DIAGNOSIS — Z79899 Other long term (current) drug therapy: Secondary | ICD-10-CM | POA: Diagnosis not present

## 2024-10-30 DIAGNOSIS — W19XXXA Unspecified fall, initial encounter: Secondary | ICD-10-CM | POA: Insufficient documentation

## 2024-10-30 DIAGNOSIS — R259 Unspecified abnormal involuntary movements: Secondary | ICD-10-CM | POA: Insufficient documentation

## 2024-10-30 DIAGNOSIS — I110 Hypertensive heart disease with heart failure: Secondary | ICD-10-CM | POA: Diagnosis not present

## 2024-10-30 DIAGNOSIS — R252 Cramp and spasm: Secondary | ICD-10-CM

## 2024-10-30 LAB — URINALYSIS, COMPLETE (UACMP) WITH MICROSCOPIC
Bacteria, UA: NONE SEEN
Bilirubin Urine: NEGATIVE
Glucose, UA: NEGATIVE mg/dL
Hgb urine dipstick: NEGATIVE
Ketones, ur: NEGATIVE mg/dL
Leukocytes,Ua: NEGATIVE
Nitrite: NEGATIVE
Protein, ur: NEGATIVE mg/dL
Specific Gravity, Urine: 1.011 (ref 1.005–1.030)
pH: 6 (ref 5.0–8.0)

## 2024-10-30 LAB — TROPONIN T, HIGH SENSITIVITY
Troponin T High Sensitivity: 34 ng/L — ABNORMAL HIGH (ref 0–19)
Troponin T High Sensitivity: 29 ng/L — ABNORMAL HIGH (ref 0–19)

## 2024-10-30 LAB — CBC
HCT: 37.2 % (ref 36.0–46.0)
Hemoglobin: 12.1 g/dL (ref 12.0–15.0)
MCH: 28.5 pg (ref 26.0–34.0)
MCHC: 32.5 g/dL (ref 30.0–36.0)
MCV: 87.5 fL (ref 80.0–100.0)
Platelets: 268 K/uL (ref 150–400)
RBC: 4.25 MIL/uL (ref 3.87–5.11)
RDW: 14.6 % (ref 11.5–15.5)
WBC: 7.2 K/uL (ref 4.0–10.5)
nRBC: 0.3 % — ABNORMAL HIGH (ref 0.0–0.2)

## 2024-10-30 LAB — BASIC METABOLIC PANEL WITH GFR
Anion gap: 15 (ref 5–15)
BUN: 10 mg/dL (ref 8–23)
CO2: 28 mmol/L (ref 22–32)
Calcium: 10 mg/dL (ref 8.9–10.3)
Chloride: 97 mmol/L — ABNORMAL LOW (ref 98–111)
Creatinine, Ser: 1.12 mg/dL — ABNORMAL HIGH (ref 0.44–1.00)
GFR, Estimated: 56 mL/min — ABNORMAL LOW
Glucose, Bld: 102 mg/dL — ABNORMAL HIGH (ref 70–99)
Potassium: 3.6 mmol/L (ref 3.5–5.1)
Sodium: 140 mmol/L (ref 135–145)

## 2024-10-30 LAB — PRO BRAIN NATRIURETIC PEPTIDE: Pro Brain Natriuretic Peptide: 766 pg/mL — ABNORMAL HIGH

## 2024-10-30 LAB — CK: Total CK: 117 U/L (ref 38–234)

## 2024-10-30 LAB — PHOSPHORUS: Phosphorus: 3.5 mg/dL (ref 2.5–4.6)

## 2024-10-30 LAB — MAGNESIUM: Magnesium: 1.8 mg/dL (ref 1.7–2.4)

## 2024-10-30 MED ORDER — ENSURE PLUS HIGH PROTEIN PO LIQD: 237.0000 mL | Freq: Two times a day (BID) | ORAL | Status: DC

## 2024-10-30 MED ORDER — SODIUM CHLORIDE 0.9 % IV BOLUS
1000.0000 mL | Freq: Once | INTRAVENOUS | Status: AC
  Administered 2024-10-30: 1000 mL via INTRAVENOUS

## 2024-10-30 MED ORDER — THIAMINE HCL 100 MG/ML IJ SOLN
500.0000 mg | Freq: Once | INTRAVENOUS | Status: AC
  Administered 2024-10-30: 500 mg via INTRAVENOUS
  Filled 2024-10-30: qty 5

## 2024-10-30 MED ORDER — THIAMINE HCL 100 MG/ML IJ SOLN: 100.0000 mg | Freq: Every day | INTRAMUSCULAR | Status: DC

## 2024-10-30 MED ORDER — ENOXAPARIN SODIUM 40 MG/0.4ML IJ SOSY
40.0000 mg | PREFILLED_SYRINGE | Freq: Every day | INTRAMUSCULAR | Status: DC
  Filled 2024-10-30: qty 0.4

## 2024-10-30 MED ORDER — HYDROXYZINE HCL 50 MG PO TABS: 50.0000 mg | ORAL_TABLET | Freq: Three times a day (TID) | ORAL | Status: DC | PRN

## 2024-10-30 MED ORDER — ALBUTEROL SULFATE HFA 108 (90 BASE) MCG/ACT IN AERS: 2.0000 | INHALATION_SPRAY | RESPIRATORY_TRACT | Status: DC | PRN

## 2024-10-30 MED ORDER — LORAZEPAM 1 MG PO TABS
1.0000 mg | ORAL_TABLET | ORAL | Status: DC | PRN
  Administered 2024-10-31: 1 mg via ORAL
  Filled 2024-10-30: qty 1

## 2024-10-30 MED ORDER — GABAPENTIN 300 MG PO CAPS
300.0000 mg | ORAL_CAPSULE | Freq: Three times a day (TID) | ORAL | Status: DC
  Administered 2024-10-30 – 2024-10-31 (×2): 300 mg via ORAL
  Filled 2024-10-30 (×2): qty 1

## 2024-10-30 MED ORDER — LORAZEPAM 2 MG/ML IJ SOLN: 1.0000 mg | INTRAMUSCULAR | Status: DC | PRN

## 2024-10-30 MED ORDER — OXYCODONE HCL 5 MG PO TABS: 5.0000 mg | ORAL_TABLET | Freq: Four times a day (QID) | ORAL | Status: DC | PRN

## 2024-10-30 MED ORDER — LORAZEPAM 2 MG/ML IJ SOLN: 0.0000 mg | Freq: Two times a day (BID) | INTRAMUSCULAR | Status: DC

## 2024-10-30 MED ORDER — DIPHENHYDRAMINE HCL 50 MG/ML IJ SOLN: 12.5000 mg | Freq: Three times a day (TID) | INTRAMUSCULAR | Status: DC | PRN

## 2024-10-30 MED ORDER — PANTOPRAZOLE SODIUM 40 MG PO TBEC
40.0000 mg | DELAYED_RELEASE_TABLET | Freq: Every day | ORAL | Status: DC
  Administered 2024-10-31: 40 mg via ORAL
  Filled 2024-10-30: qty 1

## 2024-10-30 MED ORDER — NICOTINE 21 MG/24HR TD PT24
21.0000 mg | MEDICATED_PATCH | Freq: Every day | TRANSDERMAL | Status: DC
  Filled 2024-10-30: qty 1

## 2024-10-30 MED ORDER — ALBUTEROL SULFATE (2.5 MG/3ML) 0.083% IN NEBU: 2.5000 mg | INHALATION_SOLUTION | RESPIRATORY_TRACT | Status: DC | PRN

## 2024-10-30 MED ORDER — THIAMINE MONONITRATE 100 MG PO TABS
100.0000 mg | ORAL_TABLET | Freq: Every day | ORAL | Status: DC
  Administered 2024-10-31: 100 mg via ORAL
  Filled 2024-10-30: qty 1

## 2024-10-30 MED ORDER — HYDRALAZINE HCL 20 MG/ML IJ SOLN: 5.0000 mg | INTRAMUSCULAR | Status: DC | PRN

## 2024-10-30 MED ORDER — PHENOBARBITAL SODIUM 130 MG/ML IJ SOLN
130.0000 mg | Freq: Once | INTRAMUSCULAR | Status: AC
  Administered 2024-10-30: 130 mg via INTRAVENOUS
  Filled 2024-10-30: qty 1

## 2024-10-30 MED ORDER — METHOCARBAMOL 500 MG PO TABS: 500.0000 mg | ORAL_TABLET | Freq: Three times a day (TID) | ORAL | Status: DC | PRN

## 2024-10-30 MED ORDER — LORAZEPAM 2 MG/ML IJ SOLN: 0.0000 mg | Freq: Four times a day (QID) | INTRAMUSCULAR | Status: DC

## 2024-10-30 MED ORDER — FOLIC ACID 1 MG PO TABS
1.0000 mg | ORAL_TABLET | Freq: Every day | ORAL | Status: DC
  Administered 2024-10-30: 1 mg via ORAL
  Filled 2024-10-30: qty 1

## 2024-10-30 MED ORDER — POLYETHYLENE GLYCOL 3350 17 G PO PACK: 17.0000 g | PACK | Freq: Every day | ORAL | Status: DC | PRN

## 2024-10-30 MED ORDER — SENNOSIDES-DOCUSATE SODIUM 8.6-50 MG PO TABS
1.0000 | ORAL_TABLET | Freq: Two times a day (BID) | ORAL | Status: DC
  Administered 2024-10-30: 1 via ORAL
  Filled 2024-10-30 (×2): qty 1

## 2024-10-30 MED ORDER — ADULT MULTIVITAMIN W/MINERALS CH
1.0000 | ORAL_TABLET | Freq: Every day | ORAL | Status: DC
  Administered 2024-10-30: 1 via ORAL
  Filled 2024-10-30: qty 1

## 2024-10-30 MED ORDER — ASPIRIN 81 MG PO TBEC
81.0000 mg | DELAYED_RELEASE_TABLET | Freq: Every day | ORAL | Status: DC
  Administered 2024-10-31: 81 mg via ORAL
  Filled 2024-10-30: qty 1

## 2024-10-30 MED ORDER — DM-GUAIFENESIN ER 30-600 MG PO TB12: 1.0000 | ORAL_TABLET | Freq: Two times a day (BID) | ORAL | Status: DC | PRN

## 2024-10-30 MED ORDER — ACETAMINOPHEN 325 MG PO TABS: 650.0000 mg | ORAL_TABLET | Freq: Four times a day (QID) | ORAL | Status: DC | PRN

## 2024-10-30 MED ORDER — MAGNESIUM SULFATE 2 GM/50ML IV SOLN
2.0000 g | Freq: Once | INTRAVENOUS | Status: AC
  Administered 2024-10-30: 2 g via INTRAVENOUS
  Filled 2024-10-30: qty 50

## 2024-10-30 NOTE — ED Provider Notes (Signed)
 "  [facility] Provider Note    Event Date/Time   First MD Initiated Contact with Patient 10/30/24 1604     (approximate)   History   Chest Pain and Weakness  Pt comes with c/o cp and weakness that started last night. Pt state she is having spells where she can't walk and has seizures. Pt not on meds for this.  Pt states mid sternal cp and sob.    HPI Carla Martinez is a 64 y.o. 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  Appears she primarily came to emergency department today because she felt she has been having more tremors than usual.  Contrary to triage note, is not having any seizures.  Says she yeah he said feels generalized weakness.  Denies any focal weakness, numbness, speech difficulty.  He is not that she may have fallen a few times but denies head strike or LOC. - Was in her usual state of health yesterday - Tells me she drank about 2 bottles of whiskey yesterday.  Does have issues with binge drinking though is not a daily drinker. - Denies any other substance use - Separately endorses some panic episodes of chest discomfort  Per chart review, patient was admitted in December 2025 after presenting for chest pain.  Cardiology consulted, negative Lexiscan  Myoview  stress test, 48 open recommend continuing medical therapy for nonobstructive coronary artery disease.  Noted to have prolonged QTc of 653.  Held Seroquel , trazodone , hydroxyzine , fluoxetine .  Continue aspirin , Toprol , Lipitor added.  Yeah Seroquel  and fluoxetine  restarted after normalization of QTc.      Physical Exam   Triage Vital Signs: ED Triage Vitals [10/30/24 1427]  Encounter  Vitals Group     BP (!) 144/91     Girls Systolic BP Percentile      Girls Diastolic BP Percentile      Boys Systolic BP Percentile      Boys Diastolic BP Percentile      Pulse Rate 65     Resp 18     Temp 98 F (36.7 C)     Temp src      SpO2 99 %     Weight 93 lb (42.2 kg)     Height 5' 4 (1.626 m)     Head Circumference      Peak Flow      Pain Score 0     Pain Loc      Pain Education      Exclude from Growth Chart     Most recent vital signs: Vitals:   10/30/24 2127 10/30/24 2300  BP: 127/77   Pulse: 65 (!) 58  Resp:  14  Temp:  98.2 F (36.8 C)  SpO2:  100%     General: Awake, no distress.  CV:  Good peripheral perfusion. RRR, RP 2+ Resp:  Normal effort. CTAB Abd:  No distention. Nontender to deep palpation throughout Neuro:  Aox4, CN II-XII intact, FNF wnl, finger taps fast b/l, 5/5 strength in bilateral finger extension/grip, arm flexion/extension, EHL/FHL. BUE AG 10+ sec no drift, BLE AG 5+ sec no drift.  SI LT.  Initially ambulates with steady gait but then suddenly developed strange jerking movements and grabs me.    ED Results /  Procedures / Treatments   Labs (all labs ordered are listed, but only abnormal results are displayed) Labs Reviewed  BASIC METABOLIC PANEL WITH GFR - Abnormal; Notable for the following components:      Result Value   Chloride 97 (*)    Glucose, Bld 102 (*)    Creatinine, Ser 1.12 (*)    GFR, Estimated 56 (*)    All other components within normal limits  CBC - Abnormal; Notable for the following components:   nRBC 0.3 (*)    All other components within normal limits  URINALYSIS, COMPLETE (UACMP) WITH MICROSCOPIC - Abnormal; Notable for the following components:   Color, Urine YELLOW (*)    APPearance HAZY (*)    All other components within normal limits  PRO BRAIN NATRIURETIC PEPTIDE - Abnormal; Notable for the following components:   Pro Brain Natriuretic Peptide 766.0 (*)    All other components within normal limits   TROPONIN T, HIGH SENSITIVITY - Abnormal; Notable for the following components:   Troponin T High Sensitivity 34 (*)    All other components within normal limits  TROPONIN T, HIGH SENSITIVITY - Abnormal; Notable for the following components:   Troponin T High Sensitivity 29 (*)    All other components within normal limits  MAGNESIUM   PHOSPHORUS  CK  VITAMIN B12  VITAMIN B1  BASIC METABOLIC PANEL WITH GFR  CBC     EKG  See ED course below   RADIOLOGY Radiology interpreted by myself and radiology report reviewed.  PROCEDURES:  Critical Care performed: No  Procedures   MEDICATIONS ORDERED IN ED: Medications  dextromethorphan-guaiFENesin  (MUCINEX  DM) 30-600 MG per 12 hr tablet 1 tablet (has no administration in time range)  diphenhydrAMINE  (BENADRYL ) injection 12.5 mg (has no administration in time range)  hydrALAZINE  (APRESOLINE ) injection 5 mg (has no administration in time range)  acetaminophen  (TYLENOL ) tablet 650 mg (has no administration in time range)  nicotine  (NICODERM CQ  - dosed in mg/24 hours) patch 21 mg (21 mg Transdermal Not Given 10/30/24 2135)  methocarbamol  (ROBAXIN ) tablet 500 mg (has no administration in time range)  LORazepam  (ATIVAN ) tablet 1-4 mg (has no administration in time range)    Or  LORazepam  (ATIVAN ) injection 1-4 mg (has no administration in time range)  thiamine  (VITAMIN B1) tablet 100 mg ( Oral See Alternative 10/30/24 2115)  folic acid  (FOLVITE ) tablet 1 mg (1 mg Oral Given 10/30/24 2137)  LORazepam  (ATIVAN ) injection 0-4 mg ( Intravenous Not Given 10/30/24 2133)    Followed by  LORazepam  (ATIVAN ) injection 0-4 mg (has no administration in time range)  feeding supplement (ENSURE PLUS HIGH PROTEIN) liquid 237 mL (has no administration in time range)  enoxaparin  (LOVENOX ) injection 40 mg (40 mg Subcutaneous Not Given 10/30/24 2135)  albuterol  (PROVENTIL ) (2.5 MG/3ML) 0.083% nebulizer solution 2.5 mg (has no administration in time range)   senna-docusate (Senokot-S) tablet 1 tablet (1 tablet Oral Given 10/30/24 2312)  polyethylene glycol (MIRALAX  / GLYCOLAX ) packet 17 g (has no administration in time range)  oxyCODONE  (Oxy IR/ROXICODONE ) immediate release tablet 5 mg (has no administration in time range)  aspirin  EC tablet 81 mg (has no administration in time range)  hydrOXYzine  (ATARAX ) tablet 50 mg (has no administration in time range)  pantoprazole  (PROTONIX ) EC tablet 40 mg (has no administration in time range)  gabapentin  (NEURONTIN ) capsule 300 mg (300 mg Oral Given 10/30/24 2312)  sodium chloride  0.9 % bolus 1,000 mL (0 mLs Intravenous Stopped 10/30/24 2127)  magnesium  sulfate IVPB 2  g 50 mL (2 g Intravenous New Bag/Given 10/30/24 2228)  PHENObarbital  (LUMINAL) injection 130 mg (130 mg Intravenous Given 10/30/24 2139)  thiamine  (VITAMIN B1) 500 mg in sodium chloride  0.9 % 50 mL IVPB (0 mg Intravenous Stopped 10/30/24 2227)     IMPRESSION / MDM / ASSESSMENT AND PLAN / ED COURSE  I reviewed the triage vital signs and the nursing notes.                              DDX/MDM/AP: Differential diagnosis includes, but is not limited to, anemia, electrolyte abnormality, consider dehydration contributing.  Nonfocal neurologic exam here, no clear evidence to suggest underlying stroke and does not endorse any disequilibrium.  Regarding the episodic jerking movements in her legs that only seem to occur while walking and are very intermittent in nature, these do not fit any typical seizure or myoclonic pattern that I am aware of--consider possibility of secondary gain, less likely secondary to underlying vitamin deficiency in the setting of underlying use disorder or electrolyte abnormality.  Did have recent reassuring negative cardiac workup, highly doubt cardiac etiology of presentation today.  Consider underlying infection contributing to presentation though no clear localizing symptoms.  Plan: - Labs - CT head Chest x-ray - EKG -   IV fluid -  reassess  Patient's presentation is most consistent with acute presentation with potential threat to life or bodily function.  The patient is on the cardiac monitor to evaluate for evidence of arrhythmia and/or significant heart rate changes.  ED course below.  Workup with mild AKI, received IV fluids.  Mild troponin elevation which is downtrending on repeat.  On repeat attempts of ambulation patient is initially able to ambulate okay then suddenly develops jerking movements of her bilateral lower extremities.  Unclear etiology of this --given history of heavy alcohol use consider possible B12 or folate deficiency causing lower extremity discoordination.  Given her new inability to ambulate I did discuss with hospitalist who is amenable to admission for ongoing evaluation and further workup.  No clear evidence of alcohol withdrawal at this time.  Clinical Course as of 10/30/24 2334  [place] Oct 30, 2024  1614 CBC reviewed, unremarkable [MM]  1614 BMP reviewed, very mild bump in creatinine, otherwise unremarkable [MM]  1619 CXR: IMPRESSION: Right lower lobe scarring. No acute findings.   [MM]  1619 CTH: IMPRESSION: Negative noncontrast head CT.   [MM]  1620 Ecg = sinus rhythm, rate 66, no gross ST elevation or depression, no significant repolarization normality, normal axis, prolonged QTc at 530.  No clear evidence of ischemia nor arrhythmia on my interpretation. [MM]  1825 Rpt trop stable and remains within baseline range Mag wnl [MM]  1942 Urinalysis with very mild pyuria, otherwise unremarkable [MM]  1943 Attempted to ambulate patient again.  She is able to stand up independently but then after taking a few steps seems to fall and grab at test with jerking movements in her legs. [MM]  2012 Etiology of presentation remains unclear. ?Myoclonic jerking with possible vitamin deficiency causing strange neurologic symptoms in the setting of heavy alcohol use  Will discuss with  hospitalist, paged [MM]    Clinical Course User Index [MM] Clarine Ozell LABOR, MD     FINAL CLINICAL IMPRESSION(S) / ED DIAGNOSES   Final diagnoses:  Weakness  Difficulty walking  Jerking movements of extremities     Rx / DC Orders   ED Discharge Orders  None        Note:  This document was prepared using Dragon voice recognition software and may include unintentional dictation errors.   Clarine Ozell LABOR, MD 10/30/24 2334  "
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
## 2024-10-30 NOTE — ED Triage Notes (Signed)
 Pt comes with c/o cp and weakness that started last night. Pt state she is having spells where she can't walk and has seizures. Pt not on meds for this.  Pt states mid sternal cp and sob.

## 2024-10-31 ENCOUNTER — Other Ambulatory Visit: Payer: Self-pay

## 2024-10-31 DIAGNOSIS — R0789 Other chest pain: Principal | ICD-10-CM

## 2024-10-31 DIAGNOSIS — R253 Fasciculation: Secondary | ICD-10-CM | POA: Diagnosis not present

## 2024-10-31 DIAGNOSIS — I5032 Chronic diastolic (congestive) heart failure: Secondary | ICD-10-CM | POA: Diagnosis not present

## 2024-10-31 LAB — BASIC METABOLIC PANEL WITH GFR
Anion gap: 10 (ref 5–15)
BUN: 13 mg/dL (ref 8–23)
CO2: 28 mmol/L (ref 22–32)
Calcium: 8.8 mg/dL — ABNORMAL LOW (ref 8.9–10.3)
Chloride: 102 mmol/L (ref 98–111)
Creatinine, Ser: 1 mg/dL (ref 0.44–1.00)
GFR, Estimated: >60 mL/min
Glucose, Bld: 104 mg/dL — ABNORMAL HIGH (ref 70–99)
Potassium: 3.9 mmol/L (ref 3.5–5.1)
Sodium: 140 mmol/L (ref 135–145)

## 2024-10-31 LAB — VITAMIN B12: Vitamin B-12: 369 pg/mL (ref 180–914)

## 2024-10-31 LAB — CBC
HCT: 30.9 % — ABNORMAL LOW (ref 36.0–46.0)
Hemoglobin: 10.1 g/dL — ABNORMAL LOW (ref 12.0–15.0)
MCH: 28.5 pg (ref 26.0–34.0)
MCHC: 32.7 g/dL (ref 30.0–36.0)
MCV: 87 fL (ref 80.0–100.0)
Platelets: 211 K/uL (ref 150–400)
RBC: 3.55 MIL/uL — ABNORMAL LOW (ref 3.87–5.11)
RDW: 14.5 % (ref 11.5–15.5)
WBC: 6.3 K/uL (ref 4.0–10.5)
nRBC: 0 % (ref 0.0–0.2)

## 2024-10-31 MED ORDER — LIDOCAINE 5 % EX OINT
1.0000 | TOPICAL_OINTMENT | CUTANEOUS | 0 refills | Status: AC | PRN
  Filled 2024-10-31: qty 50, 50d supply, fill #0

## 2024-10-31 MED ORDER — NICOTINE 21 MG/24HR TD PT24
21.0000 mg | MEDICATED_PATCH | Freq: Every day | TRANSDERMAL | 0 refills | Status: AC
  Filled 2024-10-31: qty 28, 28d supply, fill #0

## 2024-10-31 NOTE — ED Notes (Signed)
 1140--patietn says she does not want to wait for SW to see her.  She agrees to talk to her pcp about her sx.  Says she has not desire to drink alcohol now.  I told her that she can still talk to pcp about her difficulty sleeping.  She is unsteady on feet at times--when she first got up.  Encouraged to be careful, especially when first getting up.  Also explained this to patient's mother who was picking her up.

## 2024-10-31 NOTE — TOC Progression Note (Signed)
 Transition of Care [facility]) - Progression Note    Patient Details  Name: Carla Martinez MRN: [id number] Date of Birth: January 17, 1961  Transition of Care [facility] CM/SW Contact  Victory Jackquline RAMAN, RN Phone Number: 10/31/2024, 12:34 PM  Clinical Narrative:     [facility] consult received for substance abuse education/counseling. TOC does not provide counseling. Resources have been added to the AVS.                     Expected Discharge Plan and Services         Expected Discharge Date: 10/31/24                                     Social Drivers of Health (SDOH) Interventions SDOH Screenings   Food Insecurity: No Food Insecurity (10/01/2024)  Housing: Low Risk (10/01/2024)  Transportation Needs: No Transportation Needs (10/01/2024)  Utilities: Not At Risk (10/01/2024)  Depression (PHQ2-9): Low Risk (09/14/2024)  Recent Concern: Depression (PHQ2-9) - High Risk (09/13/2024)  Financial Resource Strain: Patient Declined (04/15/2024)   Received from [facility]  Tobacco Use: High Risk (10/30/2024)    Readmission Risk Interventions     No data to display

## 2024-10-31 NOTE — ED Notes (Signed)
 Pt given a menu to order her own breakfast

## 2024-10-31 NOTE — ED Notes (Signed)
 Pt is CAOx4 this morning and reports back pain from a previous fall. Pt reports that her tremors have gotten better. No visible tremors noted. Pt able to get our of bed and ambulate to the in-room restroom.

## 2024-10-31 NOTE — ED Notes (Signed)
 This nurse spoke with patient regarding medications that she thought she would be discharged on. Spoke with Dr Laurita and she will not go home on meds for withdraw as she is not in withdrawal and she was then requesting meds for pain for her broken back. I let her know that she needed to see her primary care provider for this as it was a chronic condition.

## 2024-10-31 NOTE — Discharge Summary (Signed)
 " Physician Discharge Summary   Patient: Carla Martinez MRN: [id number] DOB: 10/04/1961  Admit date:     10/30/2024  Discharge date: 10/31/2024  Discharge Physician: Murvin Mana   PCP: Wendee Lynwood HERO, NP   Recommendations at discharge:   Follow-up with PCP in 1 week.  Discharge Diagnoses: Principal Problem:   Chest pain Active Problems:   Coronary artery disease   Jerking   Chronic diastolic CHF (congestive heart failure) (HCC)   COPD (chronic obstructive pulmonary disease) (HCC)   Fall at home, initial encounter   Essential hypertension   PAF (paroxysmal atrial fibrillation) (HCC)   AKI (acute kidney injury)   History of alcohol abuse   Tobacco use   Prolonged Q-T interval on ECG   Anxiety and depression   Protein-calorie malnutrition, severe  Resolved Problems:   * No resolved hospital problems. *  Hospital Course: Carla Martinez is a 64 y.o. female with medical history significant of tobacco and alcohol abuse, COPD, HTH, dCHF, depression with anxiety, adjustment disorder, PAF not on anticoagulants, tremor, alcohol withdrawal seizure, who presents with jerking in all extremities, chest pain.   Per patient, patient has been having intermittent chest pain for many years, it happens every few days.  The pain normally localize in the lower sternum.  Patient was recently admitted to the hospital and discharged on 10/03/2024, at that time, nuclear stress test was low risk, echocardiogram has a mild pericardial effusion, carefully reviewed Dr. Renato note, there is no cardiac tamponade (which he was placed in echo report).  Patient does have some tenderness in the lower sternum, pain is unlikely to be cardiac in origin, no reason to repeat stress nuclear test.  I will continue metoprolol , add Lidoderm  patch applied to the lower sternum.  Patient also has been having tremors for many years, she also had a frequent falls.  She states that she is under a lot of stress, she is  taking care of her severely disabled son of 76 year old, she also had 32 year old mother living together.  She states that she frequently has tremors, that she feels weak, then she fell on the ground.  Condition more consistent with anxiety for which she is taking high-dose of Seroquel .  She feels stronger today, she refused to consider any possibility of SNF placement.  At this point, she is medically stable for discharge.        Consultants: None Procedures performed: None  Disposition: Home health Diet recommendation:  Discharge Diet Orders (From admission, onward)     Start     Ordered   10/31/24 0000  Diet - low sodium heart healthy        10/31/24 0930           Cardiac diet DISCHARGE MEDICATION: Allergies as of 10/31/2024   No Known Allergies      Medication List     STOP taking these medications    hydrOXYzine  50 MG capsule Commonly known as: VISTARIL        TAKE these medications    FLUoxetine  20 MG capsule Commonly known as: PROZAC  TAKE 3 CAPSULES BY MOUTH ONCE DAILY   furosemide  20 MG tablet Commonly known as: LASIX  Take 1 tablet (20 mg total) by mouth daily.   gabapentin  300 MG capsule Commonly known as: NEURONTIN  Take 1 capsule (300 mg total) by mouth 3 (three) times daily.   lidocaine  5 % ointment Commonly known as: XYLOCAINE  Apply 1 Application topically as needed. Apply to lower sternum daily  metoprolol  tartrate 25 MG tablet Commonly known as: LOPRESSOR  Take 0.5 tablets (12.5 mg total) by mouth 2 (two) times daily as needed.   multivitamin with minerals Tabs tablet Take 1 tablet by mouth daily.   nicotine  21 mg/24hr patch Commonly known as: NICODERM CQ  - dosed in mg/24 hours Place 1 patch (21 mg total) onto the skin daily.   oxyCODONE  5 MG immediate release tablet Commonly known as: Roxicodone  Take 1 tablet (5 mg total) by mouth every 8 (eight) hours as needed.   pantoprazole  40 MG tablet Commonly known as: PROTONIX  Take 40 mg  by mouth daily.   potassium chloride  SA 20 MEQ tablet Commonly known as: KLOR-CON  M Take 1 tablet (20 mEq total) by mouth daily.   QUEtiapine  400 MG tablet Commonly known as: SEROQUEL  Take 400 mg by mouth at bedtime. What changed: Another medication with the same name was removed. Continue taking this medication, and follow the directions you see here.   QUEtiapine  100 MG tablet Commonly known as: SEROQUEL  Take 1 tablet by mouth twice daily What changed: Another medication with the same name was removed. Continue taking this medication, and follow the directions you see here.   thiamine  100 MG tablet Commonly known as: Vitamin B-1 Take 1 tablet (100 mg total) by mouth daily.   vitamin D3 25 MCG tablet Commonly known as: CHOLECALCIFEROL  Take 2 tablets (2,000 Units total) by mouth daily.        Follow-up Information     Wendee Lynwood HERO, NP Follow up in 1 week(s).   Specialties: Nurse Practitioner, Family Medicine Contact information: [address] [place] [address] [phone]                Discharge Exam: Fredricka Weights   10/30/24 1427  Weight: 42.2 kg   General exam: Appears calm and comfortable  Respiratory system: Clear to auscultation. Respiratory effort normal. Cardiovascular system: S1 & S2 heard, RRR. No JVD, murmurs, rubs, gallops or clicks. No pedal edema. Gastrointestinal system: Abdomen is nondistended, soft and nontender. No organomegaly or masses felt. Normal bowel sounds heard. Central nervous system: Alert and oriented. No focal neurological deficits. Extremities: Symmetric 5 x 5 power. Skin: No rashes, lesions or ulcers Psychiatry: Judgement and insight appear normal. Mood & affect appropriate.  Tenderness in the lower sternum, reproduces the chest pain.   Condition at discharge: fair  The results of significant diagnostics from this hospitalization (including imaging, microbiology, ancillary and laboratory) are listed below for  reference.   Imaging Studies: DG Chest 2 View Result Date: 10/30/2024 CLINICAL DATA:  Chest pain.  Shortness of breath.  Weakness. EXAM: CHEST - 2 VIEW COMPARISON:  10/02/2019 FINDINGS: The heart size and mediastinal contours are within normal limits. Right lower lobe scarring shows no significant change since previous study. No evidence of focal consolidation or pleural effusion. IMPRESSION: Right lower lobe scarring. No acute findings. Electronically Signed   By: Norleen DELENA Kil M.D.   On: 10/30/2024 15:08   CT Head Wo Contrast Result Date: 10/30/2024 CLINICAL DATA:  Seizure. EXAM: CT HEAD WITHOUT CONTRAST TECHNIQUE: Contiguous axial images were obtained from the base of the skull through the vertex without intravenous contrast. RADIATION DOSE REDUCTION: This exam was performed according to the departmental dose-optimization program which includes automated exposure control, adjustment of the mA and/or kV according to patient size and/or use of iterative reconstruction technique. COMPARISON:  08/13/2024 FINDINGS: Brain: No evidence of intracranial hemorrhage, acute infarction, hydrocephalus, extra-axial collection, or mass lesion/mass  effect. Vascular:  No hyperdense vessel or other acute findings. Skull: No evidence of fracture or other significant bone abnormality. Sinuses/Orbits:  No acute findings. Other: None. IMPRESSION: Negative noncontrast head CT. Electronically Signed   By: Norleen DELENA Kil M.D.   On: 10/30/2024 15:06   NM Myocar Multi W/Spect W/Wall Motion / EF Result Date: 10/03/2024   The study is normal. The study is low risk.   No ST deviation was noted. The ECG was negative for ischemia.   LV perfusion is normal. There is no evidence of ischemia. There is no evidence of infarction.   Left ventricular function is normal. The left ventricular ejection fraction is normal (55-65%). End diastolic cavity size is normal. End systolic cavity size is normal.   Coronary calcium  was present on the  attenuation correction CT images. Mild coronary calcifications were present.   ECHOCARDIOGRAM COMPLETE Result Date: 10/02/2024    ECHOCARDIOGRAM REPORT   Patient Name:   LORIEN SHINGLER Date of Exam: 10/02/2024 Medical Rec #:  [id number]            Height:       62.0 in Accession #:    [id number]           Weight:       94.0 lb Date of Birth:  [birth year]            BSA:          1.387 m Patient Age:    63 years             BP:           108/70 mmHg Patient Gender: F                    HR:           63 bpm. Exam Location:  [facility] Procedure: 2D Echo, Color Doppler, Cardiac Doppler, Strain Analysis and            Intracardiac Opacification Agent (Both Spectral and Color Flow            Doppler were utilized during procedure). Indications:     Chest Pain  History:         Patient has prior history of Echocardiogram examinations, most                  recent 08/05/2024. CHF and Cardiomyopathy, CAD, Abnormal ECG,                  Signs/Symptoms:Hypotension, Shortness of Breath and Chest Pain;                  Risk Factors:Current Smoker. Altered Mental Status.  Sonographer:     Logan Shove RDCS Referring Phys:  [id number] RYAN M DUNN Diagnosing Phys: Shelda Bruckner MD IMPRESSIONS  1. Left ventricular ejection fraction, by estimation, is 55 to 60%. The left ventricle has normal function. The left ventricle has no regional wall motion abnormalities. Left ventricular diastolic parameters were normal.  2. Right ventricular systolic function is normal. The right ventricular size is normal.  3. Left atrial size was mild to moderately dilated.  4. A small pericardial effusion is present. Findings are consistent with cardiac tamponade.  5. 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 No evidence of mitral stenosis. 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  6. The aortic valve was not well visualized. There is mild calcification of the aortic  valve. Aortic valve  regurgitation is not visualized. No aortic stenosis is present.  7. The inferior vena cava is normal in size with greater than 50% respiratory variability, suggesting right atrial pressure of 3 mmHg. Comparison(s): No significant change from prior study. Prior images reviewed side by side. Conclusion(s)/Recommendation(s): Otherwise normal echocardiogram, with minor abnormalities described in the report. FINDINGS  Left Ventricle: Left ventricular ejection fraction, by estimation, is 55 to 60%. The left ventricle has normal function. The left ventricle has no regional wall motion abnormalities. Definity  contrast agent was given IV to delineate the left ventricular  endocardial borders. Global longitudinal strain performed but not reported based on interpreter judgement due to suboptimal tracking. The left ventricular internal cavity size was normal in size. There is no left ventricular hypertrophy. Left ventricular diastolic parameters were normal. Right Ventricle: The right ventricular size is normal. Right vetricular wall thickness was not well visualized. Right ventricular systolic function is normal. Left Atrium: Left atrial size was mild to moderately dilated. Right Atrium: Right atrial size was normal in size. Pericardium: A small pericardial effusion is present. There is evidence of cardiac tamponade. 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 Mild mitral valve regurgitation. No evidence of mitral valve stenosis. Tricuspid Valve: The tricuspid valve is not well visualized. Tricuspid valve regurgitation is trivial. No evidence of tricuspid stenosis. Aortic Valve: The aortic valve was not well visualized. There is mild calcification of the aortic valve. Aortic valve regurgitation is not visualized. No aortic stenosis is present. Aortic valve peak gradient measures 4.2 mmHg. Pulmonic Valve: The pulmonic valve was not well visualized.  Pulmonic valve regurgitation is not visualized. No evidence of pulmonic stenosis. Aorta: The aortic root, ascending aorta, aortic arch and descending aorta are all structurally normal, with no evidence of dilitation or obstruction. Venous: The inferior vena cava is normal in size with greater than 50% respiratory variability, suggesting right atrial pressure of 3 mmHg. IAS/Shunts: The atrial septum is grossly normal.  LEFT VENTRICLE PLAX 2D LVIDd:         4.36 cm      Diastology LVIDs:         2.95 cm      LV e' medial:    6.31 cm/s LV PW:         1.00 cm      LV E/e' medial:  10.2 LV IVS:        0.90 cm      LV e' lateral:   7.51 cm/s LVOT diam:     2.10 cm      LV E/e' lateral: 8.6 LVOT Area:     3.46 cm LV IVRT:       102 msec  LV Volumes (MOD) LV vol d, MOD A2C: 139.0 ml LV vol d, MOD A4C: 117.0 ml LV vol s, MOD A2C: 67.5 ml LV vol s, MOD A4C: 50.6 ml LV SV MOD A2C:     71.5 ml LV SV MOD A4C:     117.0 ml LV SV MOD BP:      70.2 ml RIGHT VENTRICLE             IVC RV Basal diam:  2.90 cm     IVC diam: 2.00 cm RV S prime:     10.30 cm/s TAPSE (M-mode): 2.4 cm LEFT ATRIUM             Index  RIGHT ATRIUM           Index LA diam:        2.70 cm 1.95 cm/m   RA Area:     15.50 cm LA Vol (A2C):   57.1 ml 41.17 ml/m  RA Volume:   36.30 ml  26.17 ml/m LA Vol (A4C):   53.1 ml 38.29 ml/m LA Biplane Vol: 55.9 ml 40.30 ml/m  AORTIC VALVE AV Area (Vmax): 3.01 cm AV Vmax:        103.00 cm/s AV Peak Grad:   4.2 mmHg LVOT Vmax:      89.60 cm/s  AORTA Ao Root diam: 3.30 cm Ao Asc diam:  3.30 cm MITRAL VALVE MV Area (PHT): 2.73 cm    SHUNTS MV Decel Time: 278 msec    Systemic Diam: 2.10 cm MV E velocity: 64.50 cm/s MV A velocity: 67.10 cm/s MV E/A ratio:  0.96 Shelda Bruckner MD Electronically signed by Shelda Bruckner MD Signature Date/Time: 10/02/2024/3:09:24 PM    Final    DG Chest 2 View Result Date: 10/01/2024 EXAM: 2 VIEW(S) XRAY OF THE CHEST 10/01/2024 03:44:00 PM COMPARISON: Chest x-ray  06/21/2024. CT chest 07/01/2024. CLINICAL HISTORY: CP FINDINGS: LUNGS AND PLEURA: Linear band of scarring in the right lower lobe which is unchanged from prior CT. No focal pulmonary opacity. No pleural effusion. No pneumothorax. HEART AND MEDIASTINUM: Calcified aorta. BONES AND SOFT TISSUES: Stable L2 compression fracture. IMPRESSION: 1. No acute cardiopulmonary process. 2. Stable linear band of scarring in the right lower lobe. Electronically signed by: Greig Pique MD 10/01/2024 04:01 PM EST RP Workstation: [id number]    Microbiology: Results for orders placed or performed in visit on 08/26/23  Fecal occult blood, imunochemical     Status: None   Collection Time: 08/26/23  1:44 PM   Specimen: Stool  Result Value Ref Range Status   Fecal Occult Bld Negative Negative Final    Labs: CBC: Recent Labs  Lab 10/30/24 1437 10/31/24 0430  WBC 7.2 6.3  HGB 12.1 10.1*  HCT 37.2 30.9*  MCV 87.5 87.0  PLT 268 211   Basic Metabolic Panel: Recent Labs  Lab 10/30/24 1437 10/30/24 1732 10/30/24 1735 10/31/24 0430  NA 140  --   --  140  K 3.6  --   --  3.9  CL 97*  --   --  102  CO2 28  --   --  28  GLUCOSE 102*  --   --  104*  BUN 10  --   --  13  CREATININE 1.12*  --   --  1.00  CALCIUM  10.0  --   --  8.8*  MG  --   --  1.8  --   PHOS  --  3.5  --   --    Liver Function Tests: No results for input(s): AST, ALT, ALKPHOS, BILITOT, PROT, ALBUMIN  in the last 168 hours. CBG: No results for input(s): GLUCAP in the last 168 hours.  Discharge time spent: 35 minutes.  Signed: Murvin Mana, MD [facility] 10/31/2024 "

## 2024-11-01 ENCOUNTER — Telehealth: Payer: Self-pay

## 2024-11-01 NOTE — Transitions of Care (Post Inpatient/ED Visit) (Unsigned)
" ° °  11/01/2024  Name: Tenesia Escudero MRN: [id number] DOB: [birth year]  Today's TOC FU Call Status: Today's TOC FU Call Status:: Unsuccessful Call (1st Attempt) TOC FU Call Complete Date: 11/01/24  Attempted to reach the patient regarding the most recent Inpatient/ED visit.  Follow Up Plan: Additional outreach attempts will be made to reach the patient to complete the Transitions of Care (Post Inpatient/ED visit) call.   Signature Julian Lemmings, LPN [facility] Nurse Health Advisor Direct Dial [phone]  "

## 2024-11-02 ENCOUNTER — Encounter (HOSPITAL_COMMUNITY): Payer: Self-pay

## 2024-11-02 ENCOUNTER — Telehealth (HOSPITAL_COMMUNITY): Admitting: Physician Assistant

## 2024-11-02 ENCOUNTER — Other Ambulatory Visit: Payer: Self-pay | Admitting: Family Medicine

## 2024-11-02 LAB — VITAMIN B1: Vitamin B1 (Thiamine): 243.6 nmol/L — ABNORMAL HIGH (ref 66.5–200.0)

## 2024-11-02 NOTE — Telephone Encounter (Signed)
 Noted

## 2024-11-02 NOTE — Transitions of Care (Post Inpatient/ED Visit) (Signed)
" ° °  11/02/2024  Name: Carla Martinez MRN: [id number] DOB: 01-May-1961  Today's TOC FU Call Status: Today's TOC FU Call Status:: Successful TOC FU Call Completed TOC FU Call Complete Date: 11/02/24  Patient's Name and Date of Birth confirmed. Name, DOB  Transition Care Management Follow-up Telephone Call Date of Discharge: 10/31/24 Discharge Facility: [facility]) Type of Discharge: Inpatient Admission Primary Inpatient Discharge Diagnosis:: chest pain How have you been since you were released from the hospital?: Better Any questions or concerns?: No  Items Reviewed: Did you receive and understand the discharge instructions provided?: Yes Medications obtained,verified, and reconciled?: Partial Review Completed Reason for Partial Mediation Review: patient states changes have been made, but has appt with psyh tomorrow and needs to discuss changes with him to see if this is how he wants her to take it. She will bring up date med list to appt 11/09/24 Any new allergies since your discharge?: No Dietary orders reviewed?: Yes Do you have support at home?: Yes People in Home [RPT]: parent(s)  Medications Reviewed Today: Medications Reviewed Today   Medications were not reviewed in this encounter     Home Care and Equipment/Supplies: Were Home Health Services Ordered?: NA Any new equipment or medical supplies ordered?: NA  Functional Questionnaire: Do you need assistance with bathing/showering or dressing?: No Do you need assistance with meal preparation?: No Do you need assistance with eating?: No Do you have difficulty maintaining continence: No Do you need assistance with getting out of bed/getting out of a chair/moving?: No Do you have difficulty managing or taking your medications?: No  Follow up appointments reviewed: PCP Follow-up appointment confirmed?: Yes Date of PCP follow-up appointment?: 11/09/24 Follow-up Provider: [facility] Follow-up appointment confirmed?: Yes Date of Specialist follow-up appointment?: 11/03/24 Follow-Up Specialty Provider:: pysch Do you need transportation to your follow-up appointment?: No Do you understand care options if your condition(s) worsen?: Yes-patient verbalized understanding    SIGNATURE Julian Lemmings, LPN [facility] Nurse Health Advisor Direct Dial [phone]  "

## 2024-11-03 ENCOUNTER — Other Ambulatory Visit (HOSPITAL_COMMUNITY): Payer: Self-pay | Admitting: Physician Assistant

## 2024-11-03 DIAGNOSIS — F339 Major depressive disorder, recurrent, unspecified: Secondary | ICD-10-CM

## 2024-11-07 ENCOUNTER — Encounter (HOSPITAL_COMMUNITY): Payer: Self-pay

## 2024-11-07 ENCOUNTER — Telehealth (HOSPITAL_COMMUNITY): Payer: Self-pay | Admitting: Physician Assistant

## 2024-11-07 NOTE — Telephone Encounter (Signed)
 Patient called and lvm on Friday, 11/04/24, at 3:57 pm. Patient states that she had an appointment on Wednesday, 11/02/24, at 4:00 pm and waited for 30 minutes for Eddie to join the call. The appointment was marked as a no show. Patient says she needs refills of her medication and believes she should get her refills of her medication because she waited 30 minutes for her appointment to start. I reached out to the patient by phone and their voice mail is full. Will send a My Chart message to work on getting the patient rescheduled. Please advise. Thank you.

## 2024-11-09 ENCOUNTER — Inpatient Hospital Stay: Admitting: Nurse Practitioner

## 2024-11-09 NOTE — Progress Notes (Unsigned)
" ° °  Established Patient Office Visit  Subjective   Patient ID: Carla Martinez, female    DOB: Jun 11, 1961  Age: 64 y.o. MRN: [id number]  No chief complaint on file.   HPI  Patient was seen in the emergency department on 10/30/2024 with chest pain and weakness that started the night before patient endorsed episodes of having spells where she cannot walk and having seizures.  Labs showed mild AKI with resuscitation of IV fluids.  Patient was admitted to the hospital.  Patient was discharged on 10/31/2024. she did refused placement to SNIF  {History (Optional):23778}  ROS    Objective:     There were no vitals taken for this visit. {Vitals History (Optional):23777}  Physical Exam   No results found for any visits on 11/09/24.  {Labs (Optional):23779}  The ASCVD Risk score (Arnett DK, et al., 2019) failed to calculate for the following reasons:   Risk score cannot be calculated because patient has a medical history suggesting prior/existing ASCVD   * - Cholesterol units were assumed    Assessment & Plan:   Problem List Items Addressed This Visit   None   No follow-ups on file.    Adina Crandall, NP  "

## 2024-11-10 ENCOUNTER — Telehealth (HOSPITAL_COMMUNITY): Admitting: Physician Assistant

## 2024-11-10 ENCOUNTER — Ambulatory Visit (HOSPITAL_COMMUNITY): Admitting: Licensed Clinical Social Worker

## 2024-11-10 ENCOUNTER — Encounter (HOSPITAL_COMMUNITY): Payer: Self-pay

## 2024-11-10 ENCOUNTER — Encounter (HOSPITAL_COMMUNITY): Payer: Self-pay | Admitting: Physician Assistant

## 2024-11-10 DIAGNOSIS — F1090 Alcohol use, unspecified, uncomplicated: Secondary | ICD-10-CM

## 2024-11-10 DIAGNOSIS — Z634 Disappearance and death of family member: Secondary | ICD-10-CM

## 2024-11-10 DIAGNOSIS — F1020 Alcohol dependence, uncomplicated: Secondary | ICD-10-CM

## 2024-11-10 DIAGNOSIS — F411 Generalized anxiety disorder: Secondary | ICD-10-CM

## 2024-11-10 DIAGNOSIS — F339 Major depressive disorder, recurrent, unspecified: Secondary | ICD-10-CM

## 2024-11-10 DIAGNOSIS — F1010 Alcohol abuse, uncomplicated: Secondary | ICD-10-CM | POA: Diagnosis not present

## 2024-11-10 DIAGNOSIS — G4700 Insomnia, unspecified: Secondary | ICD-10-CM | POA: Diagnosis not present

## 2024-11-10 MED ORDER — FLUOXETINE HCL 20 MG PO CAPS: 60.0000 mg | ORAL_CAPSULE | Freq: Every day | ORAL | 1 refills | Status: AC

## 2024-11-10 MED ORDER — QUETIAPINE FUMARATE 100 MG PO TABS: 100.0000 mg | ORAL_TABLET | Freq: Two times a day (BID) | ORAL | 0 refills | Status: AC

## 2024-11-10 MED ORDER — GABAPENTIN 300 MG PO CAPS: 300.0000 mg | ORAL_CAPSULE | Freq: Three times a day (TID) | ORAL | 1 refills | Status: AC

## 2024-11-10 MED ORDER — TRAZODONE HCL 300 MG PO TABS: 300.0000 mg | ORAL_TABLET | Freq: Every day | ORAL | 1 refills | Status: AC

## 2024-11-10 MED ORDER — HYDROXYZINE HCL 50 MG PO TABS: 100.0000 mg | ORAL_TABLET | Freq: Three times a day (TID) | ORAL | 1 refills | Status: AC | PRN

## 2024-11-10 MED ORDER — QUETIAPINE FUMARATE 400 MG PO TABS: 400.0000 mg | ORAL_TABLET | Freq: Every day | ORAL | 1 refills | Status: AC
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
## 2024-11-14 ENCOUNTER — Other Ambulatory Visit: Payer: Self-pay

## 2024-11-15 ENCOUNTER — Other Ambulatory Visit: Payer: Self-pay | Admitting: Family Medicine

## 2024-11-16 ENCOUNTER — Telehealth: Payer: Self-pay

## 2024-11-16 ENCOUNTER — Ambulatory Visit (INDEPENDENT_AMBULATORY_CARE_PROVIDER_SITE_OTHER)

## 2024-11-16 DIAGNOSIS — F1090 Alcohol use, unspecified, uncomplicated: Secondary | ICD-10-CM
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
## 2024-11-16 NOTE — Telephone Encounter (Signed)
 Copied from CRM [id number]. Topic: Clinical - Request for Lab/Test Order >> Nov 16, 2024  3:34 PM Drema MATSU wrote: Reason for CRM: Pt is requesting a full metabolic panel and she would like it done at 02/05 visit. Pt is taking a class and they are concerned about her weight.

## 2024-11-17 ENCOUNTER — Telehealth (HOSPITAL_COMMUNITY): Payer: Self-pay

## 2024-11-17 NOTE — Telephone Encounter (Signed)
 noted

## 2024-11-17 NOTE — Telephone Encounter (Signed)
 Placed a call to [facility] after consulting with Carlin Emmer, PA-C regarding his review of her medical chart. Charles advised for [name] to get a complete Metabolic Panel and he would need to review the results before considering her for admission to [facility].  The call went to voice mail and therapist left a HIPAA compliant message requesting a return call.  [name], MS, LMFT, LCAS 11-16-24

## 2024-11-17 NOTE — Telephone Encounter (Signed)
 Carla Martinez calls and asks if Carla Emmer, PA-C is going to admit her prior to getting a complete metabolic panel.  Thiis therapist explains that Carla said she may consider going to an Urgent Care and also said she would need to establish a weight within a normal range.  Carla Martinez compares her weight to this therapist noting this therapist is thin.  Carla Martinez says this is unfair and she is ordered by DWI assessment to  complete 90 days of [facility] IOP. She asks to speak to this therapist's supervisor.  Therapist contacts Carla Martinez, Clinical Nurse Manager via email and asks if he would call Carla Martinez.  Carla Simpler, MS, LMFT, LCAS

## 2024-11-17 NOTE — Telephone Encounter (Signed)
 Therapist receives an incoming call from Carla Martinez and therapist asks for her last name and date of birth which she provides for HIPAA compliance.  This therapist discusses with Carla Martinez that Charles Kober, PA-C is advising that she get a completed metabolic panel and provide it for his review prior to considering admission to [facility]. Carla Martinez notes that something strange is happening with her phone that it does not ring on man incoming calls and asks this therapist to leave a message if the phone does not ring.  Carla Martinez says she will call her MD.  Carla Martinez calls back and says her MD can not see her until December 29, 2024 and inquires if she can attend [facility] until that time. Therapist explains she will need to check with Carlin Emmer, PA-C.  Therapist messages Carlin Emmer, PA-C and tells him of Carla Martinez's request.Charles advises for pt to go to an Urgent Care to obtain the Complete Metabolic Panel.  Charles comments that Carla Martinez would also need to achieve a normal body weight prior to considering for admission to [facility] IOP  This therapist places a call to Carla Martinez to communicate this information, the phone does not ring, but goes straight to voice mail.  Therapist leaves a HIPAA compliant message requesting a return call.  Carla Simpler, MS, LMFT, LCAS

## 2024-11-18 ENCOUNTER — Telehealth (HOSPITAL_COMMUNITY): Payer: Self-pay

## 2024-11-18 ENCOUNTER — Telehealth (HOSPITAL_COMMUNITY): Payer: Self-pay | Admitting: Licensed Clinical Social Worker

## 2024-11-18 ENCOUNTER — Ambulatory Visit (HOSPITAL_COMMUNITY)

## 2024-11-18 NOTE — Telephone Encounter (Unsigned)
 Copied from CRM #[id number]. Topic: Clinical - Request for Lab/Test Order >> Nov 18, 2024 12:32 PM Carla Martinez wrote: Reason for CRM: Patient requests orders for lab work ,and requests for those to be put in and scheduled before visit on 11-24-24, please call patient back. And needs a rush on this.

## 2024-11-18 NOTE — Telephone Encounter (Signed)
 The therapist returns Carla Martinez's call leaving a HIPAA-compliant voicemail informing her that this therapist's Supervisor also attempted to reach her today leaving a message as well. She is advised to not to come to class before speaking to this therapist or her Supervisor as, in her message, she asked about being able to attend group Monday though she has not gotten the labs she was asked to get completed, completed.  [name], MA, LCSW, [facility], LCAS 11/18/2024

## 2024-11-18 NOTE — Telephone Encounter (Signed)
 Advice Only - Telephone message left for patient on her identifiable voice mail, to follow up on pt's request to have a supervisor call her back. Informed patient of this nurse manager's office phone number and left a message informing patient that she would need to obtain requested lab work and a medical follow up before she would be considered for the CDIOP at the [facility].  Informed patient that anyone who is referred to the CDIOP must first come in for an evaluation that would most likely include a review of their medical status, possible needed labs and would also need to be at the stage of wanting SUD treatment to help stop, not just to meet a court requirement.  Informed patient if she was still interested in potentially starting the CDIOP she would need to have the requested lab work and medical follow up and then could set up another time to meet with Zell Prim, LCSW, [facility], LCAS to complete her full assessment.  Informed after that, it would still be up to our physician assistant, who is a chemical addition specialist if they felt patient would be a good fit for the group, if she met criteria, and if she is medically stable enough to be in the program.  Left this nurse managers desk phone number at the [facility] if she would like to call back to discuss or had any other questions.

## 2024-11-18 NOTE — Telephone Encounter (Signed)
 Note in error.

## 2024-11-18 NOTE — Telephone Encounter (Signed)
 Carla Martinez calls yesterday and leaves a message asking this therapist if she had spoke to her supervsor, Carla Martinez, Higher Education Careers Adviser.  This therapist calls this a.m. and Carla Martinez's phone goes straight to voice mail. Therapist leaves a HIPAA complaint message requesting a return call.  [name], MS, LMFT, LCAS

## 2024-11-20 LAB — BASIC METABOLIC PANEL WITH GFR
BUN/Creatinine Ratio: 10 — ABNORMAL LOW (ref 12–28)
BUN: 11 mg/dL (ref 8–27)
CO2: 25 mmol/L (ref 20–29)
Calcium: 9.5 mg/dL (ref 8.7–10.3)
Chloride: 95 mmol/L — ABNORMAL LOW (ref 96–106)
Creatinine, Ser: 1.08 mg/dL — ABNORMAL HIGH (ref 0.57–1.00)
Glucose: 115 mg/dL — ABNORMAL HIGH (ref 70–99)
Potassium: 4 mmol/L (ref 3.5–5.2)
Sodium: 137 mmol/L (ref 134–144)
eGFR: 58 mL/min/{1.73_m2} — ABNORMAL LOW

## 2024-11-20 LAB — BRAIN NATRIURETIC PEPTIDE: BNP: 36.1 pg/mL (ref 0.0–100.0)

## 2024-11-21 ENCOUNTER — Encounter (HOSPITAL_COMMUNITY): Payer: Self-pay | Admitting: Licensed Clinical Social Worker

## 2024-11-21 ENCOUNTER — Ambulatory Visit (HOSPITAL_COMMUNITY)

## 2024-11-21 ENCOUNTER — Telehealth (HOSPITAL_COMMUNITY): Payer: Self-pay | Admitting: Licensed Clinical Social Worker

## 2024-11-21 NOTE — Telephone Encounter (Signed)
 The before noted mentioned a full metabolic panel. To me that is  CMP. What labs work is she requesting now?

## 2024-11-21 NOTE — Progress Notes (Signed)
 Kidney function has remained stable.  There is also no evidence of heart failure by her lab work.  Recommend that she continue her current medication regimen without changes at this time.  Recommend a follow-up appointment with an APP or primary cardiologist within the upcoming 2-3 weeks.

## 2024-11-21 NOTE — Telephone Encounter (Signed)
 The therapist calls Carla Martinez who indicates that she is in the middle of something with her mom but says it would be okay for this therapist to send her correspondence via MyChart adding that would be helpful.   [name], MA, LCSW, [facility], LCAS 11/21/2024

## 2024-11-22 ENCOUNTER — Telehealth: Payer: Self-pay | Admitting: Cardiology

## 2024-11-22 NOTE — Telephone Encounter (Signed)
 Just full metabolic panel for lab work.

## 2024-11-22 NOTE — Telephone Encounter (Signed)
 Called to schedule hospital fu patient refused.

## 2024-11-22 NOTE — Telephone Encounter (Signed)
-----   Message from Nurse Olivia HERO, RN sent at 11/21/2024  2:10 PM EST ----- Please schedule patient office visit in 2-3 weeks.

## 2024-11-22 NOTE — Telephone Encounter (Signed)
 We can get labs at her office visit

## 2024-11-23 ENCOUNTER — Ambulatory Visit (HOSPITAL_COMMUNITY)

## 2024-11-23 ENCOUNTER — Telehealth (HOSPITAL_COMMUNITY): Payer: Self-pay

## 2024-11-23 NOTE — Telephone Encounter (Signed)
 Patient called in requesting a letter from provider stating which specific labs are needed. Per patient, [facility] would not complete a second lab draw unless documentation for required testing is provided.

## 2024-11-23 NOTE — Telephone Encounter (Signed)
 [name], LCSW, [facility] [place]. LCAS asked this therapist to call [name] to inform her that Carlin Emmer wants her to call her doctor to ask them to add on aComplete Metabolic Panel as her Metabolic Panel was abnormal.  This therapist calls and leaves a HIPAA compliant message with [facility] requesting a return call.  [name], MS, LMFT, LCAS

## 2024-11-24 ENCOUNTER — Ambulatory Visit: Admitting: Nurse Practitioner

## 2024-11-24 ENCOUNTER — Ambulatory Visit
Admission: RE | Admit: 2024-11-24 | Discharge: 2024-11-24 | Disposition: A | Source: Ambulatory Visit | Attending: Nurse Practitioner

## 2024-11-24 ENCOUNTER — Telehealth (HOSPITAL_COMMUNITY): Payer: Self-pay

## 2024-11-24 VITALS — BP 100/60 | HR 63 | Temp 97.6°F | Ht 64.0 in | Wt 93.0 lb

## 2024-11-24 DIAGNOSIS — M25551 Pain in right hip: Secondary | ICD-10-CM

## 2024-11-24 DIAGNOSIS — E43 Unspecified severe protein-calorie malnutrition: Secondary | ICD-10-CM

## 2024-11-24 DIAGNOSIS — W19XXXA Unspecified fall, initial encounter: Secondary | ICD-10-CM

## 2024-11-24 DIAGNOSIS — Z09 Encounter for follow-up examination after completed treatment for conditions other than malignant neoplasm: Secondary | ICD-10-CM

## 2024-11-24 LAB — COMPREHENSIVE METABOLIC PANEL WITH GFR
ALT: 17 U/L (ref 3–35)
AST: 18 U/L (ref 5–37)
Albumin: 4.2 g/dL (ref 3.5–5.2)
Alkaline Phosphatase: 79 U/L (ref 39–117)
BUN: 10 mg/dL (ref 6–23)
CO2: 33 meq/L — ABNORMAL HIGH (ref 19–32)
Calcium: 9.7 mg/dL (ref 8.4–10.5)
Chloride: 98 meq/L (ref 96–112)
Creatinine, Ser: 1.09 mg/dL (ref 0.40–1.20)
GFR: 54.97 mL/min — ABNORMAL LOW
Glucose, Bld: 80 mg/dL (ref 70–99)
Potassium: 3.4 meq/L — ABNORMAL LOW (ref 3.5–5.1)
Sodium: 139 meq/L (ref 135–145)
Total Bilirubin: 0.2 mg/dL (ref 0.2–1.2)
Total Protein: 6.5 g/dL (ref 6.0–8.3)

## 2024-11-24 LAB — CBC
HCT: 37.9 % (ref 36.0–46.0)
Hemoglobin: 12.6 g/dL (ref 12.0–15.0)
MCHC: 33.4 g/dL (ref 30.0–36.0)
MCV: 88.7 fl (ref 78.0–100.0)
Platelets: 360 10*3/uL (ref 150.0–400.0)
RBC: 4.28 Mil/uL (ref 3.87–5.11)
RDW: 14.6 % (ref 11.5–15.5)
WBC: 7.6 10*3/uL (ref 4.0–10.5)

## 2024-11-24 LAB — TSH: TSH: 3.36 u[IU]/mL (ref 0.35–5.50)

## 2024-11-24 NOTE — Telephone Encounter (Signed)
 Attempted to call [name] again to communicate the information Carlin Emmer, PA-C had asked to be communicated to her. Ronn's phone went straight to voice mail and this therapist leaves a HIPAA compliant message requesting a return call.  [name], MS, LMFT, LCAS

## 2024-11-24 NOTE — Progress Notes (Signed)
 "  Established Patient Office Visit  Subjective   Patient ID: Carla Martinez, female    DOB: 1961-02-11  Age: 64 y.o. MRN: [id number]  Chief Complaint  Patient presents with   Hospitalization Follow-up    Pt complains of falling again on ice. States she feels like screws are coming loose in hip and states of increasing pain. Pt also complains of ongoing back pain.     HPI  Patient went to the hospital on 10/30/2024 for multiple complaints.  Her most pressing matter was having more tremors with generalized weakness with possible falling?  Patient was found to have mild AKI received IV fluids.  Troponin trended down.  Initially she was able to ambulate within developed jerking movements of bilateral lower extremities with unclear etiology.  She was admitted to the hospital for observation.  Patient was discharged on 10/31/2024.  Patient refused the possibility of SNF placement.  They discontinued patient's hydroxyzine  continued patient's metoprolol  and she is here for follow-up.  Discussed the use of AI scribe software for clinical note transcription with the patient, who gave verbal consent to proceed.  History of Present Illness Carla Martinez is a 64 year old female who presents for follow-up after recent hospitalization for decreased kidney function and issues with walking and movements.  She was hospitalized on February 11th due to decreased kidney function, as revealed by blood work. During her stay, she received fluids, and a CT scan of the head and a chest x-ray were performed. She was discharged on February 12th.  Since discharge, she reports improvement but continues to experience falls, particularly on ice, and has had episodes of jerking or tremors. She recently fell while going to the mailbox and while trying to reach an [place], but did not hit her head.  She has been in communication regarding blood work, specifically a complete metabolic panel, and mentions that  her thiamine  levels were high, while magnesium  and CK levels were normal.  She reports fluctuating weight, with home measurements showing between 94 to 97 pounds. She does not allow herself to be weighed at the clinic. She has a history of an endoscopy in 2024, which showed some reddened tissue but was otherwise unremarkable.  She is currently maintained on Protonix  40 mg daily  She is currently taking Protonix  for her stomach issues. She reports a lack of appetite, eating minimally, and sometimes using Nyquil to aid sleep. She continues to see a psychiatry group for mental health support.  No new falls at home outside of those on ice. No head injuries from recent falls. She confirms taking Nyquil for sleep and denies alcohol consumption outside of Nyquil use.      Review of Systems  Constitutional:  Negative for chills and fever.  Respiratory:  Negative for shortness of breath.   Cardiovascular:  Negative for chest pain.  Musculoskeletal:  Positive for falls and joint pain.      Objective:     BP 100/60   Pulse 63   Temp 97.6 F (36.4 C) (Oral)   Ht 5' 4 (1.626 m)   Wt 93 lb (42.2 kg)   SpO2 99%   BMI 15.96 kg/m  BP Readings from Last 3 Encounters:  11/24/24 100/60  10/31/24 (!) 140/74  10/07/24 90/60   Wt Readings from Last 3 Encounters:  11/24/24 93 lb (42.2 kg)  10/30/24 93 lb (42.2 kg)  10/07/24 95 lb 5 oz (43.2 kg)   SpO2 Readings from Last 3 Encounters:  11/24/24 99%  10/31/24 100%  10/07/24 99%      Physical Exam Vitals and nursing note reviewed.  Constitutional:      Appearance: Normal appearance.  Cardiovascular:     Rate and Rhythm: Normal rate and regular rhythm.     Heart sounds: Normal heart sounds.  Pulmonary:     Effort: Pulmonary effort is normal.     Breath sounds: Normal breath sounds.  Musculoskeletal:        General: Tenderness present.     Right hip: Bony tenderness present. No deformity. Normal range of motion. Normal strength.      Comments: Bilateral lower extremity strength 5/5 Patellar reflex 1+ bilaterally   Neurological:     Mental Status: She is alert.      No results found for any visits on 11/24/24.    The ASCVD Risk score (Arnett DK, et al., 2019) failed to calculate for the following reasons:   Risk score cannot be calculated because patient has a medical history suggesting prior/existing ASCVD   * - Cholesterol units were assumed    Assessment & Plan:   Problem List Items Addressed This Visit       Other   Right hip pain   Relevant Orders   DG Hip Unilat W OR W/O Pelvis 2-3 Views Right   Hospital discharge follow-up - Primary   Relevant Orders   CBC   Comprehensive metabolic panel with GFR   Protein-calorie malnutrition, severe   Relevant Orders   CBC   Comprehensive metabolic panel with GFR   TSH   Other Visit Diagnoses       Fall, initial encounter       Relevant Orders   DG Hip Unilat W OR W/O Pelvis 2-3 Views Right      Assessment and Plan Assessment & Plan Hip pain and repeated falls Recent fall on ice with hip pain localized to the side of the hip, exacerbated by movement. - Ordered hip x-ray. - Performed complete metabolic panel. - Scheduled follow-up in one month.  Unintentional weight loss with poor appetite Weight fluctuates between 93 to 97 pounds. Poor appetite and minimal food intake. Previous endoscopy in 2024 showed reddened tissue, but no significant findings. CT scan suggested possible gastritis. - Continue Protonix . - Monitor weight and appetite. - Scheduled follow-up in one month. - Did review endoscopy from 2024.  Did review most recent CT scan of abdomen pelvis October 2025.  Hospital follow-up -Reviewed ED and inpatient discharge note.  Reviewed most recent labs and imaging  Return in about 4 weeks (around 12/22/2024) for weight recheck .    Adina Crandall, NP  "

## 2024-11-25 ENCOUNTER — Ambulatory Visit (HOSPITAL_COMMUNITY)

## 2024-11-28 ENCOUNTER — Ambulatory Visit (HOSPITAL_COMMUNITY)

## 2024-11-30 ENCOUNTER — Ambulatory Visit (HOSPITAL_COMMUNITY)

## 2024-12-02 ENCOUNTER — Ambulatory Visit (HOSPITAL_COMMUNITY)

## 2024-12-05 ENCOUNTER — Ambulatory Visit (HOSPITAL_COMMUNITY)

## 2024-12-07 ENCOUNTER — Ambulatory Visit (HOSPITAL_COMMUNITY)

## 2024-12-09 ENCOUNTER — Ambulatory Visit (HOSPITAL_COMMUNITY)

## 2024-12-12 ENCOUNTER — Ambulatory Visit (HOSPITAL_COMMUNITY)

## 2024-12-14 ENCOUNTER — Ambulatory Visit (HOSPITAL_COMMUNITY)

## 2024-12-16 ENCOUNTER — Ambulatory Visit (HOSPITAL_COMMUNITY)

## 2024-12-22 ENCOUNTER — Ambulatory Visit: Admitting: Nurse Practitioner

## 2024-12-22 ENCOUNTER — Telehealth (HOSPITAL_COMMUNITY): Admitting: Physician Assistant
